# Patient Record
Sex: Male | Born: 1950
Health system: Southern US, Community
[De-identification: ages and names within clinical notes are randomized; demographics above are authoritative.]

## PROBLEM LIST (undated history)

## (undated) DIAGNOSIS — E785 Hyperlipidemia, unspecified: Secondary | ICD-10-CM

## (undated) DIAGNOSIS — R7303 Prediabetes: Secondary | ICD-10-CM

## (undated) DIAGNOSIS — I10 Essential (primary) hypertension: Secondary | ICD-10-CM

## (undated) DIAGNOSIS — I517 Cardiomegaly: Secondary | ICD-10-CM

## (undated) DIAGNOSIS — K5790 Diverticulosis of intestine, part unspecified, without perforation or abscess without bleeding: Secondary | ICD-10-CM

## (undated) DIAGNOSIS — F419 Anxiety disorder, unspecified: Secondary | ICD-10-CM

## (undated) DIAGNOSIS — R9431 Abnormal electrocardiogram [ECG] [EKG]: Secondary | ICD-10-CM

## (undated) DIAGNOSIS — Z8546 Personal history of malignant neoplasm of prostate: Secondary | ICD-10-CM

## (undated) DIAGNOSIS — T7840XA Allergy, unspecified, initial encounter: Secondary | ICD-10-CM

## (undated) HISTORY — DX: Diverticulosis of intestine, part unspecified, without perforation or abscess without bleeding: K57.90

## (undated) HISTORY — DX: Abnormal electrocardiogram (ECG) (EKG): R94.31

## (undated) HISTORY — DX: Anxiety disorder, unspecified: F41.9

## (undated) HISTORY — DX: Allergy, unspecified, initial encounter: T78.40XA

## (undated) HISTORY — DX: Hyperlipidemia, unspecified: E78.5

## (undated) HISTORY — DX: Cardiomegaly: I51.7

## (undated) HISTORY — PX: COLONOSCOPY: SHX174

## (undated) HISTORY — DX: Personal history of malignant neoplasm of prostate: Z85.46

---
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
## 1999-03-10 HISTORY — PX: EYE SURGERY: SHX253

## 1999-10-14 ENCOUNTER — Ambulatory Visit (HOSPITAL_COMMUNITY): Admission: RE | Admit: 1999-10-14 | Discharge: 1999-10-14 | Payer: Self-pay | Admitting: Ophthalmology

## 2000-02-24 ENCOUNTER — Ambulatory Visit (HOSPITAL_COMMUNITY): Admission: RE | Admit: 2000-02-24 | Discharge: 2000-02-24 | Payer: Self-pay | Admitting: Ophthalmology

## 2001-03-09 HISTORY — PX: PROSTATE SURGERY: SHX751

## 2001-11-29 ENCOUNTER — Encounter: Payer: Self-pay | Admitting: Urology

## 2001-12-05 ENCOUNTER — Encounter (INDEPENDENT_AMBULATORY_CARE_PROVIDER_SITE_OTHER): Payer: Self-pay | Admitting: Specialist

## 2001-12-05 ENCOUNTER — Inpatient Hospital Stay (HOSPITAL_COMMUNITY): Admission: RE | Admit: 2001-12-05 | Discharge: 2001-12-08 | Payer: Self-pay | Admitting: Urology

## 2002-04-13 ENCOUNTER — Encounter (INDEPENDENT_AMBULATORY_CARE_PROVIDER_SITE_OTHER): Payer: Self-pay | Admitting: Specialist

## 2002-04-13 ENCOUNTER — Ambulatory Visit (HOSPITAL_COMMUNITY): Admission: RE | Admit: 2002-04-13 | Discharge: 2002-04-13 | Payer: Self-pay | Admitting: Gastroenterology

## 2002-10-03 ENCOUNTER — Ambulatory Visit (HOSPITAL_COMMUNITY): Admission: RE | Admit: 2002-10-03 | Discharge: 2002-10-03 | Payer: Self-pay | Admitting: Ophthalmology

## 2002-10-26 ENCOUNTER — Ambulatory Visit (HOSPITAL_COMMUNITY): Admission: RE | Admit: 2002-10-26 | Discharge: 2002-10-26 | Payer: Self-pay | Admitting: Ophthalmology

## 2005-10-09 ENCOUNTER — Ambulatory Visit: Payer: Self-pay | Admitting: Family Medicine

## 2005-10-16 ENCOUNTER — Ambulatory Visit: Payer: Self-pay | Admitting: Family Medicine

## 2005-12-29 ENCOUNTER — Ambulatory Visit: Payer: Self-pay | Admitting: Family Medicine

## 2006-11-24 ENCOUNTER — Ambulatory Visit: Payer: Self-pay | Admitting: Family Medicine

## 2007-02-18 ENCOUNTER — Ambulatory Visit (HOSPITAL_BASED_OUTPATIENT_CLINIC_OR_DEPARTMENT_OTHER): Admission: RE | Admit: 2007-02-18 | Discharge: 2007-02-18 | Payer: Self-pay | Admitting: Urology

## 2007-10-03 ENCOUNTER — Ambulatory Visit: Payer: Self-pay | Admitting: Family Medicine

## 2008-10-04 ENCOUNTER — Ambulatory Visit: Payer: Self-pay | Admitting: Family Medicine

## 2009-03-09 HISTORY — PX: PENILE PROSTHESIS IMPLANT: SHX240

## 2009-06-20 LAB — HM COLONOSCOPY: HM Colonoscopy: NORMAL

## 2009-07-23 ENCOUNTER — Ambulatory Visit: Payer: Self-pay | Admitting: Family Medicine

## 2009-10-16 ENCOUNTER — Ambulatory Visit: Payer: Self-pay | Admitting: Family Medicine

## 2009-11-13 ENCOUNTER — Ambulatory Visit: Payer: Self-pay | Admitting: Family Medicine

## 2009-11-13 ENCOUNTER — Ambulatory Visit: Payer: Self-pay | Admitting: Cardiovascular Disease

## 2010-05-23 ENCOUNTER — Ambulatory Visit: Payer: Self-pay | Admitting: Cardiovascular Disease

## 2010-06-19 ENCOUNTER — Other Ambulatory Visit: Payer: Self-pay | Admitting: *Deleted

## 2010-06-19 ENCOUNTER — Telehealth: Payer: Self-pay | Admitting: Cardiovascular Disease

## 2010-06-19 DIAGNOSIS — E785 Hyperlipidemia, unspecified: Secondary | ICD-10-CM

## 2010-06-19 NOTE — Telephone Encounter (Signed)
Patient rescheduled appointment, does he need lab work.  Please call patient.  If needed please place order.

## 2010-06-25 ENCOUNTER — Encounter: Payer: Self-pay | Admitting: Cardiovascular Disease

## 2010-06-25 DIAGNOSIS — I517 Cardiomegaly: Secondary | ICD-10-CM

## 2010-06-25 DIAGNOSIS — E785 Hyperlipidemia, unspecified: Secondary | ICD-10-CM | POA: Insufficient documentation

## 2010-06-26 ENCOUNTER — Encounter: Payer: Self-pay | Admitting: Cardiovascular Disease

## 2010-06-27 ENCOUNTER — Other Ambulatory Visit (INDEPENDENT_AMBULATORY_CARE_PROVIDER_SITE_OTHER): Payer: PRIVATE HEALTH INSURANCE | Admitting: *Deleted

## 2010-06-27 ENCOUNTER — Ambulatory Visit (INDEPENDENT_AMBULATORY_CARE_PROVIDER_SITE_OTHER): Payer: PRIVATE HEALTH INSURANCE | Admitting: Cardiovascular Disease

## 2010-06-27 ENCOUNTER — Encounter: Payer: Self-pay | Admitting: Cardiovascular Disease

## 2010-06-27 VITALS — BP 110/70 | HR 53 | Ht 68.0 in | Wt 177.4 lb

## 2010-06-27 DIAGNOSIS — R9431 Abnormal electrocardiogram [ECG] [EKG]: Secondary | ICD-10-CM

## 2010-06-27 DIAGNOSIS — E785 Hyperlipidemia, unspecified: Secondary | ICD-10-CM

## 2010-06-27 DIAGNOSIS — I10 Essential (primary) hypertension: Secondary | ICD-10-CM | POA: Insufficient documentation

## 2010-06-27 LAB — BASIC METABOLIC PANEL
CO2: 29 mEq/L (ref 19–32)
GFR: 84.29 mL/min (ref >60.00)
Glucose, Bld: 96 mg/dL (ref 70–99)
Potassium: 4.2 mEq/L (ref 3.5–5.1)
Sodium: 141 mEq/L (ref 135–145)

## 2010-06-27 LAB — HEPATIC FUNCTION PANEL: Total Bilirubin: 0.8 mg/dL (ref 0.3–1.2)

## 2010-06-27 LAB — LIPID PANEL
HDL: 52.1 mg/dL (ref >39.00)
LDL Cholesterol: 86 mg/dL (ref 0–99)
Total CHOL/HDL Ratio: 3
VLDL: 22.6 mg/dL (ref 0.0–40.0)

## 2010-06-27 MED ORDER — LISINOPRIL 10 MG PO TABS: 10.0000 mg | ORAL_TABLET | Freq: Every day | ORAL | Status: DC

## 2010-06-27 MED ORDER — HYDROCHLOROTHIAZIDE 12.5 MG PO CAPS: 12.5000 mg | ORAL_CAPSULE | Freq: Every day | ORAL | Status: DC

## 2010-06-27 NOTE — Progress Notes (Signed)
Terry Hays Date of Birth  09-21-1950 [facility] / [facility] [address]     [address] [place], [place]  [address] [phone]  Fax  [phone]  History of Present Illness:  Terry Hays is a middle-aged gentleman with a history of hypertension, left ventricular hypertrophy, Hypercholesterolemia and an abnormal EKG.  He's done very well since I last saw him one year ago. He still plays basketball on a regular basis. He's been saying little bit better hydrated and has noted that he is not having nearly as much dizziness. His energy levels have also been better.  Current Outpatient Prescriptions on File Prior to Visit  Medication Sig Dispense Refill  . aspirin 325 MG tablet Take 325 mg by mouth daily.        Marland Kitchen atorvastatin (LIPITOR) 10 MG tablet Take 40 mg by mouth daily.       . Multiple Vitamin (MULTIVITAMIN) tablet Take 1 tablet by mouth daily.        . potassium chloride (KLOR-CON) 10 MEQ CR tablet Take 10 mEq by mouth daily.        Marland Kitchen DISCONTD: hydrochlorothiazide (,MICROZIDE/HYDRODIURIL,) 12.5 MG capsule Take 12.5 mg by mouth daily.        Marland Kitchen DISCONTD: lisinopril (PRINIVIL,ZESTRIL) 10 MG tablet Take 10 mg by mouth daily.         No Known Allergies  Past Medical History  Diagnosis Date  . LVH (left ventricular hypertrophy)   . Dyslipidemia   . Abnormal ECG     History reviewed. No pertinent past surgical history.  History  Smoking status  . Former Smoker  . Quit date: 03/09/1976  Smokeless tobacco  . Not on file    History  Alcohol Use  . 0.6 oz/week  . 1 Glasses of wine per week    Family History  Problem Relation Age of Onset  . Heart attack    . Hyperlipidemia    . Cancer      Reviw of Systems:  Reviewed in the HPI.  All other systems are negative.  Physical Exam: BP 110/70  Pulse 53  Ht 5\' 8"  (1.727 m)  Wt 177 lb 6.4 oz (80.468 kg)  BMI 26.97 kg/m2 The patient is alert and oriented x 3.  The mood and affect are normal.   The skin is warm and dry.  Color is normal.  The HEENT exam reveals that the sclera are nonicteric.  The mucous membranes are moist.  The carotids are 2+ without bruits.  There is no thyromegaly.  There is no JVD.  The lungs are clear.  The chest wall is non tender.  The heart exam reveals a regular rate with a normal S1 and S2.  There are no murmurs, gallops, or rubs.  The PMI is not displaced.   Abdominal exam reveals good bowel sounds.  There is no guarding or rebound.  There is no hepatosplenomegaly or tenderness.  There are no masses.  Exam of the legs reveal no clubbing, cyanosis, or edema.  The legs are without rashes.  The distal pulses are intact.  Cranial nerves II - XII are intact.  Motor and sensory functions are intact.  The gait is normal.  ECG: Sinus bradycardia. He has T wave inversions in the inferior and lateral leads which Assessment / Plan:

## 2010-06-27 NOTE — Assessment & Plan Note (Signed)
We have drawn a lipid level, basic metabolic profile, and hepatic profile today. I'll see him again in one year for follow up visit with the same labs. He is to continue with the Lipitor.

## 2010-06-27 NOTE — Assessment & Plan Note (Signed)
Terry Hays seems to be doing quite well from a blood pressure standpoint. His blood pressure is normal today. He's staying well-hydrated and is not having any episodes of orthostasis. I've refilled his lisinopril and his HCTZ.

## 2010-07-12 ENCOUNTER — Encounter: Payer: Self-pay | Admitting: Family Medicine

## 2010-07-17 ENCOUNTER — Telehealth: Payer: Self-pay | Admitting: *Deleted

## 2010-07-17 NOTE — Telephone Encounter (Signed)
Pt stated his results weren't sent and he didn't hear the results. Mailed copy today,Jodette Warden/ranger

## 2010-07-22 NOTE — Op Note (Signed)
NAME:  Terry Hays, Terry Hays NO.:  [id number]   MEDICAL RECORD NO.:  [id number]          Hays TYPE:  AMB   LOCATION:  [facility]                         FACILITY:  [facility]   PHYSICIAN:  Bertram Millard. Dahlstedt, M.D.DATE OF BIRTH:  [birth year]   DATE OF PROCEDURE:  02/18/2007  DATE OF DISCHARGE:                               OPERATIVE REPORT   PREOPERATIVE DIAGNOSIS:  Erectile dysfunction, organic.   POSTOPERATIVE DIAGNOSIS:  Erectile dysfunction, organic.   PRINCIPAL PROCEDURE:  Placement of three piece inflatable penile  prosthesis, AMS, Ultrex.   SURGEON:  Bertram Millard. Dahlstedt, M.D.   ANESTHESIA:  General with LMA.   COMPLICATIONS:  None.   BRIEF HISTORY:  Terry Hays is Terry 60 year old Hays status post radical  prostatectomy over 4 years ago.  He has been cancer free, but has  significant erectile dysfunction.  He has been reliant on injections for  years.  These are not working quite well enough and Terry Hays is  tiring of them.  He has been talking for Terry couple of years of having an  IPP placed.  He has received significant counseling on this.  Over Terry  past year so, he has been wanting to schedule this.  At this last visit  we had an extensive talk regarding this.  Terry risks and complications of  Terry procedure were discussed with Terry Hays including but not limited  to mechanical malfunction, need for replacement, infection, bleeding,  among others.  He understands these and desires to proceed.   DESCRIPTION OF PROCEDURE:  Terry Hays was identified in Terry holding  area.  He received both preoperative gentamicin and Ancef.  He was taken  to Terry operating room where general anesthetic was administered.  He was  placed in Terry supine position.  Lower abdomen, genitalia and perineum  were scrubbed for 10 minutes and then prepped.  Sterile drapes were  placed.  His urethra was catheterized and his bladder drained with Terry 16  [place] Foley.  An infrapubic incision was then  made approximately 4 cm  long.  Dissection was carried down to Terry rectus fascia superiorly into  Terry base of Terry penis inferiorly.  Terry tunica albuginea of Terry penis was  dissected circumferentially in Terry proximal aspect of Terry anterior  penis.  Terry rectus fascia was divided in Terry midline with electrocautery  and gentle dissection was carried down to Terry space of Retzius.  Terry  space of Retzius was bluntly dissected such that it could easily  accommodate Terry 2 ounce reservoir.  Stay sutures were placed laterally in  Terry proximal corporal bodies with 2 stay sutures placed bilaterally.  In  between these corporotomies were made longitudinally, approximately 1.5  cm in length.  Dissection was carried gently within each corporal body  with Terry Strully scissors.  They were then dilated proximally and  distally up to 13 size using Terry dilators.  Terry dilation was easy.  There was no significant fibrotic reaction.  Terry penis was sized to 19.5  cm with Terry measuring device.  At first an 18 cm implant with Terry 2 cm  exterior extender was placed.  This was felt to be too big, and when I  was removing Terry left cylinder to place Terry 1.5 cm extender, Terry surface  of Terry cylinder was ruptured.  We needed to replace this with Terry 15 cm  cylinder with Terry 4.5 cm rear tip extender.  These were placed  bilaterally.  They fit quite nicely when Terry cylinders were pumped up.  At this point corporotomies were closed with 2-0 Vicryl sutures in an  interrupted fashion.  Terry sub dartos pouch deep into Terry inferior aspect  of Terry right hemiscrotum was created with Terry towel forceps.  Terry pump  was then placed in Terry dependent position in Terry right hemiscrotum.  Terry  good seat was achieved.   At this point Terry reservoir was placed in Terry space of Retzius.  It was  filled with 62 mL of saline and there was no back pressure on Terry  syringe when it was filled.  Terry fascia was closed using Terry running 2-0  Vicryl.  At this point Terry  only connection between Terry tubing from Terry  pump to Terry reservoir was made after Terry tubing was cut appropriately.  Following this, Terry operated area was irrigated copiously with  antibiotic solution.  No significant bleeding was seen coming from  either corporotomy which had been closed with Terry right hemiscrotum.  Terry  tubing was buried in Terry subcutaneous pouch off to Terry right of Terry  incision.  Terry subcutaneous tissue was then closed using Terry running 2-0  Vicryl.  Skin edges were reapproximated using Terry running 4-0 Monocryl and  placed in Terry running subcuticular fashion.  Steri-Strips, Terry Telfa and Terry  Tegaderm dressing was placed.  After Terry Hays was cleansed, Terry  scrotum was dressed with Terry jock strap and fluffs.  Terry Foley catheter  was left in and hooked to dependent drainage.   Terry Hays tolerated Terry procedure well.  He was then taken to Terry PACU  in stable condition.   He will be discharged on Percocet 5/325, #25, 1 p.o. q.4 h p.r.n. pain  as well as Keflex 500 mg 1 p.o. q.8 h #15.  He will follow up in  approximately 1 week.  Activity restrictions were filled out.      Bertram Millard. Dahlstedt, M.D.  Electronically Signed     SMD/MEDQ  D:  02/18/2007  T:  02/19/2007  Job:  [id number]   cc:   Sharlot Gowda, M.D.  Fax: [phone]

## 2010-07-25 NOTE — Op Note (Signed)
NAME:  RECTOR, DEVONSHIRE                       ACCOUNT NO.:  [id number]   MEDICAL RECORD NO.:  [id number]                   PATIENT TYPE:  AMB   LOCATION:  ENDO                                 [facility]   PHYSICIAN:  Petra Kuba, M.D.                 DATE OF BIRTH:  [birth year]   DATE OF PROCEDURE:  04/13/2002  DATE OF DISCHARGE:                                 OPERATIVE REPORT   PROCEDURE:  Colonoscopy with polypectomy.   INDICATIONS FOR PROCEDURE:  A patient with some constipation since his  prostate surgery. Family history of colon polyps, colon cancer, due for  colonic screening. Consent was signed after risks, benefits, methods, and  options were thoroughly discussed in the office.   MEDICINES USED:  Demerol 90, Versed 9.   DESCRIPTION OF PROCEDURE:  Rectal inspection was pertinent for external  hemorrhoids. Digital exam was negative. The pediatric video adjustable  colonoscope was inserted, easily advanced to the proximal level of the  hepatic flexure. At that point, a small polyp was seen which was snared.  Unfortunately before we could apply cautery, we cut through the polyp and it  was suctioned through the scope and collected in the trap. There was really  minimal bleeding so we elected to advance to the cecum which required  rolling him on his back and some abdominal pressure. No other abnormalities  were seen on insertion. The cecum was identified by the appendiceal orifice  and the ileocecal valve. The prep was adequate. There was some liquid stool  that required washing and suctioning. The scope was slowly withdrawn. In the  ascending colon, a small polyp was seen and was hot biopsied. We withdrew  back to the previous polypectomy site and no obvious residual polypoid  tissue was seen but we went ahead and took one hot biopsy of that area. Also  in the hepatic flexure, another small polyp was seen and was hot biopsied  x1. As the slow was slowly withdrawn, a  few scattered tiny polyps were seen.  We took additional hot biopsies in the distal transverse, descending and  sigmoid. There was a rare occasional left sided diverticula seen. Once back  in the rectum, another small polyp was seen and we elected to snare this  one. Electrocautery was applied and the polyp was suctioned through the  scope and collected in the trap. Also in the rectum was some minimal  inflammation over what seemed to be the prostate area but no obvious mass,  lesion or anything worrisome that required biopsy. The scope was retroflexed  revealing some internal hemorrhoids. The scope was straightened and  readvanced a short ways up the left side of the colon, air was suctioned,  scope removed. The patient tolerated the procedure well and there was no  obvious or immediate complication.   ENDOSCOPIC DIAGNOSIS:  1. Internal and external hemorrhoids.  2. Occasional left sided  diverticula.  3. Minimal rectal inflammation presumably over the prostate from his     previous surgery.  4. Multiple tiny to small polyps with snares in the hepatic flexure and the     rectum and hot biopsies in the ascending, transverse, descending and     sigmoid.  5. Otherwise within normal limits to the cecum.    PLAN:  Await pathology to determine future colonic screening. Probably  recheck in three check. Happy to see back p.r.n., otherwise, return care to  Dr. Excell Seltzer for the customary health care maintenance to include yearly  rectals and guaiacs.                                               Petra Kuba, M.D.    MEM/MEDQ  D:  04/13/2002  T:  04/13/2002  Job:  [id number]   cc:   Christella Noa, M.D.  [address] [place]  [place], [place] [address]  Fax: [phone]

## 2010-07-25 NOTE — Op Note (Signed)
NAME:  Terry Hays, Terry Hays                       ACCOUNT NO.:  [id number]   MEDICAL RECORD NO.:  [id number]                   PATIENT TYPE:  INP   LOCATION:  X002                                 [facility]ACILITY:  [facility]   PHYSICIAN:  Bertram Millard. Dahlstedt, M.D.          DATE OF BIRTH:  01-25-1951   DATE OF PROCEDURE:  12/05/2001  DATE OF DISCHARGE:                                 OPERATIVE REPORT   PREOPERATIVE DIAGNOSIS:  Adenocarcinoma of the prostate.   POSTOPERATIVE DIAGNOSIS:  Adenocarcinoma of the prostate.   PROCEDURES:  1. Radical retropubic prostatectomy.  2. Bilateral lymphadenectomy.   SURGEON:  Bertram Millard. Retta Diones, M.D.   ASSISTANT:  Crecencio Mc, M.D.   ANESTHESIA:  General.   ESTIMATED BLOOD LOSS:  2000 cc.   INTRAVENOUS FLUIDS:  6900 cc of crystalloid.   COMPLICATIONS:  Rectal injury which was primarily repaired.   SPECIMENS:  1. Prostate with bilateral seminal vesicals.  2. Bilateral obturator lymph nodes.   INDICATION:  This is a 60 year old gentleman, who was initially seen and  evaluated for lower urinary tract symptoms.  The patient also was noted to  have an elevated PSA which was 4.7.  A repeat PSA was found to be 6.17.  He  subsequently underwent prostate biopsy and was found to have Gleason 3 + 3 =  6 in approximately 10% of the tissue on the left-sided biopsies.  In  addition, the patient was noted to a have prostate gland of approximately 90  cc.  He was therefore placed on Proscar at that time.  After discussing  treatment options with the patient, he elected to proceed with surgical  therapy.  Therefore, potential risks and benefits of radical retropubic  prostatectomy were discussed with the patient, and he consented.   DESCRIPTION OF PROCEDURE:  The patient was taken to the operating room, and  a general anesthetic was administered.  The patient was given preoperative  antibiotics, placed in the supine position, and prepped and draped in the  usual  sterile fashion.  Next a 24 French Foley catheter was inserted into  the bladder, and the bladder was drained.  The #10 blade was then used to  make a lower midline abdominal incision from just below the umbilicus down  to the pubic symphysis.  This was then carried down through the subcutaneous  tissues with Bovie electrocautery.  The anterior rectus fascia was  identified and divided in the midline.  The rectus muscles were then  separated in the midline, and the transversalis fascia was opened, thus  exposing the space of Retzius.  The space was then developed on either side  of the bladder in preparation for a lymphadenectomy.  A self-retracting  Bookwalter was placed and pelvic lymphadenectomy was begun.  Attention was  first turned to the obturator lymph nodes on the left side which were  removed with sharp and blunt dissection as necessary with care to clip any  vessels or lymphatics.  Care was also taken not to injure the obturator  nerve.  Lymphadenectomy was also performed on the contralateral side in a  similar fashion.  Attention then turned to the prostatectomy.  The  endopelvic fascia was identified on either side of the prostate and sharply  entered with Metzenbaum scissors.  The levator fibers were then bluntly  dissected from the prostate.  The endopelvic fascia was taken down with  electrocautery, and the puboprostatic ligaments were then identified.  The  Kitner dissector was used to tease the overlying fat away, and the  puboprostatics were sharply divided just under the pubic bone.  Attention  then turned to controlling the dorsal venous complex.  A Hohenfellner was  placed between the anterior urethra and the dorsal venous  complex, and two  #1 Vicryl ties were placed on the dorsal vein.  In addition, a 2-0 Vicryl  suture ligature was placed over the dorsal vein.  An additional 2-0 Vicryl  was also placed proximally as a backbleeding stitch.  The dorsal venous  complex  was then taken down with electrocautery.  The anterior urethra was  thus exposed and was sharply divided with Metzenbaum scissors, thereby  exposing the Foley catheter.  A right-angle was placed around the catheter,  and it was cut distally and brought into the incision, thereby exposing the  posterior urethra.  The posterior urethra was also sharply divided with  Metzenbaum scissors, and the space between the anterior rectal fascia and  Denonvillier's fascia was bluntly separated.  A good plane did not easily  develop.  Attention turned toward taking down some of the lateral pedicle  tissue, thereby exposing the space between the prostate and rectum.  At this  point, it was noted that there was yellowish-brown material in the wound.  Inspection revealed a small rectal injury.  Of note, the patient had  received a bowel prep prior to the procedure.  A dose of Flagyl 500 mg was  administered to the patient at this time, and continuation of the  prostatectomy ensued.  The lateral pedicle tissue was taken down in a  piecemeal fashion with a right-angle.  This tissue was then clipped and  divided as the prostate was dissected free.  Attention then turned to the  bladder neck which was carefully dissected using a Vanderbilt clamp.  The  bladder neck was able to be nicely dissected free and once isolated, it was  divided with a #15 blade.  It was first divided anteriorly, and the trigone  of the bladder was then identified and was seen to be well away from the  bladder neck.  Therefore, the posterior urethra was also divided sharply,  thereby severing the prostate and the bladder.  Attention then turned to  dissecting out the vas deferens on either side.  A right-angle was used to  accomplish this, and the vas was then clipped and divided.  The seminal  vesicals were then also dissected free using a right-angle, and they were clipped toward the tip of the seminal vesicals and divided as well.   Specimen was then removed off the table, and the wound was inspected.  Any  additional bleeding sites were controlled to achieve good hemostasis.  The  rectal injury was again examined and appeared to be proximal to where the  urethral anastomosis was to be performed.  Since the patient had had an  adequate bowel prep, it was decided to perform a primary closure of  this.  Therefore, a 2-0 Vicryl stitch was used to close the rectal laceration.  In  addition, a 2-0 Vicryl was used to bring a serosal layer over the initial  layer.  There was also some available tissue just anterior to this which was  also brought together with a 2-0 Vicryl to separate the rectal laceration  from the urethral anastomosis.  Attention then turned to reconstruction of  the bladder neck.  The bladder neck was everted with interrupted 4-0 chromic  sutures.  A 2-0 chromic suture was used to reconstruct the posterior bladder  neck in a tennis racquet fashion so that it was of the appropriate size for  the anastomosis.  Then 2-0 PDS sutures were then used for the urethral  anastomosis.  A total of five anastomotic sutures were placed at the 12, 2,  5, 7, and 10 o'clock positions in the urethra and then brought through their  corresponding positions in the bladder neck.  Of note, copious antibiotic  irrigation was used to irrigate the wound following closure of the rectal  laceration.  The anastomotic sutures were then tied down, and the bladder  was irrigated and irrigated clear without any clots.  A #10 Blake drain was  then placed in the wound and brought out through a separate stab incision in  the left lower quadrant.  The fascia was then brought together with a #1  PDS, and the skin was reapproximated with a running 4-0 PDS subcuticular  stitch.  A dressing was placed, and the procedure was ended.  The patient appeared to tolerate the procedure well and was able to be  extubated and transferred to the recovery unit  in satisfactory condition.  Please note that Dr. Marcine Matar was the operating surgeon and was  present and participated for the entire procedure.     Crecencio Mc, M.D.                          Bertram Millard. Dahlstedt, M.D.    LB/MEDQ  D:  12/05/2001  T:  12/05/2001  Job:  [id number]   cc:   Talmadge Coventry, M.D.

## 2010-07-25 NOTE — Discharge Summary (Signed)
   NAME:  Terry Hays, DISPENZA NO.:  [id number]   MEDICAL RECORD NO.:  [id number]                   PATIENT TYPE:   LOCATION:                                       FACILITY:   PHYSICIAN:  Bertram Millard. Dahlstedt, M.D.          DATE OF BIRTH:   DATE OF ADMISSION:  12/05/2001  DATE OF DISCHARGE:  12/08/2001                                 DISCHARGE SUMMARY   PRINCIPAL DIAGNOSES:  1. Adenocarcinoma of the prostate, pathologic stage T2a, NO, MX, Gleason     6/10 (3+3).  2. Hypercholesterolemia.  3. Benign prostatic hypertrophy.   PRINCIPAL PROCEDURE:  December 05, 2001, radical prostatectomy, bilateral  pelvic lymph node dissection, repair of small rectal laceration.   BRIEF HISTORY:  This 60 year old gentleman has adenocarcinoma of the  prostate.  This was discovered upon evaluation for an elevated PSA.  The  patient has a 60- to 70-gram gland.  He was counseled regarding treatment  strategies and has chosen radical prostatectomy.  He was admitted for that  purpose.   ADMISSION LABORATORY DATA:  Admission laboratories were all normal.   EKG normal.   Chest x-ray normal.   HOSPITAL COURSE:  The patient was admitted directly to the operating room  where he underwent radical retropubic prostatectomy, bilateral pelvic lymph  node dissection.  A small rectal laceration was noted during the procedure  and was closed primarily.  He was placed on a clear liquid diet and  eventually advanced to full liquids.  He did well postoperatively and was  discharged home on postoperative day #3.  He did have a low hematocrit which  was followed without transfusion.  He was discharged home on postoperative  day #3.  At that time he was eating a full liquid diet and was discharged  home with catheter in place.  JP drain had been removed.  He will follow up  in my office in approximately one week.   DISCHARGE MEDICATIONS:  Oxybutynin for bladder spasms.  Cipro and Flagyl.  Vicodin for pain.  He was also told to take a stool softener.   DISCHARGE INSTRUCTIONS:  He will advance his diet at home.  Activity  restrictions were discussed in depth with the patient.                                                Bertram Millard. Dahlstedt, M.D.   SMD/MEDQ  D:  01/11/2002  T:  01/11/2002  Job:  [id number]   cc:   Talmadge Coventry, M.D.

## 2010-07-25 NOTE — Op Note (Signed)
[place]  Patient:    Terry Hays, Terry Hays                    MRN: [id number] Proc. Date: 02/24/00 Adm. Date:  [id number] Attending:  Ivor Messier CC:         Vincenza Hews, M.D.  Heather Roberts, M.D.   Operative Report  PREOPERATIVE DIAGNOSIS:  Posterior subcapsular cataract, left eye.  POSTOPERATIVE DIAGNOSIS:  Posterior subcapsular cataract, left eye.  OPERATION:  Planned extracapsular cataract extraction, with primary insertion of posterior chamber chamber intraocular lens implant.  SURGEON:  Guadelupe Sabin, M.D.  ASSISTANT:  Nurse.  ANESTHESIA:  Local 4% Xylocaine, 0.75% Marcaine, retrobulbar block, topical tetracaine, intraocular Xylocaine.  The patient was given sodium pentothal intravenously during the period of retrobulbar block.  DESCRIPTION OF PROCEDURE:  After the patient was prepped and draped, a lid speculum was inserted in the left eye.  The eye was turned downward and a superior rectus traction suture was placed.  Schiotz tonometry was recorded at 8-9 scale units with a 5.5 g weight.  A peritomy was performed adjacent to the limbus from the 11 to the 1 oclock position.  The corneal-scleral junction was cleaned, and a corneal-scleral groove made with a 45-degree Super blade. The anterior chamber was then entered with the 2.5 mm diamond keratome at the 12 oclock position, and the 15-degree blade at the 2:30 position.  Using a bent #26 gauge needle, a circular capsular rhexis was performed.  The capsule was extremely thick and a small fragment was cut with the Vannas scissors to complete the circular capsular rhexis.  Hydrodissection and hydrodelineation were performed using 1% Xylocaine.  The 30-degree phacoemulsification tip was then inserted, with slow controlled emulsification of the lens nucleus.  The total ultrasonic time was 32 seconds.   Average power level was 9%.  Total amount of fluid used was 45 cc.   Following the removal of the nucleus, the residual cortex and posterior subcapsular plaque were aspirated with the irrigation aspiration tip.  The posterior capsule itself appeared intact, with a brilliant red fundus reflex.  It was therefore elected to insert an [employer] S40NB silicone posterior chamber intraocular lens implant with UV absorber, diopter strength +18.50.  This was inserted with the McDonald forceps into the anterior chamber, and then centered into the capsular bag using the [facility] lens rotator.  The lens appeared to be well-centered.  The Healon which had been used during the procedure was then aspirated and replaced with balanced salt solution and Miochol ophthalmic solution.  The operative incisions appeared to be self-sealing, and no sutures were required. A light patch and protector shield, along with Maxitrol ointment were all applied to the operated left eye.  The duration of the procedure and the anesthesia administration was 45 minutes.  The patient tolerated the procedure well in general, and left the operating room for the recovery room in good condition. DD:  02/24/00 TD:  02/24/00 Job: [id number]

## 2010-07-25 NOTE — Op Note (Signed)
[place]  Patient:    Terry Hays, Terry Hays                    MRN: [id number] Proc. Date: 10/14/99 Adm. Date:  [id number] Attending:  Ivor Messier CC:         Vincenza Hews, M.D.             Heather Roberts, M.D.                           Operative Report  PREOPERATIVE DIAGNOSIS:  Posterior subcapsular cataract, right eye.  POSTOPERATIVE DIAGNOSIS:  Posterior subcapsular cataract, right eye.  OPERATION:  Planned extracapsular cataract extraction  - phacoemulsification, primary insertion of posterior chamber intraocular lens implant.  SURGEON:  Guadelupe Sabin, M.D.  ASSISTANT:  Nurse.  ANESTHESIA:  Local 4% Xylocaine, 0.75% Marcaine, retrobulbar block, topical tetracaine, intraoperative Xylocaine.  DESCRIPTION OF PROCEDURE:  After the patient was prepped and draped with suitable local anesthesia, a lid speculum was inserted in the right eye. Schiotz tonometry was recorded at 7 scale units with a 5.5 g weight.  A peritomy was performed adjacent to the limbus from the 11 to 1 oclock position.  The corneal-scleral junction was cleaned, and a corneal-scleral groove made with a 45-degree Super blade.  The anterior chamber was then entered with a 2.5 mm diamond keratome at the 12 oclock position, and the 15-degree blade at the 2:30 position.  Using a bent #26 gauge needle on a Healon syringe, an anterior capsulotomy was begun.  The anterior capsule itself was extremely difficult to perforate.  A capsular rhexis, however, was completed.  Hydrodissection and hydrodelineation were performed using 1% Xylocaine. The 30-degree phacoemulsification tip was then inserted with slow controlled emulsification of the rather soft nucleus.  Total ultrasonic time was 45 seconds.  Average power level 12%.  Amount of fluid used during the emulsification was 55 cc.  The posterior capsular plaque easily was removed from the posterior capsular surface.   The posterior capsule appeared intact with a brilliant red fundus reflex.  It was therefore elected to insert an [employer] SI40NB silicone posterior chamber intraocular lens implant with UV absorber, diopter strength +18.00.  This was inserted with a McDonald forceps into the anterior chamber, and then centered into the capsular bag using the [facility] lens rotator.  Following this, the Healon which had been used during the procedure was removed.  There suddenly appeared to be oozing blood from the 2 oclock position.  Miochol ophthalmic solution was used to constrict the pupil.  Balanced salt solution was used to irrigate the anterior chamber.  The irrigation/aspiration tip was again inserted into the anterior chamber, and the anterior chamber cleared, but there appeared to be continued oozing, perhaps related to the patients chronic aspirin administration, although he stated that he had discontinued it a few days prior to surgery.  It was elected to close the 12 oclock incision with a single #10-0 interrupted nylon suture.  There continued to be slight oozing of the blood in the anterior chamber, and the anterior chamber pressure was elevated slightly for a few moments, to cause hopefully the blood to stop bleeding.  There did, however, remain some diffuse blood in the anterior chamber which obscured anterior segment details.  It was felt, however, that continued irrigation was not improving the situation, and it was elected to close, hoping that the blood would spontaneously  stop bleeding.  Maxitrol ointment was instilled in the conjunctival cul-de-sac, and a light patch and protector shield applied.  Duration of the procedure and anesthesia administration 45 minutes.  The patient tolerated the procedure well in general and left the operating room for the recovery room in good condition. DD:  10/14/99 TD:  10/14/99 Job: [id number]

## 2010-07-25 NOTE — H&P (Signed)
[place]  Patient:    Terry Hays, Terry Hays                    MRN: [id number] Adm. Date:  [id number] Attending:  Ivor Messier CC:         Vincenza Hews, M.D. - & Lab data, EKG, chest x-ray  Heather Roberts, M.D.   History and Physical  CHIEF COMPLAINT:  This was a planned outpatient readmission of this 60 year old white male, admitted for cataract implant surgery of the left eye.  HISTORY OF PRESENT ILLNESS:  This patient had been noted by his regular ophthalmologist, Dr. Vincenza Hews, to have progressive posterior subcapsular cataract formation in both eyes.  The patient was previously admitted to this hospital on October 14, 1999, where cataract implant surgery was performed.  The patient did well following this surgery, with a return of vision to 20/20, without correction.  The patient was eager to proceed with similar cataract implant surgery of the left eye.  He signed an informed consent and arrangements were made for his outpatient admission at this time.  PAST MEDICAL HISTORY:  See the old chart.  He is in stable general health.  REVIEW OF SYSTEMS:  No cardiorespiratory complaints.  PHYSICAL EXAMINATION:  VITAL SIGNS:  As recorded on admission, blood pressure 133/80, pulse 60, respirations 18, temperature 97.6 degrees.  GENERAL:  The patient is a pleasant well-developed, well-nourished white male, in no acute distress.  HEENT:  Eyes:  Visual acuity 20/20 right eye, 20/30 left eye, with best correction.  Slit lamp examination reveals a posterior subcapsular, and a dense plaque in the center of the pupil of the left eye.  CHEST:  Lungs clear to percussion and auscultation.  HEART:  A normal sinus rhythm, no cardiomegaly, no murmurs.  ABDOMEN:  Negative.  EXTREMITIES:  Negative.  ADMISSION DIAGNOSES: 1. Posterior subcapsular cataract, left eye. 2. Pseudophakia, right eye.  SURGICAL PLAN:  Cataract implant  surgery, left eye. DD:  02/24/00 TD:  02/24/00 Job: [id number]

## 2010-07-25 NOTE — H&P (Signed)
[place]  Patient:    Terry Hays, Terry Hays                    MRN: [id number] Adm. Date:  [id number] Attending:  Ivor Messier CC:         Vincenza Hews, M.D.             Heather Roberts, M.D.                         History and Physical  CHIEF COMPLAINT:  This was a planned outpatient surgical admission of this 60 year old white male admitted for cataract implant surgery of the right eye.  HISTORY OF PRESENT ILLNESS:  This patient had noted the gradual onset of blurred vision in the right eye, especially on sunny or dim days, and was found by Dr. Vincenza Hews, his regular ophthalmologist, to have cataract formation in both eyes, right eye worse than left eye.  It was felt that cataract implant surgery would be helpful, even though visual acuity as recorded on the chart was not significantly reduced.  The patient was referred to my office for cataract surgery.  Initial examination there revealed a visual acuity without correction of 20/60 right eye, 20/100 left eye, and with correction 20/40 right eye, and 20/20 left eye with correction.  Slit lamp examination showed a posterior subcapsular and nuclear cataract formation in both eyes.  Fundus examination dilated with Mydriacyl was normal.  It was felt that this patient was having considerable difficulty with his subjective vision, and that cataract surgery was indicated, due to the posterior subcapsular location of the cataract.  PAST MEDICAL HISTORY:  The patient is under the care of Dr. Heather Roberts. He is felt by Dr. Orson Aloe to be of a low risk for the proposed surgery.  FAMILY HISTORY:  The patient states that his family history is significant for heart attacks occurring at an early age.  CURRENT MEDICATIONS:  The patient has been taking aspirin up until a few days prior to surgery, and Lipitor, under Dr. Arvella Nigh care.  REVIEW OF SYSTEMS:  No cardiorespiratory  complaints.  PHYSICAL EXAMINATION:  VITAL SIGNS:  Blood pressure 111/62, temperature 96.5 degrees, pulse 54, respirations 20.  GENERAL:  The patient is a healthy well-developed, well-nourished white male, in no acute distress.  HEENT:  Eyes:  Ocular examination as noted above.  CHEST:  Lungs clear to percussion and auscultation.  HEART:  Normal sinus rhythm, no cardiomegaly, no murmurs.  ABDOMEN:  Negative.  EXTREMITIES:  Negative.  ADMISSION DIAGNOSES:  Posterior subcapsular cataract, both eyes.  SURGICAL PLAN:  Planned extracapsular cataract extraction with primary insertion of posterior chamber intraocular lens implant. DD:  10/14/99 TD:  10/14/99 Job: [id number]

## 2010-07-25 NOTE — H&P (Signed)
NAME:  Terry Hays, Terry Hays                       ACCOUNT NO.:  [id number]   MEDICAL RECORD NO.:  [id number]                   PATIENT TYPE:  INP   LOCATION:  NA                                   FACILITY:  [facility]   PHYSICIAN:  Bertram Millard. Dahlstedt, M.D.          DATE OF BIRTH:  November 12, 1950   DATE OF ADMISSION:  12/05/2001  DATE OF DISCHARGE:                                HISTORY & PHYSICAL   REASON FOR ADMISSION:  Prostate cancer.   HISTORY OF PRESENT ILLNESS:  The patient is a 60 year old gentleman who was  referred to me approximately two months ago for evaluation and management of  urinary symptoms and elevated PSA.  The patient's IPSS score at the time was  22.  His PSA was 4.7, and on repeat in the office was 6.17.  He underwent  ultrasound and biopsy on 10/03/01.  It found that the patient had a glandular  volume of 90 cc.  He had adenocarcinoma on 10% on the biopsy tissue on the  left with Gleason 6:10 (3+3).  Since that time, he has been on Flomax and  also has been placed on Proscar.  His urinary symptoms have improved  tremendously.   With the patient's diagnosis of prostate cancer, treatment options were  discussed with him.  This 60 year old gentleman is healthy and has treatment  only for hypercholesterolemia.  Treatment options, including watchful  waiting, cryotherapy, radiation, hormonal ablation, and surgery were  discussed.  Due to the patient's age and health status, surgery was the  primary recommendation.  Risks and complications of all treatment strategies  were discussed with the patient and his wife in depth.  After consideration,  they have chosen to have surgery.  He presents at this time for that  procedure.   PAST MEDICAL HISTORY:  Hypercholesterolemia.   PAST SURGICAL HISTORY:  1. Bilateral cataract surgery.  2. Herniorrhaphy.   MEDICATIONS:  1. Lipitor 10 mg q.d.  2. Aspirin, recently discontinued.  3. Flomax.  4. Proscar.   SOCIAL HISTORY:  He  is married and has two children.  He stopped smoking in  1977.  He drinks Cognac nightly.  He plays basketball regularly.  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  He is a native of [place], [place].   FAMILY HISTORY:  Significant for lung cancer, heart disease, hypertension,  and melanoma.   REVIEW OF SYMPTOMS:  Significant for occasional constipation.  He has recent  urinary symptoms which are improved.   PHYSICAL EXAMINATION:  GENERAL:  A vigorous, healthy-appearing adult male.  VITAL SIGNS:  As recorded in chart.  HEENT:  Normal.  NECK:  Supple without thyromegaly or adenopathy.  CHEST:  Clear bilaterally.  HEART:  Regular rate and rhythm.  ABDOMEN:  Flat, soft, nontender, nondistended, no hepatosplenomegaly or  masses.  GENITOURINARY:  Phallus without lesions, fibrotic areas, or plaques.  Testicles down bilaterally.  Normal shape, size, and consistency.  Perianal, perineal, and scrotal skin  are unremarkable.  Normal anal sphincter tone.  Glands 3+.  No tenderness,  no nodularity, no rectal mass.  SV is nonpalpable.  EXTREMITIES:  Peripheral vascular examination is normal.  NEUROLOGIC:  Grossly intact.   LABORATORY DATA:  EKG, chest x-ray, and other laboratory admission data are  pending at the time of dictation.   IMPRESSION:  1. Prostate cancer, clinic stage T1C, Gleason 6:10 (3+3).  The patient is     admitted for radical prostatectomy.  2. Benign prostatic hypertrophy, treated with alpha blockers and Proscar.  3. Hypercholesterolemia.   PLAN:  Admit for surgery.                                                  Bertram Millard. Dahlstedt, M.D.    SMD/MEDQ  D:  12/02/2001  T:  12/02/2001  Job:  [id number]   cc:   Talmadge Coventry, M.D.

## 2010-08-14 ENCOUNTER — Other Ambulatory Visit: Payer: Self-pay | Admitting: *Deleted

## 2010-08-14 ENCOUNTER — Telehealth: Payer: Self-pay | Admitting: *Deleted

## 2010-08-14 ENCOUNTER — Encounter: Payer: Self-pay | Admitting: Family Medicine

## 2010-08-14 NOTE — Telephone Encounter (Signed)
Done

## 2010-11-30 ENCOUNTER — Other Ambulatory Visit: Payer: Self-pay | Admitting: Family Medicine

## 2010-12-10 ENCOUNTER — Telehealth: Payer: Self-pay | Admitting: Family Medicine

## 2010-12-10 NOTE — Telephone Encounter (Signed)
Renew his Xanax 

## 2010-12-11 ENCOUNTER — Other Ambulatory Visit: Payer: Self-pay

## 2010-12-11 MED ORDER — ALPRAZOLAM 0.25 MG PO TABS: 0.2500 mg | ORAL_TABLET | Freq: Every evening | ORAL | Status: DC | PRN

## 2010-12-11 NOTE — Telephone Encounter (Signed)
Called in med per jcl 

## 2010-12-11 NOTE — Telephone Encounter (Signed)
Called med in 

## 2010-12-15 LAB — POCT HEMOGLOBIN-HEMACUE
Hemoglobin: 15.7
Operator id: 118191

## 2011-01-04 ENCOUNTER — Other Ambulatory Visit: Payer: Self-pay | Admitting: Cardiology

## 2011-01-05 ENCOUNTER — Other Ambulatory Visit: Payer: Self-pay | Admitting: Family Medicine

## 2011-01-06 ENCOUNTER — Telehealth: Payer: Self-pay | Admitting: Family Medicine

## 2011-01-06 NOTE — Telephone Encounter (Signed)
Pt is schedule next Thursday the 8th for a med check

## 2011-01-12 NOTE — Telephone Encounter (Signed)
1 WK OLD.  CLOSED ENCOUNTER

## 2011-01-15 ENCOUNTER — Encounter: Payer: Self-pay | Admitting: Family Medicine

## 2011-01-15 ENCOUNTER — Ambulatory Visit (INDEPENDENT_AMBULATORY_CARE_PROVIDER_SITE_OTHER): Payer: PRIVATE HEALTH INSURANCE | Admitting: Family Medicine

## 2011-01-15 DIAGNOSIS — I10 Essential (primary) hypertension: Secondary | ICD-10-CM

## 2011-01-15 DIAGNOSIS — E785 Hyperlipidemia, unspecified: Secondary | ICD-10-CM

## 2011-01-15 DIAGNOSIS — I517 Cardiomegaly: Secondary | ICD-10-CM

## 2011-01-15 MED ORDER — ATORVASTATIN CALCIUM 40 MG PO TABS: 40.0000 mg | ORAL_TABLET | Freq: Every day | ORAL | Status: DC

## 2011-01-15 NOTE — Progress Notes (Signed)
  Subjective:    Patient ID: Terry Hays, male    DOB: 12-30-1950, 60 y.o.   MRN: [id number]  HPI He is here for recheck. He continues on medications listed in the chart. He still does travel a lot and uses Xanax to help with her flying. He continues on Lipitor as well as his HCTZ and is having no difficulty with this. He continues to remain quite active and plays basketball regularly.   Review of Systems     Objective:   Physical Exam Alert and in no distress otherwise not examined       Assessment & Plan:   1. Dyslipidemia   2. Hypertension   3. LVH (left ventricular hypertrophy)    continue on present medications. Return here in several months for a complete exam.

## 2011-01-26 ENCOUNTER — Telehealth: Payer: Self-pay | Admitting: Family Medicine

## 2011-01-26 MED ORDER — ALPRAZOLAM 0.25 MG PO TABS: 0.2500 mg | ORAL_TABLET | Freq: Every evening | ORAL | Status: DC | PRN

## 2011-01-26 NOTE — Telephone Encounter (Signed)
Refilled xanax #[phone] with no refills.   

## 2011-01-26 NOTE — Telephone Encounter (Signed)
Renew his Xanax 

## 2011-04-14 ENCOUNTER — Ambulatory Visit (INDEPENDENT_AMBULATORY_CARE_PROVIDER_SITE_OTHER): Payer: PRIVATE HEALTH INSURANCE | Admitting: Medical

## 2011-04-14 ENCOUNTER — Encounter: Payer: Self-pay | Admitting: Medical

## 2011-04-14 VITALS — BP 140/70 | HR 62 | Temp 98.0°F | Resp 16 | Wt 180.0 lb

## 2011-04-14 DIAGNOSIS — K612 Anorectal abscess: Secondary | ICD-10-CM

## 2011-04-14 DIAGNOSIS — K611 Rectal abscess: Secondary | ICD-10-CM | POA: Insufficient documentation

## 2011-04-14 DIAGNOSIS — K644 Residual hemorrhoidal skin tags: Secondary | ICD-10-CM | POA: Insufficient documentation

## 2011-04-14 MED ORDER — SULFAMETHOXAZOLE-TRIMETHOPRIM 800-160 MG PO TABS: 1.0000 | ORAL_TABLET | Freq: Two times a day (BID) | ORAL | Status: AC

## 2011-04-14 MED ORDER — HYDROCORTISONE 2.5 % RE CREA: TOPICAL_CREAM | Freq: Two times a day (BID) | RECTAL | Status: DC

## 2011-04-14 NOTE — Patient Instructions (Signed)
Peri-Rectal Abscess Your caregiver has diagnosed you as having a SMALL peri-rectal abscess. This is an infected area near the rectum that is filled with pus. If the abscess is near the surface of the skin, your caregiver may open (incise) the area and drain the pus. HOME CARE INSTRUCTIONS   If your abscess was opened up and drained. A small piece of gauze may be placed in the opening so that it can drain. Do not remove the gauze unless directed by your caregiver.   A loose dressing may be placed over the abscess site. Change the dressing as often as necessary to keep it clean and dry.   After the drain is removed, the area may be washed with a gentle antiseptic (soap) four times per day.   A warm sitz bath, warm packs or heating pad may be used for pain relief, taking care not to burn yourself.   Return for a wound check in 1 day or as directed.   An "inflatable doughnut" may be used for sitting with added comfort. These can be purchased at a drugstore or medical supply house.   To reduce pain and straining with bowel movements, eat a high fiber diet with plenty of fruits and vegetables. Use stool softeners as recommended by your caregiver. This is especially important if narcotic type pain medications were prescribed as these may cause marked constipation.   Only take over-the-counter or prescription medicines for pain, discomfort, or fever as directed by your caregiver.  SEEK IMMEDIATE MEDICAL CARE IF:   You have increasing pain that is not controlled by medication.   There is increased inflammation (redness), swelling, bleeding, or drainage from the area.   An oral temperature above 102 F (38.9 C) develops.   You develop chills or generalized malaise (feel lethargic or feel "washed out").   You develop any new symptoms (problems) you feel may be related to your present problem.  Document Released: 02/21/2000 Document Revised: 11/05/2010 Document Reviewed: 02/21/2008 [facility]  Patient Information 2012 [place], [place].

## 2011-04-14 NOTE — Progress Notes (Signed)
Subjective:   HPI  Terry Hays is a 61 y.o. male who presents with sore bottom.  He is a very active man, exercises routinely, has been playing basketball recently. He notes last week when playing basketball, he ended up being blocked when shooting the ball and was thrown to the ground, landed on his bottom. This happened again a few days later. Since then he has had significant pain along his bottom around the anus area. Today he felt a pimple and saw some blood on tissue in the area that is painful and decided to come in to be evaluated.  He does have a history of hemorrhoids, uses his wife steroid cream for this occasionally. He does also note a history of boils mainly on his face and neck, but no prior boil on his bottom.  No other aggravating or relieving factors.    No other c/o.  The following portions of the patient's history were reviewed and updated as appropriate: allergies, current medications, past family history, past medical history, past social history, past surgical history and problem list.  Past Medical History  Diagnosis Date  . LVH (left ventricular hypertrophy)   . Dyslipidemia   . Abnormal ECG   . Allergy   . Anxiety     FLYING  . Hyperlipidemia   . Diverticulosis   . History of prostate cancer     No Known Allergies  Current Outpatient Prescriptions on File Prior to Visit  Medication Sig Dispense Refill  . ALPRAZolam (XANAX) 0.25 MG tablet Take 1 tablet (0.25 mg total) by mouth at bedtime as needed.  60 tablet  0  . aspirin 325 MG tablet Take 325 mg by mouth daily.        Marland Kitchen atorvastatin (LIPITOR) 40 MG tablet Take 1 tablet (40 mg total) by mouth daily.  30 tablet  5  . hydrochlorothiazide (,MICROZIDE/HYDRODIURIL,) 12.5 MG capsule Take 1 capsule (12.5 mg total) by mouth daily.  90 capsule  3  . KLOR-CON M10 10 MEQ tablet TAKE 1 TABLET EVERY DAY  30 tablet  5  . lisinopril (PRINIVIL,ZESTRIL) 10 MG tablet Take 1 tablet (10 mg total) by mouth daily.  90  tablet  3  . mometasone (NASONEX) 50 MCG/ACT nasal spray Place 2 sprays into the nose daily.           Review of Systems Constitutional: denies fever, chills, sweats Gastroenterology: denies abdominal pain, nausea, vomiting, diarrhea, constipation  Musculoskeletal: denies arthralgias, myalgias, joint swelling, back pain Urology: denies dysuria, difficulty urinating, hematuria, urinary frequency, urgency Neurology: no weakness, tingling, numbness      Objective:   Physical Exam  General appearance: alert, no distress, WD/WN, white male, lean Rectal: 1 left larger external hemorrhoids, but a few smaller left sided external hemorrhoids.  There is a small 4mm raised tender, lesion that is draining pus.  There is not induration, erythema or warmth.  Mainly tender and fluctuant with small drainage hole.  Otherwise, no obvious fistula, nontender on rectal exam, no mass or internal hemorrhoids palpated on DRE  Assessment and Plan :     Encounter Diagnoses  Name Primary?  . Perirectal abscess Yes  . External hemorrhoid    Very early abscess.  Script today for Bactrim, SITZ baths, and if not improving or worse in the next 2-3 days call/return.  Discussed possible complications and symptoms/signs to watch for that would suggest worse infection.    External hemorrhoids - discussed increased fiber and water in diet, script  today for Proctosol cream.   Call report 2-3 days.

## 2011-04-21 ENCOUNTER — Telehealth: Payer: Self-pay | Admitting: Family Medicine

## 2011-04-21 MED ORDER — ALPRAZOLAM 0.25 MG PO TABS: 0.2500 mg | ORAL_TABLET | Freq: Every evening | ORAL | Status: DC | PRN

## 2011-04-21 NOTE — Telephone Encounter (Signed)
Renew the Xanax

## 2011-04-21 NOTE — Telephone Encounter (Signed)
Called in xanax no refills

## 2011-05-29 ENCOUNTER — Other Ambulatory Visit: Payer: Self-pay | Admitting: Medical

## 2011-05-29 ENCOUNTER — Other Ambulatory Visit: Payer: Self-pay | Admitting: Family Medicine

## 2011-05-29 MED ORDER — ALPRAZOLAM 0.25 MG PO TABS: 0.2500 mg | ORAL_TABLET | Freq: Every evening | ORAL | Status: DC | PRN

## 2011-05-29 NOTE — Telephone Encounter (Signed)
[facility] 

## 2011-05-29 NOTE — Telephone Encounter (Signed)
Pt needs refill on xanax and proctosol-HC  .25% cream     [facility]

## 2011-05-29 NOTE — Telephone Encounter (Signed)
Call in Xanax  

## 2011-06-01 NOTE — Telephone Encounter (Signed)
RX REFILL 

## 2011-06-30 ENCOUNTER — Encounter: Payer: PRIVATE HEALTH INSURANCE | Admitting: Family Medicine

## 2011-07-02 ENCOUNTER — Other Ambulatory Visit: Payer: Self-pay | Admitting: Family Medicine

## 2011-07-02 ENCOUNTER — Other Ambulatory Visit: Payer: Self-pay

## 2011-07-02 MED ORDER — ALPRAZOLAM 0.25 MG PO TABS: 0.2500 mg | ORAL_TABLET | Freq: Every evening | ORAL | Status: DC | PRN

## 2011-07-02 NOTE — Telephone Encounter (Signed)
Pt needs xanax refill . Going OOT tomorrow/flying

## 2011-07-02 NOTE — Telephone Encounter (Signed)
Called med in per jcl 

## 2011-07-02 NOTE — Telephone Encounter (Signed)
Renew the Xanax

## 2011-07-02 NOTE — Telephone Encounter (Signed)
Med called in per jcl 

## 2011-07-07 ENCOUNTER — Other Ambulatory Visit: Payer: Self-pay | Admitting: *Deleted

## 2011-07-07 MED ORDER — POTASSIUM CHLORIDE CRYS ER 10 MEQ PO TBCR: 10.0000 meq | EXTENDED_RELEASE_TABLET | Freq: Every day | ORAL | Status: DC

## 2011-07-10 ENCOUNTER — Encounter: Payer: PRIVATE HEALTH INSURANCE | Admitting: Family Medicine

## 2011-07-17 ENCOUNTER — Other Ambulatory Visit: Payer: Self-pay

## 2011-07-17 MED ORDER — LISINOPRIL 10 MG PO TABS: 10.0000 mg | ORAL_TABLET | Freq: Every day | ORAL | Status: DC

## 2011-08-05 ENCOUNTER — Telehealth: Payer: Self-pay | Admitting: Internal Medicine

## 2011-08-05 MED ORDER — ALPRAZOLAM 0.25 MG PO TABS: 0.2500 mg | ORAL_TABLET | Freq: Every evening | ORAL | Status: DC | PRN

## 2011-08-05 NOTE — Telephone Encounter (Signed)
Called xanax in per jcl 

## 2011-08-05 NOTE — Telephone Encounter (Signed)
Okay to renew

## 2011-08-17 ENCOUNTER — Other Ambulatory Visit: Payer: Self-pay | Admitting: Family Medicine

## 2011-08-25 ENCOUNTER — Encounter: Payer: PRIVATE HEALTH INSURANCE | Admitting: Family Medicine

## 2011-09-14 ENCOUNTER — Other Ambulatory Visit: Payer: Self-pay | Admitting: *Deleted

## 2011-09-14 MED ORDER — POTASSIUM CHLORIDE CRYS ER 10 MEQ PO TBCR: 10.0000 meq | EXTENDED_RELEASE_TABLET | Freq: Every day | ORAL | Status: DC

## 2011-09-15 ENCOUNTER — Encounter: Payer: Self-pay | Admitting: Family Medicine

## 2011-09-15 ENCOUNTER — Other Ambulatory Visit: Payer: Self-pay

## 2011-09-15 ENCOUNTER — Ambulatory Visit (INDEPENDENT_AMBULATORY_CARE_PROVIDER_SITE_OTHER): Payer: PRIVATE HEALTH INSURANCE | Admitting: Family Medicine

## 2011-09-15 ENCOUNTER — Telehealth: Payer: Self-pay | Admitting: Cardiovascular Disease

## 2011-09-15 VITALS — BP 128/82 | HR 67 | Ht 67.0 in | Wt 179.0 lb

## 2011-09-15 DIAGNOSIS — F40243 Fear of flying: Secondary | ICD-10-CM

## 2011-09-15 DIAGNOSIS — Z8249 Family history of ischemic heart disease and other diseases of the circulatory system: Secondary | ICD-10-CM

## 2011-09-15 DIAGNOSIS — I10 Essential (primary) hypertension: Secondary | ICD-10-CM

## 2011-09-15 DIAGNOSIS — E785 Hyperlipidemia, unspecified: Secondary | ICD-10-CM

## 2011-09-15 DIAGNOSIS — F40298 Other specified phobia: Secondary | ICD-10-CM

## 2011-09-15 DIAGNOSIS — Z0000 Encounter for general adult medical examination without abnormal findings: Secondary | ICD-10-CM

## 2011-09-15 DIAGNOSIS — Z8546 Personal history of malignant neoplasm of prostate: Secondary | ICD-10-CM

## 2011-09-15 DIAGNOSIS — J309 Allergic rhinitis, unspecified: Secondary | ICD-10-CM

## 2011-09-15 DIAGNOSIS — K649 Unspecified hemorrhoids: Secondary | ICD-10-CM

## 2011-09-15 DIAGNOSIS — J302 Other seasonal allergic rhinitis: Secondary | ICD-10-CM

## 2011-09-15 LAB — CBC WITH DIFFERENTIAL/PLATELET
Basophils Absolute: 0 10*3/uL (ref 0.0–0.1)
Basophils Relative: 0 % (ref 0–1)
Hemoglobin: 17.1 g/dL — ABNORMAL HIGH (ref 13.0–17.0)
MCHC: 34.8 g/dL (ref 30.0–36.0)
Neutro Abs: 3.6 10*3/uL (ref 1.7–7.7)
Neutrophils Relative %: 61 % (ref 43–77)
Platelets: 224 10*3/uL (ref 150–400)
RDW: 14.5 % (ref 11.5–15.5)

## 2011-09-15 LAB — POCT URINALYSIS DIPSTICK
Bilirubin, UA: NEGATIVE
Leukocytes, UA: NEGATIVE
Nitrite, UA: NEGATIVE
Protein, UA: NEGATIVE
Urobilinogen, UA: NEGATIVE
pH, UA: 7

## 2011-09-15 LAB — COMPREHENSIVE METABOLIC PANEL
Alkaline Phosphatase: 64 U/L (ref 39–117)
Creat: 0.89 mg/dL (ref 0.50–1.35)
Glucose, Bld: 86 mg/dL (ref 70–99)
Sodium: 139 mEq/L (ref 135–145)
Total Bilirubin: 1 mg/dL (ref 0.3–1.2)
Total Protein: 6.5 g/dL (ref 6.0–8.3)

## 2011-09-15 LAB — LIPID PANEL
LDL Cholesterol: 112 mg/dL — ABNORMAL HIGH (ref 0–99)
Total CHOL/HDL Ratio: 3.8 Ratio
Triglycerides: 166 mg/dL — ABNORMAL HIGH (ref <150)
VLDL: 33 mg/dL (ref 0–40)

## 2011-09-15 MED ORDER — HYDROCHLOROTHIAZIDE 12.5 MG PO CAPS: 12.5000 mg | ORAL_CAPSULE | Freq: Every day | ORAL | Status: DC

## 2011-09-15 MED ORDER — MOMETASONE FUROATE 50 MCG/ACT NA SUSP: 2.0000 | Freq: Every day | NASAL | Status: DC

## 2011-09-15 MED ORDER — LISINOPRIL 10 MG PO TABS: 10.0000 mg | ORAL_TABLET | Freq: Every day | ORAL | Status: DC

## 2011-09-15 MED ORDER — ALPRAZOLAM 0.25 MG PO TABS: 0.2500 mg | ORAL_TABLET | Freq: Every evening | ORAL | Status: DC | PRN

## 2011-09-15 MED ORDER — ATORVASTATIN CALCIUM 40 MG PO TABS: 40.0000 mg | ORAL_TABLET | Freq: Every day | ORAL | Status: DC

## 2011-09-15 MED ORDER — HYDROCORTISONE 2.5 % RE CREA: TOPICAL_CREAM | Freq: Two times a day (BID) | RECTAL | Status: DC

## 2011-09-15 NOTE — Telephone Encounter (Signed)
New problem:  Per  Tammy,  Office is aware that Dr. Melburn Popper is in Cath lab today. Please review the EKG that was done in office today. EKG was scan into the epic system

## 2011-09-15 NOTE — Telephone Encounter (Signed)
[facility]ed [facility] rectal cream if there is a generic for this the patient would like this

## 2011-09-15 NOTE — Telephone Encounter (Signed)
EKG SAME AS WAS PRIOR 06/2010 DR LALOND INFORMED PER DR Elease Hashimoto

## 2011-09-15 NOTE — Progress Notes (Signed)
Subjective:    Patient ID: Terry Hays, male    DOB: 01-07-1951, 61 y.o.   MRN: [id number]  HPI He is here for complete examination. He continues to be quite active physically and with his work. He has no intentions of retiring. His home life is going well. He has had some difficulty with knee pain but is able to play basketball regularly. He recently had a tooth knocked out while playing ball. He does have difficulty with flying and uses Xanax to help with that. He also uses it to help him with sleep when he is traveling. He does have difficulty with hemorrhoids and would like medication. His immunizations are up to date. His family past medical and social history were reviewed  Review of Systems Negative except as above    Objective:   Physical Exam BP 128/82  Pulse 67  Ht 5\' 7"  (1.702 m)  Wt 179 lb (81.194 kg)  BMI 28.04 kg/m2  SpO2 97%  General Appearance:    Alert, cooperative, no distress, appears stated age  Head:    Normocephalic, without obvious abnormality, atraumatic  Eyes:    PERRL, conjunctiva/corneas clear, EOM's intact, fundi    benign  Ears:    Normal TM's and external ear canals  Nose:   Nares normal, mucosa normal, no drainage or sinus   tenderness  Throat:   Lips, mucosa, and tongue normal; teeth and gums normal  Neck:   Supple, no lymphadenopathy;  thyroid:  no   enlargement/tenderness/nodules; no carotid   bruit or JVD  Back:    Spine nontender, no curvature, ROM normal, no CVA     tenderness  Lungs:     Clear to auscultation bilaterally without wheezes, rales or     ronchi; respirations unlabored  Chest Wall:    No tenderness or deformity   Heart:    slightly irregular rhythm noted S1 and S2 normal, no murmur, rub   or gallop  Breast Exam:    No chest wall tenderness, masses or gynecomastia  Abdomen:     Soft, non-tender, nondistended, normoactive bowel sounds,    no masses, no hepatosplenomegaly     Rectal:    Normal sphincter tone, no masses or  tenderness; guaiac negative stool.  Prostate smooth, no nodules, not enlarged.  Extremities:   No clubbing, cyanosis or edema  Pulses:   2+ and symmetric all extremities  Skin:   Skin color, texture, turgor normal, no rashes or lesions  Lymph nodes:   Cervical, supraclavicular, and axillary nodes normal  Neurologic:   CNII-XII intact, normal strength, sensation and gait; reflexes 2+ and symmetric throughout          Psych:   Normal mood, affect, hygiene and grooming.    EKG did show what appeared to be PAC"s       Assessment & Plan:   1. Routine general medical examination at a health care facility  POCT Urinalysis Dipstick, PR ELECTROCARDIOGRAM, COMPLETE, Hemoccult - 1 Card (office), CBC with Differential, Comprehensive metabolic panel, Lipid panel  2. Dyslipidemia  Lipid panel  3. Hypertension    4. History of prostate cancer    5. Fear of flying  ALPRAZolam (XANAX) 0.25 MG tablet  6. Family history of heart disease in male family member before age 75    7. Allergic rhinitis, seasonal    8. Hemorrhoids     I will renew all of his medications for 90 days. We discussed his anxiety and sleep issues and  he is handling this well. I will have his cardiologist review the EKG.

## 2011-09-15 NOTE — Telephone Encounter (Signed)
ekg copied and will show Dr Elease Hashimoto this afternoon, paper chart obtained for review.

## 2011-09-17 ENCOUNTER — Telehealth: Payer: Self-pay

## 2011-09-17 NOTE — Telephone Encounter (Signed)
Let him know that I discussed this with his cardiologist and he is fine

## 2011-09-17 NOTE — Telephone Encounter (Signed)
Pt informed and is happy

## 2011-09-17 NOTE — Telephone Encounter (Signed)
Patient wants to know if his heart is all right he just wants to make sure

## 2011-11-10 ENCOUNTER — Other Ambulatory Visit: Payer: Self-pay | Admitting: Family Medicine

## 2011-11-10 NOTE — Telephone Encounter (Signed)
Is this ok?

## 2011-12-29 ENCOUNTER — Other Ambulatory Visit: Payer: Self-pay | Admitting: *Deleted

## 2011-12-29 MED ORDER — POTASSIUM CHLORIDE CRYS ER 10 MEQ PO TBCR: 10.0000 meq | EXTENDED_RELEASE_TABLET | Freq: Every day | ORAL | Status: DC

## 2012-02-12 ENCOUNTER — Other Ambulatory Visit: Payer: Self-pay

## 2012-02-12 MED ORDER — POTASSIUM CHLORIDE CRYS ER 10 MEQ PO TBCR: 10.0000 meq | EXTENDED_RELEASE_TABLET | Freq: Every day | ORAL | Status: DC

## 2012-03-08 ENCOUNTER — Other Ambulatory Visit: Payer: Self-pay | Admitting: Family Medicine

## 2012-03-08 NOTE — Telephone Encounter (Signed)
Rx refill on xanax.

## 2012-03-10 ENCOUNTER — Other Ambulatory Visit (INDEPENDENT_AMBULATORY_CARE_PROVIDER_SITE_OTHER): Payer: BC Managed Care – PPO

## 2012-03-10 ENCOUNTER — Other Ambulatory Visit: Payer: Self-pay

## 2012-03-10 DIAGNOSIS — F40243 Fear of flying: Secondary | ICD-10-CM

## 2012-03-10 DIAGNOSIS — Z23 Encounter for immunization: Secondary | ICD-10-CM

## 2012-03-10 MED ORDER — ALPRAZOLAM 0.25 MG PO TABS: 0.2500 mg | ORAL_TABLET | Freq: Every evening | ORAL | Status: DC | PRN

## 2012-03-10 NOTE — Telephone Encounter (Signed)
Renew this 

## 2012-03-10 NOTE — Telephone Encounter (Signed)
CALLED IN XANAX PER JCL 

## 2012-03-23 ENCOUNTER — Other Ambulatory Visit: Payer: Self-pay | Admitting: *Deleted

## 2012-03-29 ENCOUNTER — Other Ambulatory Visit: Payer: Self-pay | Admitting: Family Medicine

## 2012-03-30 ENCOUNTER — Other Ambulatory Visit: Payer: Self-pay | Admitting: *Deleted

## 2012-03-30 MED ORDER — POTASSIUM CHLORIDE CRYS ER 10 MEQ PO TBCR: 10.0000 meq | EXTENDED_RELEASE_TABLET | Freq: Every day | ORAL | Status: DC

## 2012-03-30 NOTE — Telephone Encounter (Signed)
Opened in Error.

## 2012-03-30 NOTE — Telephone Encounter (Signed)
NO REFILLS UNTIL APPOINTMENT Fax Received. Refill Completed. Elianys Conry Chowoe (R.M.A)   

## 2012-05-02 ENCOUNTER — Other Ambulatory Visit: Payer: Self-pay | Admitting: *Deleted

## 2012-05-02 ENCOUNTER — Ambulatory Visit: Payer: BC Managed Care – PPO | Admitting: Cardiovascular Disease

## 2012-05-10 ENCOUNTER — Encounter: Payer: Self-pay | Admitting: Cardiovascular Disease

## 2012-06-10 ENCOUNTER — Ambulatory Visit (INDEPENDENT_AMBULATORY_CARE_PROVIDER_SITE_OTHER): Payer: BC Managed Care – PPO | Admitting: Cardiovascular Disease

## 2012-06-10 ENCOUNTER — Encounter: Payer: Self-pay | Admitting: Cardiovascular Disease

## 2012-06-10 VITALS — BP 132/72 | HR 69 | Ht 67.0 in | Wt 179.0 lb

## 2012-06-10 DIAGNOSIS — I517 Cardiomegaly: Secondary | ICD-10-CM

## 2012-06-10 DIAGNOSIS — I10 Essential (primary) hypertension: Secondary | ICD-10-CM

## 2012-06-10 MED ORDER — POTASSIUM CHLORIDE CRYS ER 10 MEQ PO TBCR: 10.0000 meq | EXTENDED_RELEASE_TABLET | Freq: Every day | ORAL | Status: DC

## 2012-06-10 NOTE — Progress Notes (Signed)
Terry Hays Date of Birth  [birth year] [facility] [address]     [address] [place], [place]  [address] [phone]  Fax  [phone]  Problem List 1. Hypertension 2. LVH 3. Hyperlipidemia 4.   History of Present Illness:  Terry Hays is a middle-aged gentleman with a history of hypertension, left ventricular hypertrophy, Hypercholesterolemia and an abnormal EKG.  He's done very well since I last saw him one year ago. He still plays basketball on a regular basis. He's been saying little bit better hydrated and has noted that he is not having nearly as much dizziness. His energy levels have also been better.  June 10, 2012  Terry Hays is doing well.  He has had some orthopedic issues which has kept him out of the basketball court.  Current Outpatient Prescriptions on File Prior to Visit  Medication Sig Dispense Refill  . ALPRAZolam (XANAX) 0.25 MG tablet Take 1 tablet (0.25 mg total) by mouth at bedtime as needed.  50 tablet  2  . amoxicillin (AMOXIL) 500 MG capsule Take 500 mg by mouth as needed.       Marland Kitchen aspirin 325 MG tablet Take 325 mg by mouth daily.        Marland Kitchen atorvastatin (LIPITOR) 40 MG tablet Take 1 tablet (40 mg total) by mouth daily.  90 tablet  4  . hydrochlorothiazide (MICROZIDE) 12.5 MG capsule Take 1 capsule (12.5 mg total) by mouth daily.  90 capsule  3  . lisinopril (PRINIVIL,ZESTRIL) 10 MG tablet Take 1 tablet (10 mg total) by mouth daily.  90 tablet  3  . PROCTOSOL HC 2.5 % rectal cream PLACE RECTALLY 2 (TWO) TIMES DAILY.  28.35 g  5   No current facility-administered medications on file prior to visit.    Allergies  Allergen Reactions  . Tramadol     "hallucinations"    Past Medical History  Diagnosis Date  . LVH (left ventricular hypertrophy)   . Dyslipidemia   . Abnormal ECG   . Allergy   . Anxiety     FLYING  . Hyperlipidemia   . Diverticulosis   . History of prostate cancer     Past Surgical History   Procedure Laterality Date  . Eye surgery  2001    CATOACTS BOTH  . Hernia repair  1954    R INGHINAL  . Prostate surgery  2003    PROSTRATECTOMY  . Colonoscopy  2004 2011    MAGOD  . Penile prosthesis implant  2011    DAHLSTEDT    History  Smoking status  . Former Smoker  . Quit date: 03/09/1976  Smokeless tobacco  . Not on file    History  Alcohol Use  . 0.6 oz/week  . 1 Glasses of wine per week    Family History  Problem Relation Age of Onset  . Heart attack    . Hyperlipidemia    . Cancer      Reviw of Systems:  Reviewed in the HPI.  All other systems are negative.  Physical Exam: BP 132/72  Pulse 69  Ht 5\' 7"  (1.702 m)  Wt 179 lb (81.194 kg)  BMI 28.03 kg/m2  SpO2 97% The patient is alert and oriented x 3.  The mood and affect are normal.  The skin is warm and dry.  Color is normal.  The HEENT exam reveals that the sclera are nonicteric.  The mucous membranes are moist.  The carotids are  2+ without bruits.  There is no thyromegaly.  There is no JVD.  The lungs are clear.  The chest wall is non tender.  The heart exam reveals a regular rate with a normal S1 and S2.  There are no murmurs, gallops, or rubs.  The PMI is not displaced.   Abdominal exam reveals good bowel sounds.  There is no guarding or rebound.  There is no hepatosplenomegaly or tenderness.  There are no masses.  Exam of the legs reveal no clubbing, cyanosis, or edema.  The legs are without rashes.  The distal pulses are intact.  Cranial nerves II - XII are intact.  Motor and sensory functions are intact.  The gait is normal.  ECG: June 10, 2012 NSR, LVH with repol.    Assessment / Plan:

## 2012-06-10 NOTE — Assessment & Plan Note (Signed)
Terry Hays is doing very well. He continues to play basketball on a regular basis. He's not had any problems. His baseline EKG shows T-wave inversions in the lateral leads. We have performed a stress Myoview study in the past which was normal.  We'll continue to follow him on a yearly basis. I've refilled his potassium tablets. I've asked him to call if he has any problems.

## 2012-06-10 NOTE — Patient Instructions (Addendum)
Your physician wants you to follow-up in: 1 YEAR WITH EKG  You will receive a reminder letter in the mail two months in advance. If you don't receive a letter, please call our office to schedule the follow-up appointment.   Your physician recommends that you continue on your current medications as directed. Please refer to the Current Medication list given to you today.  

## 2012-08-08 ENCOUNTER — Other Ambulatory Visit: Payer: Self-pay

## 2012-08-08 ENCOUNTER — Other Ambulatory Visit: Payer: Self-pay | Admitting: Family Medicine

## 2012-08-08 MED ORDER — ALPRAZOLAM 0.25 MG PO TABS: ORAL_TABLET | ORAL | Status: DC

## 2012-08-08 NOTE — Telephone Encounter (Signed)
Is this okay to refill? 

## 2012-08-08 NOTE — Telephone Encounter (Signed)
Okay to renew

## 2012-08-08 NOTE — Telephone Encounter (Signed)
CALLED XANAX IN PER JCL 

## 2012-09-11 ENCOUNTER — Other Ambulatory Visit: Payer: Self-pay | Admitting: Family Medicine

## 2012-12-11 ENCOUNTER — Other Ambulatory Visit: Payer: Self-pay | Admitting: Family Medicine

## 2012-12-23 ENCOUNTER — Other Ambulatory Visit (INDEPENDENT_AMBULATORY_CARE_PROVIDER_SITE_OTHER): Payer: BC Managed Care – PPO

## 2012-12-23 DIAGNOSIS — Z23 Encounter for immunization: Secondary | ICD-10-CM

## 2013-01-09 ENCOUNTER — Other Ambulatory Visit: Payer: Self-pay | Admitting: Family Medicine

## 2013-01-09 NOTE — Telephone Encounter (Signed)
Okay to renew

## 2013-01-09 NOTE — Telephone Encounter (Signed)
IS THIS OK 

## 2013-03-13 ENCOUNTER — Other Ambulatory Visit: Payer: Self-pay | Admitting: Family Medicine

## 2013-03-15 NOTE — Telephone Encounter (Signed)
Are these okay to refill? 

## 2013-03-16 ENCOUNTER — Other Ambulatory Visit: Payer: Self-pay | Admitting: *Deleted

## 2013-03-16 MED ORDER — HYDROCHLOROTHIAZIDE 12.5 MG PO CAPS: 12.5000 mg | ORAL_CAPSULE | Freq: Every day | ORAL | Status: DC

## 2013-03-16 NOTE — Telephone Encounter (Signed)
He needs a follow-up appointment

## 2013-03-21 ENCOUNTER — Encounter: Payer: Self-pay | Admitting: Family Medicine

## 2013-03-23 ENCOUNTER — Ambulatory Visit (INDEPENDENT_AMBULATORY_CARE_PROVIDER_SITE_OTHER): Payer: BC Managed Care – PPO | Admitting: Family Medicine

## 2013-03-23 ENCOUNTER — Encounter: Payer: Self-pay | Admitting: Family Medicine

## 2013-03-23 VITALS — BP 126/78 | HR 62 | Wt 179.0 lb

## 2013-03-23 DIAGNOSIS — Z8546 Personal history of malignant neoplasm of prostate: Secondary | ICD-10-CM

## 2013-03-23 DIAGNOSIS — J309 Allergic rhinitis, unspecified: Secondary | ICD-10-CM

## 2013-03-23 DIAGNOSIS — Z79899 Other long term (current) drug therapy: Secondary | ICD-10-CM

## 2013-03-23 DIAGNOSIS — Z8249 Family history of ischemic heart disease and other diseases of the circulatory system: Secondary | ICD-10-CM

## 2013-03-23 DIAGNOSIS — F40243 Fear of flying: Secondary | ICD-10-CM

## 2013-03-23 DIAGNOSIS — I10 Essential (primary) hypertension: Secondary | ICD-10-CM

## 2013-03-23 DIAGNOSIS — F40298 Other specified phobia: Secondary | ICD-10-CM

## 2013-03-23 DIAGNOSIS — I517 Cardiomegaly: Secondary | ICD-10-CM

## 2013-03-23 DIAGNOSIS — E785 Hyperlipidemia, unspecified: Secondary | ICD-10-CM

## 2013-03-23 DIAGNOSIS — J302 Other seasonal allergic rhinitis: Secondary | ICD-10-CM

## 2013-03-23 LAB — COMPREHENSIVE METABOLIC PANEL
ALBUMIN: 4.5 g/dL (ref 3.5–5.2)
ALK PHOS: 68 U/L (ref 39–117)
ALT: 24 U/L (ref 0–53)
AST: 21 U/L (ref 0–37)
BILIRUBIN TOTAL: 0.7 mg/dL (ref 0.3–1.2)
BUN: 15 mg/dL (ref 6–23)
CO2: 24 mEq/L (ref 19–32)
Calcium: 9.6 mg/dL (ref 8.4–10.5)
Chloride: 105 mEq/L (ref 96–112)
Creat: 0.7 mg/dL (ref 0.50–1.35)
Glucose, Bld: 88 mg/dL (ref 70–99)
POTASSIUM: 4 meq/L (ref 3.5–5.3)
Sodium: 141 mEq/L (ref 135–145)
TOTAL PROTEIN: 6.7 g/dL (ref 6.0–8.3)

## 2013-03-23 LAB — CBC WITH DIFFERENTIAL/PLATELET
Basophils Absolute: 0 10*3/uL (ref 0.0–0.1)
Basophils Relative: 1 % (ref 0–1)
EOS ABS: 0.2 10*3/uL (ref 0.0–0.7)
Eosinophils Relative: 4 % (ref 0–5)
HCT: 46.7 % (ref 39.0–52.0)
HEMOGLOBIN: 15.9 g/dL (ref 13.0–17.0)
Lymphocytes Relative: 18 % (ref 12–46)
Lymphs Abs: 1.1 10*3/uL (ref 0.7–4.0)
MCH: 30.5 pg (ref 26.0–34.0)
MCHC: 34 g/dL (ref 30.0–36.0)
MCV: 89.5 fL (ref 78.0–100.0)
MONOS PCT: 10 % (ref 3–12)
Monocytes Absolute: 0.6 10*3/uL (ref 0.1–1.0)
NEUTROS ABS: 4.3 10*3/uL (ref 1.7–7.7)
NEUTROS PCT: 67 % (ref 43–77)
PLATELETS: 236 10*3/uL (ref 150–400)
RBC: 5.22 MIL/uL (ref 4.22–5.81)
RDW: 14.6 % (ref 11.5–15.5)
WBC: 6.2 10*3/uL (ref 4.0–10.5)

## 2013-03-23 LAB — LIPID PANEL
Cholesterol: 187 mg/dL (ref 0–200)
HDL: 64 mg/dL (ref >39)
LDL CALC: 98 mg/dL (ref 0–99)
Total CHOL/HDL Ratio: 2.9 Ratio
Triglycerides: 123 mg/dL (ref <150)
VLDL: 25 mg/dL (ref 0–40)

## 2013-03-23 MED ORDER — ATORVASTATIN CALCIUM 40 MG PO TABS: 40.0000 mg | ORAL_TABLET | Freq: Every day | ORAL | Status: DC

## 2013-03-23 MED ORDER — LISINOPRIL-HYDROCHLOROTHIAZIDE 10-12.5 MG PO TABS: 1.0000 | ORAL_TABLET | Freq: Every day | ORAL | Status: DC

## 2013-03-23 NOTE — Progress Notes (Signed)
   Subjective:    Patient ID: Terry Hays, male    DOB: 02/12/1951, 63 y.o.   MRN: [id number]  HPI He is here for medication check. He continues on Lipitor and is having no difficulty with this. He does have a family history of heart disease and does have LVH. He does see his cardiologist regularly. He has a previous history of prostate cancer and a penile prosthesis. He does have fear of flying and does use Xanax very sparingly for this. His work and home life are going quite well.   Review of Systems     Objective:   Physical Exam alert and in no distress. Tympanic membranes and canals are normal. Throat is clear. Tonsils are normal. Neck is supple without adenopathy or thyromegaly. Cardiac exam shows a regular sinus rhythm without murmurs or gallops. Lungs are clear to auscultation.        Assessment & Plan:  Dyslipidemia - Plan: atorvastatin (LIPITOR) 40 MG tablet  Family history of heart disease in male family member before age 87 - Plan: CBC with Differential, Comprehensive metabolic panel, Lipid panel  Hypertension - Plan: CBC with Differential, Comprehensive metabolic panel, lisinopril-hydrochlorothiazide (PRINZIDE,ZESTORETIC) 10-12.5 MG per tablet  History of prostate cancer  LVH (left ventricular hypertrophy)  Fear of flying  Allergic rhinitis, seasonal  Encounter for long-term (current) use of other medications - Plan: CBC with Differential, Comprehensive metabolic panel, Lipid panel  continue on present medications.

## 2013-03-24 NOTE — Progress Notes (Signed)
LMTCB

## 2013-06-08 ENCOUNTER — Other Ambulatory Visit: Payer: Self-pay | Admitting: Family Medicine

## 2013-06-08 NOTE — Telephone Encounter (Signed)
Is this okay to refill? 

## 2013-06-08 NOTE — Telephone Encounter (Signed)
Okay to renew

## 2013-06-12 NOTE — Telephone Encounter (Signed)
Medication called in 

## 2013-06-25 ENCOUNTER — Other Ambulatory Visit: Payer: Self-pay | Admitting: Cardiovascular Disease

## 2013-07-19 ENCOUNTER — Encounter: Payer: Self-pay | Admitting: Cardiovascular Disease

## 2013-07-19 ENCOUNTER — Ambulatory Visit (INDEPENDENT_AMBULATORY_CARE_PROVIDER_SITE_OTHER): Payer: BC Managed Care – PPO | Admitting: Cardiovascular Disease

## 2013-07-19 VITALS — BP 154/82 | HR 59 | Ht 67.0 in | Wt 178.0 lb

## 2013-07-19 DIAGNOSIS — I517 Cardiomegaly: Secondary | ICD-10-CM

## 2013-07-19 DIAGNOSIS — I10 Essential (primary) hypertension: Secondary | ICD-10-CM

## 2013-07-19 MED ORDER — ASPIRIN EC 81 MG PO TBEC: 81.0000 mg | DELAYED_RELEASE_TABLET | Freq: Every day | ORAL | Status: DC

## 2013-07-19 NOTE — Assessment & Plan Note (Signed)
Terry Hays has TWI that is unchanged form his previous tracings.  He is still very active.  No angina.  He has had a negative myoview in the past.    I will see him in 1 year.

## 2013-07-19 NOTE — Assessment & Plan Note (Signed)
Terry Hays is doing very well. His EKG remains unchanged. His blood pressure is a little high today but it has been normal when he checks at home. We'll continue with the same medications. Advised him to let me know his blood pressure starts to go up.

## 2013-07-19 NOTE — Progress Notes (Signed)
Terry Hays Date of Birth  Mar 18, 1950 [facility] [address]     [place], [place]  [address] [phone]  Fax  [phone]  Problem List 1. Hypertension 2. LVH 3. Hyperlipidemia 4.   History of Present Illness:  Terry Hays is a middle-aged gentleman with a history of hypertension, left ventricular hypertrophy, Hypercholesterolemia and an abnormal EKG.  He's done very well since I last saw him one year ago. He still plays basketball on a regular basis. He's been saying little bit better hydrated and has noted that he is not having nearly as much dizziness. His energy levels have also been better.  June 10, 2012  Terry Hays is doing well.  He has had some orthopedic issues which has kept him out of the basketball court.  Jul 19, 2013:  Terry Hays is doing well.  He checks his blood pressure on regular basis at home and his readings are in the normal range. If it is a little high today in the office.  He remains very busy. 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.  Current Outpatient Prescriptions on File Prior to Visit  Medication Sig Dispense Refill  . ALPRAZolam (XANAX) 0.5 MG tablet TAKE 1 TABLET BY MOUTH AT BEDTIME AS NEEDED  50 tablet  2  . aspirin 325 MG tablet Take 325 mg by mouth daily.        Marland Kitchen atorvastatin (LIPITOR) 40 MG tablet Take 1 tablet (40 mg total) by mouth daily. (PATIENT MUST HAVE MED CHECK BEFORE ANYMORE REFILLS)  90 tablet  3  . KLOR-CON M10 10 MEQ tablet TAKE 1 TABLET (10 MEQ TOTAL) BY MOUTH DAILY.  30 tablet  0  . lisinopril-hydrochlorothiazide (PRINZIDE,ZESTORETIC) 10-12.5 MG per tablet Take 1 tablet by mouth daily.  90 tablet  3  . mometasone (NASONEX) 50 MCG/ACT nasal spray Place 2 sprays into the nose as needed.      Marland Kitchen PROCTOSOL HC 2.5 % rectal cream PLACE RECTALLY 2 (TWO) TIMES DAILY.  28.35 g  5   No current facility-administered medications on file prior to  visit.    Allergies  Allergen Reactions  . Tramadol     "hallucinations"    Past Medical History  Diagnosis Date  . LVH (left ventricular hypertrophy)   . Dyslipidemia   . Abnormal ECG   . Allergy   . Anxiety     FLYING  . Hyperlipidemia   . Diverticulosis   . History of prostate cancer     Past Surgical History  Procedure Laterality Date  . Eye surgery  2001    CATOACTS BOTH  . Hernia repair  1954    R INGHINAL  . Prostate surgery  2003    PROSTRATECTOMY  . Colonoscopy  2004 2011    MAGOD  . Penile prosthesis implant  2011    DAHLSTEDT    History  Smoking status  . Former Smoker  . Quit date: 03/09/1976  Smokeless tobacco  . Not on file    History  Alcohol Use  . 0.6 oz/week  . 1 Glasses of wine per week    Family History  Problem Relation Age of Onset  . Heart attack    . Hyperlipidemia    . Cancer      Reviw of Systems:  Reviewed in the HPI.  All other systems are negative.  Physical Exam: BP 154/82  Pulse 59  Ht 5\' 7"  (1.702 m)  Wt 178 lb (80.74 kg)  BMI 27.87 kg/m2 The patient is alert and oriented x 3.  The mood and affect are normal.  The skin is warm and dry.  Color is normal.  The HEENT exam reveals that the sclera are nonicteric.  The mucous membranes are moist.  The carotids are 2+ without bruits.  There is no thyromegaly.  There is no JVD.  The lungs are clear.  The chest wall is non tender.  The heart exam reveals a regular rate with a normal S1 and S2.  There are no murmurs, gallops, or rubs.  The PMI is not displaced.   Abdominal exam reveals good bowel sounds.  There is no guarding or rebound.  There is no hepatosplenomegaly or tenderness.  There are no masses.  Exam of the legs reveal no clubbing, cyanosis, or edema.  The legs are without rashes.  The distal pulses are intact.  Cranial nerves II - XII are intact.  Motor and sensory functions are intact.  The gait is normal.  ECG: Jul 19, 2013:   Sinus brady,  NS ST abnormalities.      Assessment / Plan:

## 2013-07-19 NOTE — Patient Instructions (Signed)
Your physician has recommended you make the following change in your medication:  DECREASE Aspirin to 81 mg once daily  Your physician wants you to follow-up in: 1 year with Dr. Acie Fredrickson.  You will receive a reminder letter in the mail two months in advance. If you don't receive a letter, please call our office to schedule the follow-up appointment.

## 2013-08-18 ENCOUNTER — Other Ambulatory Visit: Payer: Self-pay | Admitting: Physician Assistant

## 2013-09-04 ENCOUNTER — Other Ambulatory Visit: Payer: Self-pay | Admitting: Family Medicine

## 2013-09-04 NOTE — Telephone Encounter (Signed)
Okay to renew

## 2013-09-04 NOTE — Telephone Encounter (Signed)
IS THIS OKAY 

## 2013-09-04 NOTE — Telephone Encounter (Signed)
Is this okay?

## 2013-09-05 NOTE — Telephone Encounter (Signed)
This should have been taking care of ;check on it.

## 2013-09-05 NOTE — Telephone Encounter (Signed)
Dr.lalonde I dont know if you have gotten this but I am resending it

## 2013-11-09 ENCOUNTER — Other Ambulatory Visit: Payer: Self-pay

## 2013-11-09 ENCOUNTER — Telehealth: Payer: Self-pay | Admitting: Family Medicine

## 2013-11-09 MED ORDER — ALPRAZOLAM 0.25 MG PO TABS: ORAL_TABLET | ORAL | Status: DC

## 2013-11-09 NOTE — Telephone Encounter (Signed)
DONE

## 2013-11-09 NOTE — Telephone Encounter (Signed)
Magee

## 2013-11-09 NOTE — Telephone Encounter (Signed)
Call this in 

## 2013-12-21 ENCOUNTER — Ambulatory Visit (INDEPENDENT_AMBULATORY_CARE_PROVIDER_SITE_OTHER): Payer: BC Managed Care – PPO | Admitting: Family Medicine

## 2013-12-21 ENCOUNTER — Ambulatory Visit
Admission: RE | Admit: 2013-12-21 | Discharge: 2013-12-21 | Disposition: A | Discharge status: Home / Self Care | Payer: BC Managed Care – PPO | Source: Ambulatory Visit | Attending: Family Medicine | Admitting: Family Medicine

## 2013-12-21 ENCOUNTER — Encounter: Payer: Self-pay | Admitting: Family Medicine

## 2013-12-21 VITALS — BP 130/80 | HR 68 | Wt 182.0 lb

## 2013-12-21 DIAGNOSIS — M25561 Pain in right knee: Secondary | ICD-10-CM

## 2013-12-21 DIAGNOSIS — Z23 Encounter for immunization: Secondary | ICD-10-CM

## 2013-12-21 NOTE — Progress Notes (Signed)
   Subjective:    Patient ID: Terry Hays, male    DOB: Jul 31, 1950, 64 y.o.   MRN: [id number]  HPI He complains of a five-year history of intermittent right lateral knee pain. In the last several months the pain has become more intense. He states that the knee will lock up preventing him from continuing to play basketball and then after short period time he is able to continue. He also notes popping sensation currently he had difficulty with walking when he went to [place] to watch a baseball game.   Review of Systems     Objective:   Physical Exam Right knee exam shows a small effusion. Slight discomfort to palpation to the lateral joint line. Medial and lateral collateral ligaments intact. Negative anterior drawer. McMurray's testing causes discomfort laterally. His record was reviewed and his immunizations were updated.      Assessment & Plan:  Need for prophylactic vaccination against Streptococcus pneumoniae (pneumococcus) - Plan: Pneumococcal conjugate vaccine 13-valent  Need for prophylactic vaccination and inoculation against influenza - Plan: Flu vaccine HIGH DOSE PF (Fluzone Tri High dose)  Need for prophylactic vaccination with combined diphtheria-tetanus-pertussis (DTP) vaccine - Plan: Tdap vaccine greater than or equal to 7yo IM  Right knee pain - Plan: MR Knee Right Wo Contrast

## 2014-01-15 ENCOUNTER — Other Ambulatory Visit: Payer: Self-pay | Admitting: Family Medicine

## 2014-01-15 NOTE — Telephone Encounter (Signed)
Is this [employer]

## 2014-01-15 NOTE — Telephone Encounter (Signed)
Okay to renew

## 2014-02-20 ENCOUNTER — Encounter: Payer: Self-pay | Admitting: Family Medicine

## 2014-02-20 ENCOUNTER — Ambulatory Visit (INDEPENDENT_AMBULATORY_CARE_PROVIDER_SITE_OTHER): Payer: BC Managed Care – PPO | Admitting: Family Medicine

## 2014-02-20 VITALS — BP 124/74 | HR 67 | Wt 179.0 lb

## 2014-02-20 DIAGNOSIS — M7631 Iliotibial band syndrome, right leg: Secondary | ICD-10-CM

## 2014-02-20 NOTE — Progress Notes (Signed)
   Subjective:    Patient ID: Terry Hays, male    DOB: 1950-12-25, 63 y.o.   MRN: [id number]  HPI  he is here for consult concerning continued difficulty with right lateral knee pain. He was seen by orthopedics and was given a steroid injection actually into his knee joint however they stated that his pain was mainly ITB related. He was given exercises which she states did help tremendously however upon continued use of the knee, his lateral knee pain reoccurred. He has been doing exercises at home. He is interested in my thoughts on this.   Review of Systems     Objective:   Physical Exam  alert and in no distress otherwise not seen. MRI does show medial and lateral cartilage damage.      Assessment & Plan:  Iliotibial band syndrome of right side  recommend he continue with his physical activity and stretching. I will also refer him for physical therapy to get more aggressive and to do iontophoresis. Again stressed the fact that since he is really not having true intrinsic knee problems, arthroscopy is really not an issue at this point.

## 2014-03-19 ENCOUNTER — Other Ambulatory Visit: Payer: Self-pay | Admitting: Family Medicine

## 2014-03-19 NOTE — Telephone Encounter (Signed)
Okay to renew

## 2014-03-19 NOTE — Telephone Encounter (Signed)
Is this okay?

## 2014-03-21 ENCOUNTER — Other Ambulatory Visit: Payer: Self-pay | Admitting: Family Medicine

## 2014-03-22 ENCOUNTER — Other Ambulatory Visit: Payer: Self-pay

## 2014-03-22 NOTE — Telephone Encounter (Signed)
Called in.

## 2014-03-22 NOTE — Telephone Encounter (Signed)
This is okay to call in

## 2014-03-22 NOTE — Telephone Encounter (Signed)
Is this okay?

## 2014-03-25 ENCOUNTER — Other Ambulatory Visit: Payer: Self-pay | Admitting: Family Medicine

## 2014-03-28 ENCOUNTER — Other Ambulatory Visit: Payer: Self-pay | Admitting: Family Medicine

## 2014-05-09 ENCOUNTER — Other Ambulatory Visit: Payer: Self-pay | Admitting: Family Medicine

## 2014-05-10 ENCOUNTER — Other Ambulatory Visit: Payer: Self-pay

## 2014-05-10 NOTE — Telephone Encounter (Signed)
Is this okay?

## 2014-05-10 NOTE — Telephone Encounter (Signed)
Okay for refill?  

## 2014-05-28 ENCOUNTER — Encounter: Payer: Self-pay | Admitting: Family Medicine

## 2014-05-28 ENCOUNTER — Ambulatory Visit (INDEPENDENT_AMBULATORY_CARE_PROVIDER_SITE_OTHER): Payer: BC Managed Care – PPO | Admitting: Family Medicine

## 2014-05-28 VITALS — BP 130/80 | HR 61 | Ht 68.0 in | Wt 180.0 lb

## 2014-05-28 DIAGNOSIS — E785 Hyperlipidemia, unspecified: Secondary | ICD-10-CM | POA: Diagnosis not present

## 2014-05-28 DIAGNOSIS — I10 Essential (primary) hypertension: Secondary | ICD-10-CM | POA: Diagnosis not present

## 2014-05-28 DIAGNOSIS — Z8546 Personal history of malignant neoplasm of prostate: Secondary | ICD-10-CM | POA: Diagnosis not present

## 2014-05-28 DIAGNOSIS — Z0000 Encounter for general adult medical examination without abnormal findings: Secondary | ICD-10-CM | POA: Diagnosis not present

## 2014-05-28 DIAGNOSIS — Z8249 Family history of ischemic heart disease and other diseases of the circulatory system: Secondary | ICD-10-CM | POA: Diagnosis not present

## 2014-05-28 DIAGNOSIS — J302 Other seasonal allergic rhinitis: Secondary | ICD-10-CM

## 2014-05-28 DIAGNOSIS — L309 Dermatitis, unspecified: Secondary | ICD-10-CM | POA: Diagnosis not present

## 2014-05-28 DIAGNOSIS — L821 Other seborrheic keratosis: Secondary | ICD-10-CM | POA: Diagnosis not present

## 2014-05-28 LAB — COMPREHENSIVE METABOLIC PANEL
ALBUMIN: 4.1 g/dL (ref 3.5–5.2)
ALT: 29 U/L (ref 0–53)
AST: 19 U/L (ref 0–37)
Alkaline Phosphatase: 75 U/L (ref 39–117)
BILIRUBIN TOTAL: 0.8 mg/dL (ref 0.2–1.2)
BUN: 12 mg/dL (ref 6–23)
CO2: 29 mEq/L (ref 19–32)
Calcium: 9.4 mg/dL (ref 8.4–10.5)
Chloride: 103 mEq/L (ref 96–112)
Creat: 0.9 mg/dL (ref 0.50–1.35)
Glucose, Bld: 95 mg/dL (ref 70–99)
Potassium: 4.5 mEq/L (ref 3.5–5.3)
SODIUM: 139 meq/L (ref 135–145)
Total Protein: 6.1 g/dL (ref 6.0–8.3)

## 2014-05-28 LAB — CBC WITH DIFFERENTIAL/PLATELET
Basophils Absolute: 0.1 10*3/uL (ref 0.0–0.1)
Basophils Relative: 1 % (ref 0–1)
EOS ABS: 0.4 10*3/uL (ref 0.0–0.7)
Eosinophils Relative: 6 % — ABNORMAL HIGH (ref 0–5)
HCT: 49 % (ref 39.0–52.0)
HEMOGLOBIN: 16.2 g/dL (ref 13.0–17.0)
Lymphocytes Relative: 19 % (ref 12–46)
Lymphs Abs: 1.2 10*3/uL (ref 0.7–4.0)
MCH: 29.4 pg (ref 26.0–34.0)
MCHC: 33.1 g/dL (ref 30.0–36.0)
MCV: 88.9 fL (ref 78.0–100.0)
MPV: 10 fL (ref 8.6–12.4)
Monocytes Absolute: 0.7 10*3/uL (ref 0.1–1.0)
Monocytes Relative: 11 % (ref 3–12)
NEUTROS PCT: 63 % (ref 43–77)
Neutro Abs: 3.9 10*3/uL (ref 1.7–7.7)
Platelets: 219 10*3/uL (ref 150–400)
RBC: 5.51 MIL/uL (ref 4.22–5.81)
RDW: 14.6 % (ref 11.5–15.5)
WBC: 6.2 10*3/uL (ref 4.0–10.5)

## 2014-05-28 LAB — LIPID PANEL
CHOL/HDL RATIO: 2.6 ratio
Cholesterol: 166 mg/dL (ref 0–200)
HDL: 63 mg/dL (ref >=40)
LDL Cholesterol: 81 mg/dL (ref 0–99)
TRIGLYCERIDES: 110 mg/dL (ref <150)
VLDL: 22 mg/dL (ref 0–40)

## 2014-05-28 MED ORDER — LISINOPRIL-HYDROCHLOROTHIAZIDE 10-12.5 MG PO TABS: 1.0000 | ORAL_TABLET | Freq: Every day | ORAL | Status: DC

## 2014-05-28 NOTE — Progress Notes (Signed)
Subjective:    Patient ID: Terry Hays, male    DOB: 08-Feb-1951, 64 y.o.   MRN: [id number]  HPI He is here for complete examination. He has had difficulty recently with a rash started approximately a month ago. He has lesions on his arms, legs and torso. He also is had some rash and itching in the inguinal area. He has been using an antifungal medication on this. He has an appointment with his dermatologist in the near future. He does have a previous history of prostate cancer however is having no difficulty with that. His allergies are under good control. He does have a family history of heart disease. He did stop taking his lisinopril/HCTZ because he was concerned that that might be causing his rash. He has been taking his lisinopril but not the HCTZ. The work and home life are going well. Family history and health maintenance were reviewed.He recently had a marriage in the family and one coming up which has him quite stressed.   Review of Systems  All other systems reviewed and are negative.      Objective:   Physical Exam BP 130/80 mmHg  Pulse 61  Ht 5\' 8"  (1.727 m)  Wt 180 lb (81.647 kg)  BMI 27.38 kg/m2  SpO2 97%  General Appearance:    Alert, cooperative, no distress, appears stated age  Head:    Normocephalic, without obvious abnormality, atraumatic  Eyes:    PERRL, conjunctiva/corneas clear, EOM's intact, fundi    benign  Ears:    Normal TM's and external ear canals  Nose:   Nares normal, mucosa normal, no drainage or sinus   tenderness  Throat:   Lips, mucosa, and tongue normal; teeth and gums normal  Neck:   Supple, no lymphadenopathy;  thyroid:  no   enlargement/tenderness/nodules; no carotid   bruit or JVD  Back:    Spine nontender, no curvature, ROM normal, no CVA     tenderness  Lungs:     Clear to auscultation bilaterally without wheezes, rales or     ronchi; respirations unlabored  Chest Wall:    No tenderness or deformity   Heart:    Regular rate and  rhythm, S1 and S2 normal, no murmur, rub   or gallop  Breast Exam:    No chest wall tenderness, masses or gynecomastia  Abdomen:     Soft, non-tender, nondistended, normoactive bowel sounds,    no masses, no hepatosplenomegaly        Extremities:   No clubbing, cyanosis or edema  Pulses:   2+ and symmetric all extremities  Skin:   Skin color, texture, turgor normal, Several different kinds of lesions are noted. On the legs some dried slightly pigmented areas are noted. In the inguinal area it is more erythematous and circular. He does have evidence of seborrheic keratoses on his back.  Lymph nodes:   Cervical, supraclavicular, and axillary nodes normal  Neurologic:   CNII-XII intact, normal strength, sensation and gait; reflexes 2+ and symmetric throughout          Psych:   Normal mood, affect, hygiene and grooming.          Assessment & Plan:  Essential hypertension - Plan: lisinopril-hydrochlorothiazide (PRINZIDE,ZESTORETIC) 10-12.5 MG per tablet  History of prostate cancer  Dyslipidemia  Allergic rhinitis, seasonal  Dermatitis  Family history of heart disease in male family member before age 45  Seborrheic keratosis Follow-up with dermatology. Also encouraged him to use of Xanax to  help with the family related stress issues.

## 2014-05-28 NOTE — Addendum Note (Signed)
Addended by: Denita Lung on: 05/28/2014 04:41 PM   Modules accepted: Orders

## 2014-06-14 ENCOUNTER — Other Ambulatory Visit: Payer: Self-pay | Admitting: Physician Assistant

## 2014-06-21 ENCOUNTER — Telehealth: Payer: Self-pay | Admitting: Internal Medicine

## 2014-06-21 MED ORDER — LOSARTAN POTASSIUM-HCTZ 50-12.5 MG PO TABS: 1.0000 | ORAL_TABLET | Freq: Every day | ORAL | Status: DC

## 2014-06-21 NOTE — Telephone Encounter (Signed)
Pt states that he went to [place] [facility] to rule out why he is getting hives and they can not rule out the bp he is only. He will know in a month or so if the hives are coming from his bp med but the doctor is wanting him to come over his bp to see if that is the cause of his hive. Please call pt to talk to him.

## 2014-06-21 NOTE — Telephone Encounter (Signed)
He has been having difficulty with a rash. A recent biopsy was nondiagnostic. There is question is whether this is related to blood pressure. I will therefore switch him to a different medication for at least a month to see if this will make a difference

## 2014-07-12 ENCOUNTER — Telehealth: Payer: Self-pay | Admitting: Internal Medicine

## 2014-07-12 MED ORDER — ALPRAZOLAM 0.25 MG PO TABS: ORAL_TABLET | ORAL | Status: DC

## 2014-07-12 NOTE — Telephone Encounter (Signed)
Pt called and states they have had a death in the family and is having to go out of town today at 4 and he needs his xanax refilled to [facility].

## 2014-07-12 NOTE — Telephone Encounter (Signed)
Also call pt when med has been called in

## 2014-07-12 NOTE — Telephone Encounter (Signed)
Called in med with one refill per [employer]

## 2014-08-20 ENCOUNTER — Other Ambulatory Visit: Payer: Self-pay | Admitting: Family Medicine

## 2014-08-22 ENCOUNTER — Other Ambulatory Visit: Payer: Self-pay

## 2014-08-22 MED ORDER — LOSARTAN POTASSIUM-HCTZ 50-12.5 MG PO TABS: 1.0000 | ORAL_TABLET | Freq: Every day | ORAL | Status: DC

## 2014-11-02 ENCOUNTER — Telehealth: Payer: Self-pay

## 2014-11-02 ENCOUNTER — Other Ambulatory Visit: Payer: Self-pay

## 2014-11-02 MED ORDER — ALPRAZOLAM 0.25 MG PO TABS: ORAL_TABLET | ORAL | Status: DC

## 2014-11-02 NOTE — Telephone Encounter (Signed)
Called in xanax per jcl 

## 2014-11-02 NOTE — Telephone Encounter (Signed)
Pt called wanting a refill on his Alprazolam 0.25 mg #100

## 2014-11-02 NOTE — Telephone Encounter (Signed)
done

## 2014-11-02 NOTE — Telephone Encounter (Signed)
Okay to renew

## 2014-11-27 ENCOUNTER — Telehealth: Payer: Self-pay | Admitting: Family Medicine

## 2014-11-27 NOTE — Telephone Encounter (Signed)
LM to [facility] 

## 2014-11-27 NOTE — Telephone Encounter (Signed)
Okay to refer? 

## 2014-11-27 NOTE — Telephone Encounter (Signed)
Please call  Patient is having hip pain again and wants referral back to Dr.  Jerrye Bushy (?sp) you referred him there before. He is located at [facility]

## 2014-11-28 NOTE — Telephone Encounter (Signed)
CHERI

## 2014-11-29 ENCOUNTER — Telehealth: Payer: Self-pay | Admitting: Family Medicine

## 2014-11-29 NOTE — Telephone Encounter (Signed)
Pt called and gave more info on the PT referral that he is requesting. He would like to see Asencion Partridge with Mike Craze PT at the [facility] since this is where he went previously.

## 2014-11-29 NOTE — Telephone Encounter (Signed)
i have faxed referral to [facility]

## 2014-11-29 NOTE — Telephone Encounter (Signed)
I HAVE CALLED THERE OFFICE AT [phone] LEFT MESSAGE FOR THEM TO PLEASE CALL ME BACK THEIR FAX NUMBER IS [phone]

## 2014-12-12 ENCOUNTER — Ambulatory Visit (INDEPENDENT_AMBULATORY_CARE_PROVIDER_SITE_OTHER): Payer: BC Managed Care – PPO | Admitting: Cardiovascular Disease

## 2014-12-12 ENCOUNTER — Encounter: Payer: Self-pay | Admitting: Cardiovascular Disease

## 2014-12-12 VITALS — BP 136/90 | HR 66 | Ht 68.0 in | Wt 181.5 lb

## 2014-12-12 DIAGNOSIS — I10 Essential (primary) hypertension: Secondary | ICD-10-CM | POA: Diagnosis not present

## 2014-12-12 DIAGNOSIS — E785 Hyperlipidemia, unspecified: Secondary | ICD-10-CM

## 2014-12-12 DIAGNOSIS — I517 Cardiomegaly: Secondary | ICD-10-CM | POA: Diagnosis not present

## 2014-12-12 NOTE — Progress Notes (Signed)
Terry Hays Date of Birth  1950-09-04   [address]     [place],   [address] [phone]  Fax  [phone]  Problem List 1. Hypertension 2. LVH 3. Hyperlipidemia 4.   History of Present Illness:  Terry Hays is a middle-aged gentleman with a history of hypertension, left ventricular hypertrophy, Hypercholesterolemia and an abnormal EKG.  He's done very well since I last saw him one year ago. He still plays basketball on a regular basis. He's been saying little bit better hydrated and has noted that he is not having nearly as much dizziness. His energy levels have also been better.  June 10, 2012  Terry Hays is doing well.  He has had some orthopedic issues which has kept him out of the basketball court.  Jul 19, 2013:  Terry Hays is doing well.  He checks his blood pressure on regular basis at home and his readings are in the normal range. If it is a little high today in the office.  He remains very busy. He is doing a lot developing a [employer] . 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.  Oct. 5, 2016: Terry Hays is doing well. Still very active.  Plays basketball regularly . Still teaching at [employer].   No CP or dyspnea   Current Outpatient Prescriptions on File Prior to Visit  Medication Sig Dispense Refill  . ALPRAZolam (XANAX) 0.25 MG tablet TAKE 2 TABLETS TWICE A DAY AS NEEDED ANXIETY 100 tablet 1  . aspirin EC 81 MG tablet Take 1 tablet (81 mg total) by mouth daily.    Marland Kitchen atorvastatin (LIPITOR) 40 MG tablet TAKE 1 TABLET (40 MG TOTAL) BY MOUTH DAILY. (PATIENT MUST HAVE MED CHECK BEFORE ANYMORE REFILLS) 90 tablet 3  . losartan-hydrochlorothiazide (HYZAAR) 50-12.5 MG per tablet Take 1 tablet by mouth daily. 90 tablet 2  . mometasone (NASONEX) 50 MCG/ACT nasal spray Place 2 sprays into the nose as needed.     No current facility-administered medications on file prior to visit.    Allergies  Allergen Reactions  . Tramadol     "hallucinations"    Past  Medical History  Diagnosis Date  . LVH (left ventricular hypertrophy)   . Dyslipidemia   . Abnormal ECG   . Allergy   . Anxiety     FLYING  . Hyperlipidemia   . Diverticulosis   . History of prostate cancer     Past Surgical History  Procedure Laterality Date  . Eye surgery  2001    CATOACTS BOTH  . Hernia repair  1954    R INGHINAL  . Prostate surgery  2003    PROSTRATECTOMY  . Colonoscopy  2004 2011    MAGOD  . Penile prosthesis implant  2011    DAHLSTEDT    History  Smoking status  . Former Smoker  . Quit date: 03/09/1976  Smokeless tobacco  . Not on file    History  Alcohol Use  . 0.6 oz/week  . 1 Glasses of wine per week    Family History  Problem Relation Age of Onset  . Heart attack    . Hyperlipidemia    . Cancer      Reviw of Systems:  Reviewed in the HPI.  All other systems are negative.  Physical Exam: BP 136/90 mmHg  Pulse 66  Ht 5\' 8"  (1.727 m)  Wt 82.328 kg (181 lb 8 oz)  BMI 27.60 kg/m2 The patient is  alert and oriented x 3.  The mood and affect are normal.  The skin is warm and dry.  Color is normal.  The HEENT exam reveals that the sclera are nonicteric.  The mucous membranes are moist.  The carotids are 2+ without bruits.  There is no thyromegaly.  There is no JVD.  The lungs are clear.  The chest wall is non tender.  The heart exam reveals a regular rate with a normal S1 and S2.  There are no murmurs, gallops, or rubs.  The PMI is not displaced.   Abdominal exam reveals good bowel sounds.  There is no guarding or rebound.  There is no hepatosplenomegaly or tenderness.  There are no masses.  Exam of the legs reveal no clubbing, cyanosis, or edema.  The legs are without rashes.  The distal pulses are intact.  Cranial nerves II - XII are intact.  Motor and sensory functions are intact.  The gait is normal.  ECG: Oct. 5, 2016:   NSR at 66.  T wave inversion anterior laterally    Assessment / Plan:   1. Hypertension- his blood pressure is  a little bit elevated today. He's been on vacation and has probably been eating a bit more salt than he normally does. I've recommended that he keep a blood pressure log. He states that his blood pressure is typically in the normal range. We'll continue with his current medications. His labs are stable.  2. LVH  3. Hyperlipidemia- his lipids are well controlled. Continue atorvastatin. His labs were drawn at his primary medical doctor's office. Continue current medications.  I'll see him again in one year.    Nahser, Wonda Cheng, MD  12/12/2014 8:30 AM    [address],  [place], [place]  [address] Pager [phone] Phone: [phone]; Fax: [phone]   [facility]  [address] [place], [place]  [address] [phone]   Fax [phone]

## 2014-12-12 NOTE — Patient Instructions (Signed)
Medication Instructions:  Your physician recommends that you continue on your current medications as directed. Please refer to the Current Medication list given to you today.   Labwork: Your physician recommends that you return for lab work in: 1 year on the day of or a few days before your office visit with Dr. Nahser.  You will need to FAST for this appointment - nothing to eat or drink after midnight the night before except water.    Testing/Procedures: None Ordered   Follow-Up: Your physician wants you to follow-up in: 1 year with Dr. Nahser.  You will receive a reminder letter in the mail two months in advance. If you don't receive a letter, please call our office to schedule the follow-up appointment.    

## 2015-02-26 ENCOUNTER — Encounter: Payer: Self-pay | Admitting: Family Medicine

## 2015-02-26 ENCOUNTER — Telehealth: Payer: Self-pay | Admitting: Family Medicine

## 2015-02-26 ENCOUNTER — Ambulatory Visit (INDEPENDENT_AMBULATORY_CARE_PROVIDER_SITE_OTHER): Payer: BC Managed Care – PPO | Admitting: Family Medicine

## 2015-02-26 VITALS — BP 100/60 | HR 78 | Resp 14 | Wt 183.6 lb

## 2015-02-26 DIAGNOSIS — G5701 Lesion of sciatic nerve, right lower limb: Secondary | ICD-10-CM | POA: Diagnosis not present

## 2015-02-26 MED ORDER — ALPRAZOLAM 0.25 MG PO TABS: ORAL_TABLET | ORAL | Status: DC

## 2015-02-26 NOTE — Telephone Encounter (Signed)
Called in per [place], pt going on vacation and will be flying

## 2015-02-26 NOTE — Progress Notes (Signed)
   Subjective:    Patient ID: Terry Hays, male    DOB: [birth year], 64 y.o.   MRN: [id number]  HPI He is being followed by physical therapy and treated for her pharmacist syndrome. He has been doing stretching exercises which help a lot however the pain is not completely gone. He is interested in getting a steroid injection.   Review of Systems     Objective:   Physical Exam Alert and in no distress otherwise not examined       Assessment & Plan:    Piriformis syndrome of right side  I discussed the diagnosis of pure form of syndrome and potential injection. He initially thought he can he get a systemic injection and I explained that that would really not be appropriate [place] necessarily an injection into the form is area especially since it is near the eye [place] nerve. Recommend he continue with conservative physical therapy as well as massage. If this is not successful, I will then refer for further ultrasound evaluation and possible injection. He is comfortable with that. Also his Xanax was renewed as he does have a fear of flying and has a trip planned in the near future.

## 2015-05-02 ENCOUNTER — Telehealth: Payer: Self-pay | Admitting: Family Medicine

## 2015-05-02 NOTE — Telephone Encounter (Signed)
Pt called and made a cpe appt for April 4. He needs refills of lipitor and xanax until then. Pt can be reached at [phone].

## 2015-05-03 ENCOUNTER — Other Ambulatory Visit: Payer: Self-pay | Admitting: Family Medicine

## 2015-05-03 MED ORDER — ALPRAZOLAM 0.25 MG PO TABS: ORAL_TABLET | ORAL | Status: DC

## 2015-05-03 MED ORDER — ATORVASTATIN CALCIUM 40 MG PO TABS: ORAL_TABLET | ORAL | Status: DC

## 2015-05-03 NOTE — Telephone Encounter (Signed)
Is this ok to refill?  

## 2015-05-03 NOTE — Telephone Encounter (Signed)
Call out 64mo of Alprazolam and Atorvastatin

## 2015-05-03 NOTE — Telephone Encounter (Signed)
Pt called to make sure that his Atorvastatin and Alprazolam will be sent to the pharmacy today because he need it today. Pt was at the pharmacy to pick up med when he called but will check with pharmacy again later today.

## 2015-05-03 NOTE — Telephone Encounter (Signed)
Sent atorvastatin electronically and called in xanax

## 2015-05-29 ENCOUNTER — Encounter: Payer: Self-pay | Admitting: Family Medicine

## 2015-05-29 ENCOUNTER — Ambulatory Visit (INDEPENDENT_AMBULATORY_CARE_PROVIDER_SITE_OTHER): Payer: BC Managed Care – PPO | Admitting: Family Medicine

## 2015-05-29 VITALS — BP 128/68 | HR 64 | Temp 98.1°F | Wt 184.2 lb

## 2015-05-29 DIAGNOSIS — R6889 Other general symptoms and signs: Secondary | ICD-10-CM | POA: Diagnosis not present

## 2015-05-29 DIAGNOSIS — R05 Cough: Secondary | ICD-10-CM

## 2015-05-29 DIAGNOSIS — R059 Cough, unspecified: Secondary | ICD-10-CM

## 2015-05-29 LAB — POC INFLUENZA A&B (BINAX/QUICKVUE)
Influenza A, POC: NEGATIVE
Influenza B, POC: NEGATIVE

## 2015-05-29 MED ORDER — AZITHROMYCIN 250 MG PO TABS: ORAL_TABLET | ORAL | Status: DC

## 2015-05-29 MED ORDER — PROMETHAZINE-DM 6.25-15 MG/5ML PO SYRP: 5.0000 mL | ORAL_SOLUTION | Freq: Every evening | ORAL | Status: DC | PRN

## 2015-05-29 MED ORDER — ALBUTEROL SULFATE HFA 108 (90 BASE) MCG/ACT IN AERS: 2.0000 | INHALATION_SPRAY | Freq: Four times a day (QID) | RESPIRATORY_TRACT | Status: DC | PRN

## 2015-05-29 NOTE — Progress Notes (Signed)
Subjective:  Terry Hays is a 65 y.o. male who presents for a 6-7 day history of intermittent fever, runny nose, cough, chest congestion, fatigue. He states he thought he was getting better and went to work 2 days ago and then he actually got worse.  History of bronchitis, no pneumonia. Did get flu shot.    Denies chest pain, palpitations, ear pain, sore throat, nausea, vomiting, diarrhea, no LE edema.  Treatment to date: cough suppressants and decongestants.  Positive sick contacts.  No other aggravating or relieving factors.  No other c/o. No recent antibiotic use.   ROS as in subjective.   Objective: Filed Vitals:   05/29/15 1422  BP: 128/68  Pulse: 64  Temp: 98.1 F (36.7 C)    General appearance: Alert, WD/WN, no distress, mildly ill appearing                             Skin: warm, no rash                           Head: no sinus tenderness                            Eyes: conjunctiva normal, corneas clear, PERRLA                            Ears: pearly TMs, external ear canals normal                          Nose: septum midline, turbinates swollen, with erythema and clear discharge             Mouth/throat: MMM, tongue normal, mild pharyngeal erythema                           Neck: supple, no adenopathy, no thyromegaly, nontender                          Heart: RRR, normal S1, S2, no murmurs                         Lungs: CTA bilaterally, no wheezes, rales, or rhonchi    Flu swab negative   Assessment: Flu-like symptoms - Plan: POC Influenza A&B(BINAX/QUICKVUE)  Cough - Plan: azithromycin (ZITHROMAX Z-PAK) 250 MG tablet, promethazine-dextromethorphan (PROMETHAZINE-DM) 6.25-15 MG/5ML syrup, albuterol (PROVENTIL HFA;VENTOLIN HFA) 108 (90 Base) MCG/ACT inhaler   Plan: Discussed diagnosis and treatment of URI. Suspect bacterial infection based on worsening symptoms and length of illness.  Z-pak prescribed.  Suggested symptomatic OTC remedies. Robitussin-DM, Mucinex  DM for daytime coughing congestion and promethazine DM as needed at bedtime for cough. Instructions provided that this medication is sedating and he should avoid driving and drink alcohol with it. Albuterol inhaler prescribed for cough and wheezing. He has used this in past.  Nasal saline spray for congestion.  Tylenol or Ibuprofen OTC for fever and malaise.  Call/return if not back to baseline after completing antibiotic.

## 2015-05-29 NOTE — Patient Instructions (Signed)
Your flu swab was negative. Start the antibiotic today. Treat your symptoms by taking Tylenol for fever or aches, take Delsym, Mucinex DM or Robitussin-DM for cough during the day and then at night you can take Promethazine DM for be aware that this medication is sedating and you should avoid driving or drinking alcohol with it. I also prescribed an albuterol inhaler for you to use as needed for coughing and wheezing. stay well hyrdated The antibiotic is for 5 days however it continues working for 10 days. Let me know if you're not back to baseline after the 10 days. Or, if you get worse, call me.  Cough, Adult Coughing is a reflex that clears your throat and your airways. Coughing helps to heal and protect your lungs. It is normal to cough occasionally, but a cough that happens with other symptoms or lasts a long time may be a sign of a condition that needs treatment. A cough may last only 2-3 weeks (acute), or it may last longer than 8 weeks (chronic). CAUSES Coughing is commonly caused by:  Breathing in substances that irritate your lungs.  A viral or bacterial respiratory infection.  Allergies.  Asthma.  Postnasal drip.  Smoking.  Acid backing up from the stomach into the esophagus (gastroesophageal reflux).  Certain medicines.  Chronic lung problems, including COPD (or rarely, lung cancer).  Other medical conditions such as heart failure. HOME CARE INSTRUCTIONS  Pay attention to any changes in your symptoms. Take these actions to help with your discomfort:  Take medicines only as told by your health care provider.  If you were prescribed an antibiotic medicine, take it as told by your health care provider. Do not stop taking the antibiotic even if you start to feel better.  Talk with your health care provider before you take a cough suppressant medicine.  Drink enough fluid to keep your urine clear or pale yellow.  If the air is dry, use a cold steam vaporizer or humidifier  in your bedroom or your home to help loosen secretions.  Avoid anything that causes you to cough at work or at home.  If your cough is worse at night, try sleeping in a semi-upright position.  Avoid cigarette smoke. If you smoke, quit smoking. If you need help quitting, ask your health care provider.  Avoid caffeine.  Avoid alcohol.  Rest as needed. SEEK MEDICAL CARE IF:   You have new symptoms.  You cough up pus.  Your cough does not get better after 2-3 weeks, or your cough gets worse.  You cannot control your cough with suppressant medicines and you are losing sleep.  You develop pain that is getting worse or pain that is not controlled with pain medicines.  You have a fever.  You have unexplained weight loss.  You have night sweats. SEEK IMMEDIATE MEDICAL CARE IF:  You cough up blood.  You have difficulty breathing.  Your heartbeat is very fast.   This information is not intended to replace advice given to you by your health care provider. Make sure you discuss any questions you have with your health care provider.   Document Released: 08/22/2010 Document Revised: 11/14/2014 Document Reviewed: 05/02/2014 Elsevier Interactive Patient Education [employer].

## 2015-06-06 ENCOUNTER — Telehealth: Payer: Self-pay | Admitting: Family Medicine

## 2015-06-06 MED ORDER — PREDNISONE 10 MG (21) PO TBPK: ORAL_TABLET | ORAL | Status: DC

## 2015-06-06 NOTE — Telephone Encounter (Signed)
Spoke with pt and he said no fever. He is having trouble coughing up stuff as he feels its just sitting there. He doesn't want to get a xray right now and wants to wait for it. What can you advise him to do

## 2015-06-06 NOTE — Telephone Encounter (Signed)
Please call and find out if he is still having fever, does he have a productive cough? We can send him for a chest XR to rule out pneumonia if he is not getting any better?  Find out what exactly is bothering him the most so I can call in appropriate antibiotic if needed.  nasonex is OTC, does he need prescription for this?

## 2015-06-06 NOTE — Telephone Encounter (Signed)
I recommend that he continue treating his underlying allergies. Also, he should use his albuterol inhaler as needed for coughing and wheezing. We can try a short course of steroids and see if we can get this calmed down.  What percentage better is he? Let me know if he wants to try steroids.

## 2015-06-06 NOTE — Telephone Encounter (Signed)
Pt was notified.  

## 2015-06-06 NOTE — Telephone Encounter (Signed)
He can take the 5 pills tomorrow and go from there. 5 then 4 then 3, 2 and 1. He will have some extra pills left over and this is ok.

## 2015-06-06 NOTE — Telephone Encounter (Signed)
Pt called and said pharmacist said since so late just to take 2 tablets tonight with food.  Pt wants to know what he is supposed to take tomorrow.  I asked him did the pharmacist tell him and he said no.  Please call pt [phone]

## 2015-06-06 NOTE — Telephone Encounter (Signed)
Pt said still sick feeling lousy and chest still full.  Wants rx for Nasanax, another antibiotic to [facility].

## 2015-06-06 NOTE — Telephone Encounter (Signed)
Sent in steriod pack per vickie

## 2015-06-11 ENCOUNTER — Encounter: Payer: BC Managed Care – PPO | Admitting: Family Medicine

## 2015-07-01 ENCOUNTER — Other Ambulatory Visit: Payer: Self-pay | Admitting: Medical

## 2015-07-01 NOTE — Telephone Encounter (Signed)
Is this ok to refill?  

## 2015-07-05 ENCOUNTER — Other Ambulatory Visit: Payer: Self-pay | Admitting: Medical

## 2015-07-08 NOTE — Telephone Encounter (Signed)
Looks like this is your pt not [employer]

## 2015-07-08 NOTE — Telephone Encounter (Signed)
Is this ok to refill?  

## 2015-07-11 ENCOUNTER — Telehealth: Payer: Self-pay | Admitting: Family Medicine

## 2015-07-11 ENCOUNTER — Other Ambulatory Visit: Payer: Self-pay

## 2015-07-11 MED ORDER — ALPRAZOLAM 0.25 MG PO TABS: ORAL_TABLET | ORAL | Status: DC

## 2015-07-11 NOTE — Telephone Encounter (Signed)
Called in.

## 2015-07-11 NOTE — Telephone Encounter (Signed)
Pt requesting refill on Alprazolam 0.25 mg

## 2015-07-11 NOTE — Telephone Encounter (Signed)
Okay to renew

## 2015-07-11 NOTE — Telephone Encounter (Signed)
Called in per jcl 

## 2015-07-25 ENCOUNTER — Ambulatory Visit (INDEPENDENT_AMBULATORY_CARE_PROVIDER_SITE_OTHER): Payer: BC Managed Care – PPO | Admitting: Family Medicine

## 2015-07-25 ENCOUNTER — Encounter: Payer: Self-pay | Admitting: Family Medicine

## 2015-07-25 VITALS — BP 124/80 | HR 51 | Ht 68.0 in | Wt 183.6 lb

## 2015-07-25 DIAGNOSIS — Z8546 Personal history of malignant neoplasm of prostate: Secondary | ICD-10-CM | POA: Diagnosis not present

## 2015-07-25 DIAGNOSIS — J302 Other seasonal allergic rhinitis: Secondary | ICD-10-CM

## 2015-07-25 DIAGNOSIS — E785 Hyperlipidemia, unspecified: Secondary | ICD-10-CM

## 2015-07-25 DIAGNOSIS — Z0000 Encounter for general adult medical examination without abnormal findings: Secondary | ICD-10-CM | POA: Diagnosis not present

## 2015-07-25 DIAGNOSIS — I10 Essential (primary) hypertension: Secondary | ICD-10-CM | POA: Diagnosis not present

## 2015-07-25 DIAGNOSIS — Z8249 Family history of ischemic heart disease and other diseases of the circulatory system: Secondary | ICD-10-CM | POA: Diagnosis not present

## 2015-07-25 DIAGNOSIS — K649 Unspecified hemorrhoids: Secondary | ICD-10-CM | POA: Diagnosis not present

## 2015-07-25 DIAGNOSIS — Z1159 Encounter for screening for other viral diseases: Secondary | ICD-10-CM | POA: Diagnosis not present

## 2015-07-25 DIAGNOSIS — I517 Cardiomegaly: Secondary | ICD-10-CM

## 2015-07-25 DIAGNOSIS — F40243 Fear of flying: Secondary | ICD-10-CM

## 2015-07-25 LAB — POCT URINALYSIS DIPSTICK
BILIRUBIN UA: NEGATIVE
Blood, UA: NEGATIVE
GLUCOSE UA: NEGATIVE
Ketones, UA: NEGATIVE
LEUKOCYTES UA: NEGATIVE
NITRITE UA: NEGATIVE
Protein, UA: NEGATIVE
Spec Grav, UA: 1.015
Urobilinogen, UA: NEGATIVE
pH, UA: 6.5

## 2015-07-25 LAB — CBC WITH DIFFERENTIAL/PLATELET
Basophils Absolute: 0 cells/uL (ref 0–200)
Basophils Relative: 0 %
EOS ABS: 171 {cells}/uL (ref 15–500)
Eosinophils Relative: 3 %
HEMATOCRIT: 48.5 % (ref 38.5–50.0)
Hemoglobin: 16.5 g/dL (ref 13.2–17.1)
Lymphocytes Relative: 20 %
Lymphs Abs: 1140 cells/uL (ref 850–3900)
MCH: 29.7 pg (ref 27.0–33.0)
MCHC: 34 g/dL (ref 32.0–36.0)
MCV: 87.4 fL (ref 80.0–100.0)
MONO ABS: 627 {cells}/uL (ref 200–950)
MPV: 10.3 fL (ref 7.5–12.5)
Monocytes Relative: 11 %
NEUTROS ABS: 3762 {cells}/uL (ref 1500–7800)
Neutrophils Relative %: 66 %
PLATELETS: 215 10*3/uL (ref 140–400)
RBC: 5.55 MIL/uL (ref 4.20–5.80)
RDW: 15 % (ref 11.0–15.0)
WBC: 5.7 10*3/uL (ref 4.0–10.5)

## 2015-07-25 LAB — LIPID PANEL
Cholesterol: 159 mg/dL (ref 125–200)
HDL: 51 mg/dL (ref >=40)
LDL Cholesterol: 86 mg/dL (ref <130)
Total CHOL/HDL Ratio: 3.1 Ratio (ref <=5.0)
Triglycerides: 111 mg/dL (ref <150)
VLDL: 22 mg/dL (ref <30)

## 2015-07-25 LAB — COMPREHENSIVE METABOLIC PANEL
ALK PHOS: 64 U/L (ref 40–115)
ALT: 38 U/L (ref 9–46)
AST: 27 U/L (ref 10–35)
Albumin: 4.3 g/dL (ref 3.6–5.1)
BUN: 14 mg/dL (ref 7–25)
CALCIUM: 9.7 mg/dL (ref 8.6–10.3)
CO2: 24 mmol/L (ref 20–31)
Chloride: 105 mmol/L (ref 98–110)
Creat: 0.92 mg/dL (ref 0.70–1.25)
Glucose, Bld: 84 mg/dL (ref 65–99)
POTASSIUM: 4.1 mmol/L (ref 3.5–5.3)
Sodium: 140 mmol/L (ref 135–146)
TOTAL PROTEIN: 6.6 g/dL (ref 6.1–8.1)
Total Bilirubin: 0.9 mg/dL (ref 0.2–1.2)

## 2015-07-25 MED ORDER — LOSARTAN POTASSIUM-HCTZ 50-12.5 MG PO TABS: 1.0000 | ORAL_TABLET | Freq: Every day | ORAL | Status: DC

## 2015-07-25 MED ORDER — ATORVASTATIN CALCIUM 40 MG PO TABS: ORAL_TABLET | ORAL | Status: DC

## 2015-07-25 NOTE — Progress Notes (Signed)
Subjective:    Patient ID: Terry Hays, male    DOB: [birth year], 65 y.o.   MRN: [id number]  HPI  he is here for complete examination. He does have underlying hyperlipidemia and is having no difficulty with his Lipitor. He does have high blood pressure and is doing well on his present medication. He has had previous evaluation of knee does have evidence of LVH which is probably more from his vigorous physical activities. There is a family history of heart disease. He does have allergies that are under good control. His hemorrhoids give him very little difficulty. He does have a fear of flying and does use Xanax to help this. Recently he has been under stress due to work-related issues and finances. He seems to have worked this out. He was diagnosed with prostate cancer in 2003 presently is doing quite well. Family and social history as well as health maintenance and immunizations were reviewed. He's had no other concerns or complaints specifically chest pain, shortness of breath, GI issues. He is quite physically active playing in an adult basketball league.   Review of Systems  All other systems reviewed and are negative.      Objective:   Physical Exam BP 124/80 mmHg  Pulse 51  Ht 5\' 8"  (1.727 m)  Wt 183 lb 9.6 oz (83.28 kg)  BMI 27.92 kg/m2  SpO2 97%  General Appearance:    Alert, cooperative, no distress, appears stated age  Head:    Normocephalic, without obvious abnormality, atraumatic  Eyes:    PERRL, conjunctiva/corneas clear, EOM's intact, fundi    benign  Ears:    Normal TM's and external ear canals  Nose:   Nares normal, mucosa normal, no drainage or sinus   tenderness  Throat:   Lips, mucosa, and tongue normal; teeth and gums normal  Neck:   Supple, no lymphadenopathy;  thyroid:  no   enlargement/tenderness/nodules; no carotid   bruit or JVD  Back:    Spine nontender, no curvature, ROM normal, no CVA     tenderness  Lungs:     Clear to auscultation bilaterally  without wheezes, rales or     ronchi; respirations unlabored  Chest Wall:    No tenderness or deformity   Heart:    Regular rate and rhythm, S1 and S2 normal, no murmur, rub   or gallop     Abdomen:     Soft, non-tender, nondistended, normoactive bowel sounds,    no masses, no hepatosplenomegaly     Rectal:      redundant anal tissue noted.  Extremities:   No clubbing, cyanosis or edema  Pulses:   2+ and symmetric all extremities  Skin:   Skin color, texture, turgor normal, no rashes or lesions  Lymph nodes:   Cervical, supraclavicular, and axillary nodes normal  Neurologic:   CNII-XII intact, normal strength, sensation and gait; reflexes 2+ and symmetric throughout          Psych:   Normal mood, affect, hygiene and grooming.          Assessment & Plan:  Routine general medical examination at a health care facility - Plan: POCT Urinalysis Dipstick, CBC with Differential/Platelet, Comprehensive metabolic panel, Lipid panel  Dyslipidemia - Plan: Lipid panel, atorvastatin (LIPITOR) 40 MG tablet  LVH (left ventricular hypertrophy) - Plan: CBC with Differential/Platelet, Comprehensive metabolic panel  Essential hypertension - Plan: CBC with Differential/Platelet, Comprehensive metabolic panel, losartan-hydrochlorothiazide (HYZAAR) 50-12.5 MG tablet  History of prostate cancer  Fear of flying  Family history of heart disease in male family member before age 28 - Plan: CBC with Differential/Platelet, Comprehensive metabolic panel, Lipid panel  Allergic rhinitis, seasonal  Hemorrhoids, unspecified hemorrhoid type  Need for hepatitis C screening test - Plan: Hepatitis C antibody  I encouraged him to continue to take good care of himself.

## 2015-07-26 LAB — HEPATITIS C ANTIBODY: HCV Ab: NEGATIVE

## 2015-07-28 ENCOUNTER — Other Ambulatory Visit: Payer: Self-pay | Admitting: Medical

## 2015-09-17 ENCOUNTER — Other Ambulatory Visit: Payer: Self-pay | Admitting: Family Medicine

## 2015-09-17 ENCOUNTER — Other Ambulatory Visit: Payer: Self-pay

## 2015-09-17 NOTE — Telephone Encounter (Signed)
ok 

## 2015-09-17 NOTE — Telephone Encounter (Signed)
Called in xaanax

## 2015-09-17 NOTE — Telephone Encounter (Signed)
Is this okay?

## 2015-09-23 ENCOUNTER — Ambulatory Visit (INDEPENDENT_AMBULATORY_CARE_PROVIDER_SITE_OTHER): Payer: BC Managed Care – PPO | Admitting: Family Medicine

## 2015-09-23 ENCOUNTER — Encounter: Payer: Self-pay | Admitting: Family Medicine

## 2015-09-23 VITALS — BP 130/88 | HR 62 | Wt 182.0 lb

## 2015-09-23 DIAGNOSIS — M25561 Pain in right knee: Secondary | ICD-10-CM | POA: Diagnosis not present

## 2015-09-23 DIAGNOSIS — M5489 Other dorsalgia: Secondary | ICD-10-CM

## 2015-09-23 MED ORDER — DICLOFENAC SODIUM 75 MG PO TBEC: 75.0000 mg | DELAYED_RELEASE_TABLET | Freq: Two times a day (BID) | ORAL | Status: DC

## 2015-09-23 NOTE — Progress Notes (Signed)
   Subjective:    Patient ID: Terry Hays, male    DOB: 04-04-1950, 65 y.o.   MRN: [id number]  HPI He complains of a several week history of right gluteal pain especially when driving long distances. He also has been having right knee pain laterally. He has a previous history of ITB. No history of recent injury or overuse. These have interfered with his functioning especially with playing basketball. 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 No numbness, tingling or weakness. He apparently did have the ITB injected last year which did help. He is getting ready to go on a 10 day trip and has concerns over the pain interfering with this trip.  Review of Systems     Objective:   Physical Exam Exam of the back shows no tenderness over the SI joint. Tenderness to palpation is noted over the sciatic notch area negative straight leg raising. Normal DTRs. Normal hip motion. Exam of the right knee shows no effusion. He does have some tenderness to palpation over the lateral knee but not over the joint line. Varus stress on the knee with extension did cause some discomfort laterally. McMurray's testing negative. Negative anterior drawer.       Assessment & Plan:  Right knee pain - Plan: diclofenac (VOLTAREN) 75 MG EC tablet  Other back pain I think his ITB is again giving him difficulty. I also have concerns over the pain he is having in the sciatic notch. I think it would be wise to have him evaluated by Dr. Oneida Alar and possibly having ultrasound to better evaluate these issues and consider therapeutic options at that time. I left a message on Dr Oneida Alar' cell phone and also schedule an appointment for him tomorrow morning. He unfortunately is on a tight schedule a plane flight around 10:30. He has an appointment at 9 and I recommended that he get their early.

## 2015-09-24 ENCOUNTER — Ambulatory Visit: Payer: BC Managed Care – PPO | Admitting: Sports Medicine

## 2015-10-03 ENCOUNTER — Telehealth: Payer: Self-pay

## 2015-10-03 NOTE — Telephone Encounter (Signed)
Per Dr. Redmond School- Schedule pt appt for Dr. Oneida Alar. Pt had to fly out of town at last appt with Dr. Oneida Alar. Call to reschedule this.  Their office is closed for lunch from 1230-130. Victorino December

## 2015-10-03 NOTE — Telephone Encounter (Signed)
August 3rd @ 8:30 Dr. Micheline Chapman. Pt aware of appt. Offered next available appt with Dr. Oneida Alar on August 17th, but pt states he was not able to make that appt.

## 2015-10-10 ENCOUNTER — Encounter: Payer: Self-pay | Admitting: Sports Medicine

## 2015-10-10 ENCOUNTER — Ambulatory Visit
Admission: RE | Admit: 2015-10-10 | Discharge: 2015-10-10 | Disposition: A | Discharge status: Home / Self Care | Payer: BC Managed Care – PPO | Source: Ambulatory Visit | Attending: Sports Medicine | Admitting: Sports Medicine

## 2015-10-10 ENCOUNTER — Ambulatory Visit (INDEPENDENT_AMBULATORY_CARE_PROVIDER_SITE_OTHER): Payer: BC Managed Care – PPO | Admitting: Sports Medicine

## 2015-10-10 DIAGNOSIS — M5416 Radiculopathy, lumbar region: Secondary | ICD-10-CM

## 2015-10-10 DIAGNOSIS — M25561 Pain in right knee: Secondary | ICD-10-CM

## 2015-10-10 MED ORDER — DICLOFENAC SODIUM 75 MG PO TBEC: 75.0000 mg | DELAYED_RELEASE_TABLET | Freq: Two times a day (BID) | ORAL | 0 refills | Status: DC

## 2015-10-10 NOTE — Progress Notes (Signed)
Subjective:    Patient ID: Terry Hays, male    DOB: 04-Apr-1950, 65 y.o.   MRN: [id number]  HPI chief complaint: Right leg pain  Very pleasant 65 year old male comes in today complaining of 2 years of intermittent right hip and leg pain. Symptoms all began after he fell while playing basketball. He landed on his right hip and shortly thereafter began to experience pain along the lateral aspect of his right lower leg. He then began to experience pain along the lateral knee and lateral hip as well as into his posterior hip. He had an MRI scan done of his right knee which revealed a torn meniscus but it was felt that his main problem was his iliotibial band. He started working with [facility] specifically on his iliotibial band. In fact, he worked with Jenny Reichmann for over 8 months on this issue. Despite exhaustive physical therapy his symptoms persist. His pain is primarily localized along the lateral thigh and knee. He is no longer getting pain in his lower leg. He does still have pain in the posterior right hip. He denies any numbness or tingling. He denies any groin pain. His pain is much worse with sitting and improves with standing and walking. He has had 2 specific episodes of the right leg giving way and he also notes that he is unable to aggressively push off of his right leg to travel up court while playing basketball. He has not had any imaging of his lumbar spine. He was given a prescription for diclofenac which does provide him with some temporary relief. No prior low back surgeries.  Past medical history reviewed Medications reviewed Allergies reviewed    Review of Systems    as above Objective:   Physical Exam  Well-developed, well-nourished. No acute distress. Awake alert and oriented 3. Vital signs reviewed  Lumbar spine: Full lumbar range of motion. He has pain with the extremes of both forward flexion and extension. No tenderness to palpation along the lumbar midline. No  spasm. No tenderness over the SI joint. Right hip: Smooth painless hip range of motion with a negative logroll. No tenderness over the greater trochanteric bursa. Neurological exam: 4/5 strength with resisted hip flexion on the right. Remainder of his strength is 5/5 in both lower extremities but the patient gets weakness and fatigue on the right when ambulating on his tiptoes. No atrophy. Sensation is grossly intact to light touch. Reflexes are trace but equal at the Achilles and patellar tendons bilaterally.  X-rays of the lumbar spine including AP and lateral views are reviewed. Patient has multilevel degenerative changes especially at L5-S1 where there is significant disc space narrowing and facet arthropathy. Nothing acute is seen.      Assessment & Plan:   Chronic right leg pain likely secondary to lumbar radiculopathy  Although the patient has been diagnosed with iliotibial band in the past, I explained to him that iliotibial band syndrome does not tend to be a chronic reoccurring issue especially if treated with exhaustive physical therapy. His history and physical exam findings suggest lumbar radiculopathy as the cause. He definitely has degenerative changes on his x-ray. I discussed working this up further with an MRI versus a return to physical therapy, this time treating his degenerative disc disease and probable lumbar disc bulge. Patient would like to return to [facility] for some physical therapy. He would also like a refill on his diclofenac. I will see him back in the office in 4 weeks for  reevaluation.

## 2015-11-06 ENCOUNTER — Encounter: Payer: Self-pay | Admitting: Sports Medicine

## 2015-11-06 ENCOUNTER — Ambulatory Visit (INDEPENDENT_AMBULATORY_CARE_PROVIDER_SITE_OTHER): Payer: BC Managed Care – PPO | Admitting: Sports Medicine

## 2015-11-06 VITALS — BP 151/87 | Ht 68.0 in | Wt 175.0 lb

## 2015-11-06 DIAGNOSIS — M5416 Radiculopathy, lumbar region: Secondary | ICD-10-CM | POA: Diagnosis not present

## 2015-11-06 NOTE — Progress Notes (Signed)
   Subjective:    Patient ID: Terry Hays, male    DOB: 16-Aug-1950, 65 y.o.   MRN: [id number]  HPI   Patient comes in today for follow-up on chronic right hip and leg pain. He's been working with physical therapy. The physical therapist feels like he may have some hip arthritis as well. Recent x-rays of his lumbar spine show multilevel degenerative changes with disc space narrowing at L2-L3 and L5-S1. Patient continues to endorse pain that is worse with sitting. Starts in the posterior right hip and at times were radiate down the right leg into the right lower leg and foot. Symptoms improve with standing and walking. He still denies numbness and tingling. Diclofenac has not been working as well for him as Aleve. He denies any groin pain.    Review of Systems     Objective:   Physical Exam Well developed, well-nourished. No acute distress  Right hip: He does have some slight decrease of internal rotation but this does not reproduce any groin pain. Full external rotation. He continues to have 4/5 strength with resisted hip flexion. Also 4/5 strength with resisted hip abduction.  Neurological exam is basically unchanged from his exam one month ago. Reflexes are equal at the Achilles and patellar tendons. Strength is 5/5 in both lower extremities.  X-rays of the lumbar spine are as above. Visualized portion of the right hip shows mild degenerative changes.       Assessment & Plan:   Chronic right hip and leg pain likely secondary to lumbar radiculopathy from degenerative disc disease Mild right hip osteoarthritis  I agree that the patient has some mild osteoarthritis which may be responsible for some of his symptoms but the chronic ,which is worse sitting, coupled with the radiating pain past his knee into his foot along with the weakness and fatigue seen on both today's exam and his previous exam suggests to me that a lot of his symptoms may be from his lumbar spine. I discussed  working this up further with an MRI in anticipation of referring him for a diagnostic lumbar ESI. Patient would like to wait on that for now. I will instead give him some generalized hip strengthening exercises and he can continue working in physical therapy. He will continue using his Aleve as needed for pain and will follow-up with me again in 4 weeks for reevaluation.

## 2015-11-25 ENCOUNTER — Encounter: Payer: Self-pay | Admitting: Sports Medicine

## 2015-11-25 ENCOUNTER — Ambulatory Visit (INDEPENDENT_AMBULATORY_CARE_PROVIDER_SITE_OTHER): Payer: BC Managed Care – PPO | Admitting: Sports Medicine

## 2015-11-25 VITALS — BP 126/79 | Ht 68.0 in | Wt 175.0 lb

## 2015-11-25 DIAGNOSIS — M5416 Radiculopathy, lumbar region: Secondary | ICD-10-CM

## 2015-11-25 MED ORDER — PREDNISONE 10 MG PO TABS: ORAL_TABLET | ORAL | 0 refills | Status: DC

## 2015-11-25 NOTE — Progress Notes (Signed)
  Patient comes in today for follow-up. He is still struggling with right lower leg pain. He finds it very uncomfortable to sit for long periods of time. With sitting, he gets pain in the posterior right hip down the lateral right leg into the right lower leg. Symptoms improve with standing. He has been very diligent about working in physical therapy on getting stronger but despite this his symptoms are worsening. Recent x-rays of his right hip showed only some mild arthritic changes. X-rays of his lumbar spine showed multilevel degenerative changes. Physical exam was not repeated today. We simply talked about his ongoing dilemma. At this point in time I think we should get an MRI of his lumbar spine specifically to rule out a bulging lumbar disc or foraminal stenosis which may be responsible for his chronic right leg pain. Phone follow-up after that study to discuss those results. We discussed the possibility of a diagnostic/therapeutic lumbar ESI at [facility]. In the meantime, I've given him a 6 day Sterapred Dosepak to take if needed (he is leaving for a trip to [place] in a couple of weeks).  Total time spent with the patient was 15 minutes with greater than 50% of the time spent in face-to-face consultation discussing his ongoing posterior right hip/leg pain, workup, and potential treatment plan.

## 2015-11-28 ENCOUNTER — Encounter: Payer: Self-pay | Admitting: Sports Medicine

## 2015-11-29 ENCOUNTER — Ambulatory Visit
Admission: RE | Admit: 2015-11-29 | Discharge: 2015-11-29 | Disposition: A | Discharge status: Home / Self Care | Payer: BC Managed Care – PPO | Source: Ambulatory Visit | Attending: Sports Medicine | Admitting: Sports Medicine

## 2015-11-29 ENCOUNTER — Telehealth: Payer: Self-pay | Admitting: Sports Medicine

## 2015-11-29 DIAGNOSIS — M5416 Radiculopathy, lumbar region: Secondary | ICD-10-CM

## 2015-11-29 NOTE — Telephone Encounter (Signed)
  I spoke with the patient on the phone today after reviewing the MRI of his lumbar spine. Dominant finding is at L5-S1 where he has disc space narrowing and retrolisthesis. He has advanced right foraminal stenosis with L5 compression due to a facet spur. I believe that this is his pain generator. His pain became quite severe yesterday so he started his prednisone taper and as a result his symptoms had improved dramatically. He is leaving for a trip to [place] in a little over a week. I've asked that he call me next week with an update. I think he may ultimately benefit from a series of lumbar epidural steroid injections but we may not be able to get these done until he returns from his trip. I could consider a refill on his Sterapred Dosepak for him to take with him to [place] if needed.

## 2015-12-03 ENCOUNTER — Other Ambulatory Visit: Payer: Self-pay | Admitting: *Deleted

## 2015-12-03 ENCOUNTER — Other Ambulatory Visit: Payer: Self-pay | Admitting: Sports Medicine

## 2015-12-03 DIAGNOSIS — M545 Low back pain, unspecified: Secondary | ICD-10-CM

## 2015-12-03 MED ORDER — PREDNISONE 10 MG (21) PO TBPK: 10.0000 mg | ORAL_TABLET | Freq: Every day | ORAL | 0 refills | Status: DC

## 2015-12-05 ENCOUNTER — Other Ambulatory Visit: Payer: Self-pay | Admitting: Sports Medicine

## 2015-12-16 ENCOUNTER — Ambulatory Visit
Admission: RE | Admit: 2015-12-16 | Discharge: 2015-12-16 | Disposition: A | Discharge status: Home / Self Care | Payer: BC Managed Care – PPO | Source: Ambulatory Visit | Attending: Sports Medicine | Admitting: Sports Medicine

## 2015-12-16 ENCOUNTER — Other Ambulatory Visit: Payer: Self-pay | Admitting: Sports Medicine

## 2015-12-16 DIAGNOSIS — M545 Low back pain, unspecified: Secondary | ICD-10-CM

## 2015-12-16 MED ORDER — IOPAMIDOL (ISOVUE-M 200) INJECTION 41%
1.0000 mL | Freq: Once | INTRAMUSCULAR | Status: AC
  Administered 2015-12-16: 1 mL via EPIDURAL

## 2015-12-16 MED ORDER — METHYLPREDNISOLONE ACETATE 40 MG/ML INJ SUSP (RADIOLOG
120.0000 mg | Freq: Once | INTRAMUSCULAR | Status: AC
  Administered 2015-12-16: 120 mg via EPIDURAL

## 2015-12-16 NOTE — Discharge Instructions (Signed)

## 2015-12-19 ENCOUNTER — Ambulatory Visit (INDEPENDENT_AMBULATORY_CARE_PROVIDER_SITE_OTHER): Payer: BC Managed Care – PPO | Admitting: Sports Medicine

## 2015-12-19 ENCOUNTER — Encounter: Payer: Self-pay | Admitting: Sports Medicine

## 2015-12-19 VITALS — BP 136/73 | HR 68 | Ht 68.0 in | Wt 175.0 lb

## 2015-12-19 DIAGNOSIS — M48061 Spinal stenosis, lumbar region without neurogenic claudication: Secondary | ICD-10-CM

## 2015-12-20 NOTE — Progress Notes (Signed)
  Patient comes in today for follow-up. He is status post right-sided L4 nerve root block and transforaminal epidural. He is feeling much better. It is documented that he was able to sit pain-free immediately after his injection. Patient tells me that he is still able to sit relatively comfortably. Physical exam was not repeated today. We simply talked about his diagnosis of spinal stenosis and how it is causing his hip and right leg pain. He told me that immediately after the epidural he felt "out of sorts" for a couple of days. He was asking whether or not this was a common complaint after epidurals. I explained to him that it was not, and I encouraged him to call [facility] radiology and discuss this with them further. The patient has purchased a seat lift for his car which has helped as well. He is asking about returning to physical therapy as well as playing basketball. I think he may return to physical therapy but he needs to avoid any sort of exercise that involves lumbar flexion or hip flexion. In regards to basketball, I explained to him that there is a chance that running up and down the court and jumping could easily aggravate his condition. I also offered a referral to neurosurgery just in case his symptoms started to return. He would like to think about this and will get back to me. Follow-up with me as needed.  Total time spent with the patient was 15 minutes with greater than 50% of the time spent in face-to-face consultation discussing his diagnosis and prognosis.

## 2016-01-28 ENCOUNTER — Ambulatory Visit (INDEPENDENT_AMBULATORY_CARE_PROVIDER_SITE_OTHER): Payer: BC Managed Care – PPO | Admitting: Cardiovascular Disease

## 2016-01-28 ENCOUNTER — Encounter: Payer: Self-pay | Admitting: Cardiovascular Disease

## 2016-01-28 VITALS — BP 140/72 | HR 74 | Ht 68.0 in | Wt 183.8 lb

## 2016-01-28 DIAGNOSIS — I10 Essential (primary) hypertension: Secondary | ICD-10-CM

## 2016-01-28 MED ORDER — ASPIRIN EC 81 MG PO TBEC: 81.0000 mg | DELAYED_RELEASE_TABLET | Freq: Every day | ORAL | 3 refills | Status: AC

## 2016-01-28 MED ORDER — HYDROCHLOROTHIAZIDE 12.5 MG PO CAPS: 12.5000 mg | ORAL_CAPSULE | Freq: Every day | ORAL | 3 refills | Status: DC

## 2016-01-28 MED ORDER — LOSARTAN POTASSIUM 50 MG PO TABS: 50.0000 mg | ORAL_TABLET | Freq: Every day | ORAL | 3 refills | Status: DC

## 2016-01-28 NOTE — Patient Instructions (Signed)
Your physician has recommended you make the following change in your medication: Decrease Aspirin to 81mg  daily Stop losartan-HCTZ (Hyzaar)  Start Losartan 50mg  daily Start HCTZ 12.5 mg daily.  Your physician wants you to follow-up in: 1 year with Dr. Acie Fredrickson. You will receive a reminder letter in the mail two months in advance. If you don't receive a letter, please call our office to schedule the follow-up appointment.

## 2016-01-28 NOTE — Progress Notes (Signed)
Terry Hays Date of Birth  [birth year]   [address]     [place], [place]  [address] [phone]  Fax  [phone]  Problem List 1. Hypertension 2. LVH 3. Hyperlipidemia 4.   Terry Hays is a middle-aged gentleman with a history of hypertension, left ventricular hypertrophy, Hypercholesterolemia and an abnormal EKG.  He's done very well since I last saw him one year ago. He still plays basketball on a regular basis. He's been saying little bit better hydrated and has noted that he is not having nearly as much dizziness. His energy levels have also been better.  June 10, 2012  Terry Hays is doing well.  He has had some orthopedic issues which has kept him out of the basketball court.  Jul 19, 2013:  Terry Hays is doing well.  He checks his blood pressure on regular basis at home and his readings are in the normal range. If it is a little high today in the office.  He remains very busy. He is doing a lot developing a [employer] . 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.  Oct. 5, 2016: Terry Hays is doing well. Still very active.  Plays basketball regularly . Still teaching at [employer].   No CP or dyspnea   Nov. 21, 2017:  Doing well from a cardiac standpoint.   Is injurred - bad back .   Has not been playing basketball  Does get some core exercises and stretches.    Current Outpatient Prescriptions on File Prior to Visit  Medication Sig Dispense Refill  . ALPRAZolam (XANAX) 0.25 MG tablet TAKE 2 TABLETS BY MOUTH TWICE A DAY AS NEEDED FOR ANXIETY 60 tablet 1  . aspirin 325 MG EC tablet Take 325 mg by mouth daily.    Marland Kitchen atorvastatin (LIPITOR) 40 MG tablet TAKE 1 TABLET (40 MG TOTAL) BY MOUTH DAILY. (PATIENT MUST HAVE MED CHECK BEFORE ANYMORE REFILLS) 90 tablet 3  . FLUZONE HIGH-DOSE 0.5 ML SUSY     . losartan-hydrochlorothiazide (HYZAAR) 50-12.5 MG tablet Take 1 tablet by mouth daily. 90 tablet 3  . mometasone (NASONEX) 50 MCG/ACT nasal spray Place 2 sprays into the nose as  needed.    . naproxen sodium (ANAPROX) 220 MG tablet Take 220 mg by mouth 2 (two) times daily with a meal.     No current facility-administered medications on file prior to visit.     Allergies  Allergen Reactions  . Tramadol Other (See Comments)    "hallucinations"    Past Medical History:  Diagnosis Date  . Abnormal ECG   . Allergy   . Anxiety    FLYING  . Diverticulosis   . Dyslipidemia   . History of prostate cancer   . Hyperlipidemia   . LVH (left ventricular hypertrophy)     Past Surgical History:  Procedure Laterality Date  . COLONOSCOPY  2004 2011   MAGOD  . EYE SURGERY  2001   CATOACTS BOTH  . [place]  . PENILE PROSTHESIS IMPLANT  2011   DAHLSTEDT  . PROSTATE SURGERY  2003   PROSTRATECTOMY    History  Smoking Status  . Former Smoker  . Quit date: 03/09/1976  Smokeless Tobacco  . Never Used    History  Alcohol Use  . 0.6 oz/week  . 1 Glasses of wine per week    Family History  Problem Relation Age of Onset  . Heart attack    .  Hyperlipidemia    . Cancer      Reviw of Systems:  Reviewed in the HPI.  All other systems are negative.  Physical Exam: BP 140/72   Pulse 74   Ht 5\' 8"  (1.727 m)   Wt 183 lb 12.8 oz (83.4 kg)   SpO2 96%   BMI 27.95 kg/m  The patient is alert and oriented x 3.  The mood and affect are normal.  The skin is warm and dry.  Color is normal.  The HEENT exam reveals that the sclera are nonicteric.  The mucous membranes are moist.  The carotids are 2+ without bruits.  There is no thyromegaly.  There is no JVD.  The lungs are clear.  The chest wall is non tender.  The heart exam reveals a regular rate with a normal S1 and S2.  There are no murmurs, gallops, or rubs.  The PMI is not displaced.   Abdominal exam reveals good bowel sounds.  There is no guarding or rebound.  There is no hepatosplenomegaly or tenderness.  There are no masses.  Exam of the legs reveal no clubbing, cyanosis, or edema.  The  legs are without rashes.  The distal pulses are intact.  Cranial nerves II - XII are intact.  Motor and sensory functions are intact.  The gait is normal.  ECG: Oct. 21, 2017:   NSR at 71.   TWI in the inferior and lateral leads.    Assessment / Plan:   1. Hypertension- his blood pressure is a little bit elevated today. He's been on vacation and has probably been eating a bit more salt than he normally does. I've recommended that he keep a blood pressure log. He states that his blood pressure is typically in the normal range. We'll continue with his current medications. His labs are stable.  2. LVH  3. Hyperlipidemia- his lipids are well controlled. Continue atorvastatin. His labs were drawn at his primary medical doctor's office. Continue current medications.  I'll see him again in one year.    Mertie Moores, MD  01/28/2016 3:25 PM    [address],  [place], [place]  [address] Pager [phone] Phone: [phone]; Fax: [phone]   [facility]  [address] [place],   [address] [phone]   Fax [phone]

## 2016-02-10 ENCOUNTER — Other Ambulatory Visit: Payer: Self-pay | Admitting: Family Medicine

## 2016-02-10 NOTE — Telephone Encounter (Signed)
Is this okay to refill? 

## 2016-02-10 NOTE — Telephone Encounter (Signed)
Called in xanax 

## 2016-02-28 ENCOUNTER — Other Ambulatory Visit: Payer: Self-pay | Admitting: *Deleted

## 2016-02-28 MED ORDER — PREDNISONE 10 MG (21) PO TBPK
ORAL_TABLET | ORAL | 0 refills | Status: DC
Start: 2016-02-28 — End: 2016-05-24

## 2016-03-03 ENCOUNTER — Encounter: Payer: Self-pay | Admitting: Sports Medicine

## 2016-03-10 ENCOUNTER — Other Ambulatory Visit: Payer: Self-pay | Admitting: *Deleted

## 2016-03-10 DIAGNOSIS — M48061 Spinal stenosis, lumbar region without neurogenic claudication: Secondary | ICD-10-CM

## 2016-03-11 ENCOUNTER — Other Ambulatory Visit: Payer: Self-pay | Admitting: Sports Medicine

## 2016-03-11 DIAGNOSIS — M48061 Spinal stenosis, lumbar region without neurogenic claudication: Secondary | ICD-10-CM

## 2016-03-18 ENCOUNTER — Other Ambulatory Visit: Payer: Self-pay | Admitting: Sports Medicine

## 2016-03-18 ENCOUNTER — Ambulatory Visit
Admission: RE | Admit: 2016-03-18 | Discharge: 2016-03-18 | Disposition: A | Discharge status: Home / Self Care | Payer: BC Managed Care – PPO | Source: Ambulatory Visit | Attending: Sports Medicine | Admitting: Sports Medicine

## 2016-03-18 DIAGNOSIS — M48061 Spinal stenosis, lumbar region without neurogenic claudication: Secondary | ICD-10-CM

## 2016-03-18 MED ORDER — IOPAMIDOL (ISOVUE-M 200) INJECTION 41%
1.0000 mL | Freq: Once | INTRAMUSCULAR | Status: AC
  Administered 2016-03-18: 1 mL via EPIDURAL

## 2016-03-18 MED ORDER — METHYLPREDNISOLONE ACETATE 40 MG/ML INJ SUSP (RADIOLOG
120.0000 mg | Freq: Once | INTRAMUSCULAR | Status: AC
  Administered 2016-03-18: 120 mg via EPIDURAL

## 2016-03-18 NOTE — Discharge Instructions (Signed)

## 2016-03-24 ENCOUNTER — Encounter: Payer: Self-pay | Admitting: Sports Medicine

## 2016-05-24 ENCOUNTER — Encounter: Payer: Self-pay | Admitting: Sports Medicine

## 2016-05-24 ENCOUNTER — Other Ambulatory Visit: Payer: Self-pay | Admitting: Sports Medicine

## 2016-05-25 ENCOUNTER — Encounter: Payer: Self-pay | Admitting: *Deleted

## 2016-05-25 MED ORDER — PREDNISONE 10 MG (21) PO TBPK: ORAL_TABLET | ORAL | 0 refills | Status: DC

## 2016-06-29 ENCOUNTER — Other Ambulatory Visit: Payer: Self-pay | Admitting: Family Medicine

## 2016-06-29 NOTE — Telephone Encounter (Signed)
ok 

## 2016-06-29 NOTE — Telephone Encounter (Signed)
Called in xanax per jcl 

## 2016-06-29 NOTE — Telephone Encounter (Signed)
Is this okay to refill? 

## 2016-07-28 ENCOUNTER — Other Ambulatory Visit: Payer: Self-pay | Admitting: Family Medicine

## 2016-07-28 DIAGNOSIS — E785 Hyperlipidemia, unspecified: Secondary | ICD-10-CM

## 2016-08-01 ENCOUNTER — Other Ambulatory Visit: Payer: Self-pay | Admitting: Family Medicine

## 2016-08-01 DIAGNOSIS — E785 Hyperlipidemia, unspecified: Secondary | ICD-10-CM

## 2016-10-22 ENCOUNTER — Other Ambulatory Visit: Payer: Self-pay | Admitting: Family Medicine

## 2016-10-23 ENCOUNTER — Telehealth: Payer: Self-pay | Admitting: Medical

## 2016-10-23 NOTE — Telephone Encounter (Signed)
Called spoke pt to set up an appt in order to get refill on his meds. Pt made an appt for 08/.27/18 to see dr. Redmond School . I told him that I will put the note back up to shane to see if he would go ahead an approve this since he made an appt to be seen . Pt didn't understand why a note had to be pt up to  shane to approve his meds. I told him because dr.lalonde was not in the office this week and I can't okay refill for xanax. That this have to come for doctor to okay.I tried to explain that I was letting shane know that he made an appt that way he possible a refill until [place] appt.  Pt was upset about this and told me that he had been seeing dr.lalonde since 1983 and that he will be call him on Monday about this and emailing him.

## 2016-10-23 NOTE — Telephone Encounter (Signed)
Can this pt have a refill ? 

## 2016-10-23 NOTE — Telephone Encounter (Signed)
Med refill on xanax came in.  Last visit > 1 year ago.  Needs appt

## 2016-10-27 ENCOUNTER — Other Ambulatory Visit: Payer: Self-pay

## 2016-10-27 ENCOUNTER — Encounter: Payer: Self-pay | Admitting: Family Medicine

## 2016-10-27 MED ORDER — ALPRAZOLAM 0.25 MG PO TABS: ORAL_TABLET | ORAL | 1 refills | Status: DC

## 2016-10-27 NOTE — Progress Notes (Signed)
Per [employer] verbal called to pharmacy by Juliann Pulse

## 2016-11-02 ENCOUNTER — Ambulatory Visit (INDEPENDENT_AMBULATORY_CARE_PROVIDER_SITE_OTHER): Payer: BC Managed Care – PPO | Admitting: Family Medicine

## 2016-11-02 ENCOUNTER — Encounter: Payer: Self-pay | Admitting: Family Medicine

## 2016-11-02 VITALS — BP 124/74 | HR 68 | Ht 68.0 in | Wt 175.0 lb

## 2016-11-02 DIAGNOSIS — J309 Allergic rhinitis, unspecified: Secondary | ICD-10-CM

## 2016-11-02 DIAGNOSIS — Z8249 Family history of ischemic heart disease and other diseases of the circulatory system: Secondary | ICD-10-CM

## 2016-11-02 DIAGNOSIS — E785 Hyperlipidemia, unspecified: Secondary | ICD-10-CM

## 2016-11-02 DIAGNOSIS — I10 Essential (primary) hypertension: Secondary | ICD-10-CM | POA: Diagnosis not present

## 2016-11-02 DIAGNOSIS — Z8546 Personal history of malignant neoplasm of prostate: Secondary | ICD-10-CM

## 2016-11-02 DIAGNOSIS — F40243 Fear of flying: Secondary | ICD-10-CM

## 2016-11-02 DIAGNOSIS — Z23 Encounter for immunization: Secondary | ICD-10-CM | POA: Diagnosis not present

## 2016-11-02 LAB — CBC WITH DIFFERENTIAL/PLATELET
BASOS PCT: 0 %
Basophils Absolute: 0 cells/uL (ref 0–200)
EOS PCT: 4 %
Eosinophils Absolute: 236 cells/uL (ref 15–500)
HEMATOCRIT: 47.7 % (ref 38.5–50.0)
HEMOGLOBIN: 16.1 g/dL (ref 13.2–17.1)
LYMPHS ABS: 1239 {cells}/uL (ref 850–3900)
LYMPHS PCT: 21 %
MCH: 30.3 pg (ref 27.0–33.0)
MCHC: 33.8 g/dL (ref 32.0–36.0)
MCV: 89.8 fL (ref 80.0–100.0)
MONO ABS: 590 {cells}/uL (ref 200–950)
MPV: 9.9 fL (ref 7.5–12.5)
Monocytes Relative: 10 %
NEUTROS PCT: 65 %
Neutro Abs: 3835 cells/uL (ref 1500–7800)
PLATELETS: 250 10*3/uL (ref 140–400)
RBC: 5.31 MIL/uL (ref 4.20–5.80)
RDW: 15.1 % — ABNORMAL HIGH (ref 11.0–15.0)
WBC: 5.9 10*3/uL (ref 4.0–10.5)

## 2016-11-02 MED ORDER — ATORVASTATIN CALCIUM 40 MG PO TABS: ORAL_TABLET | ORAL | 3 refills | Status: DC

## 2016-11-02 MED ORDER — LOSARTAN POTASSIUM 50 MG PO TABS: 50.0000 mg | ORAL_TABLET | Freq: Every day | ORAL | 3 refills | Status: DC

## 2016-11-02 NOTE — Progress Notes (Signed)
   Subjective:    Patient ID: Terry Hays, male    DOB: Mar 06, 1951, 66 y.o.   MRN: [id number]  HPI He is here for an interval evaluation. He does have a family history of heart disease as well as hyperlipidemia. He continues on atorvastatin and is having no difficulty with that. He is also had back pains and is presently having epidural injections that he finds quite useful. He is now playing basketball again. He is not having any more knee pain of any significance. His allergies seem to be under good control. Continues on losartan and HCTZ. He does have a fear of flying but does quite nicely on Xanax. He has a regimen of taking this prior to a flight as well as during. He has a previous history of prostate cancer with subsequent surgery. He is now 15 years out from this. Family and social history as well as health maintenance and immunizations were reviewed.   Review of Systems     Objective:   Physical Exam Alert and in no distress. Tympanic membranes and canals are normal. Pharyngeal area is normal. Neck is supple without adenopathy or thyromegaly. Cardiac exam shows a regular sinus rhythm without murmurs or gallops. Lungs are clear to auscultation. Abdominal exam shows no masses or tenderness.       Assessment & Plan:  Family history of heart disease in male family member before age 71 - Plan: CBC with Differential/Platelet, Comprehensive metabolic panel, Lipid panel  Dyslipidemia - Plan: Lipid panel, atorvastatin (LIPITOR) 40 MG tablet  Fear of flying  History of prostate cancer - Plan: PSA  Essential hypertension - Plan: losartan (COZAAR) 50 MG tablet  Need for influenza vaccination - Plan: Flu vaccine HIGH DOSE PF (Fluzone High dose)  Allergic rhinitis, unspecified seasonality, unspecified trigger He is doing quite well on his present medication regimen for the above diagnoses. He will continue on them.

## 2016-11-03 LAB — LIPID PANEL
Cholesterol: 184 mg/dL (ref <200)
HDL: 70 mg/dL (ref >40)
LDL CALC: 94 mg/dL (ref <100)
Total CHOL/HDL Ratio: 2.6 Ratio (ref <5.0)
Triglycerides: 101 mg/dL (ref <150)
VLDL: 20 mg/dL (ref <30)

## 2016-11-03 LAB — COMPREHENSIVE METABOLIC PANEL
ALBUMIN: 4.7 g/dL (ref 3.6–5.1)
ALT: 26 U/L (ref 9–46)
AST: 21 U/L (ref 10–35)
Alkaline Phosphatase: 67 U/L (ref 40–115)
BUN: 16 mg/dL (ref 7–25)
CO2: 22 mmol/L (ref 20–32)
Calcium: 9.8 mg/dL (ref 8.6–10.3)
Chloride: 101 mmol/L (ref 98–110)
Creat: 0.79 mg/dL (ref 0.70–1.25)
Glucose, Bld: 92 mg/dL (ref 65–99)
POTASSIUM: 4.1 mmol/L (ref 3.5–5.3)
Sodium: 139 mmol/L (ref 135–146)
TOTAL PROTEIN: 6.7 g/dL (ref 6.1–8.1)
Total Bilirubin: 1.1 mg/dL (ref 0.2–1.2)

## 2016-11-03 LAB — PSA

## 2016-11-19 ENCOUNTER — Other Ambulatory Visit: Payer: Self-pay | Admitting: Cardiovascular Disease

## 2016-11-19 NOTE — Telephone Encounter (Signed)
Please call office and schedule appointment for further refills. 

## 2017-02-05 ENCOUNTER — Other Ambulatory Visit: Payer: Self-pay | Admitting: Cardiovascular Disease

## 2017-02-05 MED ORDER — HYDROCHLOROTHIAZIDE 12.5 MG PO CAPS: 12.5000 mg | ORAL_CAPSULE | Freq: Every day | ORAL | 0 refills | Status: DC

## 2017-02-23 ENCOUNTER — Other Ambulatory Visit: Payer: Self-pay | Admitting: Cardiovascular Disease

## 2017-02-23 MED ORDER — HYDROCHLOROTHIAZIDE 12.5 MG PO CAPS: 12.5000 mg | ORAL_CAPSULE | Freq: Every day | ORAL | 0 refills | Status: DC

## 2017-02-23 NOTE — Telephone Encounter (Signed)
Pt's medication was sent to pt's pharmacy as requested. Confirmation received.  °

## 2017-04-07 ENCOUNTER — Other Ambulatory Visit: Payer: Self-pay | Admitting: Family Medicine

## 2017-04-07 NOTE — Telephone Encounter (Signed)
Is this okay to refill? 

## 2017-04-30 ENCOUNTER — Encounter: Payer: Self-pay | Admitting: *Deleted

## 2017-05-07 ENCOUNTER — Encounter: Payer: Self-pay | Admitting: Cardiovascular Disease

## 2017-05-07 ENCOUNTER — Ambulatory Visit: Payer: BC Managed Care – PPO | Admitting: Cardiovascular Disease

## 2017-05-07 VITALS — BP 136/72 | HR 75 | Ht 69.0 in | Wt 170.8 lb

## 2017-05-07 DIAGNOSIS — E782 Mixed hyperlipidemia: Secondary | ICD-10-CM | POA: Diagnosis not present

## 2017-05-07 DIAGNOSIS — I10 Essential (primary) hypertension: Secondary | ICD-10-CM

## 2017-05-07 MED ORDER — HYDROCHLOROTHIAZIDE 12.5 MG PO CAPS: 12.5000 mg | ORAL_CAPSULE | Freq: Every day | ORAL | 3 refills | Status: DC

## 2017-05-07 NOTE — Progress Notes (Signed)
Terry Hays Date of Birth  1950/06/01   [address]     [place], [place]  [address] [phone]  Fax  [phone]  Problem List 1. Hypertension 2. LVH 3. Hyperlipidemia 4.   Terry Hays is a middle-aged gentleman with a history of hypertension, left ventricular hypertrophy, Hypercholesterolemia and an abnormal EKG.  He's done very well since I last saw him one year ago. He still plays basketball on a regular basis. He's been saying little bit better hydrated and has noted that he is not having nearly as much dizziness. His energy levels have also been better.  June 10, 2012  Terry Hays is doing well.  He has had some orthopedic issues which has kept him out of the basketball court.  Jul 19, 2013:  Terry Hays is doing well.  He checks his blood pressure on regular basis at home and his readings are in the normal range. If it is a little high today in the office.  He remains very busy. He is doing a lot developing a [employer] . 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.  Oct. 5, 2016: Terry Hays is doing well. Still very active.  Plays basketball regularly . Still teaching at [employer].   No CP or dyspnea   Nov. 21, 2017:  Doing well from a cardiac standpoint.   Is injurred - bad back .   Has not been playing basketball  Does get some core exercises and stretches.  May 07, 2017:  Terry Hays is seen today for follow-up of his hypertension and hyperlipidemia. Has 2 new grandchildren ,  Spending lots of time with grandchildren .  Still nursing his back strain .   Lipids in Aug. Look good   Current Outpatient Medications on File Prior to Visit  Medication Sig Dispense Refill  . ALPRAZolam (XANAX) 0.25 MG tablet Take 0.25 mg by mouth at bedtime as needed for anxiety (FOR FLYING).    Marland Kitchen aspirin EC 81 MG tablet Take 1 tablet (81 mg total) by mouth daily. 90 tablet 3  . atorvastatin (LIPITOR) 40 MG tablet Take 40 mg by mouth daily.    . hydrochlorothiazide (MICROZIDE) 12.5 MG capsule  Take 1 capsule (12.5 mg total) by mouth daily. Please keep upcoming appt in March for future refills. Thank you 90 capsule 0  . magnesium gluconate (MAGONATE) 500 MG tablet Take 500 mg by mouth daily.    . mometasone (NASONEX) 50 MCG/ACT nasal spray Place 2 sprays into the nose as needed (ALLERGY).     Marland Kitchen losartan (COZAAR) 50 MG tablet Take 1 tablet (50 mg total) by mouth daily. 90 tablet 3   No current facility-administered medications on file prior to visit.     Allergies  Allergen Reactions  . Tramadol Other (See Comments)    "hallucinations"    Past Medical History:  Diagnosis Date  . Abnormal ECG   . Allergy   . Anxiety    FLYING  . Diverticulosis   . Dyslipidemia   . History of prostate cancer   . Hyperlipidemia   . LVH (left ventricular hypertrophy)     Past Surgical History:  Procedure Laterality Date  . COLONOSCOPY  2004 2011   MAGOD  . EYE SURGERY  2001   CATOACTS BOTH  . [place]  . PENILE PROSTHESIS IMPLANT  2011   DAHLSTEDT  . PROSTATE SURGERY  2003   PROSTRATECTOMY    Social History  Tobacco Use  Smoking Status Former Smoker  . Last attempt to quit: 03/09/1976  . Years since quitting: 41.1  Smokeless Tobacco Never Used    Social History   Substance and Sexual Activity  Alcohol Use Yes  . Alcohol/week: 0.6 oz  . Types: 1 Glasses of wine per week    Family History  Problem Relation Age of Onset  . Heart attack Unknown   . Hyperlipidemia Unknown   . Cancer Unknown     Reviw of Systems:  Reviewed in the HPI.  All other systems are negative.   Physical Exam: Blood pressure 136/72, pulse 75, height 5\' 9"  (1.753 m), weight 170 lb 12.8 oz (77.5 kg).  GEN:  Well nourished, well developed in no acute distress HEENT: Normal NECK: No JVD; No carotid bruits LYMPHATICS: No lymphadenopathy CARDIAC: RR , no murmurs, rubs, gallops RESPIRATORY:  Clear to auscultation without rales, wheezing or rhonchi  ABDOMEN: Soft,  non-tender, non-distended MUSCULOSKELETAL:  No edema; No deformity  SKIN: Warm and dry NEUROLOGIC:  Alert and oriented x 3  ECG: May 07, 2017: Normal sinus rhythm at 75.  He has nonspecific ST and T wave abnormalities.  No changes. From previous   Assessment / Plan:   1. Hypertension-   BP is well controlled.   Continue meds   2. LVH  3. Hyperlipidemia-  - lipids have been well controlled , labs drawn by Dr. Redmond School.   I'll see him again in one year.    Mertie Moores, MD  05/07/2017 11:11 AM    [place],  [place], [place]  [address] Pager [phone] Phone: [phone]; Fax: [phone]   [facility]  [address] [place], [place]  [address] [phone]   Fax [phone]

## 2017-05-07 NOTE — Patient Instructions (Signed)

## 2017-09-02 ENCOUNTER — Telehealth: Payer: Self-pay | Admitting: Family Medicine

## 2017-09-02 MED ORDER — ALPRAZOLAM 0.25 MG PO TABS: 0.2500 mg | ORAL_TABLET | Freq: Every evening | ORAL | 1 refills | Status: DC | PRN

## 2017-09-02 NOTE — Telephone Encounter (Signed)
Pt requesting refill on Alprazolam .25 mg. Pt need this refilled today if possible since he is leaving town tomorrow afternoon

## 2017-10-27 DIAGNOSIS — M5136 Other intervertebral disc degeneration, lumbar region: Secondary | ICD-10-CM | POA: Insufficient documentation

## 2017-11-13 ENCOUNTER — Other Ambulatory Visit: Payer: Self-pay | Admitting: Family Medicine

## 2017-11-13 DIAGNOSIS — I10 Essential (primary) hypertension: Secondary | ICD-10-CM

## 2017-11-13 DIAGNOSIS — E785 Hyperlipidemia, unspecified: Secondary | ICD-10-CM

## 2017-11-16 ENCOUNTER — Encounter: Payer: Self-pay | Admitting: Family Medicine

## 2017-11-29 ENCOUNTER — Encounter: Payer: Self-pay | Admitting: Family Medicine

## 2017-11-29 ENCOUNTER — Ambulatory Visit: Payer: Medicare Other | Admitting: Family Medicine

## 2017-11-29 VITALS — BP 122/76 | HR 57 | Temp 97.8°F | Wt 170.6 lb

## 2017-11-29 DIAGNOSIS — Z23 Encounter for immunization: Secondary | ICD-10-CM | POA: Diagnosis not present

## 2017-11-29 DIAGNOSIS — I517 Cardiomegaly: Secondary | ICD-10-CM

## 2017-11-29 DIAGNOSIS — Z8249 Family history of ischemic heart disease and other diseases of the circulatory system: Secondary | ICD-10-CM | POA: Diagnosis not present

## 2017-11-29 DIAGNOSIS — I10 Essential (primary) hypertension: Secondary | ICD-10-CM

## 2017-11-29 DIAGNOSIS — Z87891 Personal history of nicotine dependence: Secondary | ICD-10-CM

## 2017-11-29 DIAGNOSIS — K649 Unspecified hemorrhoids: Secondary | ICD-10-CM

## 2017-11-29 DIAGNOSIS — L570 Actinic keratosis: Secondary | ICD-10-CM

## 2017-11-29 DIAGNOSIS — E785 Hyperlipidemia, unspecified: Secondary | ICD-10-CM

## 2017-11-29 DIAGNOSIS — Z136 Encounter for screening for cardiovascular disorders: Secondary | ICD-10-CM

## 2017-11-29 LAB — CBC WITH DIFFERENTIAL/PLATELET
Basophils Absolute: 0 10*3/uL (ref 0.0–0.2)
Basos: 0 %
EOS (ABSOLUTE): 0.2 10*3/uL (ref 0.0–0.4)
EOS: 3 %
HEMATOCRIT: 48.9 % (ref 37.5–51.0)
Hemoglobin: 16.2 g/dL (ref 13.0–17.7)
IMMATURE GRANS (ABS): 0 10*3/uL (ref 0.0–0.1)
IMMATURE GRANULOCYTES: 0 %
LYMPHS: 13 %
Lymphocytes Absolute: 0.9 10*3/uL (ref 0.7–3.1)
MCH: 28.7 pg (ref 26.6–33.0)
MCHC: 33.1 g/dL (ref 31.5–35.7)
MCV: 87 fL (ref 79–97)
MONOS ABS: 0.8 10*3/uL (ref 0.1–0.9)
Monocytes: 12 %
NEUTROS PCT: 72 %
Neutrophils Absolute: 4.8 10*3/uL (ref 1.4–7.0)
PLATELETS: 270 10*3/uL (ref 150–450)
RBC: 5.65 x10E6/uL (ref 4.14–5.80)
RDW: 14.1 % (ref 12.3–15.4)
WBC: 6.6 10*3/uL (ref 3.4–10.8)

## 2017-11-29 LAB — COMPREHENSIVE METABOLIC PANEL
ALT: 23 IU/L (ref 0–44)
AST: 16 IU/L (ref 0–40)
Albumin/Globulin Ratio: 2.4 — ABNORMAL HIGH (ref 1.2–2.2)
Albumin: 4.5 g/dL (ref 3.6–4.8)
Alkaline Phosphatase: 65 IU/L (ref 39–117)
BUN/Creatinine Ratio: 12 (ref 10–24)
BUN: 10 mg/dL (ref 8–27)
Bilirubin Total: 0.9 mg/dL (ref 0.0–1.2)
CALCIUM: 9.6 mg/dL (ref 8.6–10.2)
CHLORIDE: 101 mmol/L (ref 96–106)
CO2: 26 mmol/L (ref 20–29)
CREATININE: 0.83 mg/dL (ref 0.76–1.27)
GFR, EST AFRICAN AMERICAN: 104 mL/min/{1.73_m2} (ref >59)
GFR, EST NON AFRICAN AMERICAN: 90 mL/min/{1.73_m2} (ref >59)
Globulin, Total: 1.9 g/dL (ref 1.5–4.5)
Glucose: 112 mg/dL — ABNORMAL HIGH (ref 65–99)
POTASSIUM: 3.9 mmol/L (ref 3.5–5.2)
Sodium: 141 mmol/L (ref 134–144)
TOTAL PROTEIN: 6.4 g/dL (ref 6.0–8.5)

## 2017-11-29 LAB — LIPID PANEL
CHOLESTEROL TOTAL: 176 mg/dL (ref 100–199)
Chol/HDL Ratio: 2.2 ratio (ref 0.0–5.0)
HDL: 80 mg/dL (ref >39)
LDL Calculated: 81 mg/dL (ref 0–99)
TRIGLYCERIDES: 74 mg/dL (ref 0–149)
VLDL Cholesterol Cal: 15 mg/dL (ref 5–40)

## 2017-11-29 NOTE — Progress Notes (Signed)
   Subjective:    Patient ID: Terry Hays, male    DOB: 03-29-1950, 67 y.o.   MRN: [id number]  HPI He is here for medication management visit.  He is now retired and very much enjoying this.  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  He does have a history of spinal stenosis and sees Dr. Nelva Bush for this.  Apparently an epidural is scheduled.  The first 1 worked for well over a year. He does have a family history of heart disease as well as hyperlipidemia.  He does see his cardiologist regularly.  Has a previous history of hemorrhoids but has had a colonoscopy.  He also has a previous history of smoking. He also has a question of lesions on his face.  Review of Systems     Objective:   Physical Exam Alert and in no distress. Tympanic membranes and canals are normal. Pharyngeal area is normal. Neck is supple without adenopathy or thyromegaly. Cardiac exam shows a regular sinus rhythm without murmurs or gallops. Lungs are clear to auscultation. Skin exam does show a scaly slightly erythematous lesion on the left temple.       Assessment & Plan:  Need for influenza vaccination - Plan: Flu vaccine HIGH DOSE PF (Fluzone High dose)  Dyslipidemia - Plan: Lipid panel  Family history of heart disease in male family member before age 56 - Plan: CBC with Differential/Platelet, Comprehensive metabolic panel, Lipid panel  Essential hypertension - Plan: CBC with Differential/Platelet, Comprehensive metabolic panel  Hemorrhoids, unspecified hemorrhoid type  LVH (left ventricular hypertrophy) - Plan: CBC with Differential/Platelet, Comprehensive metabolic panel, Lipid panel  Screening for AAA (abdominal aortic aneurysm) - Plan: US Aorta  Former smoker, stopped smoking in distant past  Actinic keratosis He will schedule for complete exam in several months.  He will continue on his present medications.  Recommend he follow-up with  dermatology concerning the actinic keratosis.

## 2017-12-02 ENCOUNTER — Other Ambulatory Visit: Payer: Self-pay | Admitting: Family Medicine

## 2017-12-02 DIAGNOSIS — Z136 Encounter for screening for cardiovascular disorders: Secondary | ICD-10-CM

## 2017-12-02 LAB — HGB A1C W/O EAG: HEMOGLOBIN A1C: 5.8 % — AB (ref 4.8–5.6)

## 2017-12-02 LAB — SPECIMEN STATUS REPORT

## 2017-12-09 ENCOUNTER — Ambulatory Visit
Admission: RE | Admit: 2017-12-09 | Discharge: 2017-12-09 | Disposition: A | Discharge status: Home / Self Care | Payer: BC Managed Care – PPO | Source: Ambulatory Visit | Attending: Family Medicine | Admitting: Family Medicine

## 2018-02-28 ENCOUNTER — Encounter: Payer: Self-pay | Admitting: Family Medicine

## 2018-02-28 ENCOUNTER — Telehealth: Payer: Self-pay | Admitting: Family Medicine

## 2018-02-28 MED ORDER — CIPROFLOXACIN HCL 500 MG PO TABS: 500.0000 mg | ORAL_TABLET | Freq: Two times a day (BID) | ORAL | 0 refills | Status: DC

## 2018-02-28 NOTE — Telephone Encounter (Signed)
UTI symptoms for last week. About to travel out of town

## 2018-03-01 MED ORDER — ALPRAZOLAM 0.25 MG PO TABS: 0.2500 mg | ORAL_TABLET | Freq: Every evening | ORAL | 1 refills | Status: DC | PRN

## 2018-03-17 ENCOUNTER — Encounter: Payer: Self-pay | Admitting: Family Medicine

## 2018-03-17 ENCOUNTER — Ambulatory Visit (INDEPENDENT_AMBULATORY_CARE_PROVIDER_SITE_OTHER): Payer: Medicare Other | Admitting: Family Medicine

## 2018-03-17 VITALS — BP 124/78 | HR 81 | Temp 98.1°F | Ht 67.75 in | Wt 173.2 lb

## 2018-03-17 DIAGNOSIS — M1990 Unspecified osteoarthritis, unspecified site: Secondary | ICD-10-CM

## 2018-03-17 DIAGNOSIS — K4090 Unilateral inguinal hernia, without obstruction or gangrene, not specified as recurrent: Secondary | ICD-10-CM

## 2018-03-17 DIAGNOSIS — J309 Allergic rhinitis, unspecified: Secondary | ICD-10-CM

## 2018-03-17 DIAGNOSIS — Z8249 Family history of ischemic heart disease and other diseases of the circulatory system: Secondary | ICD-10-CM | POA: Diagnosis not present

## 2018-03-17 DIAGNOSIS — E785 Hyperlipidemia, unspecified: Secondary | ICD-10-CM | POA: Diagnosis not present

## 2018-03-17 DIAGNOSIS — L570 Actinic keratosis: Secondary | ICD-10-CM

## 2018-03-17 DIAGNOSIS — M4807 Spinal stenosis, lumbosacral region: Secondary | ICD-10-CM

## 2018-03-17 DIAGNOSIS — I10 Essential (primary) hypertension: Secondary | ICD-10-CM | POA: Diagnosis not present

## 2018-03-17 DIAGNOSIS — F40243 Fear of flying: Secondary | ICD-10-CM

## 2018-03-17 DIAGNOSIS — I517 Cardiomegaly: Secondary | ICD-10-CM

## 2018-03-17 NOTE — Patient Instructions (Signed)
  Terry Hays , Thank you for taking time to come for your Medicare Wellness Visit. I appreciate your ongoing commitment to your health goals. Please review the following plan we discussed and let me know if I can assist you in the future.   These are the goals we discussed: Goals   None     This is a list of the screening recommended for you and due dates:  Health Maintenance  Topic Date Due  . Colon Cancer Screening  06/21/2019  . Tetanus Vaccine  12/22/2023  . Flu Shot  Completed  .  Hepatitis C: One time screening is recommended by Center for Disease Control  (CDC) for  adults born from [birth year] through 1967.   Completed  . Pneumonia vaccines  Discontinued

## 2018-03-17 NOTE — Progress Notes (Signed)
Terry Hays is a 68 y.o. male who presents for annual wellness visit and follow-up on chronic medical conditions.  He has a family history of heart disease and presently is on atorvastatin.  He thinks that that might be having an effect on his memory.  Presently he is willing to stay on it for the time being.  His allergies seem to be under good control.  He does occasionally use Xanax for flying.  Continues on losartan for his blood pressure.  Does have history of LVH and has seen cardiology in the past.  He has recent difficulty with UTIs and did find Cipro to be useful.  His last urinalysis was clear.  He does have some lesions on his face that he would like evaluated.  He also has a history of chronic back pain with spinal stenosis and has had epidural injections which has been quite helpful.  He is now exercising and doing core body strength. He is retired and enjoying taking care of his grandchildren.  Immunizations and Health Maintenance Immunization History  Administered Date(s) Administered  . Influenza, High Dose Seasonal PF 12/21/2013, 11/02/2016, 11/29/2017  . Influenza, Seasonal, Injecte, Preservative Fre 03/10/2012  . Influenza,inj,Quad PF,6+ Mos 12/23/2012  . Pneumococcal Conjugate-13 12/21/2013  . Pneumococcal Polysaccharide-23 10/29/1997  . Tdap 09/06/2001, 12/21/2013  . Zoster 03/09/2008   There are no preventive care reminders to display for this patient.  Last colonoscopy:06-20-09 Last PSA:11-02-16 Dentist:03-17-18  Ophtho: Spring 2019 Exercise: work out daily  Other doctors caring for patient include: Dr. Cathie Olden cardio, Dr. Nelva Bush ortho   Advanced Directives:    Depression screen:  See questionnaire below.     Depression screen [facility] 2/9 03/17/2018 11/02/2016 12/19/2015 11/25/2015 11/25/2015  Decreased Interest 0 0 0 0 0  Down, Depressed, Hopeless 0 0 0 0 0  PHQ - 2 Score 0 0 0 0 0    Fall Screen: See Questionaire below.   Fall Risk  03/17/2018 11/02/2016 12/19/2015  11/25/2015 11/25/2015  Falls in the past year? 0 No No No No  Risk for fall due to : - - - Other (Comment) Other (Comment)    ADL screen:  See questionnaire below.  Functional Status Survey: Is the patient deaf or have difficulty hearing?: No Does the patient have difficulty seeing, even when wearing glasses/contacts?: No Does the patient have difficulty concentrating, remembering, or making decisions?: No Does the patient have difficulty walking or climbing stairs?: No Does the patient have difficulty dressing or bathing?: No Does the patient have difficulty doing errands alone such as visiting a doctor's office or shopping?: No   Review of Systems  Constitutional: -, -unexpected weight change, -anorexia, -fatigue Allergy: -sneezing, -itching, -congestion Dermatology: denies changing moles, rash, lumps ENT: -runny nose, -ear pain, -sore throat,  Cardiology:  -chest pain, -palpitations, -orthopnea, Respiratory: -cough, -shortness of breath, -dyspnea on exertion, -wheezing,  Gastroenterology: -abdominal pain, -nausea, -vomiting, -diarrhea, -constipation, -dysphagia Hematology: -bleeding or bruising problems Musculoskeletal: -arthralgias, -myalgias, -joint swelling, -back pain, - Ophthalmology: -vision changes,  Urology: -dysuria, -difficulty urinating,  -urinary frequency, -urgency, incontinence Neurology: -, -numbness, , -memory loss, -falls, -dizziness    PHYSICAL EXAM:  BP 124/78 (BP Location: Left Arm, Patient Position: Sitting)   Pulse 81   Temp 98.1 F (36.7 C)   Ht 5' 7.75" (1.721 m)   Wt 173 lb 3.2 oz (78.6 kg)   SpO2 97%   BMI 26.53 kg/m   General Appearance: Alert, cooperative, no distress, appears stated age Head: Normocephalic, without  obvious abnormality, atraumatic Eyes: PERRL, conjunctiva/corneas clear, EOM's intact, fundi benign Ears: Normal TM's and external ear canals Nose: Nares normal, mucosa normal, no drainage or sinus   tenderness Throat: Lips,  mucosa, and tongue normal; teeth and gums normal Neck: Supple, no lymphadenopathy, thyroid:no enlargement/tenderness/nodules; no carotid bruit or JVD Lungs: Clear to auscultation bilaterally without wheezes, rales or ronchi; respirations unlabored Heart: Regular rate and rhythm, S1 and S2 normal, no murmur, rub or gallop Abdomen: Soft, non-tender, nondistended, normoactive bowel sounds, no masses, no hepatosplenomegaly Extremities: No clubbing, cyanosis or edema Pulses: 2+ and symmetric all extremities Skin: Skin color, texture, turgor normal, dry slightly erythematous lesion noted in the left temporal area. Lymph nodes: Cervical, supraclavicular, and axillary nodes normal Neurologic: CNII-XII intact, normal strength, sensation and gait; reflexes 2+ and symmetric throughout   Psych: Normal mood, affect, hygiene and grooming  ASSESSMENT/PLAN: Essential hypertension  Allergic rhinitis, unspecified seasonality, unspecified trigger  Dyslipidemia  Family history of heart disease in male family member before age 80  Fear of flying  LVH (left ventricular hypertrophy)  Non-recurrent unilateral inguinal hernia without obstruction or gangrene  Actinic keratosis  Spinal stenosis of lumbosacral region  Arthritis He will continue on his present medication regimen.  Referral to dermatology will be made.  Discussed the use of Cipro and his physical activities.  He does plan to get back involved in basketball.  We will continue with his physical therapy.  Discussed the Lipitor and we will readjust that whenever he is ready.  He is to see general surgery concerning the hernia and does have surgery scheduled.     recommended at least 30 minutes of aerobic activity at least 5 days/week;  Immunization recommendations discussed.  Colonoscopy recommendations reviewed.   Medicare Attestation I have personally reviewed: The patient's medical and social history Their use of alcohol, tobacco or  illicit drugs Their current medications and supplements The patient's functional ability including ADLs,fall risks, home safety risks, cognitive, and hearing and visual impairment Diet and physical activities Evidence for depression or mood disorders  The patient's weight, height, and BMI have been recorded in the chart.  I have made referrals, counseling, and provided education to the patient based on review of the above and I have provided the patient with a written personalized care plan for preventive services.     Jill Alexanders, MD   03/17/2018

## 2018-03-25 ENCOUNTER — Ambulatory Visit: Payer: Self-pay | Admitting: General Surgery

## 2018-03-25 NOTE — H&P (Signed)
Terry Hays ZOXWRUEA Documented: 03/25/2018 10:30 AM Location: [facility] Patient #: [id number] DOB: 02/21/1951 Married / Language: Terry Hays / Race: White Male  History of Present Illness Terry Hays M. Bertine Schlottman MD; 03/25/2018 11:00 AM) The patient is a 68 year old male who presents with an inguinal hernia. He is referred by Dr Zannie Cove for evaluation of a left inguinal hernia. He states that he noticed a bulge in his left groin last summer after having a urinary tract infection. It would come & go. However over the past month or 2 the bulge has been fairly constant but he is able to reduce it. He states that he may have some soreness in his groin at times. He denies any burning, shooting or stabbing pain. He did have some transient numbness in his left inner proximal left thigh but that has resolved. When he has a bowel movement he states that he can feel some gas in his left groin area. He has a history of prostate cancer and underwent prostatectomy as well as implant. He reports he had a hernia repair at age 20 but can't remember which side. He denies any tobacco use. He denies any chest pain or chest pressure, shortness of breath, dyspnea on exertion, TIAs or amaurosis fugax   Problem List/Past Medical Terry Hays M. Terry Pulling, MD; 03/25/2018 11:00 AM) LEFT INGUINAL HERNIA (K40.90)  Past Surgical History [name], CMA; 03/25/2018 10:30 AM) Cataract Surgery Right. Oral Surgery Prostate Surgery - Removal  Diagnostic Studies History [name], CMA; 03/25/2018 10:30 AM) Colonoscopy 5-10 years ago  Allergies [name], CMA; 03/25/2018 10:32 AM) traMADol HCl *ANALGESICS - OPIOID*  Medication History ([name], CMA; 03/25/2018 10:34 AM) ALPRAZolam (0.25MG  Tablet, Oral) Active. Atorvastatin Calcium (40MG  Tablet, Oral) Active. Benzonatate (200MG  Capsule, Oral) Active. hydroCHLOROthiazide (12.5MG  Capsule, Oral) Active. Losartan Potassium (100MG  Tablet, Oral)  Active. Baby Aspirin (81MG  Tablet Chewable, Oral) Active. Magnesium (500MG  Tablet, Oral) Active. Medications Reconciled  Social History [name], CMA; 03/25/2018 10:30 AM) Alcohol use Moderate alcohol use. Illicit drug use Uses socially only. Tobacco use Former smoker.  Family History [name], CMA; 03/25/2018 10:30 AM) Alcohol Abuse Brother, Father. Cancer Brother.  Other Problems Terry Hays M. Terry Pulling, MD; 03/25/2018 11:00 AM) Hypercholesterolemia Migraine Headache Prostate Cancer High blood pressure     Review of Systems Terry Hays M. Chala Gul MD; 03/25/2018 10:58 AM) General Not Present- Appetite Loss, Chills, Fatigue, Fever, Night Sweats, Weight Gain and Weight Loss. HEENT Present- Seasonal Allergies and Wears glasses/contact lenses. Not Present- Earache, Hearing Loss, Hoarseness, Nose Bleed, Oral Ulcers, Ringing in the Ears, Sinus Pain, Sore Throat, Visual Disturbances and Yellow Eyes. Cardiovascular Not Present- Chest Pain, Difficulty Breathing Lying Down, Leg Cramps, Palpitations, Rapid Heart Rate, Shortness of Breath and Swelling of Extremities. Gastrointestinal Not Present- Abdominal Pain, Bloating, Bloody Stool, Change in Bowel Habits, Chronic diarrhea, Constipation, Difficulty Swallowing, Excessive gas, Gets full quickly at meals, Hemorrhoids, Indigestion, Nausea, Rectal Pain and Vomiting. Male Genitourinary Present- Urgency and Urine Leakage. Not Present- Blood in Urine, Change in Urinary Stream, Frequency, Impotence, Nocturia and Painful Urination. Musculoskeletal Present- Back Pain and Joint Stiffness. Not Present- Joint Pain, Muscle Pain, Muscle Weakness and Swelling of Extremities. Neurological Not Present- Decreased Memory, Fainting, Headaches, Numbness, Seizures, Tingling, Tremor, Trouble walking and Weakness. Hematology Not Present- Blood Thinners, Easy Bruising, Excessive bleeding, Gland problems, HIV and Persistent Infections. All other systems  negative  Vitals ([name] CMA; 03/25/2018 10:31 AM) 03/25/2018 10:31 AM Weight: 178.2 lb Height: 67.75in Body Surface Area: 1.94 m Body Mass Index: 27.3  kg/m  Pulse: 76 (Regular)  BP: 130/78 (Sitting, Left Arm, Standard)      Physical Exam Terry Hays M. Haygen Zebrowski MD; 03/25/2018 10:58 AM)  General Mental Status-Alert. General Appearance-Consistent with stated age. Hydration-Well hydrated. Voice-Normal.  Head and Neck Head-normocephalic, atraumatic with no lesions or palpable masses. Trachea-midline. Thyroid Gland Characteristics - normal size and consistency.  Eye Eyeball - Bilateral-Extraocular movements intact. Sclera/Conjunctiva - Bilateral-No scleral icterus.  ENMT Ears -Note:nml ext ears.  Mouth and Throat -Note:lips intact.   Chest and Lung Exam Chest and lung exam reveals -quiet, even and easy respiratory effort with no use of accessory muscles and on auscultation, normal breath sounds, no adventitious sounds and normal vocal resonance. Inspection Chest Wall - Normal. Back - normal.  Breast - Did not examine.  Cardiovascular Cardiovascular examination reveals -normal heart sounds, regular rate and rhythm with no murmurs and normal pedal pulses bilaterally.  Abdomen Inspection Inspection of the abdomen reveals - No Hernias. Skin - Scar - no surgical scars. Palpation/Percussion Palpation and Percussion of the abdomen reveal - Soft, Non Tender, No Rebound tenderness, No Rigidity (guarding) and No hepatosplenomegaly. Auscultation Auscultation of the abdomen reveals - Bowel sounds normal.  Male Genitourinary Note: lower midline incision; obvious L groin bulge - reducible, NT, b/l testicles down; penile prosthesis, reservoir/pump Rt scrotum; no rt groin bulge  Peripheral Vascular Upper Extremity Palpation - Pulses bilaterally normal.  Neurologic Neurologic evaluation reveals -alert and oriented x 3 with no impairment  of recent or remote memory. Mental Status-Normal.  Neuropsychiatric The patient's mood and affect are described as -normal. Judgment and Insight-insight is appropriate concerning matters relevant to self.  Musculoskeletal Normal Exam - Left-Upper Extremity Strength Normal and Lower Extremity Strength Normal. Normal Exam - Right-Upper Extremity Strength Normal and Lower Extremity Strength Normal.  Lymphatic Head & Neck  General Head & Neck Lymphatics: Bilateral - Description - Normal. Axillary - Did not examine. Femoral & Inguinal - Did not examine.    Assessment & Plan Terry Hays M. Nastasha Reising MD; 03/25/2018 10:57 AM)  LEFT INGUINAL HERNIA (K40.90) Impression: We discussed the etiology of inguinal hernias. We discussed the signs & symptoms of incarceration & strangulation. We discussed non-operative and operative management. I don't think he is a candidate for laparoscopic approach given his prior open prostatectomy as well as his penile implant.  The patient has elected to proceed with OPEN REPAIR OF LEFT INGUINAL HERNIA WITH MESH   I described the procedure in detail. The patient was given educational material. We discussed the risks and benefits including but not limited to bleeding, infection, chronic inguinal pain, nerve entrapment, hernia recurrence, mesh complications, hematoma formation, urinary retention, injury to the testicle, numbness in the groin, blood clots, injury to the surrounding structures, and anesthesia risk. We also discussed the typical post operative recovery course, including no heavy lifting for 4-6 weeks. I explained that the likelihood of improvement of their symptoms is good.  I did tell him that he is having a slight increased risk for postoperative urinary retention given his surgical history  Current Plans Pt Education - Pamphlet Given - Hernia Surgery: discussed with patient and provided information.  HYPERTENSION, ESSENTIAL (I10)   HISTORY OF  PROSTATE CANCER (Z85.46)  Leighton Ruff. Terry Pulling, MD, FACS General, Bariatric, & Minimally Invasive Surgery [facility], [place]

## 2018-03-25 NOTE — H&P (View-Only) (Signed)
Dodge Ator JOACZYSA Documented: 03/25/2018 10:30 AM Location: [facility] Patient #: [id number] DOB: 06/20/1950 Married / Language: [name] / Race: White Male  History of Present Illness Terry Hiss M. Kyle Stansell MD; 03/25/2018 11:00 AM) The patient is a 68 year old male who presents with an inguinal hernia. He is referred by Dr Zannie Cove for evaluation of a left inguinal hernia. He states that he noticed a bulge in his left groin last summer after having a urinary tract infection. It would come & go. However over the past month or 2 the bulge has been fairly constant but he is able to reduce it. He states that he may have some soreness in his groin at times. He denies any burning, shooting or stabbing pain. He did have some transient numbness in his left inner proximal left thigh but that has resolved. When he has a bowel movement he states that he can feel some gas in his left groin area. He has a history of prostate cancer and underwent prostatectomy as well as implant. He reports he had a hernia repair at age 89 but can't remember which side. He denies any tobacco use. He denies any chest pain or chest pressure, shortness of breath, dyspnea on exertion, TIAs or amaurosis fugax   Problem List/Past Medical Terry Hiss M. Redmond Pulling, MD; 03/25/2018 11:00 AM) LEFT INGUINAL HERNIA (K40.90)  Past Surgical History [name], CMA; 03/25/2018 10:30 AM) Cataract Surgery Right. Oral Surgery Prostate Surgery - Removal  Diagnostic Studies History [name], CMA; 03/25/2018 10:30 AM) Colonoscopy 5-10 years ago  Allergies [name], CMA; 03/25/2018 10:32 AM) traMADol HCl *ANALGESICS - OPIOID*  Medication History ([name], CMA; 03/25/2018 10:34 AM) ALPRAZolam (0.25MG  Tablet, Oral) Active. Atorvastatin Calcium (40MG  Tablet, Oral) Active. Benzonatate (200MG  Capsule, Oral) Active. hydroCHLOROthiazide (12.5MG  Capsule, Oral) Active. Losartan Potassium (100MG  Tablet, Oral)  Active. Baby Aspirin (81MG  Tablet Chewable, Oral) Active. Magnesium (500MG  Tablet, Oral) Active. Medications Reconciled  Social History [name], CMA; 03/25/2018 10:30 AM) Alcohol use Moderate alcohol use. Illicit drug use Uses socially only. Tobacco use Former smoker.  Family History [name], CMA; 03/25/2018 10:30 AM) Alcohol Abuse Brother, Father. Cancer Brother.  Other Problems Terry Hiss M. Redmond Pulling, MD; 03/25/2018 11:00 AM) Hypercholesterolemia Migraine Headache Prostate Cancer High blood pressure     Review of Systems Terry Hiss M. Daelon Dunivan MD; 03/25/2018 10:58 AM) General Not Present- Appetite Loss, Chills, Fatigue, Fever, Night Sweats, Weight Gain and Weight Loss. HEENT Present- Seasonal Allergies and Wears glasses/contact lenses. Not Present- Earache, Hearing Loss, Hoarseness, Nose Bleed, Oral Ulcers, Ringing in the Ears, Sinus Pain, Sore Throat, Visual Disturbances and Yellow Eyes. Cardiovascular Not Present- Chest Pain, Difficulty Breathing Lying Down, Leg Cramps, Palpitations, Rapid Heart Rate, Shortness of Breath and Swelling of Extremities. Gastrointestinal Not Present- Abdominal Pain, Bloating, Bloody Stool, Change in Bowel Habits, Chronic diarrhea, Constipation, Difficulty Swallowing, Excessive gas, Gets full quickly at meals, Hemorrhoids, Indigestion, Nausea, Rectal Pain and Vomiting. Male Genitourinary Present- Urgency and Urine Leakage. Not Present- Blood in Urine, Change in Urinary Stream, Frequency, Impotence, Nocturia and Painful Urination. Musculoskeletal Present- Back Pain and Joint Stiffness. Not Present- Joint Pain, Muscle Pain, Muscle Weakness and Swelling of Extremities. Neurological Not Present- Decreased Memory, Fainting, Headaches, Numbness, Seizures, Tingling, Tremor, Trouble walking and Weakness. Hematology Not Present- Blood Thinners, Easy Bruising, Excessive bleeding, Gland problems, HIV and Persistent Infections. All other systems  negative  Vitals ([name] CMA; 03/25/2018 10:31 AM) 03/25/2018 10:31 AM Weight: 178.2 lb Height: 67.75in Body Surface Area: 1.94 m Body Mass Index: 27.3  kg/m  Pulse: 76 (Regular)  BP: 130/78 (Sitting, Left Arm, Standard)      Physical Exam Terry Hiss M. Jamison Soward MD; 03/25/2018 10:58 AM)  General Mental Status-Alert. General Appearance-Consistent with stated age. Hydration-Well hydrated. Voice-Normal.  Head and Neck Head-normocephalic, atraumatic with no lesions or palpable masses. Trachea-midline. Thyroid Gland Characteristics - normal size and consistency.  Eye Eyeball - Bilateral-Extraocular movements intact. Sclera/Conjunctiva - Bilateral-No scleral icterus.  ENMT Ears -Note:nml ext ears.  Mouth and Throat -Note:lips intact.   Chest and Lung Exam Chest and lung exam reveals -quiet, even and easy respiratory effort with no use of accessory muscles and on auscultation, normal breath sounds, no adventitious sounds and normal vocal resonance. Inspection Chest Wall - Normal. Back - normal.  Breast - Did not examine.  Cardiovascular Cardiovascular examination reveals -normal heart sounds, regular rate and rhythm with no murmurs and normal pedal pulses bilaterally.  Abdomen Inspection Inspection of the abdomen reveals - No Hernias. Skin - Scar - no surgical scars. Palpation/Percussion Palpation and Percussion of the abdomen reveal - Soft, Non Tender, No Rebound tenderness, No Rigidity (guarding) and No hepatosplenomegaly. Auscultation Auscultation of the abdomen reveals - Bowel sounds normal.  Male Genitourinary Note: lower midline incision; obvious L groin bulge - reducible, NT, b/l testicles down; penile prosthesis, reservoir/pump Rt scrotum; no rt groin bulge  Peripheral Vascular Upper Extremity Palpation - Pulses bilaterally normal.  Neurologic Neurologic evaluation reveals -alert and oriented x 3 with no impairment  of recent or remote memory. Mental Status-Normal.  Neuropsychiatric The patient's mood and affect are described as -normal. Judgment and Insight-insight is appropriate concerning matters relevant to self.  Musculoskeletal Normal Exam - Left-Upper Extremity Strength Normal and Lower Extremity Strength Normal. Normal Exam - Right-Upper Extremity Strength Normal and Lower Extremity Strength Normal.  Lymphatic Head & Neck  General Head & Neck Lymphatics: Bilateral - Description - Normal. Axillary - Did not examine. Femoral & Inguinal - Did not examine.    Assessment & Plan Terry Hiss M. Inola Lisle MD; 03/25/2018 10:57 AM)  LEFT INGUINAL HERNIA (K40.90) Impression: We discussed the etiology of inguinal hernias. We discussed the signs & symptoms of incarceration & strangulation. We discussed non-operative and operative management. I don't think he is a candidate for laparoscopic approach given his prior open prostatectomy as well as his penile implant.  The patient has elected to proceed with OPEN REPAIR OF LEFT INGUINAL HERNIA WITH MESH   I described the procedure in detail. The patient was given educational material. We discussed the risks and benefits including but not limited to bleeding, infection, chronic inguinal pain, nerve entrapment, hernia recurrence, mesh complications, hematoma formation, urinary retention, injury to the testicle, numbness in the groin, blood clots, injury to the surrounding structures, and anesthesia risk. We also discussed the typical post operative recovery course, including no heavy lifting for 4-6 weeks. I explained that the likelihood of improvement of their symptoms is good.  I did tell him that he is having a slight increased risk for postoperative urinary retention given his surgical history  Current Plans Pt Education - Pamphlet Given - Hernia Surgery: discussed with patient and provided information.  HYPERTENSION, ESSENTIAL (I10)   HISTORY OF  PROSTATE CANCER (Z85.46)  Leighton Ruff. Redmond Pulling, MD, FACS General, Bariatric, & Minimally Invasive Surgery [facility], [place]

## 2018-03-31 NOTE — Patient Instructions (Signed)
Wilson Dusenbery [id number]  03/31/2018   Your procedure is scheduled on: 04-07-2018    Report to [facility] Main  Entrance     Report to admitting at 9:00AM    Call this number if you have problems the morning of surgery [phone]      Remember: NO SOLID FOOD AFTER MIDNIGHT THE NIGHT PRIOR TO SURGERY. NOTHING BY MOUTH EXCEPT CLEAR LIQUIDS UNTIL 3 HOURS PRIOR TO [place] SURGERY. PLEASE FINISH ENSURE DRINK PER SURGEON ORDER 3 HOURS PRIOR TO SCHEDULED SURGERY TIME WHICH NEEDS TO BE COMPLETED AT _____8:00am_______. BRUSH YOUR TEETH MORNING OF SURGERY AND RINSE YOUR MOUTH OUT, NO CHEWING GUM CANDY OR MINTS.     CLEAR LIQUID DIET   Foods Allowed                                                                     Foods Excluded  Coffee and tea, regular and decaf                             liquids that you cannot  Plain Jell-O in any flavor                                             see through such as: Fruit ices (not with fruit pulp)                                     milk, soups, orange juice  Iced Popsicles                                    All solid food Carbonated beverages, regular and diet                                    Cranberry, grape and apple juices Sports drinks like Gatorade Lightly seasoned clear broth or consume(fat free) Sugar, honey syrup  Sample Menu Breakfast                                Lunch                                     Supper Cranberry juice                    Beef broth                            Chicken broth Jell-O  Grape juice                           Apple juice Coffee or tea                        Jell-O                                      Popsicle                                                Coffee or tea                        Coffee or tea  _____________________________________________________________________       Take these medicines the morning of surgery with A SIP OF  WATER: atorvastatin, nasal spray if needed                                You may not have any metal on your body including hair pins and              piercings  Do not wear jewelry, make-up, lotions, powders or perfumes, deodorant              Men may shave face and neck.   Do not bring valuables to the hospital. [place].  Contacts, dentures or bridgework may not be worn into surgery.      Patients discharged the day of surgery will not be allowed to drive home. IF YOU ARE HAVING SURGERY AND GOING HOME THE SAME DAY, YOU MUST HAVE AN ADULT TO DRIVE YOU HOME AND BE WITH YOU FOR 24 HOURS. YOU MAY GO HOME BY TAXI OR UBER OR ORTHERWISE, BUT AN ADULT MUST ACCOMPANY YOU HOME AND STAY WITH YOU FOR 24 HOURS.  Name and phone number of your driver:  Special Instructions: N/A              Please read over the following fact sheets you were given: _____________________________________________________________________             [facility] - Preparing for Surgery Before surgery, you can play an important role.  Because skin is not sterile, your skin needs to be as free of germs as possible.  You can reduce the number of germs on your skin by washing with CHG (chlorahexidine gluconate) soap before surgery.  CHG is an antiseptic cleaner which kills germs and bonds with the skin to continue killing germs even after washing. Please DO NOT use if you have an allergy to CHG or antibacterial soaps.  If your skin becomes reddened/irritated stop using the CHG and inform your nurse when you arrive at Short Stay. Do not shave (including legs and underarms) for at least 48 hours prior to the first CHG shower.  You may shave your face/neck. Please follow these instructions carefully:  1.  Shower with CHG Soap the night before surgery and the  morning of Surgery.  2.  If you  choose to wash your hair, wash your hair first as usual with your  normal  shampoo.  3.   After you shampoo, rinse your hair and body thoroughly to remove the  shampoo.                           4.  Use CHG as you would any other liquid soap.  You can apply chg directly  to the skin and wash                       Gently with a scrungie or clean washcloth.  5.  Apply the CHG Soap to your body ONLY FROM THE NECK DOWN.   Do not use on face/ open                           Wound or open sores. Avoid contact with eyes, ears mouth and genitals (private parts).                       Wash face,  Genitals (private parts) with your normal soap.             6.  Wash thoroughly, paying special attention to the area where your surgery  will be performed.  7.  Thoroughly rinse your body with warm water from the neck down.  8.  DO NOT shower/wash with your normal soap after using and rinsing off  the CHG Soap.                9.  Pat yourself dry with a clean towel.            10.  Wear clean pajamas.            11.  Place clean sheets on your bed the night of your first shower and do not  sleep with pets. Day of Surgery : Do not apply any lotions/deodorants the morning of surgery.  Please wear clean clothes to the hospital/surgery center.  FAILURE TO FOLLOW THESE INSTRUCTIONS MAY RESULT IN THE CANCELLATION OF YOUR SURGERY PATIENT SIGNATURE_________________________________  NURSE SIGNATURE__________________________________  ________________________________________________________________________

## 2018-03-31 NOTE — Progress Notes (Signed)
lov cards Dr Acie Fredrickson 05-07-17 epic  EKG 05-07-17 epic

## 2018-04-04 ENCOUNTER — Other Ambulatory Visit: Payer: Self-pay

## 2018-04-04 ENCOUNTER — Encounter (HOSPITAL_COMMUNITY): Payer: Self-pay

## 2018-04-04 ENCOUNTER — Encounter (HOSPITAL_COMMUNITY)
Admission: RE | Admit: 2018-04-04 | Discharge: 2018-04-04 | Disposition: A | Discharge status: Home / Self Care | Payer: Medicare Other | Source: Ambulatory Visit | Attending: General Surgery | Admitting: General Surgery

## 2018-04-04 DIAGNOSIS — Z79899 Other long term (current) drug therapy: Secondary | ICD-10-CM | POA: Diagnosis not present

## 2018-04-04 DIAGNOSIS — I10 Essential (primary) hypertension: Secondary | ICD-10-CM | POA: Diagnosis not present

## 2018-04-04 DIAGNOSIS — K4090 Unilateral inguinal hernia, without obstruction or gangrene, not specified as recurrent: Secondary | ICD-10-CM | POA: Diagnosis present

## 2018-04-04 DIAGNOSIS — Z01812 Encounter for preprocedural laboratory examination: Secondary | ICD-10-CM

## 2018-04-04 DIAGNOSIS — Z7982 Long term (current) use of aspirin: Secondary | ICD-10-CM | POA: Diagnosis not present

## 2018-04-04 DIAGNOSIS — F419 Anxiety disorder, unspecified: Secondary | ICD-10-CM | POA: Diagnosis not present

## 2018-04-04 DIAGNOSIS — Z87891 Personal history of nicotine dependence: Secondary | ICD-10-CM | POA: Diagnosis not present

## 2018-04-04 DIAGNOSIS — Z8546 Personal history of malignant neoplasm of prostate: Secondary | ICD-10-CM | POA: Diagnosis not present

## 2018-04-04 DIAGNOSIS — E7800 Pure hypercholesterolemia, unspecified: Secondary | ICD-10-CM | POA: Diagnosis not present

## 2018-04-04 HISTORY — DX: Prediabetes: R73.03

## 2018-04-04 HISTORY — DX: Essential (primary) hypertension: I10

## 2018-04-04 LAB — BASIC METABOLIC PANEL
Anion gap: 7 (ref 5–15)
BUN: 21 mg/dL (ref 8–23)
CO2: 27 mmol/L (ref 22–32)
Calcium: 9.7 mg/dL (ref 8.9–10.3)
Chloride: 105 mmol/L (ref 98–111)
Creatinine, Ser: 1 mg/dL (ref 0.61–1.24)
GFR calc Af Amer: >60 mL/min (ref >60)
GFR calc non Af Amer: >60 mL/min (ref >60)
GLUCOSE: 110 mg/dL — AB (ref 70–99)
Potassium: 4.1 mmol/L (ref 3.5–5.1)
Sodium: 139 mmol/L (ref 135–145)

## 2018-04-04 LAB — CBC
HCT: 50.8 % (ref 39.0–52.0)
Hemoglobin: 16.3 g/dL (ref 13.0–17.0)
MCH: 29.2 pg (ref 26.0–34.0)
MCHC: 32.1 g/dL (ref 30.0–36.0)
MCV: 90.9 fL (ref 80.0–100.0)
Platelets: 211 10*3/uL (ref 150–400)
RBC: 5.59 MIL/uL (ref 4.22–5.81)
RDW: 14.1 % (ref 11.5–15.5)
WBC: 6.3 10*3/uL (ref 4.0–10.5)
nRBC: 0 % (ref 0.0–0.2)

## 2018-04-04 LAB — HEMOGLOBIN A1C
Hgb A1c MFr Bld: 5.9 % — ABNORMAL HIGH (ref 4.8–5.6)
Mean Plasma Glucose: 122.63 mg/dL

## 2018-04-06 NOTE — Anesthesia Preprocedure Evaluation (Addendum)
Anesthesia Evaluation  Patient identified by MRN, date of birth, ID band Patient awake    Reviewed: Allergy & Precautions, NPO status , Patient's Chart, lab work & pertinent test results  Airway Mallampati: II  TM Distance: >3 FB Neck ROM: Full    Dental no notable dental hx. (+) Implants, Dental Advisory Given, Teeth Intact   Pulmonary former smoker,    Pulmonary exam normal breath sounds clear to auscultation       Cardiovascular hypertension, Pt. on medications Normal cardiovascular exam Rhythm:Regular Rate:Normal  05/07/17 EKG SR R75 w nsst Changes   Neuro/Psych  Headaches, PSYCHIATRIC DISORDERS Anxiety    GI/Hepatic negative GI ROS, Neg liver ROS,   Endo/Other  negative endocrine ROS  Renal/GU negative Renal ROS     Musculoskeletal  (+) Arthritis ,   Abdominal   Peds  Hematology Hgb 16.3   Anesthesia Other Findings   Reproductive/Obstetrics                            Anesthesia Physical Anesthesia Plan  ASA: II  Anesthesia Plan: General   Post-op Pain Management:    Induction: Intravenous  PONV Risk Score and Plan: 3 and Treatment may vary due to age or medical condition, Ondansetron and Dexamethasone  Airway Management Planned: Oral ETT  Additional Equipment:   Intra-op Plan:   Post-operative Plan: Extubation in OR  Informed Consent: I have reviewed the patients History and Physical, chart, labs and discussed the procedure including the risks, benefits and alternatives for the proposed anesthesia with the patient or authorized representative who has indicated his/her understanding and acceptance.     Dental advisory given  Plan Discussed with: CRNA  Anesthesia Plan Comments: [facility] )       Anesthesia Quick Evaluation

## 2018-04-07 ENCOUNTER — Encounter (HOSPITAL_COMMUNITY)
Admission: RE | Disposition: A | Discharge status: Home / Self Care | Payer: Self-pay | Source: Home / Self Care | Attending: General Surgery

## 2018-04-07 ENCOUNTER — Ambulatory Visit (HOSPITAL_COMMUNITY)
Admission: RE | Admit: 2018-04-07 | Discharge: 2018-04-07 | Disposition: A | Discharge status: Home / Self Care | Payer: Medicare Other | Attending: General Surgery | Admitting: General Surgery

## 2018-04-07 ENCOUNTER — Ambulatory Visit (HOSPITAL_COMMUNITY): Payer: Medicare Other | Admitting: Anesthesiology

## 2018-04-07 ENCOUNTER — Ambulatory Visit (HOSPITAL_COMMUNITY): Payer: Medicare Other | Admitting: Physician Assistant

## 2018-04-07 ENCOUNTER — Encounter (HOSPITAL_COMMUNITY): Payer: Self-pay | Admitting: Emergency Medicine

## 2018-04-07 DIAGNOSIS — K4090 Unilateral inguinal hernia, without obstruction or gangrene, not specified as recurrent: Secondary | ICD-10-CM | POA: Insufficient documentation

## 2018-04-07 DIAGNOSIS — Z79899 Other long term (current) drug therapy: Secondary | ICD-10-CM | POA: Insufficient documentation

## 2018-04-07 DIAGNOSIS — E7800 Pure hypercholesterolemia, unspecified: Secondary | ICD-10-CM | POA: Diagnosis not present

## 2018-04-07 DIAGNOSIS — I10 Essential (primary) hypertension: Secondary | ICD-10-CM | POA: Diagnosis not present

## 2018-04-07 DIAGNOSIS — Z7982 Long term (current) use of aspirin: Secondary | ICD-10-CM | POA: Insufficient documentation

## 2018-04-07 DIAGNOSIS — Z87891 Personal history of nicotine dependence: Secondary | ICD-10-CM | POA: Insufficient documentation

## 2018-04-07 DIAGNOSIS — F419 Anxiety disorder, unspecified: Secondary | ICD-10-CM | POA: Diagnosis not present

## 2018-04-07 DIAGNOSIS — Z8546 Personal history of malignant neoplasm of prostate: Secondary | ICD-10-CM | POA: Insufficient documentation

## 2018-04-07 HISTORY — PX: INGUINAL HERNIA REPAIR: SHX194

## 2018-04-07 SURGERY — REPAIR, HERNIA, INGUINAL, ADULT
Anesthesia: General | Site: Groin | Laterality: Left

## 2018-04-07 MED ORDER — ROCURONIUM BROMIDE 50 MG/5ML IV SOSY
PREFILLED_SYRINGE | INTRAVENOUS | Status: DC | PRN
  Administered 2018-04-07: 60 mg via INTRAVENOUS
  Administered 2018-04-07: 10 mg via INTRAVENOUS
  Administered 2018-04-07: 5 mg via INTRAVENOUS

## 2018-04-07 MED ORDER — EPHEDRINE SULFATE 50 MG/ML IJ SOLN
INTRAMUSCULAR | Status: DC | PRN
  Administered 2018-04-07: 5 mg via INTRAVENOUS

## 2018-04-07 MED ORDER — SUCCINYLCHOLINE CHLORIDE 200 MG/10ML IV SOSY
PREFILLED_SYRINGE | INTRAVENOUS | Status: AC
  Filled 2018-04-07: qty 10

## 2018-04-07 MED ORDER — 0.9 % SODIUM CHLORIDE (POUR BTL) OPTIME
TOPICAL | Status: DC | PRN
  Administered 2018-04-07: 1000 mL

## 2018-04-07 MED ORDER — PROPOFOL 10 MG/ML IV BOLUS
INTRAVENOUS | Status: AC
  Filled 2018-04-07: qty 20

## 2018-04-07 MED ORDER — CHLORHEXIDINE GLUCONATE CLOTH 2 % EX PADS: 6.0000 | MEDICATED_PAD | Freq: Once | CUTANEOUS | Status: DC

## 2018-04-07 MED ORDER — CEFAZOLIN SODIUM-DEXTROSE 2-4 GM/100ML-% IV SOLN
2.0000 g | INTRAVENOUS | Status: AC
  Administered 2018-04-07: 2 g via INTRAVENOUS
  Filled 2018-04-07: qty 100

## 2018-04-07 MED ORDER — DEXAMETHASONE SODIUM PHOSPHATE 10 MG/ML IJ SOLN
INTRAMUSCULAR | Status: AC
  Filled 2018-04-07: qty 1

## 2018-04-07 MED ORDER — MIDAZOLAM HCL 5 MG/5ML IJ SOLN
INTRAMUSCULAR | Status: DC | PRN
  Administered 2018-04-07: 2 mg via INTRAVENOUS

## 2018-04-07 MED ORDER — KETOROLAC TROMETHAMINE 30 MG/ML IJ SOLN: 30.0000 mg | Freq: Once | INTRAMUSCULAR | Status: DC | PRN

## 2018-04-07 MED ORDER — ONDANSETRON HCL 4 MG/2ML IJ SOLN: 4.0000 mg | Freq: Once | INTRAMUSCULAR | Status: DC | PRN

## 2018-04-07 MED ORDER — MEPERIDINE HCL 50 MG/ML IJ SOLN: 6.2500 mg | INTRAMUSCULAR | Status: DC | PRN

## 2018-04-07 MED ORDER — MIDAZOLAM HCL 2 MG/2ML IJ SOLN
INTRAMUSCULAR | Status: AC
  Filled 2018-04-07: qty 2

## 2018-04-07 MED ORDER — MIDAZOLAM HCL 2 MG/2ML IJ SOLN: 1.0000 mg | INTRAMUSCULAR | Status: DC

## 2018-04-07 MED ORDER — LIDOCAINE HCL 2 % IJ SOLN
INTRAMUSCULAR | Status: AC
  Filled 2018-04-07: qty 20

## 2018-04-07 MED ORDER — ACETAMINOPHEN 500 MG PO TABS: 1000.0000 mg | ORAL_TABLET | Freq: Three times a day (TID) | ORAL | 2 refills | Status: AC

## 2018-04-07 MED ORDER — FENTANYL CITRATE (PF) 250 MCG/5ML IJ SOLN
INTRAMUSCULAR | Status: AC
  Filled 2018-04-07: qty 5

## 2018-04-07 MED ORDER — FENTANYL CITRATE (PF) 100 MCG/2ML IJ SOLN: 50.0000 ug | INTRAMUSCULAR | Status: DC

## 2018-04-07 MED ORDER — OXYCODONE HCL 5 MG PO TABS: 5.0000 mg | ORAL_TABLET | Freq: Four times a day (QID) | ORAL | 0 refills | Status: DC | PRN

## 2018-04-07 MED ORDER — BUPIVACAINE-EPINEPHRINE (PF) 0.25% -1:200000 IJ SOLN
INTRAMUSCULAR | Status: AC
  Filled 2018-04-07: qty 30

## 2018-04-07 MED ORDER — SUGAMMADEX SODIUM 200 MG/2ML IV SOLN
INTRAVENOUS | Status: AC
  Filled 2018-04-07: qty 2

## 2018-04-07 MED ORDER — FENTANYL CITRATE (PF) 250 MCG/5ML IJ SOLN
INTRAMUSCULAR | Status: DC | PRN
  Administered 2018-04-07: 100 ug via INTRAVENOUS

## 2018-04-07 MED ORDER — HYDROCODONE-ACETAMINOPHEN 7.5-325 MG PO TABS: 1.0000 | ORAL_TABLET | Freq: Once | ORAL | Status: DC | PRN

## 2018-04-07 MED ORDER — LACTATED RINGERS IV SOLN
INTRAVENOUS | Status: DC
  Administered 2018-04-07: 09:00:00 via INTRAVENOUS

## 2018-04-07 MED ORDER — PROPOFOL 10 MG/ML IV BOLUS
INTRAVENOUS | Status: DC | PRN
  Administered 2018-04-07: 140 mg via INTRAVENOUS

## 2018-04-07 MED ORDER — ACETAMINOPHEN 500 MG PO TABS
1000.0000 mg | ORAL_TABLET | ORAL | Status: AC
  Administered 2018-04-07: 1000 mg via ORAL
  Filled 2018-04-07: qty 2

## 2018-04-07 MED ORDER — DEXAMETHASONE SODIUM PHOSPHATE 10 MG/ML IJ SOLN
INTRAMUSCULAR | Status: DC | PRN
  Administered 2018-04-07: 10 mg via INTRAVENOUS

## 2018-04-07 MED ORDER — ROCURONIUM BROMIDE 100 MG/10ML IV SOLN
INTRAVENOUS | Status: AC
  Filled 2018-04-07: qty 1

## 2018-04-07 MED ORDER — FENTANYL CITRATE (PF) 100 MCG/2ML IJ SOLN: 25.0000 ug | INTRAMUSCULAR | Status: DC | PRN

## 2018-04-07 MED ORDER — ONDANSETRON HCL 4 MG/2ML IJ SOLN
INTRAMUSCULAR | Status: AC
  Filled 2018-04-07: qty 2

## 2018-04-07 MED ORDER — GABAPENTIN 300 MG PO CAPS
300.0000 mg | ORAL_CAPSULE | ORAL | Status: AC
  Administered 2018-04-07: 300 mg via ORAL
  Filled 2018-04-07: qty 1

## 2018-04-07 MED ORDER — SUGAMMADEX SODIUM 200 MG/2ML IV SOLN
INTRAVENOUS | Status: DC | PRN
  Administered 2018-04-07: 175 mg via INTRAVENOUS

## 2018-04-07 MED ORDER — EPHEDRINE 5 MG/ML INJ
INTRAVENOUS | Status: AC
  Filled 2018-04-07: qty 10

## 2018-04-07 MED ORDER — BUPIVACAINE-EPINEPHRINE 0.25% -1:200000 IJ SOLN
INTRAMUSCULAR | Status: DC | PRN
  Administered 2018-04-07: 30 mL

## 2018-04-07 MED ORDER — LIDOCAINE 2% (20 MG/ML) 5 ML SYRINGE
INTRAMUSCULAR | Status: AC
  Filled 2018-04-07: qty 5

## 2018-04-07 MED ORDER — LIDOCAINE 2% (20 MG/ML) 5 ML SYRINGE
INTRAMUSCULAR | Status: DC | PRN
  Administered 2018-04-07: 1.5 mg/kg/h via INTRAVENOUS

## 2018-04-07 MED ORDER — KETOROLAC TROMETHAMINE 15 MG/ML IJ SOLN
15.0000 mg | Freq: Once | INTRAMUSCULAR | Status: AC
  Administered 2018-04-07: 15 mg via INTRAVENOUS

## 2018-04-07 SURGICAL SUPPLY — 29 items
ADH SKN CLS APL DERMABOND .7 (GAUZE/BANDAGES/DRESSINGS) ×1
APL SKNCLS STERI-STRIP NONHPOA (GAUZE/BANDAGES/DRESSINGS)
BENZOIN TINCTURE PRP APPL 2/3 (GAUZE/BANDAGES/DRESSINGS) IMPLANT
CLOSURE WOUND 1/2 X4 (GAUZE/BANDAGES/DRESSINGS)
COVER SURGICAL LIGHT HANDLE (MISCELLANEOUS) ×3 IMPLANT
COVER WAND RF STERILE (DRAPES) IMPLANT
DECANTER SPIKE VIAL GLASS SM (MISCELLANEOUS) IMPLANT
DERMABOND ADVANCED (GAUZE/BANDAGES/DRESSINGS) ×2
DERMABOND ADVANCED .7 DNX12 (GAUZE/BANDAGES/DRESSINGS) IMPLANT
DRAIN PENROSE 18X1/2 LTX STRL (DRAIN) ×2 IMPLANT
DRAPE LAPAROTOMY T 98X78 PEDS (DRAPES) ×3 IMPLANT
DRSG TEGADERM 4X4.75 (GAUZE/BANDAGES/DRESSINGS) IMPLANT
ELECT REM PT RETURN 15FT ADLT (MISCELLANEOUS) ×3 IMPLANT
GLOVE BIO SURGEON STRL SZ7.5 (GLOVE) ×3 IMPLANT
GLOVE INDICATOR 8.0 STRL GRN (GLOVE) ×3 IMPLANT
GOWN STRL REUS W/TWL XL LVL3 (GOWN DISPOSABLE) ×6 IMPLANT
KIT BASIN OR (CUSTOM PROCEDURE TRAY) ×3 IMPLANT
MESH ULTRAPRO 3X6 7.6X15CM (Mesh General) ×2 IMPLANT
NEEDLE HYPO 22GX1.5 SAFETY (NEEDLE) ×3 IMPLANT
PACK GENERAL/GYN (CUSTOM PROCEDURE TRAY) ×3 IMPLANT
STRIP CLOSURE SKIN 1/2X4 (GAUZE/BANDAGES/DRESSINGS) IMPLANT
SUT MNCRL AB 4-0 PS2 18 (SUTURE) ×3 IMPLANT
SUT PROLENE 2 0 CT2 30 (SUTURE) ×8 IMPLANT
SUT VIC AB 2-0 SH 27 (SUTURE) ×3
SUT VIC AB 2-0 SH 27X BRD (SUTURE) ×1 IMPLANT
SUT VIC AB 3-0 SH 18 (SUTURE) ×3 IMPLANT
SYR 20CC LL (SYRINGE) ×3 IMPLANT
TOWEL OR 17X26 10 PK STRL BLUE (TOWEL DISPOSABLE) ×3 IMPLANT
TOWEL OR NON WOVEN STRL DISP B (DISPOSABLE) ×3 IMPLANT

## 2018-04-07 NOTE — Op Note (Signed)
Open Inguinal Hernia Repair with Mesh Procedure Note  Indications: The patient presented with a history of a left, reducible hernia.    Pre-operative Diagnosis: left reducible inguinal hernia  Post-operative Diagnosis: left indirect inguinal hernia  Surgeon: Greer Pickerel MD FACS  Assistants: Sherlean Foot RNFA  Anesthesia: General endotracheal anesthesia  Procedure Details  The patient was seen again in the Holding Room. The risks, benefits, complications, treatment options, and expected outcomes were discussed with the patient. The possibilities of reaction to medication, pulmonary aspiration, perforation of viscus, bleeding, recurrent infection, the need for additional procedures, and development of a complication requiring transfusion or further operation were discussed with the patient and/or family. The likelihood of success in repairing the hernia and returning the patient to their previous functional status is good.  There was concurrence with the proposed plan, and informed consent was obtained. The site of surgery was properly noted/marked. The patient was taken to the Operating Room, identified as Terry Hays, and the procedure verified as left inguinal hernia repair. A Time Out was held and the above information confirmed.  The patient was placed in the supine position and underwent induction of anesthesia. The lower abdomen and groin was prepped with Chloraprep and draped in the standard fashion, and 0.25% Marcaine with epinephrine was used to anesthetize the skin over the mid-portion of the inguinal canal. An oblique incision was made. Dissection was carried down through the subcutaneous tissue with cautery to the external oblique fascia.  We opened the external oblique fascia along the direction of its fibers to the external ring.  The spermatic cord was circumferentially dissected bluntly and retracted with a Penrose drain.  The floor of the inguinal canal was inspected & there  was no direct defect.  We skeletonized the spermatic cord and isolated a hernia sac. The hernia sac was reduced into the abdominal cavity.  We used a 3 x 6 inch piece of Ultrapro mesh, which was cut into a keyhole shape.  This was secured with 2-0 Prolene, beginning at the pubic tubercle, running this along the shelving edge inferiorly. Superiorly, the mesh was secured to the internal oblique fascia with interrupted 2-0 Prolene sutures.  The tails of the mesh were sutured together behind the spermatic cord.  The mesh was tucked underneath the external oblique fascia laterally. Local was infiltrated in a regional fashion.  The external oblique fascia was reapproximated with 2-0 Vicryl.  3-0 Vicryl was used to close the subcutaneous tissues and 4-0 Monocryl was used to close the skin in subcuticular fashion.  Dermabond was used to seal the incision.  The patient was then extubated and brought to the recovery room in stable condition.  All sponge, instrument, and needle counts were correct prior to closure and at the conclusion of the case.   Estimated Blood Loss: Minimal                 Complications: None; patient tolerated the procedure well.         Disposition: PACU - hemodynamically stable.         Condition: stable  Leighton Ruff. Redmond Pulling, MD, FACS General, Bariatric, & Minimally Invasive Surgery [facility], [place]

## 2018-04-07 NOTE — Anesthesia Procedure Notes (Signed)
Procedure Name: Intubation Date/Time: 04/07/2018 11:53 AM Performed by: Glory Buff, CRNA Pre-anesthesia Checklist: Patient identified, Emergency Drugs available, Suction available and Patient being monitored Patient Re-evaluated:Patient Re-evaluated prior to induction Oxygen Delivery Method: Circle system utilized Preoxygenation: Pre-oxygenation with 100% oxygen Induction Type: IV induction Ventilation: Mask ventilation without difficulty Laryngoscope Size: Glidescope and 3 Grade View: Grade I Tube type: Oral Tube size: 7.5 mm Number of attempts: 3 (DL x 1 with Sabra Heck and MAc 4 without view of vocal cords, glidescope 3 blade used with vocal cords seen.) Airway Equipment and Method: Stylet and Oral airway Placement Confirmation: ETT inserted through vocal cords under direct vision,  positive ETCO2 and breath sounds checked- equal and bilateral Secured at: 21 cm Tube secured with: Tape Dental Injury: Teeth and Oropharynx as per pre-operative assessment

## 2018-04-07 NOTE — Anesthesia Postprocedure Evaluation (Signed)
Anesthesia Post Note  Patient: Terry Hays  Procedure(s) Performed: Holli Humbles LEFT INGUINAL HERNIA REPAIR WITH MESH ERAS PATHWAY (Left Groin)     Patient location during evaluation: PACU Anesthesia Type: General Level of consciousness: awake and alert Pain management: pain level controlled Vital Signs Assessment: post-procedure vital signs reviewed and stable Respiratory status: spontaneous breathing, nonlabored ventilation, respiratory function stable and patient connected to nasal cannula oxygen Cardiovascular status: blood pressure returned to baseline and stable Postop Assessment: no apparent nausea or vomiting Anesthetic complications: no    Last Vitals:  Vitals:   04/07/18 1315 04/07/18 1330  BP: (!) 142/85 (!) 142/81  Pulse: 74 64  Resp: 15 15  Temp:  (!) 36.3 C  SpO2: 98% 95%    Last Pain:  Vitals:   04/07/18 1330  TempSrc:   PainSc: 0-No pain                 Barnet Glasgow

## 2018-04-07 NOTE — Interval H&P Note (Signed)
History and Physical Interval Note:  04/07/2018 11:26 AM  Terry Hays  has presented today for surgery, with the diagnosis of left inguinal hernia  The various methods of treatment have been discussed with the patient and family. After consideration of risks, benefits and other options for treatment, the patient has consented to  Procedure(s): [place] (Left) as a surgical intervention .  The patient's history has been reviewed, patient examined, no change in status, stable for surgery.  I have reviewed the patient's chart and labs.  Questions were answered to the patient's satisfaction.    Leighton Ruff. Redmond Pulling, MD, FACS General, Bariatric, & Minimally Invasive Surgery [facility] [place]  Greer Pickerel

## 2018-04-07 NOTE — Transfer of Care (Signed)
Immediate Anesthesia Transfer of Care Note  Patient: Terry Hays  Procedure(s) Performed: Holli Humbles LEFT INGUINAL HERNIA REPAIR WITH MESH ERAS PATHWAY (Left Groin)  Patient Location: PACU  Anesthesia Type:General  Level of Consciousness: awake, alert  and oriented  Airway & Oxygen Therapy: Patient Spontanous Breathing and Patient connected to face mask oxygen  Post-op Assessment: Report given to RN and Post -op Vital signs reviewed and stable  Post vital signs: Reviewed and stable  Last Vitals:  Vitals Value Taken Time  BP 138/80 04/07/2018  1:07 PM  Temp 36.6 C 04/07/2018  1:07 PM  Pulse 75 04/07/2018  1:09 PM  Resp 15 04/07/2018  1:09 PM  SpO2 98 % 04/07/2018  1:09 PM  Vitals shown include unvalidated device data.  Last Pain:  Vitals:   04/07/18 0925  TempSrc:   PainSc: 0-No pain      Patients Stated Pain Goal: 4 (88/41/66 0630)  Complications: No apparent anesthesia complications

## 2018-04-07 NOTE — Discharge Instructions (Signed)
[facility]  UMBILICAL OR INGUINAL HERNIA REPAIR: POST OP INSTRUCTIONS  Always review your discharge instruction sheet given to you by the facility where your surgery was performed. IF YOU HAVE DISABILITY OR FAMILY LEAVE FORMS, YOU MUST BRING THEM TO THE OFFICE FOR PROCESSING.   DO NOT GIVE THEM TO YOUR DOCTOR.  1. See pain control handout 2. Take your usually prescribed medications unless otherwise directed. 3. If you need a refill on your pain medication, please contact your pharmacy.  They will contact our office to request authorization. Prescriptions will not be filled after 5 pm or on week-ends. 4. You should follow a light diet the first 24 hours after arrival home, such as soup and crackers, etc.  Be sure to include lots of fluids daily.  Resume your normal diet the day after surgery. 5. Most patients will experience some swelling and bruising around the umbilicus or in the groin and scrotum.  Ice packs and reclining will help.  Swelling and bruising can take several days to resolve.  6. It is common to experience some constipation if taking pain medication after surgery.  Increasing fluid intake and taking a stool softener (such as Colace) will usually help or prevent this problem from occurring.  A mild laxative (Milk of Magnesia or Miralax) should be taken according to package directions if there are no bowel movements after 48 hours. 7.  If your surgeon used skin glue on the incision, you may shower in 24 hours.  The glue will flake off over the next 2-3 weeks.  Any sutures or staples will be removed at the office during your follow-up visit. 8. ACTIVITIES:  You may resume regular (light) daily activities beginning the next day--such as daily self-care, walking, climbing stairs--gradually increasing activities as tolerated.  You may have sexual intercourse when it is comfortable.  Refrain from any heavy lifting or straining until approved by your doctor. a. You may  drive when you are no longer taking prescription pain medication, you can comfortably wear a seatbelt, and you can safely maneuver your car and apply brakes. b. RETURN TO WORK:  9. You should see your doctor in the office for a follow-up appointment approximately 2-3 weeks after your surgery.  Make sure that you call for this appointment within a day or two after you arrive home to insure a convenient appointment time. 10. OTHER INSTRUCTIONS: DO NOT LIFT, PULL, PUSH ANYTHING GREATER THAN 10LBS X 4 WEEKS    WHEN TO CALL YOUR DOCTOR: 1. Fever over 101.0 2. Inability to urinate 3. Nausea and/or vomiting 4. Extreme swelling or bruising 5. Continued bleeding from incision. 6. Increased pain, redness, or drainage from the incision  The clinic staff is available to answer your questions during regular business hours.  Please dont hesitate to call and ask to speak to one of the nurses for clinical concerns.  If you have a medical emergency, go to the nearest emergency room or call 911.  A surgeon from [facility] is always on call at the hospital   [address] [place], [place], [place]  [address] ?  [address], [place], [place]   [address] [phone] ? [phone] ? FAX [phone] Web site: [email or web address]     Managing Your Pain After Surgery Without Opioids    Thank you for participating in our program to help patients manage their pain after surgery without opioids. This is part of our effort to provide you with the  best care possible, without exposing you or your family to the risk that opioids pose.  What pain can I expect after surgery? You can expect to have some pain after surgery. This is normal. The pain is typically worse the day after surgery, and quickly begins to get better. Many studies have found that many patients are able to manage their pain after surgery with Over-the-Counter (OTC) medications such as Tylenol and  Motrin. If you have a condition that does not allow you to take Tylenol or Motrin, notify your surgical team.  How will I manage my pain? The best strategy for controlling your pain after surgery is around the clock pain control with Tylenol (acetaminophen) and Motrin (ibuprofen or Advil). Alternating these medications with each other allows you to maximize your pain control. In addition to Tylenol and Motrin, you can use heating pads or ice packs on your incisions to help reduce your pain.  How will I alternate your regular strength over-the-counter pain medication? You will take a dose of pain medication every three hours. ; Start by taking 650 mg of Tylenol (2 pills of 325 mg) ; 3 hours later take 600 mg of Motrin (3 pills of 200 mg) ; 3 hours after taking the Motrin take 650 mg of Tylenol ; 3 hours after that take 600 mg of Motrin.   - 1 -  See example - if your first dose of Tylenol is at 12:00 PM   12:00 PM Tylenol 650 mg (2 pills of 325 mg)  3:00 PM Motrin 600 mg (3 pills of 200 mg)  6:00 PM Tylenol 650 mg (2 pills of 325 mg)  9:00 PM Motrin 600 mg (3 pills of 200 mg)  Continue alternating every 3 hours   We recommend that you follow this schedule around-the-clock for at least 3 days after surgery, or until you feel that it is no longer needed. Use the table on the last page of this handout to keep track of the medications you are taking. Important: Do not take more than 3000mg  of Tylenol or 3200mg  of Motrin in a 24-hour period. Do not take ibuprofen/Motrin if you have a history of bleeding stomach ulcers, severe kidney disease, &/or actively taking a blood thinner  What if I still have pain? If you have pain that is not controlled with the over-the-counter pain medications (Tylenol and Motrin or Advil) you might have what we call breakthrough pain. You will receive a prescription for a small amount of an opioid pain medication such as Oxycodone, Tramadol, or Tylenol with  Codeine. Use these opioid pills in the first 24 hours after surgery if you have breakthrough pain. Do not take more than 1 pill every 4-6 hours.  If you still have uncontrolled pain after using all opioid pills, don't hesitate to call our staff using the number provided. We will help make sure you are managing your pain in the best way possible, and if necessary, we can provide a prescription for additional pain medication.   Day 1    Time  Name of Medication Number of pills taken  Amount of Acetaminophen  Pain Level   Comments  AM PM       AM PM       AM PM       AM PM       AM PM       AM PM       AM PM       AM  PM       Total Daily amount of Acetaminophen Do not take more than  3,000 mg per day      Day 2    Time  Name of Medication Number of pills taken  Amount of Acetaminophen  Pain Level   Comments  AM PM       AM PM       AM PM       AM PM       AM PM       AM PM       AM PM       AM PM       Total Daily amount of Acetaminophen Do not take more than  3,000 mg per day      Day 3    Time  Name of Medication Number of pills taken  Amount of Acetaminophen  Pain Level   Comments  AM PM       AM PM       AM PM       AM PM          AM PM       AM PM       AM PM       AM PM       Total Daily amount of Acetaminophen Do not take more than  3,000 mg per day      Day 4    Time  Name of Medication Number of pills taken  Amount of Acetaminophen  Pain Level   Comments  AM PM       AM PM       AM PM       AM PM       AM PM       AM PM       AM PM       AM PM       Total Daily amount of Acetaminophen Do not take more than  3,000 mg per day      Day 5    Time  Name of Medication Number of pills taken  Amount of Acetaminophen  Pain Level   Comments  AM PM       AM PM       AM PM       AM PM       AM PM       AM PM       AM PM       AM PM       Total Daily amount of Acetaminophen Do not take more than  3,000 mg per day        Day 6    Time  Name of Medication Number of pills taken  Amount of Acetaminophen  Pain Level  Comments  AM PM       AM PM       AM PM       AM PM       AM PM       AM PM       AM PM       AM PM       Total Daily amount of Acetaminophen Do not take more than  3,000 mg per day      Day 7    Time  Name of Medication Number of pills taken  Amount of Acetaminophen  Pain Level   Comments  AM PM       AM PM       AM PM       AM PM       AM PM       AM PM       AM PM       AM PM       Total Daily amount of Acetaminophen Do not take more than  3,000 mg per day        For additional information about how and where to safely dispose of unused opioid medications - [employer]  Disclaimer: This document contains information and/or instructional materials adapted from [place] for the typical patient with your condition. It does not replace medical advice from your health care provider because your experience may differ from that of the typical patient. Talk to your health care provider if you have any questions about this document, your condition or your treatment plan. Adapted from [place]

## 2018-04-08 ENCOUNTER — Encounter (HOSPITAL_COMMUNITY): Payer: Self-pay | Admitting: General Surgery

## 2018-04-08 NOTE — Addendum Note (Signed)
Addendum  created 04/08/18 0656 by Lollie Sails, CRNA   Charge Capture section accepted

## 2018-04-08 NOTE — Addendum Note (Signed)
Addendum  created 04/08/18 0701 by Glory Buff, CRNA   Charge Capture section accepted

## 2018-04-08 NOTE — Addendum Note (Signed)
Addendum  created 04/08/18 0704 by Lollie Sails, CRNA   Charge Capture section accepted

## 2018-05-24 ENCOUNTER — Other Ambulatory Visit: Payer: Self-pay | Admitting: Cardiovascular Disease

## 2018-06-21 ENCOUNTER — Other Ambulatory Visit: Payer: Self-pay | Admitting: Cardiovascular Disease

## 2018-07-01 ENCOUNTER — Other Ambulatory Visit: Payer: Self-pay | Admitting: Cardiovascular Disease

## 2018-08-13 ENCOUNTER — Encounter: Payer: Self-pay | Admitting: Family Medicine

## 2018-08-22 ENCOUNTER — Other Ambulatory Visit: Payer: Self-pay

## 2018-08-22 MED ORDER — HYDROCHLOROTHIAZIDE 12.5 MG PO CAPS: 12.5000 mg | ORAL_CAPSULE | Freq: Every day | ORAL | 0 refills | Status: DC

## 2018-08-22 NOTE — Telephone Encounter (Signed)
Ok to give a 30 day supply.  Leave note on there about making an appt.

## 2018-09-01 DIAGNOSIS — I10 Essential (primary) hypertension: Secondary | ICD-10-CM

## 2018-09-01 MED ORDER — HYDROCHLOROTHIAZIDE 25 MG PO TABS: 25.0000 mg | ORAL_TABLET | Freq: Every day | ORAL | 3 refills | Status: DC

## 2018-09-01 MED ORDER — LOSARTAN POTASSIUM 100 MG PO TABS: 100.0000 mg | ORAL_TABLET | Freq: Every day | ORAL | 3 refills | Status: DC

## 2018-09-01 MED ORDER — POTASSIUM CHLORIDE ER 10 MEQ PO TBCR: 10.0000 meq | EXTENDED_RELEASE_TABLET | Freq: Every day | ORAL | 3 refills | Status: DC

## 2018-09-01 NOTE — Telephone Encounter (Signed)
Spoke with pt and went over recommendations per Dr. Acie Fredrickson.  Pt agreeable to plan.  Scheduled lab for 09/20/2018. Advised pt I will send message to Dr. Elmarie Shiley nurse to contact him about f/u appt.  Pt verbalized understanding and was appreciative for call.

## 2018-09-19 ENCOUNTER — Other Ambulatory Visit: Payer: Medicare Other

## 2018-09-19 ENCOUNTER — Ambulatory Visit: Payer: Medicare Other | Admitting: Cardiovascular Disease

## 2018-09-19 ENCOUNTER — Telehealth: Payer: Self-pay | Admitting: Cardiovascular Disease

## 2018-09-19 ENCOUNTER — Other Ambulatory Visit: Payer: Self-pay | Admitting: Interventional Cardiology

## 2018-09-19 NOTE — Telephone Encounter (Signed)

## 2018-09-20 ENCOUNTER — Encounter: Payer: Self-pay | Admitting: Cardiovascular Disease

## 2018-09-20 ENCOUNTER — Ambulatory Visit (INDEPENDENT_AMBULATORY_CARE_PROVIDER_SITE_OTHER): Payer: Medicare Other | Admitting: Cardiovascular Disease

## 2018-09-20 ENCOUNTER — Other Ambulatory Visit: Payer: Medicare Other

## 2018-09-20 ENCOUNTER — Other Ambulatory Visit: Payer: Self-pay

## 2018-09-20 VITALS — BP 116/70 | HR 84 | Ht 67.75 in | Wt 178.4 lb

## 2018-09-20 DIAGNOSIS — E785 Hyperlipidemia, unspecified: Secondary | ICD-10-CM | POA: Insufficient documentation

## 2018-09-20 DIAGNOSIS — I10 Essential (primary) hypertension: Secondary | ICD-10-CM | POA: Diagnosis not present

## 2018-09-20 DIAGNOSIS — E782 Mixed hyperlipidemia: Secondary | ICD-10-CM | POA: Diagnosis not present

## 2018-09-20 NOTE — Patient Instructions (Signed)
Medication Instructions:  Your physician recommends that you continue on your current medications as directed. Please refer to the Current Medication list given to you today.  If you need a refill on your cardiac medications before your next appointment, please call your pharmacy.   Lab work: TODAY - cholesterol, liver panel, basic metabolic panel  If you have labs (blood work) drawn today and your tests are completely normal, you will receive your results only by: . MyChart Message (if you have MyChart) OR . A paper copy in the mail If you have any lab test that is abnormal or we need to change your treatment, we will call you to review the results.  Testing/Procedures: None Ordered   Follow-Up: At [facility], you and your health needs are our priority.  As part of our continuing mission to provide you with exceptional heart care, we have created designated Provider Care Teams.  These Care Teams include your primary Cardiologist (physician) and Advanced Practice Providers (APPs -  Physician Assistants and Nurse Practitioners) who all work together to provide you with the care you need, when you need it. You will need a follow up appointment in:  1 years.  Please call our office 2 months in advance to schedule this appointment.  You may see Philip Nahser, MD or one of the following Advanced Practice Providers on your designated Care Team: Scott Weaver, PA-C Vin Bhagat, PA-C . Janine Hammond, NP     

## 2018-09-20 NOTE — Progress Notes (Signed)
Terry Hays Knee Date of Birth  1950/07/03   [address]     [place], [place]  [address] [phone]  Fax  [phone]  Problem List 1. Hypertension 2. LVH 3. Hyperlipidemia 4.   Terry Hays is a middle-aged gentleman with a history of hypertension, left ventricular hypertrophy, Hypercholesterolemia and an abnormal EKG.  He's done very well since I last saw him one year ago. He still plays basketball on a regular basis. He's been saying little bit better hydrated and has noted that he is not having nearly as much dizziness. His energy levels have also been better.  June 10, 2012  Terry Hays is doing well.  He has had some orthopedic issues which has kept him out of the basketball court.  Jul 19, 2013:  Terry Hays is doing well.  He checks his blood pressure on regular basis at home and his readings are in the normal range. If it is a little high today in the office.  He remains very busy. He is doing a lot developing a [employer] . 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.  Oct. 5, 2016: Terry Hays is doing well. Still very active.  Plays basketball regularly . Still teaching at [employer].   No CP or dyspnea   Nov. 21, 2017:  Doing well from a cardiac standpoint.   Is injurred - bad back .   Has not been playing basketball  Does get some core exercises and stretches.  May 07, 2017:  Terry Hays is seen today for follow-up of his hypertension and hyperlipidemia. Has 2 new grandchildren ,  Spending lots of time with grandchildren .  Still nursing his back strain .   Lipids in Aug. Look good   September 20, 2018:  Terry Hays is seen today for follow-up of his hypertension and hyperlipidemia. Has retired ,  Enjoying his grandchildren.   Misses his basketball and water aerobics  His BP has increased .  We doubled his Losartan and BP is now back to normal  He was eating too much salt ( he used to play competitive basket ball and would ordinarily sweat out the extra salt )   Current Outpatient  Medications on File Prior to Visit  Medication Sig Dispense Refill  . ALPRAZolam (XANAX) 0.25 MG tablet Take 1 tablet (0.25 mg total) by mouth at bedtime as needed for anxiety (FOR FLYING). 30 tablet 1  . aspirin EC 81 MG tablet Take 1 tablet (81 mg total) by mouth daily. 90 tablet 3  . atorvastatin (LIPITOR) 40 MG tablet TAKE 1 TABLET (40 MG TOTAL) BY MOUTH DAILY. (PATIENT MUST HAVE MED CHECK BEFORE ANYMORE REFILLS) (Patient taking differently: Take 40 mg by mouth daily. TAKE 1 TABLET (40 MG TOTAL) BY MOUTH DAILY. (PATIENT MUST HAVE MED CHECK BEFORE ANYMORE REFILLS)) 90 tablet 3  . hydrochlorothiazide (HYDRODIURIL) 25 MG tablet Take 1 tablet (25 mg total) by mouth daily. 90 tablet 3  . losartan (COZAAR) 100 MG tablet Take 1 tablet (100 mg total) by mouth daily. 90 tablet 3  . magnesium gluconate (MAGONATE) 500 MG tablet Take 500 mg by mouth daily.    . mometasone (NASONEX) 50 MCG/ACT nasal spray Place 2 sprays into the nose daily as needed (allergies).     Marland Kitchen oxyCODONE (OXY IR/ROXICODONE) 5 MG immediate release tablet Take 1 tablet (5 mg total) by mouth every 6 (six) hours as needed for severe pain. 15 tablet 0  . potassium chloride (K-DUR) 10 MEQ  tablet Take 1 tablet (10 mEq total) by mouth daily. 90 tablet 3  . Sodium Fluoride (FLUORIDEX) 1.1 % PSTE Place 1 application onto teeth 2 (two) times daily.    . TURMERIC PO Take 1,000 mg by mouth daily.     No current facility-administered medications on file prior to visit.     Allergies  Allergen Reactions  . Tramadol Other (See Comments)    "hallucinations"    Past Medical History:  Diagnosis Date  . Abnormal ECG   . Allergy   . Anxiety    FLYING  . Diverticulosis   . Dyslipidemia   . History of prostate cancer   . Hyperlipidemia   . Hypertension   . LVH (left ventricular hypertrophy)   . Pre-diabetes     Past Surgical History:  Procedure Laterality Date  . COLONOSCOPY  2004 2011   MAGOD  . EYE SURGERY  2001   CATOACTS BOTH   . [place]  . INGUINAL HERNIA REPAIR Left 04/07/2018   Procedure: OPERN LEFT INGUINAL HERNIA REPAIR WITH MESH ERAS PATHWAY;  Surgeon: Greer Pickerel, MD;  Location: [facility];  Service: General;  Laterality: Left;  . PENILE PROSTHESIS IMPLANT  2011   DAHLSTEDT  . PROSTATE SURGERY  2003   PROSTRATECTOMY    Social History   Tobacco Use  Smoking Status Former Smoker  . Quit date: 03/09/1976  . Years since quitting: 42.5  Smokeless Tobacco Never Used    Social History   Substance and Sexual Activity  Alcohol Use Yes  . Alcohol/week: 1.0 standard drinks  . Types: 1 Glasses of wine per week    Family History  Problem Relation Age of Onset  . Heart attack Other   . Hyperlipidemia Other   . Cancer Other     Reviw of Systems:  Reviewed in the HPI.  All other systems are negative.  Physical Exam: Blood pressure 116/70, pulse 84, height 5' 7.75" (1.721 m), weight 178 lb 6.4 oz (80.9 kg), SpO2 97 %.  GEN:  Elderly male,  NAD  HEENT: Normal NECK: No JVD; No carotid bruits LYMPHATICS: No lymphadenopathy CARDIAC: RRR  RESPIRATORY:  Clear to auscultation without rales, wheezing or rhonchi  ABDOMEN: Soft, non-tender, non-distended MUSCULOSKELETAL:  No edema; No deformity  SKIN: Warm and dry NEUROLOGIC:  Alert and oriented x 3  ECG:    Assessment / Plan:   1. Hypertension-   BP is better on the higher dose of Losartan and HCTZ . Marland Kitchen  Check labs today    3. Hyperlipidemia-  -on atorvastatin,   Check labs today   Will see him in a year     Mertie Moores, MD  09/20/2018 12:34 PM    [address],  [place], [place]  [address] Pager [phone] Phone: [phone]; Fax: [phone]

## 2018-09-21 LAB — BASIC METABOLIC PANEL
BUN/Creatinine Ratio: 18 (ref 10–24)
BUN: 18 mg/dL (ref 8–27)
CO2: 20 mmol/L (ref 20–29)
Calcium: 10.7 mg/dL — ABNORMAL HIGH (ref 8.6–10.2)
Chloride: 100 mmol/L (ref 96–106)
Creatinine, Ser: 0.98 mg/dL (ref 0.76–1.27)
GFR calc Af Amer: 90 mL/min/{1.73_m2} (ref >59)
GFR calc non Af Amer: 78 mL/min/{1.73_m2} (ref >59)
Glucose: 104 mg/dL — ABNORMAL HIGH (ref 65–99)
Potassium: 4.2 mmol/L (ref 3.5–5.2)
Sodium: 140 mmol/L (ref 134–144)

## 2018-09-21 LAB — LIPID PANEL
Chol/HDL Ratio: 2.6 ratio (ref 0.0–5.0)
Cholesterol, Total: 182 mg/dL (ref 100–199)
HDL: 71 mg/dL (ref >39)
LDL Calculated: 63 mg/dL (ref 0–99)
Triglycerides: 240 mg/dL — ABNORMAL HIGH (ref 0–149)
VLDL Cholesterol Cal: 48 mg/dL — ABNORMAL HIGH (ref 5–40)

## 2018-09-21 LAB — HEPATIC FUNCTION PANEL
ALT: 35 IU/L (ref 0–44)
AST: 32 IU/L (ref 0–40)
Albumin: 4.7 g/dL (ref 3.8–4.8)
Alkaline Phosphatase: 74 IU/L (ref 39–117)
Bilirubin Total: 0.6 mg/dL (ref 0.0–1.2)
Bilirubin, Direct: 0.2 mg/dL (ref 0.00–0.40)
Total Protein: 6.6 g/dL (ref 6.0–8.5)

## 2018-11-22 ENCOUNTER — Other Ambulatory Visit: Payer: Self-pay | Admitting: Family Medicine

## 2018-11-22 DIAGNOSIS — E785 Hyperlipidemia, unspecified: Secondary | ICD-10-CM

## 2018-12-02 ENCOUNTER — Other Ambulatory Visit: Payer: Self-pay

## 2018-12-02 ENCOUNTER — Other Ambulatory Visit (INDEPENDENT_AMBULATORY_CARE_PROVIDER_SITE_OTHER): Payer: Medicare Other

## 2018-12-02 DIAGNOSIS — Z23 Encounter for immunization: Secondary | ICD-10-CM

## 2019-01-14 IMAGING — US US ABDOMINAL AORTA SCREENING AAA
1 series · 14 of 19 positions shown · non-contrast
Comparison: Lumbar MRI 11/29/2015

CLINICAL DATA: 67-year-old male with history of smoking.

EXAM:
US ABDOMINAL AORTA MEDICARE SCREENING
TECHNIQUE: Ultrasound examination of the abdominal aorta was performed as a
screening evaluation for abdominal aortic aneurysm.

[Series 1: us abdominal aorta screening aaa · 0.28mm/px · 14 of 19 slices shown]
[im 1/19]
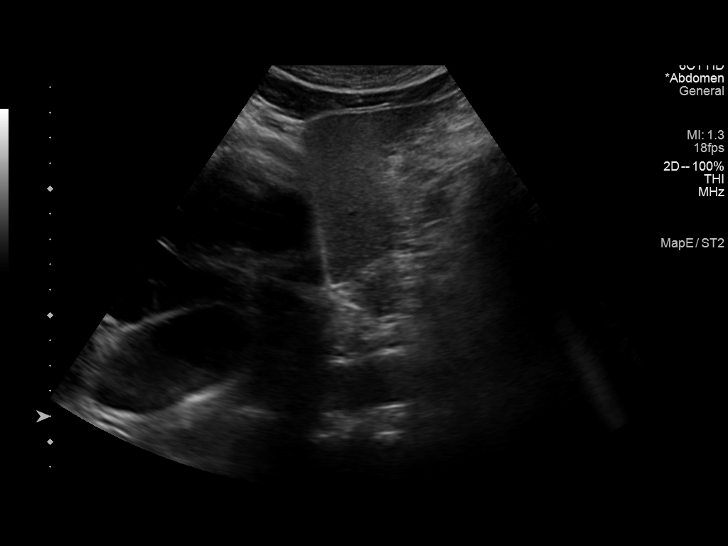
[im 3/19]
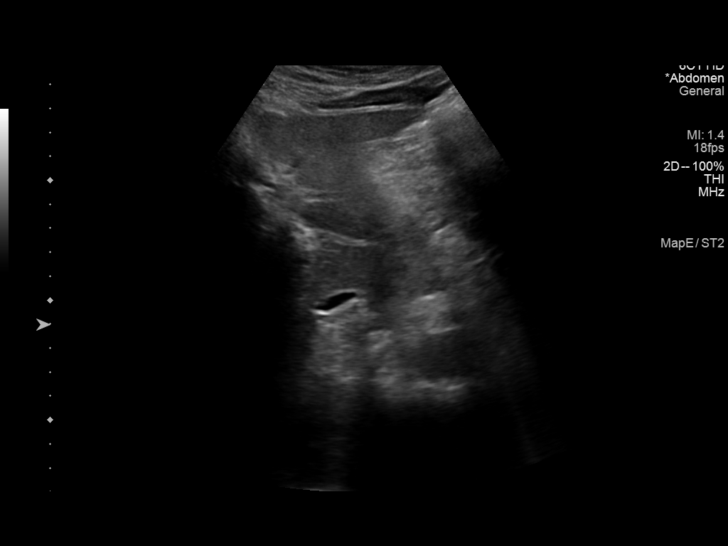
[im 4/19]
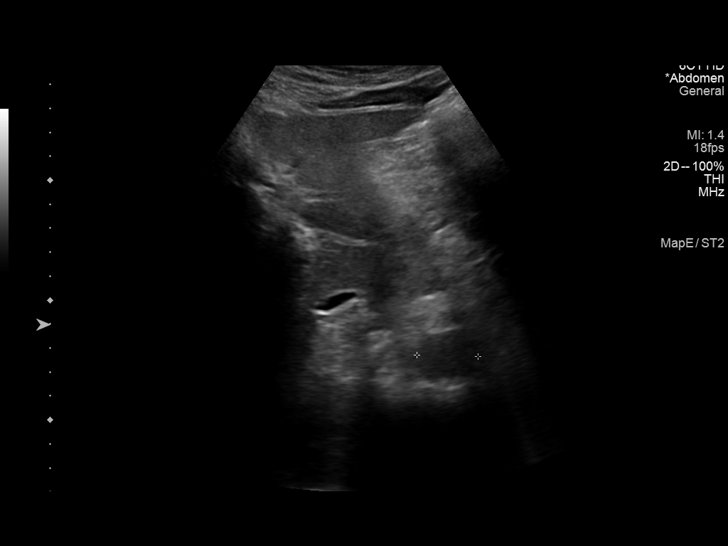
[im 5/19]
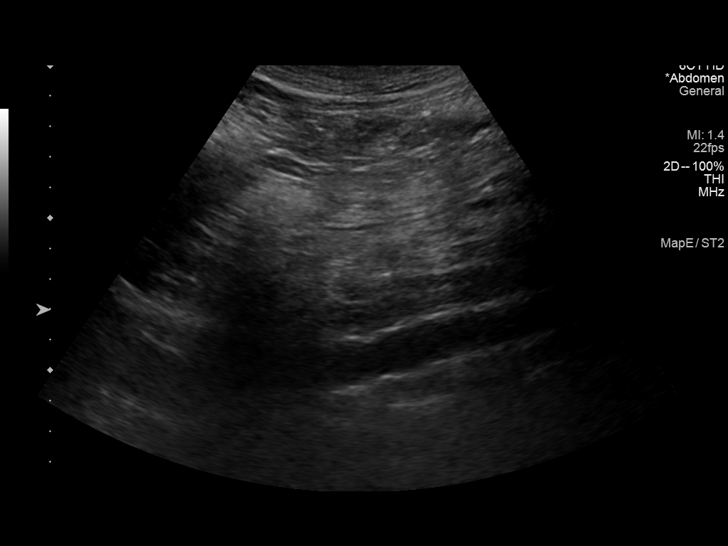
[im 7/19]
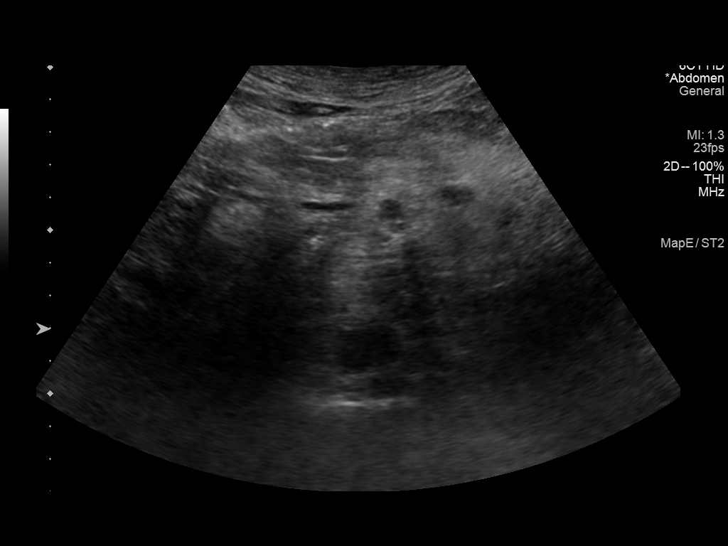
[im 8/19]
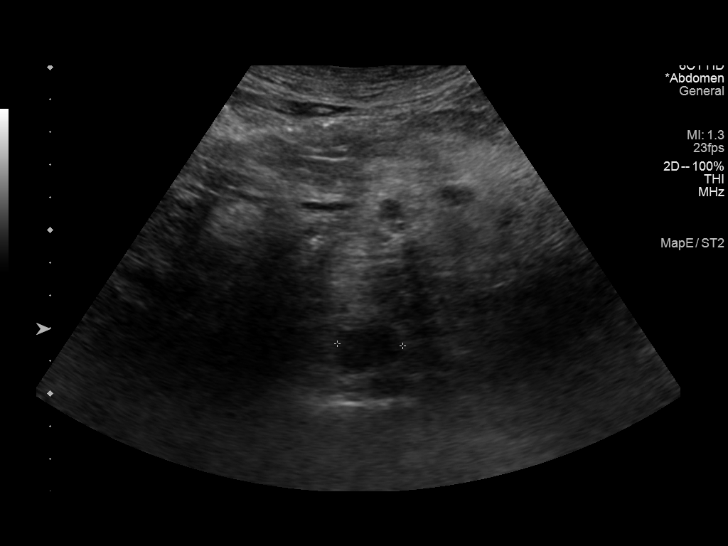
[im 9/19]
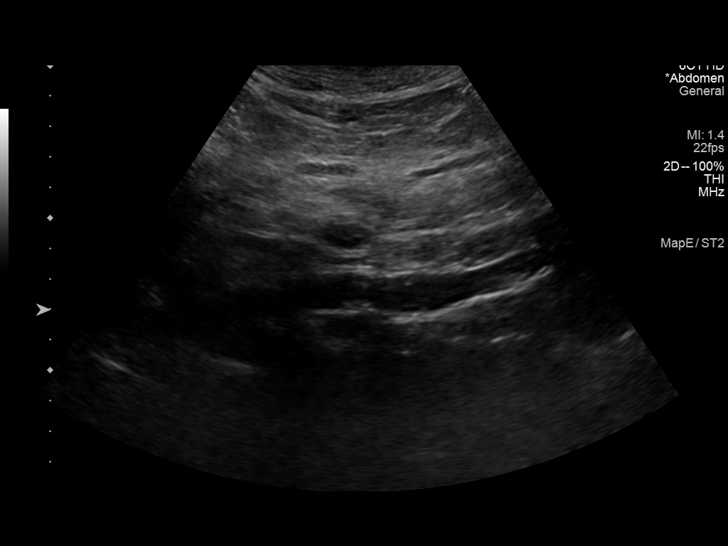
[im 11/19]
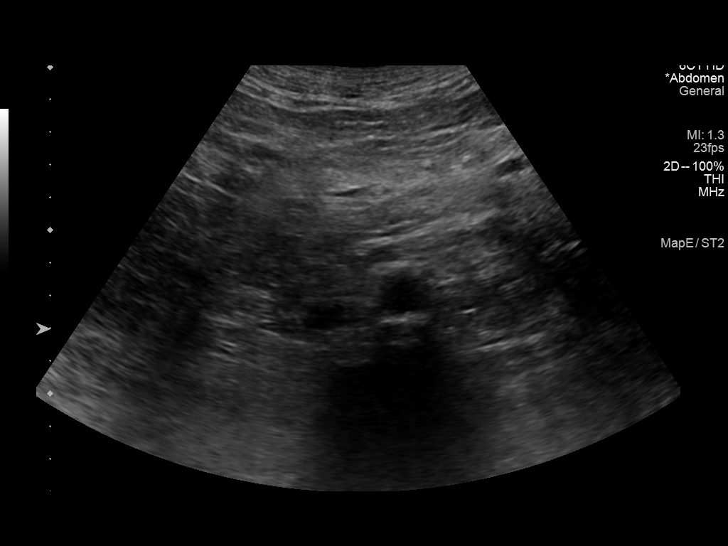
[im 12/19]
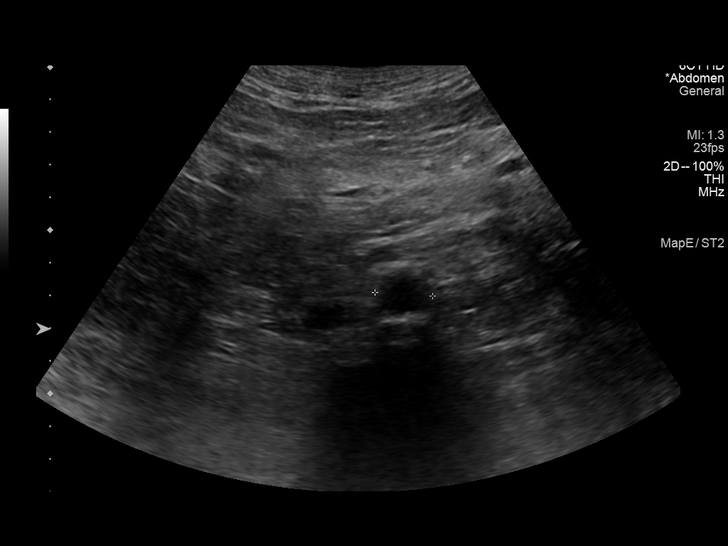
[im 13/19]
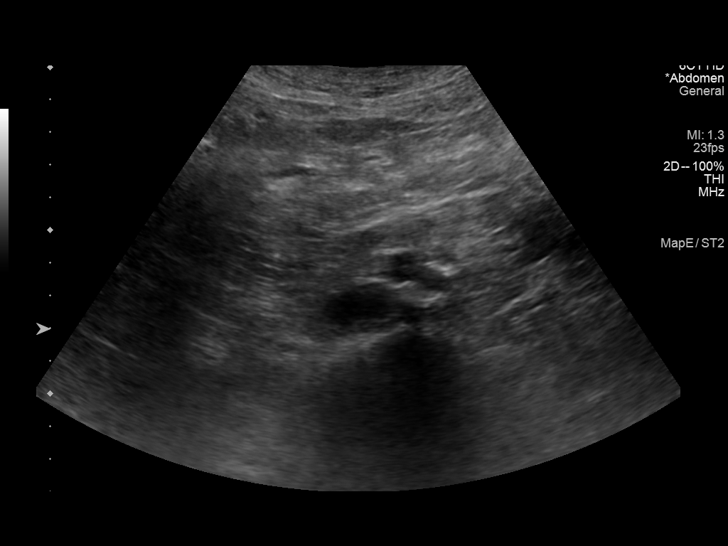
[im 15/19]
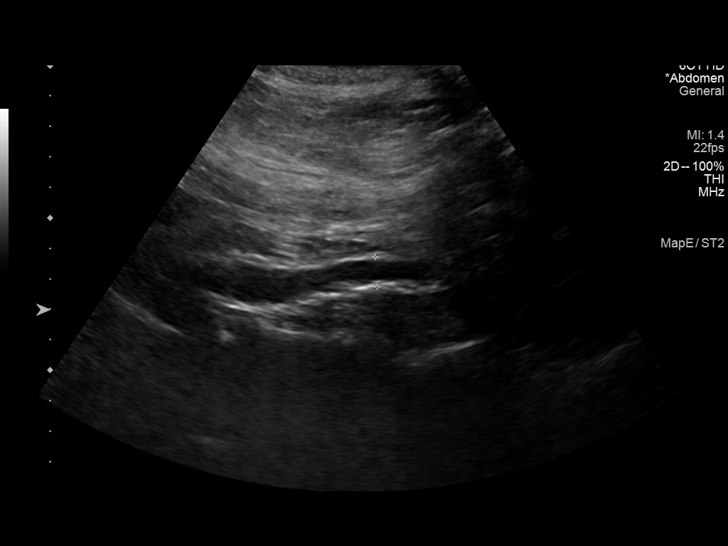
[im 16/19]
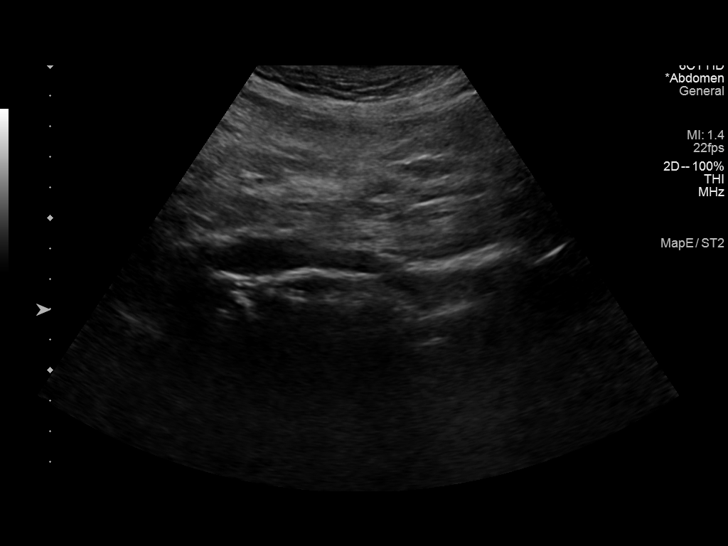
[im 17/19]
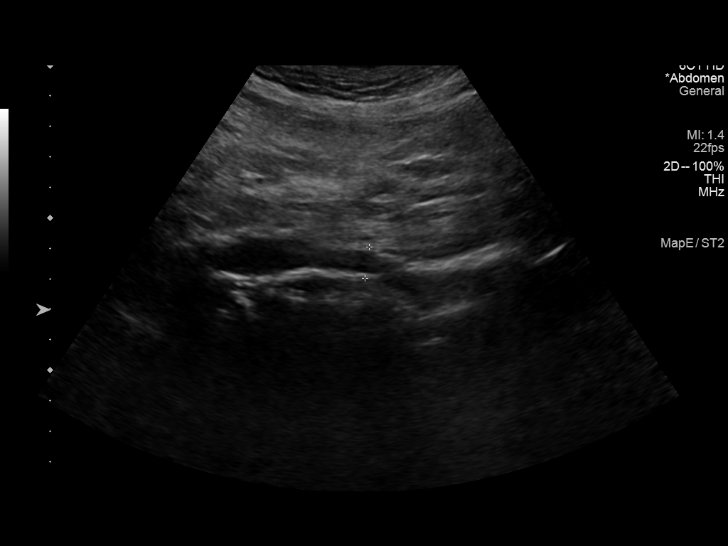
[im 19/19]
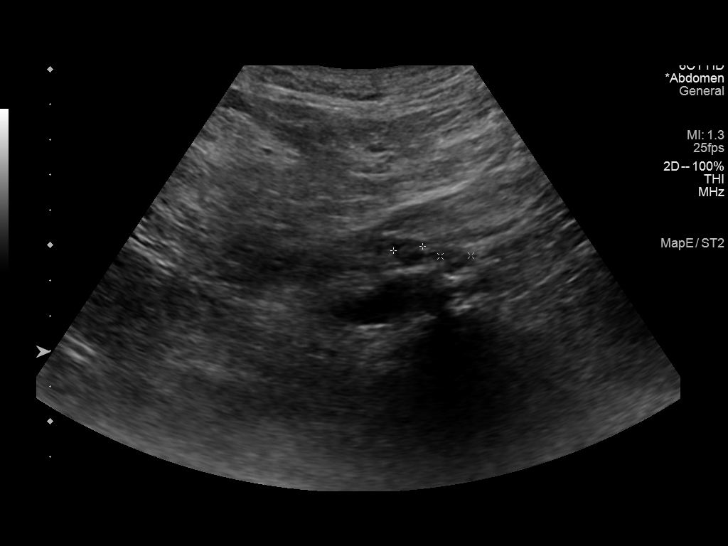

[14 of 19 positions shown; findings below may reference images not displayed]

FINDINGS: Abdominal aortic measurements as follows:

Proximal:  2.3 x 2.6 cm (AP by transverse)

Mid:         1.7 x 2.0 cm

Distal:      1.5 x 1.8 cm

The proximal iliac arteries appear within normal limits measuring
0.8-1 centimeter diameter.
IMPRESSION: Negative for abdominal aortic aneurysm.

## 2019-02-24 ENCOUNTER — Other Ambulatory Visit: Payer: Self-pay | Admitting: Family Medicine

## 2019-02-27 ENCOUNTER — Telehealth: Payer: Self-pay | Admitting: Family Medicine

## 2019-02-27 MED ORDER — ALPRAZOLAM 0.25 MG PO TABS: 0.2500 mg | ORAL_TABLET | Freq: Every evening | ORAL | 1 refills | Status: DC | PRN

## 2019-02-27 NOTE — Telephone Encounter (Signed)
Pt left message he is having 6 teeth pulled and 10 implants and wants to know if you will refill his generic Xanax [phone]?

## 2019-03-15 ENCOUNTER — Ambulatory Visit: Payer: Medicare PPO | Attending: Internal Medicine

## 2019-03-15 DIAGNOSIS — Z20822 Contact with and (suspected) exposure to covid-19: Secondary | ICD-10-CM | POA: Diagnosis not present

## 2019-03-16 ENCOUNTER — Encounter: Payer: Self-pay | Admitting: Family Medicine

## 2019-03-17 LAB — NOVEL CORONAVIRUS, NAA: SARS-CoV-2, NAA: NOT DETECTED

## 2019-03-20 ENCOUNTER — Ambulatory Visit: Payer: Medicare Other | Admitting: Family Medicine

## 2019-05-15 ENCOUNTER — Encounter: Payer: Self-pay | Admitting: Family Medicine

## 2019-05-15 ENCOUNTER — Ambulatory Visit: Payer: Medicare PPO | Admitting: Family Medicine

## 2019-05-15 ENCOUNTER — Other Ambulatory Visit: Payer: Self-pay

## 2019-05-15 VITALS — BP 144/86 | HR 76 | Temp 97.7°F | Ht 67.75 in | Wt 182.4 lb

## 2019-05-15 DIAGNOSIS — Z0000 Encounter for general adult medical examination without abnormal findings: Secondary | ICD-10-CM | POA: Diagnosis not present

## 2019-05-15 DIAGNOSIS — J309 Allergic rhinitis, unspecified: Secondary | ICD-10-CM | POA: Diagnosis not present

## 2019-05-15 DIAGNOSIS — M4807 Spinal stenosis, lumbosacral region: Secondary | ICD-10-CM | POA: Diagnosis not present

## 2019-05-15 DIAGNOSIS — M1990 Unspecified osteoarthritis, unspecified site: Secondary | ICD-10-CM

## 2019-05-15 DIAGNOSIS — Z1211 Encounter for screening for malignant neoplasm of colon: Secondary | ICD-10-CM

## 2019-05-15 DIAGNOSIS — E7439 Other disorders of intestinal carbohydrate absorption: Secondary | ICD-10-CM

## 2019-05-15 DIAGNOSIS — Z8249 Family history of ischemic heart disease and other diseases of the circulatory system: Secondary | ICD-10-CM | POA: Diagnosis not present

## 2019-05-15 DIAGNOSIS — Z131 Encounter for screening for diabetes mellitus: Secondary | ICD-10-CM | POA: Diagnosis not present

## 2019-05-15 DIAGNOSIS — F909 Attention-deficit hyperactivity disorder, unspecified type: Secondary | ICD-10-CM | POA: Diagnosis not present

## 2019-05-15 DIAGNOSIS — F40243 Fear of flying: Secondary | ICD-10-CM | POA: Diagnosis not present

## 2019-05-15 DIAGNOSIS — Z8546 Personal history of malignant neoplasm of prostate: Secondary | ICD-10-CM | POA: Diagnosis not present

## 2019-05-15 DIAGNOSIS — E785 Hyperlipidemia, unspecified: Secondary | ICD-10-CM

## 2019-05-15 DIAGNOSIS — M5136 Other intervertebral disc degeneration, lumbar region: Secondary | ICD-10-CM

## 2019-05-15 DIAGNOSIS — I10 Essential (primary) hypertension: Secondary | ICD-10-CM

## 2019-05-15 DIAGNOSIS — R7309 Other abnormal glucose: Secondary | ICD-10-CM

## 2019-05-15 LAB — POCT URINALYSIS DIP (PROADVANTAGE DEVICE)
Bilirubin, UA: NEGATIVE
Blood, UA: NEGATIVE
Glucose, UA: NEGATIVE mg/dL
Ketones, POC UA: NEGATIVE mg/dL
Leukocytes, UA: NEGATIVE
Nitrite, UA: NEGATIVE
Protein Ur, POC: NEGATIVE mg/dL
Specific Gravity, Urine: 1.01
Urobilinogen, Ur: 0.2
pH, UA: 6.5 (ref 5.0–8.0)

## 2019-05-15 LAB — COMPREHENSIVE METABOLIC PANEL
ALT: 28 IU/L (ref 0–44)
AST: 21 IU/L (ref 0–40)
Albumin/Globulin Ratio: 2.2 (ref 1.2–2.2)
Albumin: 4.6 g/dL (ref 3.7–4.7)
Alkaline Phosphatase: 74 IU/L (ref 39–117)
BUN/Creatinine Ratio: 17 (ref 10–24)
BUN: 14 mg/dL (ref 8–27)
Bilirubin Total: 0.8 mg/dL (ref 0.0–1.2)
CO2: 23 mmol/L (ref 20–29)
Calcium: 10.2 mg/dL (ref 8.6–10.2)
Chloride: 103 mmol/L (ref 96–106)
Creatinine, Ser: 0.82 mg/dL (ref 0.76–1.27)
GFR calc Af Amer: 103 mL/min/{1.73_m2} (ref >59)
GFR calc non Af Amer: 89 mL/min/{1.73_m2} (ref >59)
Globulin, Total: 2.1 g/dL (ref 1.5–4.5)
Glucose: 103 mg/dL — ABNORMAL HIGH (ref 65–99)
Potassium: 4.7 mmol/L (ref 3.5–5.2)
Sodium: 140 mmol/L (ref 134–144)
Total Protein: 6.7 g/dL (ref 6.0–8.5)

## 2019-05-15 LAB — CBC WITH DIFFERENTIAL/PLATELET
Basophils Absolute: 0 10*3/uL (ref 0.0–0.2)
Basos: 1 %
EOS (ABSOLUTE): 0.3 10*3/uL (ref 0.0–0.4)
Eos: 5 %
Hematocrit: 47.9 % (ref 37.5–51.0)
Hemoglobin: 16.5 g/dL (ref 13.0–17.7)
Immature Grans (Abs): 0 10*3/uL (ref 0.0–0.1)
Immature Granulocytes: 0 %
Lymphocytes Absolute: 1 10*3/uL (ref 0.7–3.1)
Lymphs: 18 %
MCH: 30.4 pg (ref 26.6–33.0)
MCHC: 34.4 g/dL (ref 31.5–35.7)
MCV: 88 fL (ref 79–97)
Monocytes Absolute: 0.7 10*3/uL (ref 0.1–0.9)
Monocytes: 13 %
Neutrophils Absolute: 3.5 10*3/uL (ref 1.4–7.0)
Neutrophils: 63 %
Platelets: 234 10*3/uL (ref 150–450)
RBC: 5.42 x10E6/uL (ref 4.14–5.80)
RDW: 13.2 % (ref 11.6–15.4)
WBC: 5.5 10*3/uL (ref 3.4–10.8)

## 2019-05-15 LAB — LIPID PANEL
Chol/HDL Ratio: 2.4 ratio (ref 0.0–5.0)
Cholesterol, Total: 167 mg/dL (ref 100–199)
HDL: 71 mg/dL (ref >39)
LDL Chol Calc (NIH): 75 mg/dL (ref 0–99)
Triglycerides: 119 mg/dL (ref 0–149)
VLDL Cholesterol Cal: 21 mg/dL (ref 5–40)

## 2019-05-15 LAB — POCT GLYCOSYLATED HEMOGLOBIN (HGB A1C): Hemoglobin A1C: 5.8 % — AB (ref 4.0–5.6)

## 2019-05-15 MED ORDER — ATORVASTATIN CALCIUM 40 MG PO TABS: 40.0000 mg | ORAL_TABLET | Freq: Every day | ORAL | 3 refills | Status: DC

## 2019-05-15 NOTE — Progress Notes (Signed)
Terry Hays is a 69 y.o. male who presents for annual wellness visit,CPE and follow-up on chronic medical conditions.  He is now being treated for ADD with Vyvanse.  This is being cared for by Dr. Haroldine Laws (pediatrics)?.  He does give a history of having difficulty in being treated when he was a child with Ritalin however is not been on it in quite some time.  This was brought about because he is now having to take care of his grandchildren and apparently has had some ADD related inattention issues.  He is also had 1 Shingrix shot and is not interested in having another one.  He continues on atorvastatin is having no aches or pains with that.  He is also taking losartan/HCTZ.  He does have a positive family history for heart disease.  He is being followed by Dr. Acie Fredrickson.  Did have recent blood work which did show a hemoglobin A1c of 5.8.  He does have a history of spinal stenosis and difficulty with back pain.  He has had epidurals but keeping this under control with a good exercise and stretching regimen.  He has seen Dr. Nelva Bush in the past for this.  He is comfortable with how he is doing now.  He has remote history of prostate cancer but that is over 15 years ago.  He does have Xanax available for when he flies which is now quite rare.  His allergies seem to be under good control.  He is now retired and helping to take care of his grandchildren.  He is also starting to play golf.  Social and family history was otherwise reviewed.   Immunizations and Health Maintenance Immunization History  Administered Date(s) Administered  . Fluad Quad(high Dose 65+) 12/02/2018  . Influenza, High Dose Seasonal PF 12/21/2013, 11/02/2016, 11/29/2017  . Influenza, Seasonal, Injecte, Preservative Fre 03/10/2012  . Influenza,inj,Quad PF,6+ Mos 12/23/2012  . Moderna SARS-COVID-2 Vaccination 03/27/2019, 04/24/2019  . Pneumococcal Conjugate-13 12/21/2013  . Pneumococcal Polysaccharide-23 10/29/1997  . Tdap 09/06/2001,  12/21/2013  . Zoster 03/09/2008   There are no preventive care reminders to display for this patient.  Last colonoscopy: 06/20/09 cologuard 2018 neg Last PSA: 11/02/16 Dentist: 2 month ago Ophtho: over a year Exercise: QD walking and a stretching program as well as playing golf.  Other doctors caring for patient include: Dr. Cathie Olden cardiology  [place]   Advanced Directives: Not on file information asked for    Depression screen:  See questionnaire below.     Depression screen [facility] 2/9 05/15/2019 03/17/2018 11/02/2016 12/19/2015 11/25/2015  Decreased Interest 0 0 0 0 0  Down, Depressed, Hopeless 0 0 0 0 0  PHQ - 2 Score 0 0 0 0 0    Fall Screen: See Questionaire below.   Fall Risk  05/15/2019 03/17/2018 11/02/2016 12/19/2015 11/25/2015  Falls in the past year? 0 0 No No No  Risk for fall due to : - - - - Other (Comment)    ADL screen:  See questionnaire below.  Functional Status Survey: Is the patient deaf or have difficulty hearing?: No Does the patient have difficulty seeing, even when wearing glasses/contacts?: No Does the patient have difficulty concentrating, remembering, or making decisions?: No Does the patient have difficulty walking or climbing stairs?: No Does the patient have difficulty dressing or bathing?: No Does the patient have difficulty doing errands alone such as visiting a doctor's office or shopping?: No   Review of Systems  Constitutional: -, -unexpected weight change, -anorexia, -  fatigue Allergy: -sneezing, -itching, -congestion Dermatology: denies changing moles, rash, lumps ENT: -runny nose, -ear pain, -sore throat,  Cardiology:  -chest pain, -palpitations, -orthopnea, Respiratory: -cough, -shortness of breath, -dyspnea on exertion, -wheezing,  Gastroenterology: -abdominal pain, -nausea, -vomiting, -diarrhea, -constipation, -dysphagia Hematology: -bleeding or bruising problems Musculoskeletal: -arthralgias, -myalgias, -joint swelling, -back pain,  - Ophthalmology: -vision changes,  Urology: -dysuria, -difficulty urinating,  -urinary frequency, -urgency, incontinence Neurology: -, -numbness, , -memory loss, -falls, -dizziness    PHYSICAL EXAM:  BP (!) 144/86 (BP Location: Left Arm, Patient Position: Sitting)   Pulse 76   Temp 97.7 F (36.5 C)   Ht 5' 7.75" (1.721 m)   Wt 182 lb 6.4 oz (82.7 kg)   SpO2 94%   BMI 27.94 kg/m   General Appearance: Alert, cooperative, no distress, appears stated age Head: Normocephalic, without obvious abnormality, atraumatic Eyes: PERRL, conjunctiva/corneas clear, EOM's intact, Ears: Normal TM's and external ear canals Nose: Nares normal, mucosa normal, no drainage or sinus   tenderness Throat: Lips, mucosa, and tongue normal; teeth and gums normal Neck: Supple, no lymphadenopathy, thyroid:no enlargement/tenderness/nodules; no carotid bruit or JVD Lungs: Clear to auscultation bilaterally without wheezes, rales or ronchi; respirations unlabored Heart: Regular rate and rhythm, S1 and S2 normal, no murmur, rub or gallop Abdomen: Soft, non-tender, nondistended, normoactive bowel sounds, no masses, no hepatosplenomegaly Skin: Skin color, texture, turgor normal, no rashes or lesions Lymph nodes: Cervical, supraclavicular, and axillary nodes normal Neurologic: CNII-XII intact, normal strength, sensation and gait; reflexes 2+ and symmetric throughout   Psych: Normal mood, affect, hygiene and grooming Hemoglobin A1c is 5.8 ASSESSMENT/PLAN: Routine general medical examination at a health care facility - Plan: Lipid Panel, CBC with Differential, Comprehensive metabolic panel, POCT Urinalysis DIP (Proadvantage Device)  Allergic rhinitis, unspecified seasonality, unspecified trigger  Dyslipidemia - Plan: Lipid Panel, atorvastatin (LIPITOR) 40 MG tablet  Family history of heart disease in male family member before age 71 - Plan: Lipid Panel, CBC with Differential, Comprehensive metabolic  panel  Attention deficit hyperactivity disorder (ADHD), unspecified ADHD type  Hypertension, unspecified type  History of prostate cancer  Spinal stenosis of lumbosacral region  Fear of flying  Degeneration of lumbar intervertebral disc  Arthritis  Screening for colon cancer - Plan: Cologuard  Glucose intolerance - Plan: Comprehensive metabolic panel, POCT glycosylated hemoglobin (Hb A1C) He will discuss his blood pressure with Dr. Acie Fredrickson with his next visit. No follow-up needed on the prostate cancer. He is doing very well taking care of himself in terms of back care and is now having no problems with that. I discussed the hemoglobin A1c with him indicating he is now glucose intolerant and the risk for diabetes.  Discussed the value of diet and exercise which he is doing. He will continue to be followed by Dr. Haroldine Laws for his Vyvanse.   Immunization recommendations discussed.  Colonoscopy recommendations reviewed.   Medicare Attestation I have personally reviewed: The patient's medical and social history Their use of alcohol, tobacco or illicit drugs Their current medications and supplements The patient's functional ability including ADLs,fall risks, home safety risks, cognitive, and hearing and visual impairment Diet and physical activities Evidence for depression or mood disorders  The patient's weight, height, and BMI have been recorded in the chart.  I have made referrals, counseling, and provided education to the patient based on review of the above and I have provided the patient with a written personalized care plan for preventive services.     Jill Alexanders, MD   05/15/2019

## 2019-05-15 NOTE — Patient Instructions (Signed)
  Mr. Lauriano , Thank you for taking time to come for your Medicare Wellness Visit. I appreciate your ongoing commitment to your health goals. Please review the following plan we discussed and let me know if I can assist you in the future.   These are the goals we discussed: Diet and exercise is the key!  This is a list of the screening recommended for you and due dates:  Health Maintenance  Topic Date Due  . Colon Cancer Screening  06/21/2019  . Tetanus Vaccine  12/22/2023  . Flu Shot  Completed  .  Hepatitis C: One time screening is recommended by Center for Disease Control  (CDC) for  adults born from [birth year] through 1967.   Completed  . Pneumonia vaccines  Discontinued

## 2019-05-17 ENCOUNTER — Other Ambulatory Visit: Payer: Medicare PPO

## 2019-05-19 ENCOUNTER — Ambulatory Visit: Payer: Medicare PPO

## 2019-06-13 DIAGNOSIS — Z1212 Encounter for screening for malignant neoplasm of rectum: Secondary | ICD-10-CM | POA: Diagnosis not present

## 2019-06-13 DIAGNOSIS — Z1211 Encounter for screening for malignant neoplasm of colon: Secondary | ICD-10-CM | POA: Diagnosis not present

## 2019-06-19 LAB — COLOGUARD
COLOGUARD: NEGATIVE
Cologuard: NEGATIVE

## 2019-06-21 NOTE — Progress Notes (Signed)
Pt was advised kH

## 2019-08-17 ENCOUNTER — Other Ambulatory Visit: Payer: Self-pay | Admitting: Cardiovascular Disease

## 2019-09-04 ENCOUNTER — Other Ambulatory Visit: Payer: Self-pay | Admitting: Cardiovascular Disease

## 2019-09-05 MED ORDER — LOSARTAN POTASSIUM 100 MG PO TABS: 100.0000 mg | ORAL_TABLET | Freq: Every day | ORAL | 0 refills | Status: DC

## 2019-10-09 ENCOUNTER — Encounter: Payer: Self-pay | Admitting: Cardiovascular Disease

## 2019-10-09 NOTE — Progress Notes (Signed)
Terry Hays Date of Birth  1950/12/31   [address]     [place], [place]  [address] [phone]  Fax  [phone]  Problem List 1. Hypertension 2. LVH 3. Hyperlipidemia 4.   Terry Hays is a middle-aged gentleman with a history of hypertension, left ventricular hypertrophy, Hypercholesterolemia and an abnormal EKG.  He's done very well since I last saw him one year ago. He still plays basketball on a regular basis. He's been saying little bit better hydrated and has noted that he is not having nearly as much dizziness. His energy levels have also been better.  June 10, 2012  Terry Hays is doing well.  He has had some orthopedic issues which has kept him out of the basketball court.  Jul 19, 2013:  Terry Hays is doing well.  He checks his blood pressure on regular basis at home and his readings are in the normal range. If it is a little high today in the office.  He remains very busy. He is doing a lot developing a [employer] . 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.  Oct. 5, 2016: Terry Hays is doing well. Still very active.  Plays basketball regularly . Still teaching at [employer].   No CP or dyspnea   Nov. 21, 2017:  Doing well from a cardiac standpoint.   Is injurred - bad back .   Has not been playing basketball  Does get some core exercises and stretches.  May 07, 2017:  Terry Hays is seen today for follow-up of his hypertension and hyperlipidemia. Has 2 new grandchildren ,  Spending lots of time with grandchildren .  Still nursing his back strain .   Lipids in Aug. Look good   September 20, 2018:  Terry Hays is seen today for follow-up of his hypertension and hyperlipidemia. Has retired ,  Enjoying his grandchildren.   Misses his basketball and water aerobics  His BP has increased .  We doubled his Losartan and BP is now back to normal  He was eating too much salt ( he used to play competitive basket ball and would ordinarily sweat out the extra salt )   October 10, 2019:   Terry Hays is seen today for follow-up visit.  He has a history of hypertension and hyperlipidemia.  He has been very active for review.  He typically plays basketball and does water aerobics.  Not playing basketball now due to spinal stenosos Doing lots of core exercises - has greatly improved his back pain  Does lots of stretches in the am  No CP , no dyspnea     Current Outpatient Medications on File Prior to Visit  Medication Sig Dispense Refill  . ALPRAZolam (XANAX) 0.25 MG tablet Take 1 tablet (0.25 mg total) by mouth at bedtime as needed for anxiety (FOR FLYING). 30 tablet 1  . aspirin EC 81 MG tablet Take 1 tablet (81 mg total) by mouth daily. 90 tablet 3  . atorvastatin (LIPITOR) 40 MG tablet Take 1 tablet (40 mg total) by mouth daily. 90 tablet 3  . hydrochlorothiazide (HYDRODIURIL) 25 MG tablet Take 1 tablet (25 mg total) by mouth daily. Please make yearly appt with Dr. Acie Fredrickson for July before anymore refills. 1st attempt 90 tablet 0  . losartan (COZAAR) 100 MG tablet Take 1 tablet (100 mg total) by mouth daily. Please keep upcoming appt in August with Dr. Acie Fredrickson for future refills. Thank you 90 tablet 0  . magnesium gluconate (  MAGONATE) 500 MG tablet Take 500 mg by mouth daily.    . potassium chloride (KLOR-CON) 10 MEQ tablet Take 1 tablet (10 mEq total) by mouth daily. Please make yearly appt with Dr. Acie Fredrickson for July before anymore refills. 1st attempt 90 tablet 0  . Sodium Fluoride (FLUORIDEX) 1.1 % PSTE Place 1 application onto teeth 2 (two) times daily.    . TURMERIC PO Take 1,000 mg by mouth daily.    Marland Kitchen VYVANSE 50 MG CHEW Chew 1 tablet by mouth every morning.     No current facility-administered medications on file prior to visit.    Allergies  Allergen Reactions  . Tramadol Other (See Comments)    "hallucinations"    Past Medical History:  Diagnosis Date  . Abnormal ECG   . Allergy   . Anxiety    FLYING  . Diverticulosis   . Dyslipidemia   . History of prostate cancer    . Hyperlipidemia   . Hypertension   . LVH (left ventricular hypertrophy)   . Pre-diabetes     Past Surgical History:  Procedure Laterality Date  . COLONOSCOPY  2004 2011   MAGOD  . EYE SURGERY  2001   CATOACTS BOTH  . [place]  . INGUINAL HERNIA REPAIR Left 04/07/2018   Procedure: OPERN LEFT INGUINAL HERNIA REPAIR WITH MESH ERAS PATHWAY;  Surgeon: Greer Pickerel, MD;  Location: [facility];  Service: General;  Laterality: Left;  . PENILE PROSTHESIS IMPLANT  2011   DAHLSTEDT  . PROSTATE SURGERY  2003   PROSTRATECTOMY    Social History   Tobacco Use  Smoking Status Former Smoker  . Quit date: 03/09/1976  . Years since quitting: 43.6  Smokeless Tobacco Never Used    Social History   Substance and Sexual Activity  Alcohol Use Yes  . Alcohol/week: 1.0 standard drink  . Types: 1 Glasses of wine per week    Family History  Problem Relation Age of Onset  . Heart attack Other   . Hyperlipidemia Other   . Cancer Other     Reviw of Systems:  Reviewed in the HPI.  All other systems are negative.  Physical Exam: Blood pressure (!) 164/80, pulse 70, height 5\' 7"  (1.702 m), weight 179 lb 3.2 oz (81.3 kg), SpO2 96 %.  GEN:   Elderly male,  NAD  HEENT: Normal NECK: No JVD; No carotid bruits LYMPHATICS: No lymphadenopathy CARDIAC: RRR , no murmurs, rubs, gallops RESPIRATORY:  Clear to auscultation without rales, wheezing or rhonchi  ABDOMEN: Soft, non-tender, non-distended MUSCULOSKELETAL:  No edema; No deformity  SKIN: Warm and dry NEUROLOGIC:  Alert and oriented x 3  ECG: October 10, 2019: Normal sinus rhythm at 69.  He has nonspecific T wave inversions in leads V4 through V6 which are unchanged from all of his previous EKGs.    Assessment / Plan:   1. Hypertension-   blood pressure is a little bit elevated.  He has been well controlled for years but he has stopped playing basketball because of back pain.  He is now been working on his core strength  his back pain is gradually improving.  I suspect that if he is able to get back into playing competitive basketball his blood pressure will probably get back down to normal on the HCTZ and losartan.  For now we will add amlodipine 5 mg a day.  I have advised him to check his blood pressure periodically.  I will  see him again in 3 months for follow-up visit.   3. Hyperlipidemia-  -lipids have been well controlled.  Continue atorvastatin 40 mg a day.  His labs from [facility] office in March were reviewed and his labs look good.  His LDL 75.  Triglyceride levels 119.  Total cholesterol is 167.   Mertie Moores, MD  10/10/2019 3:24 PM    [facility] [place],  [place],   [address] Pager [phone] Phone: [phone]; Fax: [phone]

## 2019-10-10 ENCOUNTER — Ambulatory Visit: Payer: Medicare PPO | Admitting: Cardiovascular Disease

## 2019-10-10 ENCOUNTER — Encounter: Payer: Self-pay | Admitting: Cardiovascular Disease

## 2019-10-10 ENCOUNTER — Other Ambulatory Visit: Payer: Self-pay

## 2019-10-10 VITALS — BP 164/80 | HR 70 | Ht 67.0 in | Wt 179.2 lb

## 2019-10-10 DIAGNOSIS — I10 Essential (primary) hypertension: Secondary | ICD-10-CM | POA: Diagnosis not present

## 2019-10-10 DIAGNOSIS — I517 Cardiomegaly: Secondary | ICD-10-CM | POA: Diagnosis not present

## 2019-10-10 MED ORDER — AMLODIPINE BESYLATE 5 MG PO TABS
5.0000 mg | ORAL_TABLET | Freq: Every day | ORAL | 3 refills | Status: DC
Start: 2019-10-10 — End: 2020-09-20

## 2019-10-10 NOTE — Patient Instructions (Signed)
Medication Instructions:  Your physician has recommended you make the following change in your medication:  1) START taking amlodipine 5 mg daily  *If you need a refill on your cardiac medications before your next appointment, please call your pharmacy*  Follow-Up: At [facility], you and your health needs are our priority.  As part of our continuing mission to provide you with exceptional heart care, we have created designated Provider Care Teams.  These Care Teams include your primary Cardiologist (physician) and Advanced Practice Providers (APPs -  Physician Assistants and Nurse Practitioners) who all work together to provide you with the care you need, when you need it.   Your next appointment:   3 month(s)  The format for your next appointment:   In Person  Provider:   You may see Mertie Moores, MD or one of the following Advanced Practice Providers on your designated Care Team:    Richardson Dopp, PA-C  [place], [place]

## 2019-10-23 ENCOUNTER — Encounter: Payer: Self-pay | Admitting: Family Medicine

## 2019-10-25 ENCOUNTER — Other Ambulatory Visit: Payer: Medicare PPO

## 2019-10-25 ENCOUNTER — Other Ambulatory Visit: Payer: Self-pay

## 2019-10-25 DIAGNOSIS — Z20822 Contact with and (suspected) exposure to covid-19: Secondary | ICD-10-CM | POA: Diagnosis not present

## 2019-10-26 LAB — NOVEL CORONAVIRUS, NAA: SARS-CoV-2, NAA: NOT DETECTED

## 2019-10-26 LAB — SARS-COV-2, NAA 2 DAY TAT

## 2019-11-11 ENCOUNTER — Other Ambulatory Visit: Payer: Self-pay | Admitting: Cardiovascular Disease

## 2019-11-30 ENCOUNTER — Other Ambulatory Visit: Payer: Self-pay | Admitting: Cardiovascular Disease

## 2020-01-15 ENCOUNTER — Encounter: Payer: Self-pay | Admitting: Cardiovascular Disease

## 2020-01-15 NOTE — Progress Notes (Signed)
Terry Hays Date of Birth  September 26, 1950   [address]     [place], [place]  [address] [phone]  Fax  [phone]  Problem List 1. Hypertension 2. LVH 3. Hyperlipidemia 4.   Terry Hays is a middle-aged gentleman with a history of hypertension, left ventricular hypertrophy, Hypercholesterolemia and an abnormal EKG.  He's done very well since I last saw him one year ago. He still plays basketball on a regular basis. He's been saying little bit better hydrated and has noted that he is not having nearly as much dizziness. His energy levels have also been better.  June 10, 2012  Terry Hays is doing well.  He has had some orthopedic issues which has kept him out of the basketball court.  Jul 19, 2013:  Terry Hays is doing well.  He checks his blood pressure on regular basis at home and his readings are in the normal range. If it is a little high today in the office.  He remains very busy. He is doing a lot developing a [employer] . 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.  Oct. 5, 2016: Terry Hays is doing well. Still very active.  Plays basketball regularly . Still teaching at [employer].   No CP or dyspnea   Nov. 21, 2017:  Doing well from a cardiac standpoint.   Is injurred - bad back .   Has not been playing basketball  Does get some core exercises and stretches.  May 07, 2017:  Terry Hays is seen today for follow-up of his hypertension and hyperlipidemia. Has 2 new grandchildren ,  Spending lots of time with grandchildren .  Still nursing his back strain .   Lipids in Aug. Look good   September 20, 2018:  Terry Hays is seen today for follow-up of his hypertension and hyperlipidemia. Has retired ,  Enjoying his grandchildren.   Misses his basketball and water aerobics  His BP has increased .  We doubled his Losartan and BP is now back to normal  He was eating too much salt ( he used to play competitive basket ball and would ordinarily sweat out the extra salt )   October 10, 2019:   Terry Hays is seen today for follow-up visit.  He has a history of hypertension and hyperlipidemia.  He has been very active for review.  He typically plays basketball and does water aerobics.  Not playing basketball now due to spinal stenosos Doing lots of core exercises - has greatly improved his back pain  Does lots of stretches in the am  No CP , no dyspnea   Nov. 8, 2021: Terry Hays is seen today for follow up of his HTN and HLD  Has developed spinal stenosis . Stretches and does plank exercises every day . BP is better after starting amlodipne ,  Losartan    Current Outpatient Medications on File Prior to Visit  Medication Sig Dispense Refill  . ALPRAZolam (XANAX) 0.25 MG tablet Take 1 tablet (0.25 mg total) by mouth at bedtime as needed for anxiety (FOR FLYING). 30 tablet 1  . amLODipine (NORVASC) 5 MG tablet Take 1 tablet (5 mg total) by mouth daily. 90 tablet 3  . aspirin EC 81 MG tablet Take 1 tablet (81 mg total) by mouth daily. 90 tablet 3  . atorvastatin (LIPITOR) 40 MG tablet Take 1 tablet (40 mg total) by mouth daily. 90 tablet 3  . hydrochlorothiazide (HYDRODIURIL) 25 MG tablet Take 1 tablet (25 mg  total) by mouth daily. 90 tablet 3  . losartan (COZAAR) 100 MG tablet Take 1 tablet (100 mg total) by mouth daily. 90 tablet 3  . magnesium gluconate (MAGONATE) 500 MG tablet Take 500 mg by mouth daily.    . potassium chloride (KLOR-CON) 10 MEQ tablet Take 1 tablet (10 mEq total) by mouth daily. 90 tablet 3  . TURMERIC PO Take 1,000 mg by mouth daily.    Marland Kitchen VYVANSE 10 MG CHEW Chew 1 tablet by mouth as needed.    Marland Kitchen VYVANSE 50 MG CHEW Chew 1 tablet by mouth every morning.     No current facility-administered medications on file prior to visit.    Allergies  Allergen Reactions  . Tramadol Other (See Comments)    "hallucinations"    Past Medical History:  Diagnosis Date  . Abnormal ECG   . Allergy   . Anxiety    FLYING  . Diverticulosis   . Dyslipidemia   . History of prostate  cancer   . Hyperlipidemia   . Hypertension   . LVH (left ventricular hypertrophy)   . Pre-diabetes     Past Surgical History:  Procedure Laterality Date  . COLONOSCOPY  2004 2011   MAGOD  . EYE SURGERY  2001   CATOACTS BOTH  . [place]  . INGUINAL HERNIA REPAIR Left 04/07/2018   Procedure: OPERN LEFT INGUINAL HERNIA REPAIR WITH MESH ERAS PATHWAY;  Surgeon: Greer Pickerel, MD;  Location: [facility];  Service: General;  Laterality: Left;  . PENILE PROSTHESIS IMPLANT  2011   DAHLSTEDT  . PROSTATE SURGERY  2003   PROSTRATECTOMY    Social History   Tobacco Use  Smoking Status Former Smoker  . Quit date: 03/09/1976  . Years since quitting: 43.8  Smokeless Tobacco Never Used    Social History   Substance and Sexual Activity  Alcohol Use Yes  . Alcohol/week: 1.0 standard drink  . Types: 1 Glasses of wine per week    Family History  Problem Relation Age of Onset  . Heart attack Other   . Hyperlipidemia Other   . Cancer Other     Reviw of Systems:  Reviewed in the HPI.  All other systems are negative.  Physical Exam: Blood pressure 122/62, pulse 64, height 5\' 7"  (1.702 m), weight 181 lb (82.1 kg), SpO2 96 %.  GEN:  Well nourished, well developed in no acute distress HEENT: Normal NECK: No JVD; No carotid bruits LYMPHATICS: No lymphadenopathy CARDIAC: RRR , no murmurs, rubs, gallops RESPIRATORY:  Clear to auscultation without rales, wheezing or rhonchi  ABDOMEN: Soft, non-tender, non-distended MUSCULOSKELETAL:  No edema; No deformity  SKIN: Warm and dry NEUROLOGIC:  Alert and oriented x 3  ECG: .    Assessment / Plan:   1. Hypertension-    blood pressure is well controlled on current medications of amlodipine, losartan, HCTZ.  He is not having any side effects.  We will continue with his current medications.  I will see him again in 1 year.   3. Hyperlipidemia-  -  We will check basic metabolic profile, lipids, liver enzymes in 1  year.   Mertie Moores, MD  01/16/2020 8:51 AM    [employer] [place],  [place], [place]  [address] Pager [phone] Phone: [phone]; Fax: [phone]

## 2020-01-16 ENCOUNTER — Other Ambulatory Visit: Payer: Self-pay

## 2020-01-16 ENCOUNTER — Ambulatory Visit: Payer: Medicare PPO | Admitting: Cardiovascular Disease

## 2020-01-16 ENCOUNTER — Encounter: Payer: Self-pay | Admitting: Cardiovascular Disease

## 2020-01-16 VITALS — BP 122/62 | HR 64 | Ht 67.0 in | Wt 181.0 lb

## 2020-01-16 DIAGNOSIS — I10 Essential (primary) hypertension: Secondary | ICD-10-CM

## 2020-01-16 DIAGNOSIS — E785 Hyperlipidemia, unspecified: Secondary | ICD-10-CM | POA: Diagnosis not present

## 2020-01-16 DIAGNOSIS — E782 Mixed hyperlipidemia: Secondary | ICD-10-CM | POA: Diagnosis not present

## 2020-01-16 DIAGNOSIS — I517 Cardiomegaly: Secondary | ICD-10-CM | POA: Diagnosis not present

## 2020-01-16 NOTE — Patient Instructions (Signed)
Medication Instructions:  Your physician recommends that you continue on your current medications as directed. Please refer to the Current Medication list given to you today.  *If you need a refill on your cardiac medications before your next appointment, please call your pharmacy*   Lab Work: Your physician recommends that you return for lab work in: 12 months on the day of or a few days before your office visit with Dr. Acie Fredrickson.  You will need to FAST for this appointment - nothing to eat or drink after midnight the night before except water.   If you have labs (blood work) drawn today and your tests are completely normal, you will receive your results only by: Marland Kitchen MyChart Message (if you have MyChart) OR . A paper copy in the mail If you have any lab test that is abnormal or we need to change your treatment, we will call you to review the results.   Testing/Procedures: None Ordered    Follow-Up: At [facility], you and your health needs are our priority.  As part of our continuing mission to provide you with exceptional heart care, we have created designated Provider Care Teams.  These Care Teams include your primary Cardiologist (physician) and Advanced Practice Providers (APPs -  Physician Assistants and Nurse Practitioners) who all work together to provide you with the care you need, when you need it.  Your next appointment:   1 year(s)  The format for your next appointment:   In Person  Provider:   You may see Mertie Moores, MD or one of the following Advanced Practice Providers on your designated Care Team:    Richardson Dopp, PA-C  [place], [place]

## 2020-04-15 DIAGNOSIS — M25552 Pain in left hip: Secondary | ICD-10-CM | POA: Diagnosis not present

## 2020-04-15 DIAGNOSIS — M25551 Pain in right hip: Secondary | ICD-10-CM | POA: Diagnosis not present

## 2020-04-15 DIAGNOSIS — M545 Low back pain, unspecified: Secondary | ICD-10-CM | POA: Diagnosis not present

## 2020-04-15 DIAGNOSIS — G894 Chronic pain syndrome: Secondary | ICD-10-CM | POA: Diagnosis not present

## 2020-04-22 DIAGNOSIS — M25551 Pain in right hip: Secondary | ICD-10-CM | POA: Diagnosis not present

## 2020-04-22 DIAGNOSIS — M25552 Pain in left hip: Secondary | ICD-10-CM | POA: Diagnosis not present

## 2020-04-22 DIAGNOSIS — M545 Low back pain, unspecified: Secondary | ICD-10-CM | POA: Diagnosis not present

## 2020-04-22 DIAGNOSIS — G894 Chronic pain syndrome: Secondary | ICD-10-CM | POA: Diagnosis not present

## 2020-04-29 DIAGNOSIS — G894 Chronic pain syndrome: Secondary | ICD-10-CM | POA: Diagnosis not present

## 2020-04-29 DIAGNOSIS — M25551 Pain in right hip: Secondary | ICD-10-CM | POA: Diagnosis not present

## 2020-04-29 DIAGNOSIS — M25552 Pain in left hip: Secondary | ICD-10-CM | POA: Diagnosis not present

## 2020-04-29 DIAGNOSIS — M545 Low back pain, unspecified: Secondary | ICD-10-CM | POA: Diagnosis not present

## 2020-05-07 DIAGNOSIS — M25551 Pain in right hip: Secondary | ICD-10-CM | POA: Diagnosis not present

## 2020-05-07 DIAGNOSIS — G894 Chronic pain syndrome: Secondary | ICD-10-CM | POA: Diagnosis not present

## 2020-05-07 DIAGNOSIS — M545 Low back pain, unspecified: Secondary | ICD-10-CM | POA: Diagnosis not present

## 2020-05-07 DIAGNOSIS — M25552 Pain in left hip: Secondary | ICD-10-CM | POA: Diagnosis not present

## 2020-05-13 DIAGNOSIS — M25552 Pain in left hip: Secondary | ICD-10-CM | POA: Diagnosis not present

## 2020-05-13 DIAGNOSIS — M25551 Pain in right hip: Secondary | ICD-10-CM | POA: Diagnosis not present

## 2020-05-13 DIAGNOSIS — M545 Low back pain, unspecified: Secondary | ICD-10-CM | POA: Diagnosis not present

## 2020-05-13 DIAGNOSIS — G894 Chronic pain syndrome: Secondary | ICD-10-CM | POA: Diagnosis not present

## 2020-05-15 ENCOUNTER — Other Ambulatory Visit: Payer: Self-pay | Admitting: Family Medicine

## 2020-05-15 DIAGNOSIS — E785 Hyperlipidemia, unspecified: Secondary | ICD-10-CM

## 2020-05-16 ENCOUNTER — Ambulatory Visit: Payer: Medicare PPO | Admitting: Family Medicine

## 2020-05-21 DIAGNOSIS — M25551 Pain in right hip: Secondary | ICD-10-CM | POA: Diagnosis not present

## 2020-05-21 DIAGNOSIS — G894 Chronic pain syndrome: Secondary | ICD-10-CM | POA: Diagnosis not present

## 2020-05-21 DIAGNOSIS — M545 Low back pain, unspecified: Secondary | ICD-10-CM | POA: Diagnosis not present

## 2020-05-21 DIAGNOSIS — M25552 Pain in left hip: Secondary | ICD-10-CM | POA: Diagnosis not present

## 2020-05-28 ENCOUNTER — Encounter: Payer: Self-pay | Admitting: Family Medicine

## 2020-05-28 ENCOUNTER — Other Ambulatory Visit: Payer: Self-pay

## 2020-05-28 ENCOUNTER — Ambulatory Visit (INDEPENDENT_AMBULATORY_CARE_PROVIDER_SITE_OTHER): Payer: Medicare PPO | Admitting: Family Medicine

## 2020-05-28 VITALS — BP 140/80 | HR 68 | Temp 97.4°F | Ht 67.5 in | Wt 185.8 lb

## 2020-05-28 DIAGNOSIS — M1990 Unspecified osteoarthritis, unspecified site: Secondary | ICD-10-CM | POA: Diagnosis not present

## 2020-05-28 DIAGNOSIS — Z0000 Encounter for general adult medical examination without abnormal findings: Secondary | ICD-10-CM

## 2020-05-28 DIAGNOSIS — I10 Essential (primary) hypertension: Secondary | ICD-10-CM | POA: Diagnosis not present

## 2020-05-28 DIAGNOSIS — F40243 Fear of flying: Secondary | ICD-10-CM

## 2020-05-28 DIAGNOSIS — M5136 Other intervertebral disc degeneration, lumbar region: Secondary | ICD-10-CM | POA: Diagnosis not present

## 2020-05-28 DIAGNOSIS — Z8546 Personal history of malignant neoplasm of prostate: Secondary | ICD-10-CM

## 2020-05-28 DIAGNOSIS — E785 Hyperlipidemia, unspecified: Secondary | ICD-10-CM | POA: Diagnosis not present

## 2020-05-28 DIAGNOSIS — M4807 Spinal stenosis, lumbosacral region: Secondary | ICD-10-CM

## 2020-05-28 DIAGNOSIS — Z131 Encounter for screening for diabetes mellitus: Secondary | ICD-10-CM

## 2020-05-28 DIAGNOSIS — Z8249 Family history of ischemic heart disease and other diseases of the circulatory system: Secondary | ICD-10-CM

## 2020-05-28 DIAGNOSIS — J309 Allergic rhinitis, unspecified: Secondary | ICD-10-CM | POA: Diagnosis not present

## 2020-05-28 LAB — POCT GLYCOSYLATED HEMOGLOBIN (HGB A1C): Hemoglobin A1C: 5.9 % — AB (ref 4.0–5.6)

## 2020-05-28 NOTE — Progress Notes (Signed)
Terry Hays is a 70 y.o. male who presents for annual wellness visit,CPEand follow-up on chronic medical conditions.  He has been involved in a physical therapy program to get himself ready to play basketball again.  His back seems to be in better shape and he is looking forward to this.  He does have a history of spinal stenosis.  He also was diagnosed with ADHD in the last several years and presently is on Vyvanse 50 mg with a supplement of the 10 mg.  This has been handled by Dr. Haroldine Laws in the past, a pediatrician who specializes in this.  He continues on atorvastatin and is having no difficulty with that.  Also is taking amlodipine and did cut back on taking his HCTZ due to his physical activity level and concern over dehydration.  Does have a history of fear of flying and does plan to fly in the next year or so. His allergies seem to be under good control.  Does have a remote history of prostate cancer. Immunizations and Health Maintenance Immunization History  Administered Date(s) Administered  . Fluad Quad(high Dose 65+) 12/02/2018  . Influenza, High Dose Seasonal PF 12/21/2013, 11/02/2016, 11/29/2017  . Influenza, Seasonal, Injecte, Preservative Fre 03/10/2012  . Influenza,inj,Quad PF,6+ Mos 12/23/2012  . Moderna Sars-Covid-2 Vaccination 03/27/2019, 04/24/2019  . Pneumococcal Conjugate-13 12/21/2013  . Pneumococcal Polysaccharide-23 10/29/1997  . Tdap 09/06/2001, 12/21/2013  . Zoster 03/09/2008   Health Maintenance Due  Topic Date Due  . COVID-19 Vaccine (3 - Moderna risk 4-dose series) 05/22/2019  . COLONOSCOPY (Pts 45-42yrs Insurance coverage will need to be confirmed)  06/21/2019  . INFLUENZA VACCINE  10/08/2019    Last colonoscopy: 06/20/2009 cologuard 06/19/19 Last PSA: 11/02/16 Dentist: Q month as of now  Ophtho: over two years Exercise: walking , biking, stretching one hour every day  Other doctors caring for patient include: Dr. Cathie Olden cardiology   Advanced  Directives: Does Patient Have a Medical Advance Directive?: Yes Type of Advance Directive: Living will,Out of facility DNR (pink MOST or yellow form) Does patient want to make changes to medical advance directive?: No - Patient declined  Depression screen:  See questionnaire below.     Depression screen [facility] 2/9 05/28/2020 05/15/2019 03/17/2018 11/02/2016 12/19/2015  Decreased Interest 0 0 0 0 0  Down, Depressed, Hopeless 0 0 0 0 0  PHQ - 2 Score 0 0 0 0 0    Fall Screen: See Questionaire below.   Fall Risk  05/28/2020 05/15/2019 03/17/2018 11/02/2016 12/19/2015  Falls in the past year? 0 0 0 No No  Number falls in past yr: 0 - - - -  Injury with Fall? 0 - - - -  Risk for fall due to : No Fall Risks - - - -  Follow up Falls evaluation completed - - - -    ADL screen:  See questionnaire below.  Functional Status Survey: Is the patient deaf or have difficulty hearing?: No Does the patient have difficulty seeing, even when wearing glasses/contacts?: No Does the patient have difficulty concentrating, remembering, or making decisions?: No Does the patient have difficulty walking or climbing stairs?: No Does the patient have difficulty dressing or bathing?: No Does the patient have difficulty doing errands alone such as visiting a doctor's office or shopping?: No   Review of Systems  Constitutional: -, -unexpected weight change, -anorexia, -fatigue Allergy: -sneezing, -itching, -congestion Dermatology: denies changing moles, rash, lumps ENT: -runny nose, -ear pain, -sore throat,  Cardiology:  -chest  pain, -palpitations, -orthopnea, Respiratory: -cough, -shortness of breath, -dyspnea on exertion, -wheezing,  Gastroenterology: -abdominal pain, -nausea, -vomiting, -diarrhea, -constipation, -dysphagia Hematology: -bleeding or bruising problems Musculoskeletal: -arthralgias, -myalgias, -joint swelling, -back pain, - Ophthalmology: -vision changes,  Urology: -dysuria, -difficulty urinating,   -urinary frequency, -urgency, incontinence Neurology: -, -numbness, , -memory loss, -falls, -dizziness    PHYSICAL EXAM: General Appearance: Alert, cooperative, no distress, appears stated age Head: Normocephalic, without obvious abnormality, atraumatic Eyes: PERRL, conjunctiva/corneas clear, EOM's intact,  Ears: Normal TM's and external ear canals Nose: Nares normal, mucosa normal, no drainage or sinus   tenderness Throat: Lips, mucosa, and tongue normal; teeth and gums normal Neck: Supple, no lymphadenopathy, thyroid:no enlargement/tenderness/nodules; no carotid bruit or JVD Lungs: Clear to auscultation bilaterally without wheezes, rales or ronchi; respirations unlabored Heart: Regular rate and rhythm, S1 and S2 normal, no murmur, rub or gallop Abdomen: Soft, non-tender, nondistended, normoactive bowel sounds, no masses, no hepatosplenomegaly Extremities: No clubbing, cyanosis or edema Pulses: 2+ and symmetric all extremities Skin: Skin color, texture, turgor normal, no rashes or lesions Lymph nodes: Cervical, supraclavicular, and axillary nodes normal Neurologic: CNII-XII intact, normal strength, sensation and gait; reflexes 2+ and symmetric throughout   Psych: Normal mood, affect, hygiene and grooming  ASSESSMENT/PLAN: Routine general medical examination at a health care facility - Plan: CBC with Differential/Platelet, Comprehensive metabolic panel, Lipid panel  Dyslipidemia - Plan: Lipid panel  Hypertension, unspecified type - Plan: CBC with Differential/Platelet, Comprehensive metabolic panel  Fear of flying  Arthritis  Spinal stenosis of lumbosacral region  Degeneration of lumbar intervertebral disc  Allergic rhinitis, unspecified seasonality, unspecified trigger  History of prostate cancer  Family history of heart disease in male family member before age 101  Diabetes mellitus screening - Plan: HgB A1c I encouraged him to continue with his present physical activity  level.  I explained that I will be happy to take care of his ADHD when he so desires.  Continue on his present medication regimen. . Immunization recommendations discussed.  Recommend he get the Shingrix vaccine.  Colonoscopy recommendations reviewed.   Medicare Attestation I have personally reviewed: The patient's medical and social history Their use of alcohol, tobacco or illicit drugs Their current medications and supplements The patient's functional ability including ADLs,fall risks, home safety risks, cognitive, and hearing and visual impairment Diet and physical activities Evidence for depression or mood disorders  The patient's weight, height, and BMI have been recorded in the chart.  I have made referrals, counseling, and provided education to the patient based on review of the above and I have provided the patient with a written personalized care plan for preventive services.     Jill Alexanders, MD   05/28/2020

## 2020-05-29 LAB — CBC WITH DIFFERENTIAL/PLATELET
Basophils Absolute: 0 10*3/uL (ref 0.0–0.2)
Basos: 1 %
EOS (ABSOLUTE): 0.2 10*3/uL (ref 0.0–0.4)
Eos: 3 %
Hematocrit: 47.2 % (ref 37.5–51.0)
Hemoglobin: 16.5 g/dL (ref 13.0–17.7)
Immature Grans (Abs): 0 10*3/uL (ref 0.0–0.1)
Immature Granulocytes: 0 %
Lymphocytes Absolute: 0.9 10*3/uL (ref 0.7–3.1)
Lymphs: 14 %
MCH: 31.2 pg (ref 26.6–33.0)
MCHC: 35 g/dL (ref 31.5–35.7)
MCV: 89 fL (ref 79–97)
Monocytes Absolute: 0.6 10*3/uL (ref 0.1–0.9)
Monocytes: 10 %
Neutrophils Absolute: 4.8 10*3/uL (ref 1.4–7.0)
Neutrophils: 72 %
Platelets: 236 10*3/uL (ref 150–450)
RBC: 5.29 x10E6/uL (ref 4.14–5.80)
RDW: 13.4 % (ref 11.6–15.4)
WBC: 6.6 10*3/uL (ref 3.4–10.8)

## 2020-05-29 LAB — COMPREHENSIVE METABOLIC PANEL
ALT: 32 IU/L (ref 0–44)
AST: 23 IU/L (ref 0–40)
Albumin/Globulin Ratio: 2.1 (ref 1.2–2.2)
Albumin: 4.7 g/dL (ref 3.7–4.7)
Alkaline Phosphatase: 75 IU/L (ref 44–121)
BUN/Creatinine Ratio: 18 (ref 10–24)
BUN: 14 mg/dL (ref 8–27)
Bilirubin Total: 0.6 mg/dL (ref 0.0–1.2)
CO2: 18 mmol/L — ABNORMAL LOW (ref 20–29)
Calcium: 9.9 mg/dL (ref 8.6–10.2)
Chloride: 104 mmol/L (ref 96–106)
Creatinine, Ser: 0.79 mg/dL (ref 0.76–1.27)
Globulin, Total: 2.2 g/dL (ref 1.5–4.5)
Glucose: 97 mg/dL (ref 65–99)
Potassium: 4.2 mmol/L (ref 3.5–5.2)
Sodium: 139 mmol/L (ref 134–144)
Total Protein: 6.9 g/dL (ref 6.0–8.5)
eGFR: 94 mL/min/{1.73_m2} (ref >59)

## 2020-05-29 LAB — LIPID PANEL
Chol/HDL Ratio: 2.4 ratio (ref 0.0–5.0)
Cholesterol, Total: 173 mg/dL (ref 100–199)
HDL: 72 mg/dL (ref >39)
LDL Chol Calc (NIH): 87 mg/dL (ref 0–99)
Triglycerides: 74 mg/dL (ref 0–149)
VLDL Cholesterol Cal: 14 mg/dL (ref 5–40)

## 2020-06-04 ENCOUNTER — Encounter: Payer: Self-pay | Admitting: Family Medicine

## 2020-06-04 DIAGNOSIS — M545 Low back pain, unspecified: Secondary | ICD-10-CM | POA: Diagnosis not present

## 2020-06-04 DIAGNOSIS — G894 Chronic pain syndrome: Secondary | ICD-10-CM | POA: Diagnosis not present

## 2020-06-04 DIAGNOSIS — M25551 Pain in right hip: Secondary | ICD-10-CM | POA: Diagnosis not present

## 2020-06-04 DIAGNOSIS — M25552 Pain in left hip: Secondary | ICD-10-CM | POA: Diagnosis not present

## 2020-06-09 ENCOUNTER — Other Ambulatory Visit: Payer: Self-pay | Admitting: Family Medicine

## 2020-06-09 DIAGNOSIS — E785 Hyperlipidemia, unspecified: Secondary | ICD-10-CM

## 2020-06-17 ENCOUNTER — Encounter: Payer: Self-pay | Admitting: Family Medicine

## 2020-06-17 ENCOUNTER — Telehealth: Payer: Medicare PPO | Admitting: Family Medicine

## 2020-06-17 VITALS — Temp 97.5°F | Ht 67.5 in | Wt 185.0 lb

## 2020-06-17 DIAGNOSIS — J019 Acute sinusitis, unspecified: Secondary | ICD-10-CM | POA: Diagnosis not present

## 2020-06-17 MED ORDER — AMOXICILLIN-POT CLAVULANATE 875-125 MG PO TABS: 1.0000 | ORAL_TABLET | Freq: Two times a day (BID) | ORAL | 0 refills | Status: DC

## 2020-06-17 NOTE — Progress Notes (Signed)
   Subjective:    Patient ID: Terry Hays, male    DOB: February 03, 1951, 70 y.o.   MRN: [id number]  HPI Documentation for virtual audio and video telecommunications through [facility] encounter: The patient was located at home. 2 patient identifiers used.  The provider was located in the office. The patient did consent to this visit and is aware of possible charges through their insurance for this visit. The other persons participating in this telemedicine service were none. Time spent on call was 5 minutes and in review of previous records >20 minutes total for counseling and coordination of care. This virtual service is not related to other E/M service within previous 7 days. He complains of a 10-day history of sinus congestion, rhinorrhea, postnasal drainage, slight sore throat and dry cough.  No fever, chills, earache.  He has had 2 - Covid tests.  Does have a previous history of difficulty with sinus infections.  Review of Systems     Objective:   Physical Exam Alert and in no distress.  Voice is slightly raspy.       Assessment & Plan:  Acute non-recurrent sinusitis, unspecified location - Plan: amoxicillin-clavulanate (AUGMENTIN) 875-125 MG tablet I explained that I thought his symptoms were consistent with a sinus infection.  He is to call me if not entirely better when he finishes the antibiotic.

## 2020-07-04 DIAGNOSIS — R3915 Urgency of urination: Secondary | ICD-10-CM | POA: Diagnosis not present

## 2020-07-04 DIAGNOSIS — M6281 Muscle weakness (generalized): Secondary | ICD-10-CM | POA: Diagnosis not present

## 2020-07-04 DIAGNOSIS — M62838 Other muscle spasm: Secondary | ICD-10-CM | POA: Diagnosis not present

## 2020-07-04 DIAGNOSIS — M6289 Other specified disorders of muscle: Secondary | ICD-10-CM | POA: Diagnosis not present

## 2020-07-17 DIAGNOSIS — M6289 Other specified disorders of muscle: Secondary | ICD-10-CM | POA: Diagnosis not present

## 2020-07-17 DIAGNOSIS — M62838 Other muscle spasm: Secondary | ICD-10-CM | POA: Diagnosis not present

## 2020-07-17 DIAGNOSIS — R3915 Urgency of urination: Secondary | ICD-10-CM | POA: Diagnosis not present

## 2020-07-17 DIAGNOSIS — M6281 Muscle weakness (generalized): Secondary | ICD-10-CM | POA: Diagnosis not present

## 2020-08-07 DIAGNOSIS — M6281 Muscle weakness (generalized): Secondary | ICD-10-CM | POA: Diagnosis not present

## 2020-08-07 DIAGNOSIS — M62838 Other muscle spasm: Secondary | ICD-10-CM | POA: Diagnosis not present

## 2020-08-07 DIAGNOSIS — M6289 Other specified disorders of muscle: Secondary | ICD-10-CM | POA: Diagnosis not present

## 2020-08-07 DIAGNOSIS — R3915 Urgency of urination: Secondary | ICD-10-CM | POA: Diagnosis not present

## 2020-09-05 ENCOUNTER — Other Ambulatory Visit: Payer: Self-pay | Admitting: Family Medicine

## 2020-09-05 DIAGNOSIS — E785 Hyperlipidemia, unspecified: Secondary | ICD-10-CM

## 2020-09-09 ENCOUNTER — Encounter: Payer: Self-pay | Admitting: Family Medicine

## 2020-09-10 MED ORDER — ALPRAZOLAM 0.25 MG PO TABS: 0.2500 mg | ORAL_TABLET | Freq: Every evening | ORAL | 1 refills | Status: DC | PRN

## 2020-09-20 ENCOUNTER — Other Ambulatory Visit: Payer: Self-pay | Admitting: Cardiovascular Disease

## 2020-09-26 DIAGNOSIS — Z20822 Contact with and (suspected) exposure to covid-19: Secondary | ICD-10-CM | POA: Diagnosis not present

## 2020-09-28 ENCOUNTER — Encounter: Payer: Self-pay | Admitting: Family Medicine

## 2020-09-30 ENCOUNTER — Encounter: Payer: Self-pay | Admitting: Family Medicine

## 2020-09-30 ENCOUNTER — Telehealth (INDEPENDENT_AMBULATORY_CARE_PROVIDER_SITE_OTHER): Payer: Medicare PPO | Admitting: Family Medicine

## 2020-09-30 ENCOUNTER — Other Ambulatory Visit: Payer: Self-pay

## 2020-09-30 VITALS — Temp 97.5°F | Ht 67.0 in | Wt 178.0 lb

## 2020-09-30 DIAGNOSIS — I10 Essential (primary) hypertension: Secondary | ICD-10-CM | POA: Diagnosis not present

## 2020-09-30 DIAGNOSIS — U071 COVID-19: Secondary | ICD-10-CM

## 2020-09-30 MED ORDER — MOLNUPIRAVIR EUA 200MG CAPSULE: 4.0000 | ORAL_CAPSULE | Freq: Two times a day (BID) | ORAL | 0 refills | Status: AC

## 2020-09-30 NOTE — Progress Notes (Signed)
Start time: 1:26 End time: 1:52  Virtual Visit via Video Note  I connected with Terry Hays on 09/30/20 by a video enabled telemedicine application and verified that I am speaking with the correct person using two identifiers.  Location: Patient: home Provider: office   I discussed the limitations of evaluation and management by telemedicine and the availability of in person appointments. The patient expressed understanding and agreed to proceed.  History of Present Illness:  Chief Complaint  Patient presents with   Covid Positive    VIRTUAL positive covid Saturday. Had positive exposure on Thursday of last week and went to [facility] and had PCR done. Symptoms started Saturday and home test was positive. Body aches started Sat. Coughed so much last night he thought he was going to die. Took Nyquil last night and Dayquil this am and feeling somewhat better, body is killing him from coughing. Increased Gatorade intake.     Known exposure last week, negative PCR on Thursday.  Developed symptoms on Saturday 7/23, and had + home test. He had body aches, cough started Saturday night. Yesterday cough got worse.  He gets up a small amount of phlegm (didn't look at color).  His R nose felt blocked, couldn't sleep, took Nyquil, which helped him sleep.  Woke up this morning, coughed some, took Dayquil, which also helped. Body is now sore from coughing. He took Aleve, which helped a lot.   PMH, PSH, SH reviewed HTN, HLD, ADD, pre-diabetes, h/o prostate cancer GFR 94 on 05/2020 labs  Outpatient Encounter Medications as of 09/30/2020  Medication Sig Note   amLODipine (NORVASC) 5 MG tablet TAKE 1 TABLET BY MOUTH EVERY DAY    aspirin EC 81 MG tablet Take 1 tablet (81 mg total) by mouth daily.    atorvastatin (LIPITOR) 40 MG tablet TAKE 1 TABLET BY MOUTH EVERY DAY    hydrochlorothiazide (HYDRODIURIL) 25 MG tablet Take 1 tablet (25 mg total) by mouth daily.    losartan (COZAAR) 100 MG tablet Take 1  tablet (100 mg total) by mouth daily.    magnesium gluconate (MAGONATE) 500 MG tablet Take 500 mg by mouth daily.    potassium chloride (KLOR-CON) 10 MEQ tablet Take 1 tablet (10 mEq total) by mouth daily.    Pseudoeph-Doxylamine-DM-APAP (NYQUIL PO) Take 30 mLs by mouth as needed.    Pseudoephedrine-APAP-DM (DAYQUIL PO) Take 30 mLs by mouth as needed. 09/30/2020: Last dose 8am   TURMERIC PO Take 1,000 mg by mouth daily.    ALPRAZolam (XANAX) 0.25 MG tablet Take 1 tablet (0.25 mg total) by mouth at bedtime as needed for anxiety (FOR FLYING). (Patient not taking: Reported on 09/30/2020)    VYVANSE 10 MG CHEW Chew 1 tablet by mouth as needed. (Patient not taking: Reported on 09/30/2020)    VYVANSE 50 MG CHEW Chew 1 tablet by mouth every morning. (Patient not taking: Reported on 09/30/2020)    [DISCONTINUED] amoxicillin-clavulanate (AUGMENTIN) 875-125 MG tablet Take 1 tablet by mouth 2 (two) times daily.    No facility-administered encounter medications on file as of 09/30/2020.   Allergies  Allergen Reactions   Tramadol Other (See Comments)    "hallucinations"   ROS:  URI symptoms per HPI. No f/c/n/v/d, no loss of taste/smell. +cough, no DOE (just once when he "zipped" up the stairs.) No edema, bleeding, bruising, rash, or other complaints.    Observations/Objective:  Temp (!) 97.5 F (36.4 C) (Temporal)   Ht '5\' 7"'$  (1.702 m)   Wt 178 lb (  80.7 kg)   BMI 27.88 kg/m   Pleasant, well-appearing male, in no distress. No coughing or throat-clearing during visit. He is speaking comfortably Exam is limited due to the virtual nature of the visit   Assessment and Plan:  COVID-19 virus infection - preferred to use molnupiravir rather than hold statin while on Paxlovid. Supportive measures reviewed. Discussed s/sx for which he should seek re-eval - Plan: molnupiravir EUA 200 mg CAPS  Hypertension, unspecified type - advised to avoid decongestants (especially if not closely monitoring  BP)    Follow Up Instructions:    I discussed the assessment and treatment plan with the patient. The patient was provided an opportunity to ask questions and all were answered. The patient agreed with the plan and demonstrated an understanding of the instructions.   The patient was advised to call back or seek an in-person evaluation if the symptoms worsen or if the condition fails to improve as anticipated.  I spent 28 minutes dedicated to the care of this patient, including pre-visit review of records, face to face time, post-visit ordering of testing and documentation.    Vikki Ports, MD

## 2020-09-30 NOTE — Patient Instructions (Addendum)
  Take the antiviral medication twice daily as directed. Stop Dayquil/Nyquil--I prefer you NOT to take those medications. It contains phenylephrine which can raise blood pressure. Instead, take Mucinex-DM twice daily (don't take with other over-the-counter medications, which may duplicate some of the ingredients) Use Afrin SHORT TERM ONLY at night if you can't sleep due to stuffed nose. You can use nasal saline and sinus rinses if needed for sinus pain.  Stay well hydrated.  Continue to use tylenol or Aleve for body aches or pain. Be sure to take aleve with food. Send [place] a message if you need more cough medication--be specific in letting us know if the cough is mainly at night or during the day.  Isolate at home until Friday. If you are not having fever and feeling much better, you can leave isolation, but need to continue to be masked 100% of the time when around others for another 5 days. If Suzy Bouchard comes home, I would continue to try and quarantine/isolate from her until the full 10 days (stay in separate rooms, wear your mask when around her, don't eat together).  If you aren't feeling better by day 6, then isolate for the full 10 days (Sunday counts as day 1).  I hope you feel better soon!

## 2020-10-04 ENCOUNTER — Encounter: Payer: Self-pay | Admitting: Family Medicine

## 2020-10-10 DIAGNOSIS — H524 Presbyopia: Secondary | ICD-10-CM | POA: Diagnosis not present

## 2020-10-10 DIAGNOSIS — H43813 Vitreous degeneration, bilateral: Secondary | ICD-10-CM | POA: Diagnosis not present

## 2020-10-10 DIAGNOSIS — H10413 Chronic giant papillary conjunctivitis, bilateral: Secondary | ICD-10-CM | POA: Diagnosis not present

## 2020-11-09 ENCOUNTER — Other Ambulatory Visit: Payer: Self-pay | Admitting: Cardiovascular Disease

## 2020-11-28 ENCOUNTER — Other Ambulatory Visit: Payer: Self-pay | Admitting: Cardiovascular Disease

## 2020-11-28 ENCOUNTER — Other Ambulatory Visit: Payer: Self-pay | Admitting: Family Medicine

## 2020-11-28 DIAGNOSIS — E785 Hyperlipidemia, unspecified: Secondary | ICD-10-CM

## 2020-12-01 ENCOUNTER — Encounter: Payer: Self-pay | Admitting: Family Medicine

## 2020-12-26 DIAGNOSIS — R32 Unspecified urinary incontinence: Secondary | ICD-10-CM | POA: Diagnosis not present

## 2020-12-26 DIAGNOSIS — M4800 Spinal stenosis, site unspecified: Secondary | ICD-10-CM | POA: Diagnosis not present

## 2020-12-26 DIAGNOSIS — I10 Essential (primary) hypertension: Secondary | ICD-10-CM | POA: Diagnosis not present

## 2020-12-26 DIAGNOSIS — F419 Anxiety disorder, unspecified: Secondary | ICD-10-CM | POA: Diagnosis not present

## 2020-12-26 DIAGNOSIS — J309 Allergic rhinitis, unspecified: Secondary | ICD-10-CM | POA: Diagnosis not present

## 2020-12-26 DIAGNOSIS — Z7722 Contact with and (suspected) exposure to environmental tobacco smoke (acute) (chronic): Secondary | ICD-10-CM | POA: Diagnosis not present

## 2020-12-26 DIAGNOSIS — E785 Hyperlipidemia, unspecified: Secondary | ICD-10-CM | POA: Diagnosis not present

## 2020-12-26 DIAGNOSIS — M1990 Unspecified osteoarthritis, unspecified site: Secondary | ICD-10-CM | POA: Diagnosis not present

## 2020-12-26 DIAGNOSIS — F909 Attention-deficit hyperactivity disorder, unspecified type: Secondary | ICD-10-CM | POA: Diagnosis not present

## 2021-02-05 ENCOUNTER — Other Ambulatory Visit: Payer: Self-pay | Admitting: Cardiovascular Disease

## 2021-02-19 ENCOUNTER — Other Ambulatory Visit: Payer: Self-pay

## 2021-02-19 ENCOUNTER — Ambulatory Visit: Payer: Medicare PPO | Admitting: Medical

## 2021-02-19 VITALS — BP 120/70 | HR 74 | Temp 97.0°F | Wt 184.6 lb

## 2021-02-19 DIAGNOSIS — H68011 Acute Eustachian salpingitis, right ear: Secondary | ICD-10-CM | POA: Diagnosis not present

## 2021-02-19 DIAGNOSIS — H938X1 Other specified disorders of right ear: Secondary | ICD-10-CM

## 2021-02-19 MED ORDER — CLARITHROMYCIN 500 MG PO TABS: 500.0000 mg | ORAL_TABLET | Freq: Two times a day (BID) | ORAL | 0 refills | Status: DC

## 2021-02-19 MED ORDER — PREDNISONE 20 MG PO TABS: 20.0000 mg | ORAL_TABLET | Freq: Every day | ORAL | 0 refills | Status: DC

## 2021-02-19 NOTE — Progress Notes (Signed)
Subjective:  Terry Hays is a 70 y.o. male who presents for Chief Complaint  Patient presents with   ear pain    Ear pain, and itching x 5-6 day. Took some of wife medication for ear and helped alittle bit. Taking mometazone furate solution 0.4% 3 times a day, -     Here for ear pain x 5-6 days.   Right ear only.   No sore throat, no fever, no sinus pressure, no headache. Had sinus infection 6-8 weeks ago.   Was using nasal saline flush and Flonase daily since then.   No teeth pain, no dizziness.  No cough, no fever, no NVD.   He has hx/o chronic sinus problems before covid.  After 2020 did fine for 2 years but now having sinus problems again.   No other aggravating or relieving factors.    No other c/o.  The following portions of the patient's history were reviewed and updated as appropriate: allergies, current medications, past family history, past medical history, past social history, past surgical history and problem list.  ROS Otherwise as in subjective above  Objective: BP 120/70    Pulse 74    Temp (!) 97 F (36.1 C)    Wt 184 lb 9.6 oz (83.7 kg)    BMI 28.91 kg/m   General appearance: alert, no distress, well developed, well nourished HEENT: normocephalic, sclerae anicteric, conjunctiva pink and moist, TMs somewhat bulging but no erythema, nares patent, no right nares with erythema and mucoid discharge, left nostril normal, pharynx normal Oral cavity: MMM, no lesions Neck: supple, no lymphadenopathy, no thyromegaly, no masses    Assessment: Encounter Diagnoses  Name Primary?   Ear pressure, right Yes   Eustachian salpingitis, acute, right      Plan: Begin medications below, rest, hydrate well, continue nasal saline and Flonase.  Avoid decongestants given HTN history.   Can stop the ear steroid he had been using.  If not completely resolved within 10 days then call back.  Hold off on statin while on antibiotic   Terry Hays was seen today for ear pain.  Diagnoses and  all orders for this visit:  Ear pressure, right  Eustachian salpingitis, acute, right  Other orders -     clarithromycin (BIAXIN) 500 MG tablet; Take 1 tablet (500 mg total) by mouth 2 (two) times daily. -     predniSONE (DELTASONE) 20 MG tablet; Take 1 tablet (20 mg total) by mouth daily with breakfast.   Follow up: prn

## 2021-02-25 ENCOUNTER — Other Ambulatory Visit: Payer: Self-pay | Admitting: Cardiovascular Disease

## 2021-02-26 ENCOUNTER — Other Ambulatory Visit: Payer: Self-pay | Admitting: Family Medicine

## 2021-02-26 DIAGNOSIS — E785 Hyperlipidemia, unspecified: Secondary | ICD-10-CM

## 2021-03-11 ENCOUNTER — Encounter: Payer: Self-pay | Admitting: Cardiovascular Disease

## 2021-03-11 NOTE — Progress Notes (Signed)
Terry Hays Date of Birth  09-20-1950   [address]     [place], [place]  [id number] [phone]  Fax  [phone]  Problem List 1. Hypertension 2. LVH 3. Hyperlipidemia 4.   Terry Hays is a middle-aged gentleman with a history of hypertension, left ventricular hypertrophy, Hypercholesterolemia and an abnormal EKG.  He's done very well since I last saw him one year ago. He still plays basketball on a regular basis. He's been saying little bit better hydrated and has noted that he is not having nearly as much dizziness. His energy levels have also been better.  June 10, 2012  Terry Hays is doing well.  He has had some orthopedic issues which has kept him out of the basketball court.  Jul 19, 2013:  Terry Hays is doing well.  He checks his blood pressure on regular basis at home and his readings are in the normal range. If it is a little high today in the office.  He remains very busy. He is doing a lot developing a [employer] . 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.  Oct. 5, 2016: Terry Hays is doing well. Still very active.  Plays basketball regularly . Still teaching at [employer].   No CP or dyspnea   Nov. 21, 2017:  Doing well from a cardiac standpoint.   Is injurred - bad back .   Has not been playing basketball  Does get some core exercises and stretches.  May 07, 2017:  Terry Hays is seen today for follow-up of his hypertension and hyperlipidemia. Has 2 new grandchildren ,  Spending lots of time with grandchildren .  Still nursing his back strain .   Lipids in Aug. Look good   September 20, 2018:  Terry Hays is seen today for follow-up of his hypertension and hyperlipidemia. Has retired ,  Enjoying his grandchildren.   Misses his basketball and water aerobics  His BP has increased .  We doubled his Losartan and BP is now back to normal  He was eating too much salt ( he used to play competitive basket ball and would ordinarily sweat out the extra salt )   October 10, 2019:   Terry Hays is seen today for follow-up visit.  He has a history of hypertension and hyperlipidemia.  He has been very active for review.  He typically plays basketball and does water aerobics.  Not playing basketball now due to spinal stenosos Doing lots of core exercises - has greatly improved his back pain  Does lots of stretches in the am  No CP , no dyspnea   Nov. 8, 2021: Terry Hays is seen today for follow up of his HTN and HLD  Has developed spinal stenosis . Stretches and does plank exercises every day . BP is better after starting amlodipne ,  Losartan   Jan. 4, 2023 Terry Hays is seen today for follow up of his HTN and HLD  Has developed spinal stenosis  His EKG chronically showed normal sinus rhythm.  Chronic T wave inversions in leads V3 through V4 through V6.  Otherwise his EKG is normal.  Back is better .  Hard work getting through his spinal stenosis  Is back playing basket ball.  BP hs been normal  He stopped taking the HCTZ ( due to incontinence issues)   Current Outpatient Medications on File Prior to Visit  Medication Sig Dispense Refill   ALPRAZolam (XANAX) 0.25 MG tablet Take 1 tablet (0.25 mg total)  by mouth at bedtime as needed for anxiety (FOR FLYING). 30 tablet 1   amLODipine (NORVASC) 5 MG tablet TAKE 1 TABLET BY MOUTH EVERY DAY 90 tablet 1   aspirin EC 81 MG tablet Take 1 tablet (81 mg total) by mouth daily. 90 tablet 3   atorvastatin (LIPITOR) 40 MG tablet TAKE 1 TABLET BY MOUTH EVERY DAY 90 tablet 0   losartan (COZAAR) 100 MG tablet Take 1 tablet (100 mg total) by mouth daily. Please keep upcoming appt in January 2023 with Dr. Acie Fredrickson before anymore refills. Thank you 90 tablet 0   magnesium gluconate (MAGONATE) 500 MG tablet Take 500 mg by mouth daily.     Pseudoeph-Doxylamine-DM-APAP (NYQUIL PO) Take 30 mLs by mouth as needed.     Pseudoephedrine-APAP-DM (DAYQUIL PO) Take 30 mLs by mouth as needed.     TURMERIC PO Take 1,000 mg by mouth daily.     VYVANSE 10 MG CHEW Chew 1  tablet by mouth as needed.     VYVANSE 50 MG CHEW Chew 1 tablet by mouth every morning.     clarithromycin (BIAXIN) 500 MG tablet Take 1 tablet (500 mg total) by mouth 2 (two) times daily. (Patient not taking: Reported on 03/12/2021) 20 tablet 0   predniSONE (DELTASONE) 20 MG tablet Take 1 tablet (20 mg total) by mouth daily with breakfast. (Patient not taking: Reported on 03/12/2021) 4 tablet 0   No current facility-administered medications on file prior to visit.    Allergies  Allergen Reactions   Tramadol Other (See Comments)    "hallucinations"    Past Medical History:  Diagnosis Date   Abnormal ECG    Allergy    Anxiety    FLYING   Diverticulosis    Dyslipidemia    History of prostate cancer    Hyperlipidemia    Hypertension    LVH (left ventricular hypertrophy)    Pre-diabetes     Past Surgical History:  Procedure Laterality Date   COLONOSCOPY  2004 2011   [place] SURGERY  2001   CATOACTS BOTH   HERNIA REPAIR  1954   R INGHINAL   INGUINAL HERNIA REPAIR Left 04/07/2018   Procedure: OPERN LEFT INGUINAL HERNIA REPAIR WITH [place];  Surgeon: Greer Pickerel, MD;  Location: [facility];  Service: General;  Laterality: Left;   PENILE PROSTHESIS IMPLANT  2011   DAHLSTEDT   PROSTATE SURGERY  2003   PROSTRATECTOMY    Social History   Tobacco Use  Smoking Status Former   Types: Cigarettes   Quit date: 03/09/1976   Years since quitting: 45.0  Smokeless Tobacco Never    Social History   Substance and Sexual Activity  Alcohol Use Yes   Alcohol/week: 1.0 standard drink   Types: 1 Glasses of wine per week    Family History  Problem Relation Age of Onset   Heart attack Other    Hyperlipidemia Other    Cancer Other     Reviw of Systems:  Reviewed in the HPI.  All other systems are negative.  Physical Exam:  Physical Exam: Blood pressure 138/72, pulse 69, height 5\' 7"  (1.702 m), weight 186 lb (84.4 kg), SpO2 98 %.  GEN:  Well nourished, well developed  in no acute distress HEENT: Normal NECK: No JVD; No carotid bruits LYMPHATICS: No lymphadenopathy CARDIAC: RRR , no murmurs, rubs, gallops RESPIRATORY:  Clear to auscultation without rales, wheezing or rhonchi  ABDOMEN: Soft, non-tender, non-distended MUSCULOSKELETAL:  No edema;  No deformity  SKIN: Warm and dry NEUROLOGIC:  Alert and oriented x 3   ECG: .    Assessment / Plan:   1. Hypertension-     will add HCTZ back in 4 days a week - he had stopped it Change potassium to 10 meq 4 times a day    3. Hyperlipidemia-  -   Labs look great .  His primiary md will check in march    Mertie Moores, MD  03/12/2021 5:50 PM    [address],  [place], [place]  [id number] Pager [phone] Phone: [phone]; Fax: [phone]

## 2021-03-12 ENCOUNTER — Other Ambulatory Visit: Payer: Self-pay

## 2021-03-12 ENCOUNTER — Ambulatory Visit: Payer: Medicare PPO | Admitting: Cardiovascular Disease

## 2021-03-12 ENCOUNTER — Encounter: Payer: Self-pay | Admitting: Cardiovascular Disease

## 2021-03-12 VITALS — BP 138/72 | HR 69 | Ht 67.0 in | Wt 186.0 lb

## 2021-03-12 DIAGNOSIS — I517 Cardiomegaly: Secondary | ICD-10-CM | POA: Diagnosis not present

## 2021-03-12 DIAGNOSIS — I10 Essential (primary) hypertension: Secondary | ICD-10-CM

## 2021-03-12 DIAGNOSIS — E785 Hyperlipidemia, unspecified: Secondary | ICD-10-CM

## 2021-03-12 MED ORDER — HYDROCHLOROTHIAZIDE 25 MG PO TABS: 25.0000 mg | ORAL_TABLET | ORAL | 3 refills | Status: DC

## 2021-03-12 MED ORDER — POTASSIUM CHLORIDE ER 10 MEQ PO TBCR: 10.0000 meq | EXTENDED_RELEASE_TABLET | ORAL | 3 refills | Status: DC

## 2021-03-12 NOTE — Patient Instructions (Signed)
Medication Instructions:  Your physician has recommended you make the following change in your medication:  TAKE: Hydrochlorothiazide (HCTZ)  25 mg by mouth once daily 4 days per week TAKE: Potassium 10 mEq by mouth once daily 4 days per week *If you need a refill on your cardiac medications before your next appointment, please call your pharmacy*   Lab Work: NONE If you have labs (blood work) drawn today and your tests are completely normal, you will receive your results only by: [place] (if you have MyChart) OR A paper copy in the mail If you have any lab test that is abnormal or we need to change your treatment, we will call you to review the results.   Testing/Procedures: NONE   Follow-Up: At [facility], you and your health needs are our priority.  As part of our continuing mission to provide you with exceptional heart care, we have created designated Provider Care Teams.  These Care Teams include your primary Cardiologist (physician) and Advanced Practice Providers (APPs -  Physician Assistants and Nurse Practitioners) who all work together to provide you with the care you need, when you need it.   Your next appointment:   1 year(s)  The format for your next appointment:   In Person  Provider:   Mertie Moores, MD  or Robbie Lis, PA-C or Richardson Dopp, [place]      }

## 2021-03-18 ENCOUNTER — Other Ambulatory Visit: Payer: Self-pay | Admitting: Cardiovascular Disease

## 2021-04-02 DIAGNOSIS — Z8546 Personal history of malignant neoplasm of prostate: Secondary | ICD-10-CM | POA: Diagnosis not present

## 2021-04-02 DIAGNOSIS — N3000 Acute cystitis without hematuria: Secondary | ICD-10-CM | POA: Diagnosis not present

## 2021-05-06 ENCOUNTER — Other Ambulatory Visit: Payer: Self-pay | Admitting: Cardiovascular Disease

## 2021-05-27 ENCOUNTER — Other Ambulatory Visit: Payer: Self-pay | Admitting: Cardiovascular Disease

## 2021-05-28 ENCOUNTER — Other Ambulatory Visit: Payer: Self-pay | Admitting: Family Medicine

## 2021-05-28 DIAGNOSIS — E785 Hyperlipidemia, unspecified: Secondary | ICD-10-CM

## 2021-05-30 ENCOUNTER — Other Ambulatory Visit: Payer: Self-pay

## 2021-05-30 ENCOUNTER — Ambulatory Visit (INDEPENDENT_AMBULATORY_CARE_PROVIDER_SITE_OTHER): Payer: Medicare PPO

## 2021-05-30 VITALS — BP 134/70 | HR 80 | Temp 97.5°F | Ht 67.5 in | Wt 188.0 lb

## 2021-05-30 DIAGNOSIS — Z0000 Encounter for general adult medical examination without abnormal findings: Secondary | ICD-10-CM | POA: Diagnosis not present

## 2021-05-30 NOTE — Patient Instructions (Signed)
Terry Hays , ?Thank you for taking time to come for your Medicare Wellness Visit. I appreciate your ongoing commitment to your health goals. Please review the following plan we discussed and let me know if I can assist you in the future.  ? ?Screening recommendations/referrals: ?Colonoscopy: cologuard 06/19/2019, due 06/19/2022 ?Recommended yearly ophthalmology/optometry visit for glaucoma screening and checkup ?Recommended yearly dental visit for hygiene and checkup ? ?Vaccinations: ?Influenza vaccine: completed 12/17/2020, due next flu season ?Pneumococcal vaccine: completed 12/21/2013 ?Tdap vaccine: completed 12/21/2013, due 12/22/2023 ?Shingles vaccine: discussed   ?Covid-19:  12/17/2020, 04/24/2019, 03/27/2019 ? ?Advanced directives: Please bring a copy of your POA (Power of Attorney) and/or Living Will to your next appointment.  ? ?Conditions/risks identified: none ? ?Next appointment: Follow up in one year for your annual wellness visit.  ? ?Preventive Care 3 Years and Older, Male ?Preventive care refers to lifestyle choices and visits with your health care provider that can promote health and wellness. ?What does preventive care include? ?A yearly physical exam. This is also called an annual well check. ?Dental exams once or twice a year. ?Routine eye exams. Ask your health care provider how often you should have your eyes checked. ?Personal lifestyle choices, including: ?Daily care of your teeth and gums. ?Regular physical activity. ?Eating a healthy diet. ?Avoiding tobacco and drug use. ?Limiting alcohol use. ?Practicing safe sex. ?Taking low doses of aspirin every day. ?Taking vitamin and mineral supplements as recommended by your health care provider. ?What happens during an annual well check? ?The services and screenings done by your health care provider during your annual well check will depend on your age, overall health, lifestyle risk factors, and family history of disease. ?Counseling  ?Your health  care provider may ask you questions about your: ?Alcohol use. ?Tobacco use. ?Drug use. ?Emotional well-being. ?Home and relationship well-being. ?Sexual activity. ?Eating habits. ?History of falls. ?Memory and ability to understand (cognition). ?Work and work Statistician. ?Screening  ?You may have the following tests or measurements: ?Height, weight, and BMI. ?Blood pressure. ?Lipid and cholesterol levels. These may be checked every 5 years, or more frequently if you are over 74 years old. ?Skin check. ?Lung cancer screening. You may have this screening every year starting at age 33 if you have a 30-pack-year history of smoking and currently smoke or have quit within the past 15 years. ?Fecal occult blood test (FOBT) of the stool. You may have this test every year starting at age 54. ?Flexible sigmoidoscopy or colonoscopy. You may have a sigmoidoscopy every 5 years or a colonoscopy every 10 years starting at age 91. ?Prostate cancer screening. Recommendations will vary depending on your family history and other risks. ?Hepatitis C blood test. ?Hepatitis B blood test. ?Sexually transmitted disease (STD) testing. ?Diabetes screening. This is done by checking your blood sugar (glucose) after you have not eaten for a while (fasting). You may have this done every 1-3 years. ?Abdominal aortic aneurysm (AAA) screening. You may need this if you are a current or former smoker. ?Osteoporosis. You may be screened starting at age 35 if you are at high risk. ?Talk with your health care provider about your test results, treatment options, and if necessary, the need for more tests. ?Vaccines  ?Your health care provider may recommend certain vaccines, such as: ?Influenza vaccine. This is recommended every year. ?Tetanus, diphtheria, and acellular pertussis (Tdap, Td) vaccine. You may need a Td booster every 10 years. ?Zoster vaccine. You may need this after age 62. ?Pneumococcal 13-valent conjugate (  PCV13) vaccine. One dose is  recommended after age 47. ?Pneumococcal polysaccharide (PPSV23) vaccine. One dose is recommended after age 2. ?Talk to your health care provider about which screenings and vaccines you need and how often you need them. ?This information is not intended to replace advice given to you by your health care provider. Make sure you discuss any questions you have with your health care provider. ?Document Released: 03/22/2015 Document Revised: 11/13/2015 Document Reviewed: 12/25/2014 ?Elsevier Interactive Patient Education ? 2017 [place]. ? ?Fall Prevention in the Home ?Falls can cause injuries. They can happen to people of all ages. There are many things you can do to make your home safe and to help prevent falls. ?What can I do on the outside of my home? ?Regularly fix the edges of walkways and driveways and fix any cracks. ?Remove anything that might make you trip as you walk through a door, such as a raised step or threshold. ?Trim any bushes or trees on the path to your home. ?Use bright outdoor lighting. ?Clear any walking paths of anything that might make someone trip, such as rocks or tools. ?Regularly check to see if handrails are loose or broken. Make sure that both sides of any steps have handrails. ?Any raised decks and porches should have guardrails on the edges. ?Have any leaves, snow, or ice cleared regularly. ?Use sand or salt on walking paths during winter. ?Clean up any spills in your garage right away. This includes oil or grease spills. ?What can I do in the bathroom? ?Use night lights. ?Install grab bars by the toilet and in the tub and shower. Do not use towel bars as grab bars. ?Use non-skid mats or decals in the tub or shower. ?If you need to sit down in the shower, use a plastic, non-slip stool. ?Keep the floor dry. Clean up any water that spills on the floor as soon as it happens. ?Remove soap buildup in the tub or shower regularly. ?Attach bath mats securely with double-sided non-slip rug  tape. ?Do not have throw rugs and other things on the floor that can make you trip. ?What can I do in the bedroom? ?Use night lights. ?Make sure that you have a light by your bed that is easy to reach. ?Do not use any sheets or blankets that are too big for your bed. They should not hang down onto the floor. ?Have a firm chair that has side arms. You can use this for support while you get dressed. ?Do not have throw rugs and other things on the floor that can make you trip. ?What can I do in the kitchen? ?Clean up any spills right away. ?Avoid walking on wet floors. ?Keep items that you use a lot in easy-to-reach places. ?If you need to reach something above you, use a strong step stool that has a grab bar. ?Keep electrical cords out of the way. ?Do not use floor polish or wax that makes floors slippery. If you must use wax, use non-skid floor wax. ?Do not have throw rugs and other things on the floor that can make you trip. ?What can I do with my stairs? ?Do not leave any items on the stairs. ?Make sure that there are handrails on both sides of the stairs and use them. Fix handrails that are broken or loose. Make sure that handrails are as long as the stairways. ?Check any carpeting to make sure that it is firmly attached to the stairs. Fix any carpet that is  loose or worn. ?Avoid having throw rugs at the top or bottom of the stairs. If you do have throw rugs, attach them to the floor with carpet tape. ?Make sure that you have a light switch at the top of the stairs and the bottom of the stairs. If you do not have them, ask someone to add them for you. ?What else can I do to help prevent falls? ?Wear shoes that: ?Do not have high heels. ?Have rubber bottoms. ?Are comfortable and fit you well. ?Are closed at the toe. Do not wear sandals. ?If you use a stepladder: ?Make sure that it is fully opened. Do not climb a closed stepladder. ?Make sure that both sides of the stepladder are locked into place. ?Ask someone to  hold it for you, if possible. ?Clearly mark and make sure that you can see: ?Any grab bars or handrails. ?First and last steps. ?Where the edge of each step is. ?Use tools that help you move around (mobility

## 2021-05-30 NOTE — Progress Notes (Signed)
?This visit occurred during the SARS-CoV-2 public health emergency.  Safety protocols were in place, including screening questions prior to the visit, additional usage of staff PPE, and extensive cleaning of exam room while observing appropriate contact time as indicated for disinfecting solutions. ? ?Subjective:  ? Terry Hays is a 71 y.o. male who presents for Medicare Annual/Subsequent preventive examination. ? ?Review of Systems    ? ?Cardiac Risk Factors include: advanced age (>37mn, >>41women);dyslipidemia;hypertension;male gender ? ?   ?Objective:  ?  ?Today's Vitals  ? 05/30/21 0823 05/30/21 0844  ?BP: 140/70 134/70  ?Pulse: 80   ?Temp: (!) 97.5 ?F (36.4 ?C)   ?TempSrc: Oral   ?SpO2: 96%   ?Weight: 188 lb (85.3 kg)   ?Height: 5' 7.5" (1.715 m)   ? ?Body mass index is 29.01 kg/m?. ? ? ?  05/30/2021  ?  8:34 AM 05/28/2020  ?  2:15 PM 04/04/2018  ?  9:06 AM 03/17/2018  ? 10:11 AM 12/19/2015  ?  9:33 AM 11/25/2015  ?  9:26 AM 11/06/2015  ? 11:45 AM  ?Advanced Directives  ?Does Patient Have a Medical Advance Directive? Yes Yes  Yes No No No  ?Type of [place] Living will;Out of facility DNR (pink MOST or yellow form) Living will [place]Living will     ?Does patient want to make changes to medical advance directive?  No - Patient declined No - Patient declined No - Patient declined     ?Copy of HSloanin Chart? No - copy requested   No - copy requested     ?Would patient like information on creating a medical advance directive?     No - patient declined information No - patient declined information No - patient declined information  ? ? ?Current Medications (verified) ?Outpatient Encounter Medications as of 05/30/2021  ?Medication Sig  ? ALPRAZolam (XANAX) 0.25 MG tablet Take 1 tablet (0.25 mg total) by mouth at bedtime as needed for anxiety (FOR FLYING).  ? amLODipine (NORVASC) 5 MG tablet TAKE 1 TABLET BY MOUTH EVERY  DAY  ? aspirin EC 81 MG tablet Take 1 tablet (81 mg total) by mouth daily.  ? atorvastatin (LIPITOR) 40 MG tablet TAKE 1 TABLET BY MOUTH EVERY DAY  ? hydrochlorothiazide (HYDRODIURIL) 25 MG tablet Take 1 tablet (25 mg total) by mouth 4 (four) times a week.  ? losartan (COZAAR) 100 MG tablet Take 1 tablet (100 mg total) by mouth daily.  ? magnesium gluconate (MAGONATE) 500 MG tablet Take 500 mg by mouth daily.  ? potassium chloride (KLOR-CON) 10 MEQ tablet TAKE 1 TAB DAILY. PLEASE KEEP UPCOMING APPT IN JANUARY 2023 WITH DR. NAcie FredricksonBEFORE ANYMORE REFILL  ? Pseudoeph-Doxylamine-DM-APAP (NYQUIL PO) Take 30 mLs by mouth as needed.  ? Pseudoephedrine-APAP-DM (DAYQUIL PO) Take 30 mLs by mouth as needed.  ? TURMERIC PO Take 1,000 mg by mouth daily.  ? VYVANSE 10 MG CHEW Chew 1 tablet by mouth as needed.  ? VYVANSE 50 MG CHEW Chew 1 tablet by mouth every morning.  ? clarithromycin (BIAXIN) 500 MG tablet Take 1 tablet (500 mg total) by mouth 2 (two) times daily. (Patient not taking: Reported on 03/12/2021)  ? predniSONE (DELTASONE) 20 MG tablet Take 1 tablet (20 mg total) by mouth daily with breakfast. (Patient not taking: Reported on 03/12/2021)  ? ?No facility-administered encounter medications on file as of 05/30/2021.  ? ? ?Allergies (verified) ?Tramadol  ? ?History: ?  Past Medical History:  ?Diagnosis Date  ? Abnormal ECG   ? Allergy   ? Anxiety   ? FLYING  ? Diverticulosis   ? Dyslipidemia   ? History of prostate cancer   ? Hyperlipidemia   ? Hypertension   ? LVH (left ventricular hypertrophy)   ? Pre-diabetes   ? ?Past Surgical History:  ?Procedure Laterality Date  ? COLONOSCOPY  2004 2011  ? MAGOD  ? EYE SURGERY  2001  ? CATOACTS BOTH  ? [place]  ? R INGHINAL  ? INGUINAL HERNIA REPAIR Left 04/07/2018  ? Procedure: OPERN LEFT INGUINAL HERNIA REPAIR WITH MESH ERAS PATHWAY;  Surgeon: Greer Pickerel, MD;  Location: [facility];  Service: General;  Laterality: Left;  ? PENILE PROSTHESIS IMPLANT  2011  ? DAHLSTEDT  ? PROSTATE  SURGERY  2003  ? PROSTRATECTOMY  ? ?Family History  ?Problem Relation Age of Onset  ? Heart attack Other   ? Hyperlipidemia Other   ? Cancer Other   ? ?Social History  ? ?Socioeconomic History  ? Marital status: Married  ?  Spouse name: Not on file  ? Number of children: Not on file  ? Years of education: Not on file  ? Highest education level: Not on file  ?Occupational History  ? Not on file  ?Tobacco Use  ? Smoking status: Former  ?  Types: Cigarettes  ?  Quit date: 03/09/1976  ?  Years since quitting: 45.2  ? Smokeless tobacco: Never  ?Vaping Use  ? Vaping Use: Never used  ?Substance and Sexual Activity  ? Alcohol use: Yes  ?  Alcohol/week: 1.0 standard drink  ?  Types: 1 Glasses of wine per week  ?  Comment: scotch  ? Drug use: No  ? Sexual activity: Not on file  ?Other Topics Concern  ? Not on file  ?Social History Narrative  ? Not on file  ? ?Social Determinants of Health  ? ?Financial Resource Strain: Low Risk   ? Difficulty of Paying Living Expenses: Not hard at all  ?Food Insecurity: No Food Insecurity  ? Worried About Charity fundraiser in the Last Year: Never true  ? Ran Out of Food in the Last Year: Never true  ?Transportation Needs: No Transportation Needs  ? Lack of Transportation (Medical): No  ? Lack of Transportation (Non-Medical): No  ?Physical Activity: Sufficiently Active  ? Days of Exercise per Week: 7 days  ? Minutes of Exercise per Session: 90 min  ?Stress: No Stress Concern Present  ? Feeling of Stress : Only a little  ?Social Connections: Not on file  ? ? ?Tobacco Counseling ?Counseling given: Not Answered ? ? ?Clinical Intake: ? ?Pre-visit preparation completed: Yes ? ?Pain : No/denies pain ? ?  ? ?Nutritional Status: BMI 25 -29 Overweight ?Nutritional Risks: None ?Diabetes: No ? ?How often do you need to have someone help you when you read instructions, pamphlets, or other written materials from your doctor or pharmacy?: 1 - Never ?What is the last grade level you completed in school?:  PhD ? ?Diabetic? no ? ?Interpreter Needed?: No ? ?Information entered by :: NAllen LPN ? ? ?Activities of Daily Living ? ?  05/30/2021  ?  8:35 AM 05/27/2021  ?  9:51 AM  ?In your present state of health, do you have any difficulty performing the following activities:  ?Hearing? 0 0  ?Vision? 0 0  ?Difficulty concentrating or making decisions? 0 0  ?Walking or climbing stairs?  0 0  ?Dressing or bathing? 0 0  ?Doing errands, shopping? 0 0  ?Preparing Food and eating ? N N  ?Using the Toilet? N N  ?In the past six months, have you accidently leaked urine? Y Y  ?Do you have problems with loss of bowel control? N N  ?Managing your Medications? N N  ?Managing your Finances? N N  ?Housekeeping or managing your Housekeeping? N N  ? ? ?Patient Care Team: ?Denita Lung, MD as PCP - General (Family Medicine) ?Nahser, Wonda Cheng, MD as PCP - Cardiology (Cardiology) ? ?Indicate any recent Medical Services you may have received from other than [facility] providers in the past year (date may be approximate). ? ?   ?Assessment:  ? This is a routine wellness examination for [place]. ? ?Hearing/Vision screen ?Vision Screening - Comments:: Regular eye exams, Dr. Delman Cheadle, [place] ? ?Dietary issues and exercise activities discussed: ?Current Exercise Habits: Home exercise routine, Type of exercise: stretching;walking, Time (Minutes): > 60, Frequency (Times/Week): 7, Weekly Exercise (Minutes/Week): 0 ? ? Goals Addressed   ? ?  ?  ?  ?  ? This Visit's Progress  ?  Patient Stated     ?  05/30/2021, wants to live til tomorrow ?  ? ?  ? ?Depression Screen ? ?  05/30/2021  ?  8:35 AM 05/28/2020  ?  2:16 PM 05/15/2019  ? 10:11 AM 03/17/2018  ?  9:37 AM 11/02/2016  ? 10:29 AM 12/19/2015  ?  9:34 AM 11/25/2015  ?  9:28 AM  ?PHQ 2/9 Scores  ?PHQ - 2 Score 0 0 0 0 0 0 0  ?Exception Documentation       Other- indicate reason in comment box  ?  ?Fall Risk ? ?  05/30/2021  ?  8:35 AM 05/27/2021  ?  9:51 AM 05/28/2020  ?  2:16 PM 05/15/2019  ? 10:11 AM 03/17/2018  ?   9:37 AM  ?Fall Risk   ?Falls in the past year? 0 0 0 0 0  ?Number falls in past yr:  0 0    ?Injury with Fall?  0 0    ?Risk for fall due to : Medication side effect  No Fall Risks    ?Follow up Falls eva

## 2021-06-03 ENCOUNTER — Ambulatory Visit (INDEPENDENT_AMBULATORY_CARE_PROVIDER_SITE_OTHER): Payer: Medicare PPO | Admitting: Family Medicine

## 2021-06-03 ENCOUNTER — Encounter: Payer: Self-pay | Admitting: Family Medicine

## 2021-06-03 VITALS — BP 122/68 | HR 86 | Temp 97.3°F | Ht 67.5 in | Wt 186.8 lb

## 2021-06-03 DIAGNOSIS — Z0000 Encounter for general adult medical examination without abnormal findings: Secondary | ICD-10-CM

## 2021-06-03 DIAGNOSIS — E785 Hyperlipidemia, unspecified: Secondary | ICD-10-CM | POA: Diagnosis not present

## 2021-06-03 DIAGNOSIS — Z8546 Personal history of malignant neoplasm of prostate: Secondary | ICD-10-CM | POA: Diagnosis not present

## 2021-06-03 DIAGNOSIS — L821 Other seborrheic keratosis: Secondary | ICD-10-CM

## 2021-06-03 DIAGNOSIS — R7301 Impaired fasting glucose: Secondary | ICD-10-CM | POA: Diagnosis not present

## 2021-06-03 DIAGNOSIS — M4807 Spinal stenosis, lumbosacral region: Secondary | ICD-10-CM

## 2021-06-03 DIAGNOSIS — J309 Allergic rhinitis, unspecified: Secondary | ICD-10-CM | POA: Diagnosis not present

## 2021-06-03 DIAGNOSIS — F40243 Fear of flying: Secondary | ICD-10-CM

## 2021-06-03 DIAGNOSIS — I10 Essential (primary) hypertension: Secondary | ICD-10-CM

## 2021-06-03 DIAGNOSIS — Z8249 Family history of ischemic heart disease and other diseases of the circulatory system: Secondary | ICD-10-CM | POA: Diagnosis not present

## 2021-06-03 DIAGNOSIS — M1990 Unspecified osteoarthritis, unspecified site: Secondary | ICD-10-CM | POA: Diagnosis not present

## 2021-06-03 DIAGNOSIS — D1801 Hemangioma of skin and subcutaneous tissue: Secondary | ICD-10-CM

## 2021-06-03 DIAGNOSIS — F901 Attention-deficit hyperactivity disorder, predominantly hyperactive type: Secondary | ICD-10-CM | POA: Insufficient documentation

## 2021-06-03 MED ORDER — AMLODIPINE BESYLATE 5 MG PO TABS: 5.0000 mg | ORAL_TABLET | Freq: Every day | ORAL | 3 refills | Status: DC

## 2021-06-03 MED ORDER — LOSARTAN POTASSIUM-HCTZ 50-12.5 MG PO TABS: 1.0000 | ORAL_TABLET | Freq: Every day | ORAL | 3 refills | Status: DC

## 2021-06-03 MED ORDER — ALPRAZOLAM 0.25 MG PO TABS: 0.2500 mg | ORAL_TABLET | Freq: Every evening | ORAL | 1 refills | Status: DC | PRN

## 2021-06-03 NOTE — Progress Notes (Signed)
? ?  Subjective:  ? ? Patient ID: Terry Hays, male    DOB: [birth year], 71 y.o.   MRN: [id number] ? ?HPI ?He is here for complete examination.  He has hypertension and is now on losartan and HCTZ.  He is having no difficulty with that medication.  Does have a family history of heart disease and presently is on Lipitor and having no aches or pains.  His allergies seem to be under good control using Nasonex.  He has had difficulty with lumbar spinal stenosis and has gone through physical therapy.  He is doing quite nicely with that and has started playing basketball again.  He also has a history of ADHD and presently is being taken care of by Dr. Haroldine Laws.  She has him on Vyvanse 60 and supplementing with 10 mg on an as-needed basis.  This seems to be helping him stay focused.  He will need another Cologuard in 2024.  He also needs second Shingrix shots.  He is getting ready to do more flying and therefore does need a refill on Xanax.  Otherwise his family and social history as well as health maintenance and immunizations was reviewed.  He is enjoying his retirement and helping take care of his grandchildren. ? ? ?Review of Systems  ?All other systems reviewed and are negative. ? ?   ?Objective:  ? Physical Exam ?Alert and in no distress. Tympanic membranes and canals are normal. Pharyngeal area is normal. Neck is supple without adenopathy or thyromegaly. Cardiac exam shows a regular sinus rhythm without murmurs or gallops. Lungs are clear to auscultation.  Abdominal exam shows normal bowel sounds without masses or tenderness.  Exam of his back did show evidence of cherry angiomas as well as seborrheic keratoses. ? ? ? ? ?   ?Assessment & Plan:  ?Routine general medical examination at a health care facility - Plan: CBC with Differential/Platelet, Comprehensive metabolic panel, Lipid panel ? ?Hypertension, unspecified type - Plan: CBC with Differential/Platelet, Comprehensive metabolic panel,  losartan-hydrochlorothiazide (HYZAAR) 50-12.5 MG tablet ? ?Dyslipidemia - Plan: Lipid panel ? ?Arthritis ? ?Fear of flying - Plan: ALPRAZolam (XANAX) 0.25 MG tablet ? ?Spinal stenosis of lumbosacral region ? ?History of prostate cancer - 2003 ? ?Allergic rhinitis, unspecified seasonality, unspecified trigger ? ?Family history of heart disease in male family member before age 13 ? ?Attention deficit hyperactivity disorder (ADHD), predominantly hyperactive type ? ?Cherry angioma ? ?Seborrheic keratosis ?I reassured him that the lesions on his abdomen and back are benign. ?We will wait to see what the lipid panel shows and possibly readjust his statin. ?Switch him back to Hyzaar to cut back on his pill load. ?Encouraged him to continue with his physical activity as well as physical therapy. ? ?

## 2021-06-03 NOTE — Patient Instructions (Signed)
?  [name] , ?Thank you for taking time to come for your Medicare Wellness Visit. I appreciate your ongoing commitment to your health goals. Please review the following plan we discussed and let me know if I can assist you in the future.  ? ?These are the goals we discussed: ? Goals   ? ?  Patient Stated   ?  05/30/2021, wants to live til tomorrow ?  ? ?  ?  ?This is a list of the screening recommended for you and due dates:  ?Health Maintenance  ?Topic Date Due  ? Colon Cancer Screening  06/21/2019  ? COVID-19 Vaccine (3 - Moderna risk series) 06/14/2021*  ? Zoster (Shingles) Vaccine (1 of 2) 08/30/2021*  ? Pneumonia Vaccine (3) 05/31/2022*  ? Tetanus Vaccine  12/22/2023  ? Flu Shot  Completed  ? Hepatitis C Screening: USPSTF Recommendation to screen - Ages 92-79 yo.  Completed  ? HPV Vaccine  Aged Out  ?*Topic was postponed. The date shown is not the original due date.  ?  ?

## 2021-06-03 NOTE — Progress Notes (Deleted)
? ?Subjective:  ?Terry Hays is a 71 y.o. male who presents for annual wellness visit ,CPEand follow-up on chronic medical conditions.  He has the following concerns: ? ? ?Immunizations and Health Maintenance ?Immunization History  ?Administered Date(s) Administered  ? Fluad Quad(high Dose 65+) 12/02/2018  ? Influenza, High Dose Seasonal PF 12/21/2013, 11/02/2016, 11/29/2017  ? Influenza, Seasonal, Injecte, Preservative Fre 03/10/2012  ? Influenza,inj,Quad PF,6+ Mos 12/23/2012  ? Influenza-Unspecified 12/17/2020  ? Moderna Covid-19 Vaccine Bivalent Booster 61yr & up 12/17/2020  ? Moderna Sars-Covid-2 Vaccination 03/27/2019, 04/24/2019  ? Pneumococcal Conjugate-13 12/21/2013  ? Pneumococcal Polysaccharide-23 10/29/1997  ? Tdap 09/06/2001, 12/21/2013  ? Zoster, Live 03/09/2008  ? ?Health Maintenance Due  ?Topic Date Due  ? COLONOSCOPY (Pts [phone] coverage will need to be confirmed)  06/21/2019  ? ? ?Last colonoscopy: ?Last PSA: ?Dentist: ?Ophtho: ?Exercise: ? ?Other doctors caring for patient include: ? ?Advanced Directives: ?  ? ?Depression screen:  See questionnaire below.   ?  ? ?  05/30/2021  ?  8:35 AM 05/28/2020  ?  2:16 PM 05/15/2019  ? 10:11 AM 03/17/2018  ?  9:37 AM 11/02/2016  ? 10:29 AM  ?Depression screen PHQ 2/9  ?Decreased Interest 0 0 0 0 0  ?Down, Depressed, Hopeless 0 0 0 0 0  ?PHQ - 2 Score 0 0 0 0 0  ? ? ?Fall Screen: See Questionaire below. ?  ? ?  05/30/2021  ?  8:35 AM 05/27/2021  ?  9:51 AM 05/28/2020  ?  2:16 PM 05/15/2019  ? 10:11 AM 03/17/2018  ?  9:37 AM  ?Fall Risk   ?Falls in the past year? 0 0 0 0 0  ?Number falls in past yr:  0 0    ?Injury with Fall?  0 0    ?Risk for fall due to : Medication side effect  No Fall Risks    ?Follow up Falls evaluation completed;Education provided;Falls prevention discussed  Falls evaluation completed    ? ? ?ADL screen:  See questionnaire below.  ?Functional Status Survey: ?  ? ? ?Review of Systems ? ?Constitutional: -, -unexpected weight change,  -anorexia, -fatigue ?Allergy: -sneezing, -itching, -congestion ?Dermatology: denies changing moles, rash, lumps ?ENT: -runny nose, -ear pain, -sore throat,  ?Cardiology:  -chest pain, -palpitations, -orthopnea, ?Respiratory: -cough, -shortness of breath, -dyspnea on exertion, -wheezing,  ?Gastroenterology: -abdominal pain, -nausea, -vomiting, -diarrhea, -constipation, -dysphagia ?Hematology: -bleeding or bruising problems ?Musculoskeletal: -arthralgias, -myalgias, -joint swelling, -back pain, - ?Ophthalmology: -vision changes,  ?Urology: -dysuria, -difficulty urinating,  -urinary frequency, -urgency, incontinence ?Neurology: -, -numbness, , -memory loss, -falls, -dizziness ? ? ? ?PHYSICAL EXAM: ? ?There were no vitals taken for this visit. ? ?General Appearance: Alert, cooperative, no distress, appears stated age ?Head: Normocephalic, without obvious abnormality, atraumatic ?Eyes: PERRL, conjunctiva/corneas clear, EOM's intact, fundi benign ?Ears: Normal TM's and external ear canals ?Nose: Nares normal, mucosa normal, no drainage or sinus   tenderness ?Throat: Lips, mucosa, and tongue normal; teeth and gums normal ?Neck: Supple, no lymphadenopathy, thyroid:no enlargement/tenderness/nodules; no carotid bruit or JVD ?Lungs: Clear to auscultation bilaterally without wheezes, rales or ronchi; respirations unlabored ?Heart: Regular rate and rhythm, S1 and S2 normal, no murmur, rub or gallop ?Abdomen: Soft, non-tender, nondistended, normoactive bowel sounds, no masses, no hepatosplenomegaly ?Extremities: No clubbing, cyanosis or edema ?Pulses: 2+ and symmetric all extremities ?Skin: Skin color, texture, turgor normal, no rashes or lesions ?Lymph nodes: Cervical, supraclavicular, and axillary nodes normal ?Neurologic: CNII-XII intact, normal strength, sensation and gait; reflexes  2+ and symmetric throughout   ?Psych: Normal mood, affect, hygiene and grooming ? ?ASSESSMENT/PLAN: ? ? ? ?Discussed PSA screening  (risks/benefits), recommended at least 30 minutes of aerobic activity at least 5 days/week; proper sunscreen use reviewed; healthy diet and alcohol recommendations (less than or equal to 2 drinks/day) reviewed; regular seatbelt use; changing batteries in smoke detectors. Immunization recommendations discussed.  Colonoscopy recommendations reviewed. ? ? ?Medicare Attestation ?I have personally reviewed: ?The patient's medical and social history ?Their use of alcohol, tobacco or illicit drugs ?Their current medications and supplements ?The patient's functional ability including ADLs,fall risks, home safety risks, cognitive, and hearing and visual impairment ?Diet and physical activities ?Evidence for depression or mood disorders ? ?The patient's weight, height, and BMI have been recorded in the chart.  I have made referrals, counseling, and provided education to the patient based on review of the above and I have provided the patient with a written personalized care plan for preventive services.   ? ? ?Jill Alexanders, MD   06/03/2021  ? ? ? ?

## 2021-06-04 ENCOUNTER — Encounter: Payer: Self-pay | Admitting: Family Medicine

## 2021-06-04 LAB — CBC WITH DIFFERENTIAL/PLATELET
Basophils Absolute: 0 10*3/uL (ref 0.0–0.2)
Basos: 1 %
EOS (ABSOLUTE): 0.2 10*3/uL (ref 0.0–0.4)
Eos: 2 %
Hematocrit: 46.5 % (ref 37.5–51.0)
Hemoglobin: 15.7 g/dL (ref 13.0–17.7)
Immature Grans (Abs): 0 10*3/uL (ref 0.0–0.1)
Immature Granulocytes: 0 %
Lymphocytes Absolute: 1.5 10*3/uL (ref 0.7–3.1)
Lymphs: 23 %
MCH: 30.5 pg (ref 26.6–33.0)
MCHC: 33.8 g/dL (ref 31.5–35.7)
MCV: 90 fL (ref 79–97)
Monocytes Absolute: 0.6 10*3/uL (ref 0.1–0.9)
Monocytes: 10 %
Neutrophils Absolute: 4.3 10*3/uL (ref 1.4–7.0)
Neutrophils: 64 %
Platelets: 242 10*3/uL (ref 150–450)
RBC: 5.15 x10E6/uL (ref 4.14–5.80)
RDW: 13.8 % (ref 11.6–15.4)
WBC: 6.6 10*3/uL (ref 3.4–10.8)

## 2021-06-04 LAB — COMPREHENSIVE METABOLIC PANEL
ALT: 27 IU/L (ref 0–44)
AST: 21 IU/L (ref 0–40)
Albumin/Globulin Ratio: 2.1 (ref 1.2–2.2)
Albumin: 4.8 g/dL — ABNORMAL HIGH (ref 3.7–4.7)
Alkaline Phosphatase: 74 IU/L (ref 44–121)
BUN/Creatinine Ratio: 14 (ref 10–24)
BUN: 15 mg/dL (ref 8–27)
Bilirubin Total: 1.1 mg/dL (ref 0.0–1.2)
CO2: 22 mmol/L (ref 20–29)
Calcium: 9.7 mg/dL (ref 8.6–10.2)
Chloride: 100 mmol/L (ref 96–106)
Creatinine, Ser: 1.06 mg/dL (ref 0.76–1.27)
Globulin, Total: 2.3 g/dL (ref 1.5–4.5)
Glucose: 130 mg/dL — ABNORMAL HIGH (ref 70–99)
Potassium: 4.2 mmol/L (ref 3.5–5.2)
Sodium: 139 mmol/L (ref 134–144)
Total Protein: 7.1 g/dL (ref 6.0–8.5)
eGFR: 74 mL/min/{1.73_m2} (ref >59)

## 2021-06-04 LAB — LIPID PANEL
Chol/HDL Ratio: 2.9 ratio (ref 0.0–5.0)
Cholesterol, Total: 176 mg/dL (ref 100–199)
HDL: 61 mg/dL (ref >39)
LDL Chol Calc (NIH): 88 mg/dL (ref 0–99)
Triglycerides: 159 mg/dL — ABNORMAL HIGH (ref 0–149)
VLDL Cholesterol Cal: 27 mg/dL (ref 5–40)

## 2021-06-05 ENCOUNTER — Encounter: Payer: Self-pay | Admitting: Family Medicine

## 2021-06-10 ENCOUNTER — Ambulatory Visit: Payer: Medicare PPO | Admitting: Family Medicine

## 2021-06-19 LAB — HGB A1C W/O EAG: Hgb A1c MFr Bld: 6.2 % — ABNORMAL HIGH (ref 4.8–5.6)

## 2021-06-19 LAB — SPECIMEN STATUS REPORT

## 2021-09-11 ENCOUNTER — Other Ambulatory Visit: Payer: Self-pay | Admitting: Family Medicine

## 2021-09-11 DIAGNOSIS — E785 Hyperlipidemia, unspecified: Secondary | ICD-10-CM

## 2021-10-24 DIAGNOSIS — B9689 Other specified bacterial agents as the cause of diseases classified elsewhere: Secondary | ICD-10-CM | POA: Diagnosis not present

## 2021-10-24 DIAGNOSIS — J019 Acute sinusitis, unspecified: Secondary | ICD-10-CM | POA: Diagnosis not present

## 2021-11-12 ENCOUNTER — Encounter: Payer: Self-pay | Admitting: Internal Medicine

## 2021-11-20 ENCOUNTER — Encounter: Payer: Self-pay | Admitting: Family Medicine

## 2021-11-20 DIAGNOSIS — F40243 Fear of flying: Secondary | ICD-10-CM

## 2021-11-20 MED ORDER — ALPRAZOLAM 0.25 MG PO TABS: 0.2500 mg | ORAL_TABLET | Freq: Every evening | ORAL | 1 refills | Status: DC | PRN

## 2021-12-16 ENCOUNTER — Encounter: Payer: Self-pay | Admitting: Internal Medicine

## 2021-12-29 ENCOUNTER — Encounter: Payer: Self-pay | Admitting: Internal Medicine

## 2022-02-06 DIAGNOSIS — J069 Acute upper respiratory infection, unspecified: Secondary | ICD-10-CM | POA: Diagnosis not present

## 2022-02-13 DIAGNOSIS — R0982 Postnasal drip: Secondary | ICD-10-CM | POA: Diagnosis not present

## 2022-02-13 DIAGNOSIS — R519 Headache, unspecified: Secondary | ICD-10-CM | POA: Diagnosis not present

## 2022-02-13 DIAGNOSIS — J029 Acute pharyngitis, unspecified: Secondary | ICD-10-CM | POA: Diagnosis not present

## 2022-02-13 DIAGNOSIS — R059 Cough, unspecified: Secondary | ICD-10-CM | POA: Diagnosis not present

## 2022-02-13 DIAGNOSIS — R0981 Nasal congestion: Secondary | ICD-10-CM | POA: Diagnosis not present

## 2022-02-13 DIAGNOSIS — B9689 Other specified bacterial agents as the cause of diseases classified elsewhere: Secondary | ICD-10-CM | POA: Diagnosis not present

## 2022-03-13 ENCOUNTER — Other Ambulatory Visit: Payer: Self-pay | Admitting: Family Medicine

## 2022-03-13 DIAGNOSIS — E785 Hyperlipidemia, unspecified: Secondary | ICD-10-CM

## 2022-03-13 DIAGNOSIS — C4442 Squamous cell carcinoma of skin of scalp and neck: Secondary | ICD-10-CM | POA: Diagnosis not present

## 2022-03-20 ENCOUNTER — Encounter: Payer: Self-pay | Admitting: Family Medicine

## 2022-03-26 ENCOUNTER — Ambulatory Visit: Payer: Medicare PPO | Admitting: Cardiovascular Disease

## 2022-03-27 ENCOUNTER — Other Ambulatory Visit: Payer: Self-pay | Admitting: Family Medicine

## 2022-04-01 ENCOUNTER — Encounter: Payer: Self-pay | Admitting: Cardiovascular Disease

## 2022-04-01 NOTE — Progress Notes (Unsigned)
Terry Hays Date of Birth  06-24-1950   [address]     [place], [place]  [address] [phone]  Fax  [phone]  Problem List 1. Hypertension 2. LVH 3. Hyperlipidemia 4.   Terry Hays is a middle-aged gentleman with a history of hypertension, left ventricular hypertrophy, Hypercholesterolemia and an abnormal EKG.  He's done very well since I last saw him one year ago. He still plays basketball on a regular basis. He's been saying little bit better hydrated and has noted that he is not having nearly as much dizziness. His energy levels have also been better.  June 10, 2012  Terry Hays is doing well.  He has had some orthopedic issues which has kept him out of the basketball court.  Jul 19, 2013:  Terry Hays is doing well.  He checks his blood pressure on regular basis at home and his readings are in the normal range. If it is a little high today in the office.  He remains very busy. He is doing a lot developing a [employer] . 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.  Oct. 5, 2016: Terry Hays is doing well. Still very active.  Plays basketball regularly . Still teaching at [employer].   No CP or dyspnea   Nov. 21, 2017:  Doing well from a cardiac standpoint.   Is injurred - bad back .   Has not been playing basketball  Does get some core exercises and stretches.  May 07, 2017:  Terry Hays is seen today for follow-up of his hypertension and hyperlipidemia. Has 2 new grandchildren ,  Spending lots of time with grandchildren .  Still nursing his back strain .   Lipids in Aug. Look good   September 20, 2018:  Terry Hays is seen today for follow-up of his hypertension and hyperlipidemia. Has retired ,  Enjoying his grandchildren.   Misses his basketball and water aerobics  His BP has increased .  We doubled his Losartan and BP is now back to normal  He was eating too much salt ( he used to play competitive basket ball and would ordinarily sweat out the extra salt )   October 10, 2019:   Terry Hays is seen today for follow-up visit.  He has a history of hypertension and hyperlipidemia.  He has been very active for review.  He typically plays basketball and does water aerobics.  Not playing basketball now due to spinal stenosos Doing lots of core exercises - has greatly improved his back pain  Does lots of stretches in the am  No CP , no dyspnea   Nov. 8, 2021: Terry Hays is seen today for follow up of his HTN and HLD  Has developed spinal stenosis . Stretches and does plank exercises every day . BP is better after starting amlodipne ,  Losartan   Jan. 4, 2023 Terry Hays is seen today for follow up of his HTN and HLD  Has developed spinal stenosis  His EKG chronically showed normal sinus rhythm.  Chronic T wave inversions in leads V3 through V4 through V6.  Otherwise his EKG is normal.  Back is better .  Hard work getting through his spinal stenosis  Is back playing basket ball.  BP hs been normal  He stopped taking the HCTZ ( due to incontinence issues)   April 02, 2022: Terry Hays is seen today for follow-up of his hypertension and hyperlipidemia.  He has developed some issues with spinal stenosis.  Has retired  from basketball for now ( busy with grandkids )  Walks every day  Golf on occasion  Has been working on his core strength  Has a Physiological scientist     Current Outpatient Medications on File Prior to Visit  Medication Sig Dispense Refill   ALPRAZolam (XANAX) 0.25 MG tablet Take 1 tablet (0.25 mg total) by mouth at bedtime as needed for anxiety (FOR FLYING). 30 tablet 1   amLODipine (NORVASC) 5 MG tablet Take 1 tablet (5 mg total) by mouth daily. 90 tablet 3   aspirin EC 81 MG tablet Take 1 tablet (81 mg total) by mouth daily. 90 tablet 3   atorvastatin (LIPITOR) 40 MG tablet TAKE 1 TABLET BY MOUTH EVERY DAY 90 tablet 0   losartan-hydrochlorothiazide (HYZAAR) 50-12.5 MG tablet Take 1 tablet by mouth daily. 90 tablet 3   magnesium gluconate (MAGONATE) 500 MG tablet Take 500 mg by  mouth daily.     potassium chloride (KLOR-CON) 10 MEQ tablet TAKE 1 TAB DAILY. PLEASE KEEP UPCOMING APPT IN JANUARY 2023 WITH DR. Acie Fredrickson BEFORE ANYMORE REFILL 90 tablet 3   TURMERIC PO Take 1,000 mg by mouth daily.     VYVANSE 10 MG CHEW Chew 1 tablet by mouth as needed.     VYVANSE 60 MG CHEW Chew by mouth.     No current facility-administered medications on file prior to visit.    Allergies  Allergen Reactions   Tramadol Other (See Comments)    "hallucinations"    Past Medical History:  Diagnosis Date   Abnormal ECG    Allergy    Anxiety    FLYING   Diverticulosis    Dyslipidemia    History of prostate cancer    Hyperlipidemia    Hypertension    LVH (left ventricular hypertrophy)    Pre-diabetes     Past Surgical History:  Procedure Laterality Date   COLONOSCOPY  2004 2011   [place] SURGERY  2001   CATOACTS BOTH   HERNIA REPAIR  1954   R INGHINAL   INGUINAL HERNIA REPAIR Left 04/07/2018   Procedure: OPERN LEFT INGUINAL HERNIA REPAIR WITH [place];  Surgeon: Greer Pickerel, MD;  Location: [facility];  Service: General;  Laterality: Left;   PENILE PROSTHESIS IMPLANT  2011   DAHLSTEDT   PROSTATE SURGERY  2003   PROSTRATECTOMY    Social History   Tobacco Use  Smoking Status Former   Types: Cigarettes   Quit date: 03/09/1976   Years since quitting: 46.0  Smokeless Tobacco Never    Social History   Substance and Sexual Activity  Alcohol Use Yes   Alcohol/week: 1.0 standard drink of alcohol   Types: 1 Glasses of wine per week   Comment: scotch    Family History  Problem Relation Age of Onset   Heart attack Other    Hyperlipidemia Other    Cancer Other     Reviw of Systems:  Reviewed in the HPI.  All other systems are negative.  Physical Exam: Blood pressure 126/72, pulse 79, height '5\' 8"'$  (1.727 m), weight 191 lb 3.2 oz (86.7 kg), SpO2 98 %.       GEN:  Well nourished, well developed in no acute distress HEENT: Normal NECK: No JVD; No  carotid bruits LYMPHATICS: No lymphadenopathy CARDIAC: RRR , no murmurs, rubs, gallops RESPIRATORY:  Clear to auscultation without rales, wheezing or rhonchi  ABDOMEN: Soft, non-tender, non-distended MUSCULOSKELETAL:  No edema; No deformity  SKIN: Warm  and dry NEUROLOGIC:  Alert and oriented x 3   ECG: .  April 02, 2022: Normal sinus rhythm.  Occasional premature ventricular contractions.  Nonspecific ST and T wave abnormalities in inferior lateral leads.  No changes from previous EKG ( except for the PVC) .    Assessment / Plan:   1. Hypertension-      blood pressure is well-controlled.  Continue current medications.  Remains very active and healthy.  He works out regularly.   3. Hyperlipidemia-  - lipids have been well controlled.  Will see him in a year       Mertie Moores, MD  04/02/2022 10:13 AM    [address] [place],  [place], [place]  [address] Pager [phone] Phone: [phone]; Fax: [phone]

## 2022-04-02 ENCOUNTER — Encounter: Payer: Self-pay | Admitting: Cardiovascular Disease

## 2022-04-02 ENCOUNTER — Ambulatory Visit: Payer: Medicare PPO | Attending: Cardiovascular Disease | Admitting: Cardiovascular Disease

## 2022-04-02 VITALS — BP 126/72 | HR 79 | Ht 68.0 in | Wt 191.2 lb

## 2022-04-02 DIAGNOSIS — I10 Essential (primary) hypertension: Secondary | ICD-10-CM

## 2022-04-02 NOTE — Patient Instructions (Signed)
Medication Instructions:  Your physician recommends that you continue on your current medications as directed. Please refer to the Current Medication list given to you today.  *If you need a refill on your cardiac medications before your next appointment, please call your pharmacy*  Lab Work: None  Testing/Procedures: None  Follow-Up: At [facility], you and your health needs are our priority.  As part of our continuing mission to provide you with exceptional heart care, we have created designated Provider Care Teams.  These Care Teams include your primary Cardiologist (physician) and Advanced Practice Providers (APPs -  Physician Assistants and Nurse Practitioners) who all work together to provide you with the care you need, when you need it.  Your next appointment:   1 year(s)  Provider:   Mertie Moores, MD

## 2022-04-24 DIAGNOSIS — Z85828 Personal history of other malignant neoplasm of skin: Secondary | ICD-10-CM | POA: Diagnosis not present

## 2022-04-24 DIAGNOSIS — D225 Melanocytic nevi of trunk: Secondary | ICD-10-CM | POA: Diagnosis not present

## 2022-04-24 DIAGNOSIS — Z1283 Encounter for screening for malignant neoplasm of skin: Secondary | ICD-10-CM | POA: Diagnosis not present

## 2022-04-24 DIAGNOSIS — Z08 Encounter for follow-up examination after completed treatment for malignant neoplasm: Secondary | ICD-10-CM | POA: Diagnosis not present

## 2022-04-24 DIAGNOSIS — X32XXXA Exposure to sunlight, initial encounter: Secondary | ICD-10-CM | POA: Diagnosis not present

## 2022-04-24 DIAGNOSIS — L570 Actinic keratosis: Secondary | ICD-10-CM | POA: Diagnosis not present

## 2022-05-15 DIAGNOSIS — L570 Actinic keratosis: Secondary | ICD-10-CM | POA: Diagnosis not present

## 2022-05-15 DIAGNOSIS — X32XXXD Exposure to sunlight, subsequent encounter: Secondary | ICD-10-CM | POA: Diagnosis not present

## 2022-06-02 ENCOUNTER — Telehealth: Payer: Self-pay | Admitting: Family Medicine

## 2022-06-02 NOTE — Telephone Encounter (Signed)
Contacted Terry Hays to schedule their annual wellness visit. Appointment made for 06/09/22.  Terry Hays AWV direct phone # [phone]

## 2022-06-05 ENCOUNTER — Other Ambulatory Visit: Payer: Self-pay | Admitting: Family Medicine

## 2022-06-05 DIAGNOSIS — I10 Essential (primary) hypertension: Secondary | ICD-10-CM

## 2022-06-08 ENCOUNTER — Ambulatory Visit: Payer: Medicare PPO | Admitting: Family Medicine

## 2022-06-10 ENCOUNTER — Ambulatory Visit: Payer: Medicare PPO | Admitting: Family Medicine

## 2022-06-18 ENCOUNTER — Other Ambulatory Visit: Payer: Self-pay | Admitting: Cardiovascular Disease

## 2022-06-18 ENCOUNTER — Other Ambulatory Visit: Payer: Self-pay | Admitting: Family Medicine

## 2022-06-18 MED ORDER — POTASSIUM CHLORIDE ER 10 MEQ PO TBCR: EXTENDED_RELEASE_TABLET | ORAL | 3 refills | Status: DC

## 2022-07-10 ENCOUNTER — Encounter: Payer: Self-pay | Admitting: Family Medicine

## 2022-07-10 ENCOUNTER — Other Ambulatory Visit: Payer: Self-pay | Admitting: Family Medicine

## 2022-07-10 DIAGNOSIS — F40243 Fear of flying: Secondary | ICD-10-CM

## 2022-07-10 DIAGNOSIS — E785 Hyperlipidemia, unspecified: Secondary | ICD-10-CM

## 2022-07-10 MED ORDER — ATORVASTATIN CALCIUM 40 MG PO TABS: 40.0000 mg | ORAL_TABLET | Freq: Every day | ORAL | 0 refills | Status: DC

## 2022-07-11 MED ORDER — ALPRAZOLAM 0.25 MG PO TABS: 0.2500 mg | ORAL_TABLET | Freq: Every evening | ORAL | 1 refills | Status: DC | PRN

## 2022-07-22 ENCOUNTER — Other Ambulatory Visit: Payer: Self-pay | Admitting: Family Medicine

## 2022-08-28 ENCOUNTER — Other Ambulatory Visit: Payer: Self-pay | Admitting: Family Medicine

## 2022-09-02 ENCOUNTER — Ambulatory Visit: Payer: Medicare PPO | Admitting: Family Medicine

## 2022-09-02 ENCOUNTER — Encounter: Payer: Self-pay | Admitting: Family Medicine

## 2022-09-02 VITALS — BP 112/62 | HR 69 | Temp 97.9°F | Ht 68.0 in | Wt 181.4 lb

## 2022-09-02 DIAGNOSIS — J309 Allergic rhinitis, unspecified: Secondary | ICD-10-CM

## 2022-09-02 DIAGNOSIS — I10 Essential (primary) hypertension: Secondary | ICD-10-CM | POA: Diagnosis not present

## 2022-09-02 DIAGNOSIS — E7849 Other hyperlipidemia: Secondary | ICD-10-CM | POA: Diagnosis not present

## 2022-09-02 DIAGNOSIS — Z8546 Personal history of malignant neoplasm of prostate: Secondary | ICD-10-CM

## 2022-09-02 DIAGNOSIS — R7303 Prediabetes: Secondary | ICD-10-CM | POA: Diagnosis not present

## 2022-09-02 DIAGNOSIS — E785 Hyperlipidemia, unspecified: Secondary | ICD-10-CM | POA: Diagnosis not present

## 2022-09-02 DIAGNOSIS — Z8249 Family history of ischemic heart disease and other diseases of the circulatory system: Secondary | ICD-10-CM | POA: Diagnosis not present

## 2022-09-02 DIAGNOSIS — F901 Attention-deficit hyperactivity disorder, predominantly hyperactive type: Secondary | ICD-10-CM

## 2022-09-02 DIAGNOSIS — I517 Cardiomegaly: Secondary | ICD-10-CM | POA: Diagnosis not present

## 2022-09-02 DIAGNOSIS — Z0000 Encounter for general adult medical examination without abnormal findings: Secondary | ICD-10-CM

## 2022-09-02 DIAGNOSIS — M5136 Other intervertebral disc degeneration, lumbar region: Secondary | ICD-10-CM

## 2022-09-02 DIAGNOSIS — M1990 Unspecified osteoarthritis, unspecified site: Secondary | ICD-10-CM

## 2022-09-02 LAB — HEMOGLOBIN A1C

## 2022-09-02 MED ORDER — ATORVASTATIN CALCIUM 40 MG PO TABS: 40.0000 mg | ORAL_TABLET | Freq: Every day | ORAL | 0 refills | Status: DC

## 2022-09-02 MED ORDER — LOSARTAN POTASSIUM 50 MG PO TABS
50.0000 mg | ORAL_TABLET | Freq: Every day | ORAL | 3 refills | Status: DC
Start: 2022-09-02 — End: 2023-06-28

## 2022-09-02 MED ORDER — AMLODIPINE BESYLATE 5 MG PO TABS: 5.0000 mg | ORAL_TABLET | Freq: Every day | ORAL | 3 refills | Status: DC

## 2022-09-02 NOTE — Progress Notes (Signed)
Terry Hays is a 72 y.o. male who presents for annual wellness visit,CPE and follow-up on chronic medical conditions.  He did stop taking his losartan/HCTZ and he had leftover of losartan and has been on that over the last several months.  He continues on amlodipine as well.  He is having no difficulty with atorvastatin.  He also has ADHD and is being cared for by Dr. Gala Romney and is doing quite nicely on Vyvanse.  He continues to exercise regularly which is helping with his low back pain.  He has not had recent skin surgery and skin cancer as well as AKs was noted.  He has follow-up with cardiology concerning his underlying heart disease history.  Otherwise his family and social history as well as health maintenance and immunizations was reviewed.   Immunizations and Health Maintenance Immunization History  Administered Date(s) Administered   Fluad Quad(high Dose 65+) 12/02/2018   Influenza, High Dose Seasonal PF 12/21/2013, 11/02/2016, 11/29/2017   Influenza, Seasonal, Injecte, Preservative Fre 03/10/2012   Influenza,inj,Quad PF,6+ Mos 12/23/2012   Influenza-Unspecified 12/17/2020   Moderna Covid-19 Vaccine Bivalent Booster 66yrs & up 12/17/2020   Moderna Sars-Covid-2 Vaccination 03/27/2019, 04/24/2019   Pneumococcal Conjugate-13 12/21/2013   Pneumococcal Polysaccharide-23 10/29/1997   Tdap 09/06/2001, 12/21/2013   Zoster, Live 03/09/2008   Health Maintenance Due  Topic Date Due   Zoster Vaccines- Shingrix (1 of 2) Never done   Pneumonia Vaccine 95+ Years old (3 of 3 - PPSV23 or PCV20) 12/22/2018   COVID-19 Vaccine (4 - 2023-24 season) 11/07/2021   Fecal DNA (Cologuard)  06/19/2022    Last colonoscopy: 06/20/2009 Last PSA:  Lab Results  Component Value Date   PSA <0.1 11/02/2016    Dentist:within the last year Ophtho: within the last year Exercise: Walking daily   Other doctors caring for patient include: Cardiology Dr. Elease Hashimoto  Advanced Directives: yes     Depression screen:  See questionnaire below.        09/02/2022   11:06 AM 06/03/2021    8:15 AM 05/30/2021    8:35 AM 05/28/2020    2:16 PM 05/15/2019   10:11 AM  Depression screen PHQ 2/9  Decreased Interest 0 0 0 0 0  Down, Depressed, Hopeless 0 0 0 0 0  PHQ - 2 Score 0 0 0 0 0    Fall Screen: See Questionaire below.      09/02/2022   11:09 AM 05/30/2021    8:35 AM 05/27/2021    9:51 AM 05/28/2020    2:16 PM 05/15/2019   10:11 AM  Fall Risk   Falls in the past year? 0 0 0 0 0  Number falls in past yr: 0  0 0   Injury with Fall? 0  0 0   Risk for fall due to : No Fall Risks Medication side effect  No Fall Risks   Follow up Falls evaluation completed Falls evaluation completed;Education provided;Falls prevention discussed  Falls evaluation completed     ADL screen:  See questionnaire below.  Functional Status Survey: Is the patient deaf or have difficulty hearing?: No Does the patient have difficulty seeing, even when wearing glasses/contacts?: No Does the patient have difficulty concentrating, remembering, or making decisions?: No Does the patient have difficulty walking or climbing stairs?: No Does the patient have difficulty dressing or bathing?: No Does the patient have difficulty doing errands alone such as visiting a doctor's office or shopping?: No   Review of Systems  Constitutional: -, -unexpected weight  change, -anorexia, -fatigue Allergy: -sneezing, -itching, -congestion Dermatology: denies changing moles, rash, lumps ENT: -runny nose, -ear pain, -sore throat,  Cardiology:  -chest pain, -palpitations, -orthopnea, Respiratory: -cough, -shortness of breath, -dyspnea on exertion, -wheezing,  Gastroenterology: -abdominal pain, -nausea, -vomiting, -diarrhea, -constipation, -dysphagia Hematology: -bleeding or bruising problems Musculoskeletal: -arthralgias, -myalgias, -joint swelling, -back pain, - Ophthalmology: -vision changes,  Urology: -dysuria, -difficulty  urinating,  -urinary frequency, -urgency, incontinence Neurology: -, -numbness, , -memory loss, -falls, -dizziness    PHYSICAL EXAM:  BP 112/62 (BP Location: Left Arm, Cuff Size: Normal)   Pulse 69   Temp 97.9 F (36.6 C) (Oral)   Ht 5\' 8"  (1.727 m)   Wt 181 lb 6.4 oz (82.3 kg)   SpO2 97% Comment: room air  BMI 27.58 kg/m   General Appearance: Alert, cooperative, no distress, appears stated age Head: Normocephalic, without obvious abnormality, atraumatic Eyes: PERRL, conjunctiva/corneas clear, EOM's intact, Ears: Normal TM's and external ear canals Nose: Nares normal, mucosa normal, no drainage or sinus   tenderness Throat: Lips, mucosa, and tongue normal; teeth and gums normal Neck: Supple, no lymphadenopathy, thyroid:no enlargement/tenderness/nodules; no carotid bruit or JVD Lungs: Clear to auscultation bilaterally without wheezes, rales or ronchi; respirations unlabored Heart: Regular rate and rhythm, S1 and S2 normal, no murmur, rub or gallop Abdomen: Soft, non-tender, nondistended, normoactive bowel sounds, no masses, no hepatosplenomegaly Skin: Skin color, texture, turgor normal, no rashes or lesions Lymph nodes: Cervical, supraclavicular, and axillary nodes normal Neurologic: CNII-XII intact, normal strength, sensation and gait; reflexes 2+ and symmetric throughout   Psych: Normal mood, affect, hygiene and grooming Hemoglobin A1c 6.2 ASSESSMENT/PLAN: Routine general medical examination at a health care facility  Dyslipidemia - Plan: Lipid panel, atorvastatin (LIPITOR) 40 MG tablet  LVH (left ventricular hypertrophy) - Plan: CBC with Differential/Platelet, Comprehensive metabolic panel  Primary hypertension - Plan: CBC with Differential/Platelet, Comprehensive metabolic panel, amLODipine (NORVASC) 5 MG tablet, losartan (COZAAR) 50 MG tablet  History of prostate cancer  Allergic rhinitis, unspecified seasonality, unspecified trigger  Degeneration of lumbar  intervertebral disc  Arthritis  Family history of heart disease in male family member before age 32  Prediabetes - Plan: Hemoglobin A1c  Attention deficit hyperactivity disorder (ADHD), predominantly hyperactive type   Discussed the need for him to continue with his regular exercise routine as well as dietary regimen.  The A1c seems to be stable so I do not see any need to be more aggressive than that.  He was comfortable with that. Immunization recommendations discussed.  Colonoscopy recommendations reviewed.   Medicare Attestation I have personally reviewed: The patient's medical and social history Their use of alcohol, tobacco or illicit drugs Their current medications and supplements The patient's functional ability including ADLs,fall risks, home safety risks, cognitive, and hearing and visual impairment Diet and physical activities Evidence for depression or mood disorders  The patient's weight, height, and BMI have been recorded in the chart.  I have made referrals, counseling, and provided education to the patient based on review of the above and I have provided the patient with a written personalized care plan for preventive services.     Sharlot Gowda, MD   09/02/2022

## 2022-09-03 LAB — CBC WITH DIFFERENTIAL/PLATELET
Basophils Absolute: 0 10*3/uL (ref 0.0–0.2)
Basos: 1 %
EOS (ABSOLUTE): 0.2 10*3/uL (ref 0.0–0.4)
Eos: 3 %
Hematocrit: 48 % (ref 37.5–51.0)
Hemoglobin: 16.3 g/dL (ref 13.0–17.7)
Immature Grans (Abs): 0 10*3/uL (ref 0.0–0.1)
Immature Granulocytes: 0 %
Lymphocytes Absolute: 1.8 10*3/uL (ref 0.7–3.1)
Lymphs: 23 %
MCH: 30.4 pg (ref 26.6–33.0)
MCHC: 34 g/dL (ref 31.5–35.7)
MCV: 89 fL (ref 79–97)
Monocytes Absolute: 0.7 10*3/uL (ref 0.1–0.9)
Monocytes: 9 %
Neutrophils Absolute: 4.9 10*3/uL (ref 1.4–7.0)
Neutrophils: 64 %
Platelets: 240 10*3/uL (ref 150–450)
RBC: 5.37 x10E6/uL (ref 4.14–5.80)
RDW: 13.8 % (ref 11.6–15.4)
WBC: 7.7 10*3/uL (ref 3.4–10.8)

## 2022-09-03 LAB — COMPREHENSIVE METABOLIC PANEL
ALT: 37 IU/L (ref 0–44)
AST: 23 IU/L (ref 0–40)
Albumin: 4.8 g/dL (ref 3.8–4.8)
Alkaline Phosphatase: 91 IU/L (ref 44–121)
BUN/Creatinine Ratio: 17 (ref 10–24)
BUN: 16 mg/dL (ref 8–27)
Bilirubin Total: 0.9 mg/dL (ref 0.0–1.2)
CO2: 23 mmol/L (ref 20–29)
Calcium: 9.9 mg/dL (ref 8.6–10.2)
Chloride: 104 mmol/L (ref 96–106)
Creatinine, Ser: 0.95 mg/dL (ref 0.76–1.27)
Globulin, Total: 2.2 g/dL (ref 1.5–4.5)
Glucose: 115 mg/dL — ABNORMAL HIGH (ref 70–99)
Potassium: 4.6 mmol/L (ref 3.5–5.2)
Sodium: 141 mmol/L (ref 134–144)
Total Protein: 7 g/dL (ref 6.0–8.5)
eGFR: 84 mL/min/{1.73_m2} (ref >59)

## 2022-09-03 LAB — LIPID PANEL
Chol/HDL Ratio: 2.4 ratio (ref 0.0–5.0)
Cholesterol, Total: 168 mg/dL (ref 100–199)
HDL: 71 mg/dL (ref >39)
LDL Chol Calc (NIH): 78 mg/dL (ref 0–99)
Triglycerides: 108 mg/dL (ref 0–149)
VLDL Cholesterol Cal: 19 mg/dL (ref 5–40)

## 2022-09-03 LAB — HEMOGLOBIN A1C: Est. average glucose Bld gHb Est-mCnc: 134 mg/dL

## 2022-10-23 DIAGNOSIS — X32XXXD Exposure to sunlight, subsequent encounter: Secondary | ICD-10-CM | POA: Diagnosis not present

## 2022-10-23 DIAGNOSIS — L738 Other specified follicular disorders: Secondary | ICD-10-CM | POA: Diagnosis not present

## 2022-10-23 DIAGNOSIS — Z08 Encounter for follow-up examination after completed treatment for malignant neoplasm: Secondary | ICD-10-CM | POA: Diagnosis not present

## 2022-10-23 DIAGNOSIS — L570 Actinic keratosis: Secondary | ICD-10-CM | POA: Diagnosis not present

## 2022-10-23 DIAGNOSIS — Z85828 Personal history of other malignant neoplasm of skin: Secondary | ICD-10-CM | POA: Diagnosis not present

## 2022-10-23 DIAGNOSIS — L700 Acne vulgaris: Secondary | ICD-10-CM | POA: Diagnosis not present

## 2022-12-31 ENCOUNTER — Other Ambulatory Visit: Payer: Self-pay | Admitting: Family Medicine

## 2022-12-31 DIAGNOSIS — E785 Hyperlipidemia, unspecified: Secondary | ICD-10-CM

## 2023-01-22 LAB — FECAL OCCULT BLOOD, IMMUNOCHEMICAL: IFOBT: NEGATIVE

## 2023-02-03 ENCOUNTER — Encounter: Payer: Self-pay | Admitting: Family Medicine

## 2023-02-10 DIAGNOSIS — M1990 Unspecified osteoarthritis, unspecified site: Secondary | ICD-10-CM | POA: Diagnosis not present

## 2023-02-10 DIAGNOSIS — F419 Anxiety disorder, unspecified: Secondary | ICD-10-CM | POA: Diagnosis not present

## 2023-02-10 DIAGNOSIS — E785 Hyperlipidemia, unspecified: Secondary | ICD-10-CM | POA: Diagnosis not present

## 2023-02-10 DIAGNOSIS — R32 Unspecified urinary incontinence: Secondary | ICD-10-CM | POA: Diagnosis not present

## 2023-02-10 DIAGNOSIS — F909 Attention-deficit hyperactivity disorder, unspecified type: Secondary | ICD-10-CM | POA: Diagnosis not present

## 2023-02-10 DIAGNOSIS — M4800 Spinal stenosis, site unspecified: Secondary | ICD-10-CM | POA: Diagnosis not present

## 2023-02-10 DIAGNOSIS — M545 Low back pain, unspecified: Secondary | ICD-10-CM | POA: Diagnosis not present

## 2023-02-10 DIAGNOSIS — R7303 Prediabetes: Secondary | ICD-10-CM | POA: Diagnosis not present

## 2023-02-10 DIAGNOSIS — J309 Allergic rhinitis, unspecified: Secondary | ICD-10-CM | POA: Diagnosis not present

## 2023-04-05 ENCOUNTER — Other Ambulatory Visit: Payer: Self-pay | Admitting: Family Medicine

## 2023-04-05 ENCOUNTER — Other Ambulatory Visit: Payer: Self-pay | Admitting: Cardiovascular Disease

## 2023-04-05 DIAGNOSIS — E785 Hyperlipidemia, unspecified: Secondary | ICD-10-CM

## 2023-04-23 DIAGNOSIS — Z1283 Encounter for screening for malignant neoplasm of skin: Secondary | ICD-10-CM | POA: Diagnosis not present

## 2023-04-23 DIAGNOSIS — X32XXXD Exposure to sunlight, subsequent encounter: Secondary | ICD-10-CM | POA: Diagnosis not present

## 2023-04-23 DIAGNOSIS — L570 Actinic keratosis: Secondary | ICD-10-CM | POA: Diagnosis not present

## 2023-04-23 DIAGNOSIS — Z08 Encounter for follow-up examination after completed treatment for malignant neoplasm: Secondary | ICD-10-CM | POA: Diagnosis not present

## 2023-04-23 DIAGNOSIS — Z85828 Personal history of other malignant neoplasm of skin: Secondary | ICD-10-CM | POA: Diagnosis not present

## 2023-04-23 DIAGNOSIS — D225 Melanocytic nevi of trunk: Secondary | ICD-10-CM | POA: Diagnosis not present

## 2023-05-04 ENCOUNTER — Other Ambulatory Visit: Payer: Self-pay | Admitting: Family Medicine

## 2023-05-04 ENCOUNTER — Encounter: Payer: Self-pay | Admitting: Internal Medicine

## 2023-05-04 DIAGNOSIS — F40243 Fear of flying: Secondary | ICD-10-CM

## 2023-06-17 DIAGNOSIS — X32XXXD Exposure to sunlight, subsequent encounter: Secondary | ICD-10-CM | POA: Diagnosis not present

## 2023-06-17 DIAGNOSIS — L570 Actinic keratosis: Secondary | ICD-10-CM | POA: Diagnosis not present

## 2023-06-28 ENCOUNTER — Other Ambulatory Visit: Payer: Self-pay | Admitting: Family Medicine

## 2023-06-28 ENCOUNTER — Other Ambulatory Visit: Payer: Self-pay | Admitting: Cardiovascular Disease

## 2023-06-28 DIAGNOSIS — I10 Essential (primary) hypertension: Secondary | ICD-10-CM

## 2023-09-02 ENCOUNTER — Ambulatory Visit: Payer: Self-pay

## 2023-09-02 ENCOUNTER — Telehealth: Admitting: Physician Assistant

## 2023-09-02 DIAGNOSIS — R197 Diarrhea, unspecified: Secondary | ICD-10-CM | POA: Diagnosis not present

## 2023-09-02 MED ORDER — CIPROFLOXACIN HCL 500 MG PO TABS
500.0000 mg | ORAL_TABLET | Freq: Two times a day (BID) | ORAL | 0 refills | Status: AC
Start: 2023-09-02 — End: 2023-09-05

## 2023-09-02 NOTE — Patient Instructions (Signed)
 Terry Hays, thank you for joining Delon CHRISTELLA Dickinson, PA-C for today's virtual visit.  While this provider is not your primary care provider (PCP), if your PCP is located in our provider database this encounter information will be shared with them immediately following your visit.   A [name] account gives you access to today's visit and all your visits, tests, and labs performed at [facility]  click here if you don't have a [name] account or go to [email or web address]  Consent: (Patient) Terry Hays provided verbal consent for this virtual visit at the beginning of the encounter.  Current Medications:  Current Outpatient Medications:    ciprofloxacin  (CIPRO ) 500 MG tablet, Take 1 tablet (500 mg total) by mouth 2 (two) times daily for 3 days., Disp: 6 tablet, Rfl: 0   ALPRAZolam  (XANAX ) 0.25 MG tablet, TAKE 1 TABLET BY MOUTH AT BEDTIME AS NEEDED FOR ANXIETY (FOR flying), Disp: 30 tablet, Rfl: 1   amLODipine  (NORVASC ) 5 MG tablet, TAKE 1 TABLET BY MOUTH DAILY, Disp: 90 tablet, Rfl: 3   aspirin  EC 81 MG tablet, Take 1 tablet (81 mg total) by mouth daily., Disp: 90 tablet, Rfl: 3   atorvastatin  (LIPITOR) 40 MG tablet, TAKE 1 TABLET BY MOUTH DAILY, Disp: 90 tablet, Rfl: 1   losartan  (COZAAR ) 50 MG tablet, TAKE 1 TABLET BY MOUTH DAILY, Disp: 90 tablet, Rfl: 3   magnesium gluconate (MAGONATE) 500 MG tablet, Take 500 mg by mouth daily., Disp: , Rfl:    potassium chloride  (KLOR-CON ) 10 MEQ tablet, TAKE 1 TABLET BY MOUTH ONCE DAILY, Disp: 90 tablet, Rfl: 3   TURMERIC PO, Take 1,000 mg by mouth daily., Disp: , Rfl:    VYVANSE 10 MG CHEW, Chew 1 tablet by mouth as needed., Disp: , Rfl:    VYVANSE 60 MG CHEW, Chew by mouth., Disp: , Rfl:    Medications ordered in this encounter:  Meds ordered this encounter  Medications   ciprofloxacin  (CIPRO ) 500 MG tablet    Sig: Take 1 tablet (500 mg total) by mouth 2 (two) times daily for 3 days.     Dispense:  6 tablet    Refill:  0    Please deliver to patient    Supervising Provider:   BLAISE ALEENE KIDD [[id number]]     *If you need refills on other medications prior to your next appointment, please contact your pharmacy*  Follow-Up: Call back or seek an in-person evaluation if the symptoms worsen or if the condition fails to improve as anticipated.  [facility] [phone]  Other Instructions Diarrhea, Adult Diarrhea is frequent loose and sometimes watery bowel movements. Diarrhea can make you feel weak and cause you to become dehydrated. Dehydration is a condition in which there is not enough water or other fluids in the body. Dehydration can make you tired and thirsty, cause you to have a dry mouth, and decrease how often you urinate. Diarrhea typically lasts 2-3 days. However, it can last longer if it is a sign of something more serious. It is important to treat your diarrhea as told by your health care provider. Follow these instructions at home: Eating and drinking     Follow these recommendations as told by your health care provider: Take an oral rehydration solution (ORS). This is an over-the-counter medicine that helps return your body to its normal balance of nutrients and water. It is found at pharmacies and retail stores. Drink enough fluid to keep your  urine pale yellow. Drink fluids such as water, diluted fruit juice, and low-calorie sports drinks. You can drink milk also, if desired. Sucking on ice chips is another way to get fluids. Avoid drinking fluids that contain a lot of sugar or caffeine, such as soda, energy drinks, and regular sports drinks. Avoid alcohol. Eat bland, easy-to-digest foods in small amounts as you are able. These foods include bananas, applesauce, rice, lean meats, toast, and crackers. Avoid spicy or fatty foods.  Medicines Take over-the-counter and prescription medicines only as told by your health care provider. If you were  prescribed antibiotics, take them as told by your health care provider. Do not stop using the antibiotic even if you start to feel better. General instructions  Wash your hands often using soap and water for at least 20 seconds. If soap and water are not available, use hand sanitizer. Others in the household should wash their hands as well. Hands should be washed: After using the toilet or changing a diaper. Before preparing, cooking, or serving food. While caring for a sick person or while visiting someone in a hospital. Rest at home while you recover. Take a warm bath to relieve any burning or pain from frequent diarrhea episodes. Watch your condition for any changes. Contact a health care provider if: You have a fever. Your diarrhea gets worse. You have new symptoms. You vomit every time you eat or drink. You feel light-headed, dizzy, or have a headache. You have muscle cramps. You have signs of dehydration, such as: Dark urine, very little urine, or no urine. Cracked lips. Dry mouth. Sunken eyes. Sleepiness. Weakness. You have bloody or black stools or stools that look like tar. You have severe pain, cramping, or bloating in your abdomen. Your skin feels cold and clammy. You feel confused. Get help right away if: You have chest pain or your heart is beating very quickly. You have trouble breathing or you are breathing very quickly. You feel extremely weak or you faint. These symptoms may be an emergency. Get help right away. Call 911. Do not wait to see if the symptoms will go away. Do not drive yourself to the hospital. This information is not intended to replace advice given to you by your health care provider. Make sure you discuss any questions you have with your health care provider. Document Revised: 08/12/2021 Document Reviewed: 08/12/2021 Elsevier Patient Education  2024 Elsevier Inc.   If you have been instructed to have an in-person evaluation today at a local  Urgent Care facility, please use the link below. It will take you to a list of all of our available [facility], including address, phone number and hours of operation. Please do not delay care.  [facility]  If you or a family member do not have a primary care provider, use the link below to schedule a visit and establish care. When you choose a [facility] primary care physician or advanced practice provider, you gain a long-term partner in health. Find a Primary Care Provider  Learn more about [facility]'s in-office and virtual care options: [facility] - Get Care Now

## 2023-09-02 NOTE — Progress Notes (Signed)
 Virtual Visit Consent   Terry Hays, you are scheduled for a virtual visit with a [facility] provider today. Just as with appointments in the office, your consent must be obtained to participate. Your consent will be active for this visit and any virtual visit you may have with one of our providers in the next 365 days. If you have a MyChart account, a copy of this consent can be sent to you electronically.  As this is a virtual visit, video technology does not allow for your provider to perform a traditional examination. This may limit your provider's ability to fully assess your condition. If your provider identifies any concerns that need to be evaluated in person or the need to arrange testing (such as labs, EKG, etc.), we will make arrangements to do so. Although advances in technology are sophisticated, we cannot ensure that it will always work on either your end or our end. If the connection with a video visit is poor, the visit may have to be switched to a telephone visit. With either a video or telephone visit, we are not always able to ensure that we have a secure connection.  By engaging in this virtual visit, you consent to the provision of healthcare and authorize for your insurance to be billed (if applicable) for the services provided during this visit. Depending on your insurance coverage, you may receive a charge related to this service.  I need to obtain your verbal consent now. Are you willing to proceed with your visit today? [name] has provided verbal consent on 09/02/2023 for a virtual visit (video or telephone). Delon CHRISTELLA Dickinson, PA-C  Date: 09/02/2023 3:41 PM   Virtual Visit via Video Note   I, Delon CHRISTELLA Dickinson, connected with  Terry Hays  ([id number], 1950-03-24) on 09/02/23 at  2:30 PM EDT by a video-enabled telemedicine application and verified that I am speaking with the correct person using two identifiers.  Location: Patient: Virtual Visit  Location Patient: Home Provider: Virtual Visit Location Provider: Home Office   I discussed the limitations of evaluation and management by telemedicine and the availability of in person appointments. The patient expressed understanding and agreed to proceed.    History of Present Illness: Terry Hays is a 73 y.o. who identifies as a male who was assigned male at birth, and is being seen today for diarrhea.  HPI: Diarrhea  This is a new problem. The current episode started yesterday. The problem occurs 2 to 4 times per day. The problem has been gradually worsening. The stool consistency is described as Watery (explosive; does report having formed black stools about 5-6 days prior to onset but feels this is from his Multivitamin that is a dark green color). The patient states that diarrhea awakens him from sleep. Associated symptoms include abdominal pain, bloating (last night before onset of diarrhea), chills and headaches. Pertinent negatives include no coughing, fever, myalgias, sweats, URI or vomiting. Associated symptoms comments: DOE, winded with going up stairs. Nothing aggravates the symptoms. There are no known risk factors. Treatments tried: aleve. The treatment provided no relief.  Had been swimming in a lake recently and also swimming at a public pool ([place]) prior to onset of diarrhea.   Problems:  Patient Active Problem List   Diagnosis Date Noted   Attention deficit hyperactivity disorder (ADHD), predominantly hyperactive type 06/03/2021   Cherry angioma 06/03/2021   Seborrheic keratosis 06/03/2021   Arthritis 03/17/2018   Spinal stenosis of lumbosacral region  03/17/2018   Actinic keratosis 03/17/2018   Degeneration of lumbar intervertebral disc 10/27/2017   History of prostate cancer 09/15/2011   Fear of flying 09/15/2011   Family history of heart disease in male family member before age 60 09/15/2011   Allergic rhinitis 09/15/2011   Hemorrhoids 09/15/2011    Hypertension 06/27/2010   Dyslipidemia    LVH (left ventricular hypertrophy)     Allergies:  Allergies  Allergen Reactions   Tramadol Other (See Comments)    hallucinations   Medications:  Current Outpatient Medications:    ciprofloxacin  (CIPRO ) 500 MG tablet, Take 1 tablet (500 mg total) by mouth 2 (two) times daily for 3 days., Disp: 6 tablet, Rfl: 0   ALPRAZolam  (XANAX ) 0.25 MG tablet, TAKE 1 TABLET BY MOUTH AT BEDTIME AS NEEDED FOR ANXIETY (FOR flying), Disp: 30 tablet, Rfl: 1   amLODipine  (NORVASC ) 5 MG tablet, TAKE 1 TABLET BY MOUTH DAILY, Disp: 90 tablet, Rfl: 3   aspirin  EC 81 MG tablet, Take 1 tablet (81 mg total) by mouth daily., Disp: 90 tablet, Rfl: 3   atorvastatin  (LIPITOR) 40 MG tablet, TAKE 1 TABLET BY MOUTH DAILY, Disp: 90 tablet, Rfl: 1   losartan  (COZAAR ) 50 MG tablet, TAKE 1 TABLET BY MOUTH DAILY, Disp: 90 tablet, Rfl: 3   magnesium gluconate (MAGONATE) 500 MG tablet, Take 500 mg by mouth daily., Disp: , Rfl:    potassium chloride  (KLOR-CON ) 10 MEQ tablet, TAKE 1 TABLET BY MOUTH ONCE DAILY, Disp: 90 tablet, Rfl: 3   TURMERIC PO, Take 1,000 mg by mouth daily., Disp: , Rfl:    VYVANSE 10 MG CHEW, Chew 1 tablet by mouth as needed., Disp: , Rfl:    VYVANSE 60 MG CHEW, Chew by mouth., Disp: , Rfl:   Observations/Objective: Patient is well-developed, well-nourished in no acute distress.  Resting comfortably at home.  Head is normocephalic, atraumatic.  No labored breathing.  Speech is clear and coherent with logical content.  Patient is alert and oriented at baseline.    Assessment and Plan: 1. Diarrhea of presumed infectious origin (Primary) - ciprofloxacin  (CIPRO ) 500 MG tablet; Take 1 tablet (500 mg total) by mouth 2 (two) times daily for 3 days.  Dispense: 6 tablet; Refill: 0  - Will provide Cipro  for potential bacterial diarrhea exposure - Advised can use Imodium if diarrhea becomes frequent (more than once per hour) - Push fluids; electrolyte beverages -  Bland diet - Strict precautions if symptoms worsen or do not improve within 24-48 hours of starting the antibiotic he should seek in person evaluation  Follow Up Instructions: I discussed the assessment and treatment plan with the patient. The patient was provided an opportunity to ask questions and all were answered. The patient agreed with the plan and demonstrated an understanding of the instructions.  A copy of instructions were sent to the patient via MyChart unless otherwise noted below.    The patient was advised to call back or seek an in-person evaluation if the symptoms worsen or if the condition fails to improve as anticipated.    Delon CHRISTELLA Dickinson, PA-C

## 2023-09-02 NOTE — Telephone Encounter (Signed)
 Copied from CRM [id number]. Topic: Clinical - Red Word Triage >> Sep 02, 2023  1:39 PM [place] MATSU wrote: Red Word that prompted transfer to Nurse Triage: headache, fatigue, shortness of breathe, diarrhea   FYI Only or Action Required?: FYI only for provider.  Patient was last seen in primary care on 09/02/2022 by [name], MD. Called Nurse Triage reporting Diarrhea. Symptoms began yesterday. Interventions attempted: Nothing. Symptoms are: unchanged.  Triage Disposition: See Physician Within 24 Hours  Patient/caregiver understands and will follow disposition?:    Reason for Disposition  [1] MODERATE diarrhea (e.g., 4-6 times / day more than normal) AND [2] age > 70 years  Answer Assessment - Initial Assessment Questions 1. DIARRHEA SEVERITY: How bad is the diarrhea? How many more stools have you had in the past 24 hours than normal?    - NO DIARRHEA (SCALE 0)   - MILD (SCALE 1-3): Few loose or mushy BMs; increase of 1-3 stools over normal daily number of stools; mild increase in ostomy output.   -  MODERATE (SCALE 4-7): Increase of 4-6 stools daily over normal; moderate increase in ostomy output.   -  SEVERE (SCALE 8-10; OR WORST POSSIBLE): Increase of 7 or more stools daily over normal; moderate increase in ostomy output; incontinence.     Mild to moderate 2. ONSET: When did the diarrhea begin?      Yesterday  3. BM CONSISTENCY: How loose or watery is the diarrhea?      Watery  4. VOMITING: Are you also vomiting? If Yes, ask: How many times in the past 24 hours?      No 5. ABDOMEN PAIN: Are you having any abdomen pain? If Yes, ask: What does it feel like? (e.g., crampy, dull, intermittent, constant)      No 6. ABDOMEN PAIN SEVERITY: If present, ask: How bad is the pain?  (e.g., Scale 1-10; mild, moderate, or severe)   - MILD (1-3): doesn't interfere with normal activities, abdomen soft and not tender to touch    - MODERATE (4-7): interferes with normal  activities or awakens from sleep, abdomen tender to touch    - SEVERE (8-10): excruciating pain, doubled over, unable to do any normal activities       0/10 7. ORAL INTAKE: If vomiting, Have you been able to drink liquids? How much liquids have you had in the past 24 hours?     N/A 8. HYDRATION: Any signs of dehydration? (e.g., dry mouth [not just dry lips], too weak to stand, dizziness, new weight loss) When did you last urinate?     No 9. EXPOSURE: Have you traveled to a foreign country recently? Have you been exposed to anyone with diarrhea? Could you have eaten any food that was spoiled?     No 10. ANTIBIOTIC USE: Are you taking antibiotics now or have you taken antibiotics in the past 2 months?       No 11. OTHER SYMPTOMS: Do you have any other symptoms? (e.g., fever, blood in stool)       Headache, fatigue, chills  Protocols used: Diarrhea-A-AH

## 2023-09-07 ENCOUNTER — Ambulatory Visit: Payer: Self-pay

## 2023-09-07 ENCOUNTER — Telehealth

## 2023-09-07 NOTE — Telephone Encounter (Signed)
 FYI Only or Action Required?: FYI only for provider.  Patient was last seen in primary care on 09/02/2022 by Terry Norleen BROCKS, MD. Called Nurse Triage reporting Abdominal Pain. Symptoms began a week ago. Interventions attempted: Prescription medications: ABX. Symptoms are: gradually worsening.  Triage Disposition: See Physician Within 24 Hours  Patient/caregiver understands and will follow disposition?: Yes   Copied from CRM [id number]. Topic: Clinical - Red Word Triage >> Sep 07, 2023  1:22 PM Terry Hays wrote: Red Word that prompted transfer to Nurse Triage: Patient is having stomach pain he called in a few days ago and was advised by NT to do telehealth prescribed crpio for 3 days the diahreah left but his stomach is still bothering him. Terry Hays is what 2 of his medical friend says he migh have this. Reason for Disposition  [1] MODERATE pain (e.g., interferes with normal activities) AND [2] pain comes and goes (cramps) AND [3] present > 24 hours  (Exception: Pain with Vomiting or Diarrhea - see that Guideline.)  Answer Assessment - Initial Assessment Questions 1. LOCATION: Where does it hurt?      Abd pain in middle on right side 2. RADIATION: Does the pain shoot anywhere else? (e.g., chest, back)     na 3. ONSET: When did the pain begin? (Minutes, hours or days ago)      X 1 week 4. SUDDEN: Gradual or sudden onset?     na 5. PATTERN Does the pain come and go, or is it constant?    - If it comes and goes: How long does it last? Do you have pain now?     (Note: Comes and goes means the pain is intermittent. It goes away completely between bouts.)    - If constant: Is it getting better, staying the same, or getting worse?      (Note: Constant means the pain never goes away completely; most serious pain is constant and gets worse.)      Comes and goes 6. SEVERITY: How bad is the pain?  (e.g., Scale 1-10; mild, moderate, or severe)    - MILD (1-3): Doesn't interfere with  normal activities, abdomen soft and not tender to touch.     - MODERATE (4-7): Interferes with normal activities or awakens from sleep, abdomen tender to touch.     - SEVERE (8-10): Excruciating pain, doubled over, unable to do any normal activities.       moderate 7. RECURRENT SYMPTOM: Have you ever had this type of stomach pain before? If Yes, ask: When was the last time? and What happened that time?      no 8. CAUSE: What do you think is causing the stomach pain?     Unknown  9. RELIEVING/AGGRAVATING FACTORS: What makes it better or worse? (e.g., antacids, bending or twisting motion, bowel movement)     N/a 10. OTHER SYMPTOMS: Do you have any other symptoms? (e.g., back pain, diarrhea, fever, urination pain, vomiting)       no  Pt does not have diarrhea anymore. Pt was told by some medical friends he may have a parasite and he needs to have his stool checked.  Pt collected a stool sample and wants to see if provider can order the test.  Protocols used: Abdominal Pain - Male-A-AH

## 2023-09-08 ENCOUNTER — Emergency Department (HOSPITAL_BASED_OUTPATIENT_CLINIC_OR_DEPARTMENT_OTHER)

## 2023-09-08 ENCOUNTER — Encounter (HOSPITAL_BASED_OUTPATIENT_CLINIC_OR_DEPARTMENT_OTHER): Payer: Self-pay | Admitting: Emergency Medicine

## 2023-09-08 ENCOUNTER — Observation Stay (HOSPITAL_COMMUNITY)

## 2023-09-08 ENCOUNTER — Other Ambulatory Visit: Payer: Self-pay

## 2023-09-08 ENCOUNTER — Inpatient Hospital Stay (HOSPITAL_BASED_OUTPATIENT_CLINIC_OR_DEPARTMENT_OTHER)
Admission: EM | Admit: 2023-09-08 | Discharge: 2023-09-21 | DRG: 041 | Disposition: A | Discharge status: Skilled Nursing Facility | Attending: Internal Medicine | Admitting: Internal Medicine

## 2023-09-08 DIAGNOSIS — R918 Other nonspecific abnormal finding of lung field: Secondary | ICD-10-CM | POA: Diagnosis not present

## 2023-09-08 DIAGNOSIS — I63431 Cerebral infarction due to embolism of right posterior cerebral artery: Principal | ICD-10-CM | POA: Diagnosis present

## 2023-09-08 DIAGNOSIS — I700 Atherosclerosis of aorta: Secondary | ICD-10-CM | POA: Diagnosis not present

## 2023-09-08 DIAGNOSIS — R109 Unspecified abdominal pain: Secondary | ICD-10-CM

## 2023-09-08 DIAGNOSIS — F17290 Nicotine dependence, other tobacco product, uncomplicated: Secondary | ICD-10-CM | POA: Diagnosis present

## 2023-09-08 DIAGNOSIS — G319 Degenerative disease of nervous system, unspecified: Secondary | ICD-10-CM | POA: Diagnosis not present

## 2023-09-08 DIAGNOSIS — R1310 Dysphagia, unspecified: Secondary | ICD-10-CM | POA: Diagnosis present

## 2023-09-08 DIAGNOSIS — Z5986 Financial insecurity: Secondary | ICD-10-CM

## 2023-09-08 DIAGNOSIS — D649 Anemia, unspecified: Secondary | ICD-10-CM | POA: Diagnosis present

## 2023-09-08 DIAGNOSIS — H5340 Unspecified visual field defects: Secondary | ICD-10-CM | POA: Diagnosis present

## 2023-09-08 DIAGNOSIS — R7303 Prediabetes: Secondary | ICD-10-CM | POA: Diagnosis present

## 2023-09-08 DIAGNOSIS — R29702 NIHSS score 2: Secondary | ICD-10-CM | POA: Diagnosis present

## 2023-09-08 DIAGNOSIS — I10 Essential (primary) hypertension: Secondary | ICD-10-CM | POA: Diagnosis present

## 2023-09-08 DIAGNOSIS — D500 Iron deficiency anemia secondary to blood loss (chronic): Secondary | ICD-10-CM | POA: Diagnosis not present

## 2023-09-08 DIAGNOSIS — E785 Hyperlipidemia, unspecified: Secondary | ICD-10-CM | POA: Diagnosis present

## 2023-09-08 DIAGNOSIS — Z751 Person awaiting admission to adequate facility elsewhere: Secondary | ICD-10-CM | POA: Diagnosis not present

## 2023-09-08 DIAGNOSIS — H5462 Unqualified visual loss, left eye, normal vision right eye: Secondary | ICD-10-CM | POA: Diagnosis present

## 2023-09-08 DIAGNOSIS — E872 Acidosis, unspecified: Secondary | ICD-10-CM | POA: Diagnosis present

## 2023-09-08 DIAGNOSIS — Z8249 Family history of ischemic heart disease and other diseases of the circulatory system: Secondary | ICD-10-CM

## 2023-09-08 DIAGNOSIS — H53462 Homonymous bilateral field defects, left side: Secondary | ICD-10-CM | POA: Diagnosis present

## 2023-09-08 DIAGNOSIS — R410 Disorientation, unspecified: Secondary | ICD-10-CM

## 2023-09-08 DIAGNOSIS — I6389 Other cerebral infarction: Secondary | ICD-10-CM | POA: Diagnosis not present

## 2023-09-08 DIAGNOSIS — R4189 Other symptoms and signs involving cognitive functions and awareness: Secondary | ICD-10-CM | POA: Diagnosis not present

## 2023-09-08 DIAGNOSIS — Z8546 Personal history of malignant neoplasm of prostate: Secondary | ICD-10-CM

## 2023-09-08 DIAGNOSIS — I6523 Occlusion and stenosis of bilateral carotid arteries: Secondary | ICD-10-CM | POA: Diagnosis not present

## 2023-09-08 DIAGNOSIS — I517 Cardiomegaly: Secondary | ICD-10-CM | POA: Diagnosis not present

## 2023-09-08 DIAGNOSIS — N281 Cyst of kidney, acquired: Secondary | ICD-10-CM | POA: Diagnosis not present

## 2023-09-08 DIAGNOSIS — H547 Unspecified visual loss: Secondary | ICD-10-CM | POA: Diagnosis not present

## 2023-09-08 DIAGNOSIS — E878 Other disorders of electrolyte and fluid balance, not elsewhere classified: Secondary | ICD-10-CM | POA: Diagnosis present

## 2023-09-08 DIAGNOSIS — H53452 Other localized visual field defect, left eye: Secondary | ICD-10-CM

## 2023-09-08 DIAGNOSIS — F901 Attention-deficit hyperactivity disorder, predominantly hyperactive type: Secondary | ICD-10-CM | POA: Diagnosis present

## 2023-09-08 DIAGNOSIS — R911 Solitary pulmonary nodule: Secondary | ICD-10-CM | POA: Diagnosis present

## 2023-09-08 DIAGNOSIS — K5730 Diverticulosis of large intestine without perforation or abscess without bleeding: Secondary | ICD-10-CM | POA: Diagnosis not present

## 2023-09-08 DIAGNOSIS — K429 Umbilical hernia without obstruction or gangrene: Secondary | ICD-10-CM | POA: Diagnosis not present

## 2023-09-08 DIAGNOSIS — F429 Obsessive-compulsive disorder, unspecified: Secondary | ICD-10-CM | POA: Diagnosis present

## 2023-09-08 DIAGNOSIS — R519 Headache, unspecified: Secondary | ICD-10-CM | POA: Diagnosis not present

## 2023-09-08 DIAGNOSIS — K5900 Constipation, unspecified: Secondary | ICD-10-CM | POA: Diagnosis present

## 2023-09-08 DIAGNOSIS — I639 Cerebral infarction, unspecified: Principal | ICD-10-CM

## 2023-09-08 DIAGNOSIS — I69391 Dysphagia following cerebral infarction: Secondary | ICD-10-CM | POA: Diagnosis not present

## 2023-09-08 DIAGNOSIS — I63531 Cerebral infarction due to unspecified occlusion or stenosis of right posterior cerebral artery: Secondary | ICD-10-CM | POA: Diagnosis not present

## 2023-09-08 DIAGNOSIS — Z8673 Personal history of transient ischemic attack (TIA), and cerebral infarction without residual deficits: Secondary | ICD-10-CM | POA: Diagnosis not present

## 2023-09-08 DIAGNOSIS — Z8719 Personal history of other diseases of the digestive system: Secondary | ICD-10-CM | POA: Diagnosis not present

## 2023-09-08 DIAGNOSIS — Z79899 Other long term (current) drug therapy: Secondary | ICD-10-CM | POA: Diagnosis not present

## 2023-09-08 DIAGNOSIS — I6782 Cerebral ischemia: Secondary | ICD-10-CM | POA: Diagnosis not present

## 2023-09-08 DIAGNOSIS — Z7982 Long term (current) use of aspirin: Secondary | ICD-10-CM | POA: Diagnosis not present

## 2023-09-08 DIAGNOSIS — K921 Melena: Secondary | ICD-10-CM | POA: Diagnosis not present

## 2023-09-08 DIAGNOSIS — R29818 Other symptoms and signs involving the nervous system: Secondary | ICD-10-CM | POA: Diagnosis not present

## 2023-09-08 DIAGNOSIS — F909 Attention-deficit hyperactivity disorder, unspecified type: Secondary | ICD-10-CM | POA: Diagnosis not present

## 2023-09-08 DIAGNOSIS — R4182 Altered mental status, unspecified: Secondary | ICD-10-CM | POA: Diagnosis not present

## 2023-09-08 DIAGNOSIS — R9082 White matter disease, unspecified: Secondary | ICD-10-CM | POA: Diagnosis not present

## 2023-09-08 LAB — URINE DRUG SCREEN
Amphetamines: DETECTED — AB
Barbiturates: NOT DETECTED
Benzodiazepines: NOT DETECTED
Cocaine: NOT DETECTED
Fentanyl: NOT DETECTED
Methadone Scn, Ur: NOT DETECTED
Opiates: NOT DETECTED
Tetrahydrocannabinol: NOT DETECTED

## 2023-09-08 LAB — COMPREHENSIVE METABOLIC PANEL WITH GFR
ALT: 37 U/L (ref 0–44)
AST: 24 U/L (ref 15–41)
Albumin: 4.2 g/dL (ref 3.5–5.0)
Alkaline Phosphatase: 88 U/L (ref 38–126)
Anion gap: 11 (ref 5–15)
BUN: 17 mg/dL (ref 8–23)
CO2: 23 mmol/L (ref 22–32)
Calcium: 9.3 mg/dL (ref 8.9–10.3)
Chloride: 106 mmol/L (ref 98–111)
Creatinine, Ser: 1.11 mg/dL (ref 0.61–1.24)
GFR, Estimated: >60 mL/min (ref >60)
Glucose, Bld: 196 mg/dL — ABNORMAL HIGH (ref 70–99)
Potassium: 4.1 mmol/L (ref 3.5–5.1)
Sodium: 140 mmol/L (ref 135–145)
Total Bilirubin: 0.5 mg/dL (ref 0.0–1.2)
Total Protein: 6.9 g/dL (ref 6.5–8.1)

## 2023-09-08 LAB — URINALYSIS, W/ REFLEX TO CULTURE (INFECTION SUSPECTED)
Bilirubin Urine: NEGATIVE
Glucose, UA: NEGATIVE mg/dL
Hgb urine dipstick: NEGATIVE
Ketones, ur: NEGATIVE mg/dL
Leukocytes,Ua: NEGATIVE
Nitrite: NEGATIVE
Protein, ur: NEGATIVE mg/dL
Specific Gravity, Urine: 1.006 (ref 1.005–1.030)
pH: 5.5 (ref 5.0–8.0)

## 2023-09-08 LAB — CBC WITH DIFFERENTIAL/PLATELET
Abs Immature Granulocytes: 0.04 10*3/uL (ref 0.00–0.07)
Basophils Absolute: 0 10*3/uL (ref 0.0–0.1)
Basophils Relative: 1 %
Eosinophils Absolute: 0.2 10*3/uL (ref 0.0–0.5)
Eosinophils Relative: 3 %
HCT: 28.9 % — ABNORMAL LOW (ref 39.0–52.0)
Hemoglobin: 9.4 g/dL — ABNORMAL LOW (ref 13.0–17.0)
Immature Granulocytes: 1 %
Lymphocytes Relative: 21 %
Lymphs Abs: 1.2 10*3/uL (ref 0.7–4.0)
MCH: 30.9 pg (ref 26.0–34.0)
MCHC: 32.5 g/dL (ref 30.0–36.0)
MCV: 95.1 fL (ref 80.0–100.0)
Monocytes Absolute: 0.6 10*3/uL (ref 0.1–1.0)
Monocytes Relative: 11 %
Neutro Abs: 3.7 10*3/uL (ref 1.7–7.7)
Neutrophils Relative %: 63 %
Platelets: 290 10*3/uL (ref 150–400)
RBC: 3.04 MIL/uL — ABNORMAL LOW (ref 4.22–5.81)
RDW: 15.9 % — ABNORMAL HIGH (ref 11.5–15.5)
WBC: 5.8 10*3/uL (ref 4.0–10.5)
nRBC: 0 % (ref 0.0–0.2)

## 2023-09-08 LAB — TSH: TSH: 2.66 u[IU]/mL (ref 0.350–4.500)

## 2023-09-08 LAB — AMMONIA: Ammonia: <13 umol/L (ref 9–35)

## 2023-09-08 LAB — PROTIME-INR
INR: 1 (ref 0.8–1.2)
Prothrombin Time: 13.6 s (ref 11.4–15.2)

## 2023-09-08 LAB — CBG MONITORING, ED: Glucose-Capillary: 194 mg/dL — ABNORMAL HIGH (ref 70–99)

## 2023-09-08 LAB — LACTIC ACID, PLASMA: Lactic Acid, Venous: 2 mmol/L (ref 0.5–1.9)

## 2023-09-08 LAB — ETHANOL: Alcohol, Ethyl (B): <15 mg/dL (ref <15)

## 2023-09-08 MED ORDER — PANTOPRAZOLE SODIUM 40 MG PO TBEC
40.0000 mg | DELAYED_RELEASE_TABLET | Freq: Two times a day (BID) | ORAL | Status: DC
  Administered 2023-09-08 – 2023-09-21 (×26): 40 mg via ORAL
  Filled 2023-09-08 (×26): qty 1

## 2023-09-08 MED ORDER — ACETAMINOPHEN 650 MG RE SUPP: 650.0000 mg | RECTAL | Status: DC | PRN

## 2023-09-08 MED ORDER — LOSARTAN POTASSIUM 50 MG PO TABS
50.0000 mg | ORAL_TABLET | Freq: Every day | ORAL | Status: DC
  Administered 2023-09-09 – 2023-09-21 (×13): 50 mg via ORAL
  Filled 2023-09-08 (×13): qty 1

## 2023-09-08 MED ORDER — ATORVASTATIN CALCIUM 40 MG PO TABS
40.0000 mg | ORAL_TABLET | Freq: Every day | ORAL | Status: DC
  Administered 2023-09-09 – 2023-09-21 (×13): 40 mg via ORAL
  Filled 2023-09-08 (×13): qty 1

## 2023-09-08 MED ORDER — CLOPIDOGREL BISULFATE 75 MG PO TABS
75.0000 mg | ORAL_TABLET | Freq: Every day | ORAL | Status: DC
  Administered 2023-09-08 – 2023-09-21 (×14): 75 mg via ORAL
  Filled 2023-09-08 (×14): qty 1

## 2023-09-08 MED ORDER — IOHEXOL 350 MG/ML SOLN
100.0000 mL | Freq: Once | INTRAVENOUS | Status: AC | PRN
  Administered 2023-09-08: 85 mL via INTRAVENOUS

## 2023-09-08 MED ORDER — GADOBUTROL 1 MMOL/ML IV SOLN
8.0000 mL | Freq: Once | INTRAVENOUS | Status: AC | PRN
  Administered 2023-09-08: 8 mL via INTRAVENOUS

## 2023-09-08 MED ORDER — SENNOSIDES-DOCUSATE SODIUM 8.6-50 MG PO TABS: 1.0000 | ORAL_TABLET | Freq: Every evening | ORAL | Status: DC | PRN

## 2023-09-08 MED ORDER — ACETAMINOPHEN 325 MG PO TABS: 650.0000 mg | ORAL_TABLET | ORAL | Status: DC | PRN

## 2023-09-08 MED ORDER — ACETAMINOPHEN 160 MG/5ML PO SOLN: 650.0000 mg | ORAL | Status: DC | PRN

## 2023-09-08 MED ORDER — ASPIRIN 81 MG PO TBEC
81.0000 mg | DELAYED_RELEASE_TABLET | Freq: Every day | ORAL | Status: DC
  Administered 2023-09-08 – 2023-09-21 (×14): 81 mg via ORAL
  Filled 2023-09-08 (×14): qty 1

## 2023-09-08 MED ORDER — AMLODIPINE BESYLATE 5 MG PO TABS
5.0000 mg | ORAL_TABLET | Freq: Every day | ORAL | Status: DC
  Administered 2023-09-09 – 2023-09-21 (×13): 5 mg via ORAL
  Filled 2023-09-08 (×13): qty 1

## 2023-09-08 MED ORDER — GADOBUTROL 1 MMOL/ML IV SOLN: 8.0000 mL | Freq: Once | INTRAVENOUS | Status: DC | PRN

## 2023-09-08 MED ORDER — STROKE: EARLY STAGES OF RECOVERY BOOK
Freq: Once | Status: AC
  Filled 2023-09-08: qty 1
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
## 2023-09-08 NOTE — ED Notes (Signed)
 Called [facility] to transport patient to [facility] 3W rm# [id number]

## 2023-09-08 NOTE — Hospital Course (Signed)
 Terry Hays is a 73 y.o. male with medical history significant for HTN, HLD, prostate cancer, diverticulosis who is admitted with right occipital lobe stroke.
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
## 2023-09-08 NOTE — ED Triage Notes (Addendum)
 Pt caox4, ambulatory reporting approx 2 wks ago he had abd pain, diarrhea, dark stool for approx 5 days with minimal PO intake. Pt had telehealth appt and was put on cipro . Pt states diarrhea subsided but abd pain has continued. Pt also reports he has been feeling woozy visual abnormalities and family reports confusion over the past 24-48 hrs.
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
## 2023-09-09 ENCOUNTER — Observation Stay (HOSPITAL_COMMUNITY)

## 2023-09-09 DIAGNOSIS — D649 Anemia, unspecified: Secondary | ICD-10-CM

## 2023-09-09 DIAGNOSIS — H547 Unspecified visual loss: Secondary | ICD-10-CM | POA: Diagnosis not present

## 2023-09-09 DIAGNOSIS — I639 Cerebral infarction, unspecified: Secondary | ICD-10-CM | POA: Insufficient documentation

## 2023-09-09 DIAGNOSIS — I700 Atherosclerosis of aorta: Secondary | ICD-10-CM | POA: Diagnosis not present

## 2023-09-09 DIAGNOSIS — E872 Acidosis, unspecified: Secondary | ICD-10-CM | POA: Diagnosis present

## 2023-09-09 DIAGNOSIS — R4189 Other symptoms and signs involving cognitive functions and awareness: Secondary | ICD-10-CM | POA: Diagnosis not present

## 2023-09-09 DIAGNOSIS — F901 Attention-deficit hyperactivity disorder, predominantly hyperactive type: Secondary | ICD-10-CM | POA: Diagnosis present

## 2023-09-09 DIAGNOSIS — I6389 Other cerebral infarction: Secondary | ICD-10-CM

## 2023-09-09 DIAGNOSIS — K5900 Constipation, unspecified: Secondary | ICD-10-CM | POA: Diagnosis present

## 2023-09-09 DIAGNOSIS — R7303 Prediabetes: Secondary | ICD-10-CM | POA: Diagnosis present

## 2023-09-09 DIAGNOSIS — R410 Disorientation, unspecified: Secondary | ICD-10-CM | POA: Diagnosis present

## 2023-09-09 DIAGNOSIS — F909 Attention-deficit hyperactivity disorder, unspecified type: Secondary | ICD-10-CM | POA: Diagnosis not present

## 2023-09-09 DIAGNOSIS — Z7982 Long term (current) use of aspirin: Secondary | ICD-10-CM | POA: Diagnosis not present

## 2023-09-09 DIAGNOSIS — Z8249 Family history of ischemic heart disease and other diseases of the circulatory system: Secondary | ICD-10-CM | POA: Diagnosis not present

## 2023-09-09 DIAGNOSIS — I63531 Cerebral infarction due to unspecified occlusion or stenosis of right posterior cerebral artery: Secondary | ICD-10-CM | POA: Diagnosis not present

## 2023-09-09 DIAGNOSIS — E785 Hyperlipidemia, unspecified: Secondary | ICD-10-CM

## 2023-09-09 DIAGNOSIS — Z8546 Personal history of malignant neoplasm of prostate: Secondary | ICD-10-CM | POA: Diagnosis not present

## 2023-09-09 DIAGNOSIS — Z79899 Other long term (current) drug therapy: Secondary | ICD-10-CM | POA: Diagnosis not present

## 2023-09-09 DIAGNOSIS — H53462 Homonymous bilateral field defects, left side: Secondary | ICD-10-CM | POA: Diagnosis present

## 2023-09-09 DIAGNOSIS — I63431 Cerebral infarction due to embolism of right posterior cerebral artery: Secondary | ICD-10-CM

## 2023-09-09 DIAGNOSIS — R911 Solitary pulmonary nodule: Secondary | ICD-10-CM

## 2023-09-09 DIAGNOSIS — I69391 Dysphagia following cerebral infarction: Secondary | ICD-10-CM

## 2023-09-09 DIAGNOSIS — R29702 NIHSS score 2: Secondary | ICD-10-CM | POA: Diagnosis present

## 2023-09-09 DIAGNOSIS — R918 Other nonspecific abnormal finding of lung field: Secondary | ICD-10-CM | POA: Diagnosis not present

## 2023-09-09 DIAGNOSIS — I10 Essential (primary) hypertension: Secondary | ICD-10-CM | POA: Diagnosis present

## 2023-09-09 DIAGNOSIS — H5462 Unqualified visual loss, left eye, normal vision right eye: Secondary | ICD-10-CM | POA: Diagnosis present

## 2023-09-09 DIAGNOSIS — H5340 Unspecified visual field defects: Secondary | ICD-10-CM | POA: Diagnosis present

## 2023-09-09 DIAGNOSIS — Z8673 Personal history of transient ischemic attack (TIA), and cerebral infarction without residual deficits: Secondary | ICD-10-CM | POA: Diagnosis not present

## 2023-09-09 DIAGNOSIS — Z8719 Personal history of other diseases of the digestive system: Secondary | ICD-10-CM | POA: Diagnosis not present

## 2023-09-09 DIAGNOSIS — F17290 Nicotine dependence, other tobacco product, uncomplicated: Secondary | ICD-10-CM | POA: Diagnosis present

## 2023-09-09 DIAGNOSIS — F429 Obsessive-compulsive disorder, unspecified: Secondary | ICD-10-CM | POA: Diagnosis present

## 2023-09-09 DIAGNOSIS — E878 Other disorders of electrolyte and fluid balance, not elsewhere classified: Secondary | ICD-10-CM | POA: Diagnosis present

## 2023-09-09 DIAGNOSIS — Z751 Person awaiting admission to adequate facility elsewhere: Secondary | ICD-10-CM | POA: Diagnosis not present

## 2023-09-09 DIAGNOSIS — Z5986 Financial insecurity: Secondary | ICD-10-CM | POA: Diagnosis not present

## 2023-09-09 DIAGNOSIS — R1310 Dysphagia, unspecified: Secondary | ICD-10-CM | POA: Diagnosis present

## 2023-09-09 LAB — ECHOCARDIOGRAM COMPLETE
Area-P 1/2: 4.54 cm2
Calc EF: 63 %
Height: 68 in
S' Lateral: 2.8 cm
Single Plane A2C EF: 60.7 %
Single Plane A4C EF: 61.4 %
Weight: 2928 [oz_av]

## 2023-09-09 LAB — LIPID PANEL
Cholesterol: 144 mg/dL (ref 0–200)
HDL: 44 mg/dL (ref >40)
LDL Cholesterol: 71 mg/dL (ref 0–99)
Total CHOL/HDL Ratio: 3.3 ratio
Triglycerides: 145 mg/dL (ref <150)
VLDL: 29 mg/dL (ref 0–40)

## 2023-09-09 LAB — BASIC METABOLIC PANEL WITH GFR
Anion gap: 8 (ref 5–15)
BUN: 14 mg/dL (ref 8–23)
CO2: 22 mmol/L (ref 22–32)
Calcium: 8.5 mg/dL — ABNORMAL LOW (ref 8.9–10.3)
Chloride: 106 mmol/L (ref 98–111)
Creatinine, Ser: 1.05 mg/dL (ref 0.61–1.24)
GFR, Estimated: >60 mL/min (ref >60)
Glucose, Bld: 110 mg/dL — ABNORMAL HIGH (ref 70–99)
Potassium: 3.8 mmol/L (ref 3.5–5.1)
Sodium: 136 mmol/L (ref 135–145)

## 2023-09-09 LAB — CBC
HCT: 28.6 % — ABNORMAL LOW (ref 39.0–52.0)
Hemoglobin: 9.2 g/dL — ABNORMAL LOW (ref 13.0–17.0)
MCH: 31 pg (ref 26.0–34.0)
MCHC: 32.2 g/dL (ref 30.0–36.0)
MCV: 96.3 fL (ref 80.0–100.0)
Platelets: 291 10*3/uL (ref 150–400)
RBC: 2.97 MIL/uL — ABNORMAL LOW (ref 4.22–5.81)
RDW: 15.6 % — ABNORMAL HIGH (ref 11.5–15.5)
WBC: 6.6 10*3/uL (ref 4.0–10.5)
nRBC: 0 % (ref 0.0–0.2)

## 2023-09-09 LAB — GLUCOSE, CAPILLARY
Glucose-Capillary: 135 mg/dL — ABNORMAL HIGH (ref 70–99)
Glucose-Capillary: 156 mg/dL — ABNORMAL HIGH (ref 70–99)
Glucose-Capillary: 129 mg/dL — ABNORMAL HIGH (ref 70–99)

## 2023-09-09 MED ORDER — PERFLUTREN LIPID MICROSPHERE
1.0000 mL | INTRAVENOUS | Status: AC | PRN
  Administered 2023-09-09: 5 mL via INTRAVENOUS
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
## 2023-09-09 NOTE — Assessment & Plan Note (Addendum)
-   Family reports difficulty delineating confusion from how he would usually act in regards to impulsivity and blend of his ADHD/OCD tendencies; however, left vision changes are certainly new along with difficulty driving recently due to this - CT angio head/neck was performed which showed occlusion of the P2 P segment of right PCA with associated infarct appearing acute to subacute.  Also noted to have remote lacunar infarct of left thalamus -MRI brain also obtained and showed moderate to large acute/subacute right PCA territory infarct largely involving medial right occipital lobe.  Also noted to have chronic infarcts of left cerebellum -Main deficits appear to be left visual changes and possibly some cognitive impairment.  He does have old strokes noted on imaging involving left cerebellum and left thalamus -Some concern for central embolic phenomenon given bilateral strokes over the years - echo negative for clot and atrial septum grossly normal -Loop recorder recommended.  Tentative plan for placement per EP - follow up PT/OT/SLP evals; would benefit from CIR as well  - LDL 71, continue statin - J8r 5.5% - continue DAPT for 21 days then mono asa per neuro; will need to monitor him for signs/symptoms of GIB as well (tolerating well currently)

## 2023-09-09 NOTE — Assessment & Plan Note (Signed)
-   LDL 71 - Continue statin

## 2023-09-09 NOTE — Plan of Care (Signed)

## 2023-09-09 NOTE — Care Management Obs Status (Signed)
 MEDICARE OBSERVATION STATUS NOTIFICATION   Patient Details  Name: Terry Hays MRN: [id number] Date of Birth: June 05, 1950   Medicare Observation Status Notification Given:  Yes  Letter signed cop[y given  Claretta Deed 09/09/2023, 11:27 AM

## 2023-09-09 NOTE — Assessment & Plan Note (Addendum)
-   Non-smoker currently but does have prior tobacco use history.  States he smoked from about 73 years old until 73 years old and barely 1 PPD during that time. - By definition, low risk in setting of < 20 pack year history - Nodule noted on CT A/P and further evaluation obtained with formal CT chest.  2 small nodules noted in the right lower lobe measuring 3 mm and 4 mm - Criteria would be no further imaging in setting of low risk however patient still prefers repeat scan in about 1 year.  This can be performed with primary care - Findings of CT chest reviewed with patient and wife in person on 09/13/2023

## 2023-09-09 NOTE — Progress Notes (Signed)
   09/09/23 1500  Spiritual Encounters  Type of Visit Initial  Care provided to: Pt and family;Friend  Reason for visit Advance directives  OnCall Visit No  Interventions  Spiritual Care Interventions Made Established relationship of care and support;Compassionate presence;Decision-making support/facilitation  Intervention Outcomes  Outcomes Connection to spiritual care;Awareness around self/spiritual resourses;Connection to values and goals of care;Patient family open to resources    Chaplain responded to spiritual consult request for AD. Pt's daughter present. Paperwork and information provided. Chaplains remain available.

## 2023-09-09 NOTE — TOC Initial Note (Signed)
 Transition of Care [facility]) - Initial/Assessment Note    Patient Details  Name: Terry Hays MRN: [id number] Date of Birth: [birth year]  Transition of Care [facility]) CM/SW Contact:    Andrez JULIANNA George, RN Phone Number: 09/09/2023, 1:27 PM  Clinical Narrative:                  Pt is from home with his spouse that is with him most of the time.  Current recommendations are for CIR. Awaiting CIR eval.  TOC following.   Expected Discharge Plan: IP Rehab Facility Barriers to Discharge: Continued Medical Work up   Patient Goals and CMS Choice   CMS Medicare.gov Compare Post Acute Care list provided to:: Patient Choice offered to / list presented to : Spouse, Adult Children, Patient      Expected Discharge Plan and Services   Discharge Planning Services: CM Consult Post Acute Care Choice: IP Rehab Living arrangements for the past 2 months: Single Family Home                                      Prior Living Arrangements/Services Living arrangements for the past 2 months: Single Family Home Lives with:: Spouse Patient language and need for interpreter reviewed:: Yes Do you feel safe going back to the place where you live?: Yes        Care giver support system in place?: Yes (comment)   Criminal Activity/Legal Involvement Pertinent to Current Situation/Hospitalization: No - Comment as needed  Activities of Daily Living   ADL Screening (condition at time of admission) Independently performs ADLs?: No Does the patient have a NEW difficulty with bathing/dressing/toileting/self-feeding that is expected to last >3 days?: No Does the patient have a NEW difficulty with getting in/out of bed, walking, or climbing stairs that is expected to last >3 days?: No Does the patient have a NEW difficulty with communication that is expected to last >3 days?: No Is the patient deaf or have difficulty hearing?: No Does the patient have difficulty seeing, even when wearing  glasses/contacts?: Yes (out of L eye) Does the patient have difficulty concentrating, remembering, or making decisions?: Yes (difficulty remembering per family)  Permission Sought/Granted                  Emotional Assessment Appearance:: Appears stated age Attitude/Demeanor/Rapport: Engaged Affect (typically observed): Accepting Orientation: : Oriented to Self, Oriented to Place, Oriented to  Time, Oriented to Situation   Psych Involvement: No (comment)  Admission diagnosis:  Confusion [R41.0] Loss of peripheral visual field, left [H53.452] Abdominal pain, unspecified abdominal location [R10.9] Cerebrovascular accident (CVA), unspecified mechanism (HCC) [I63.9] Anemia, unspecified type [D64.9] Acute cerebrovascular accident (CVA) due to occlusion of right posterior cerebral artery (HCC) [I63.531] Acute CVA (cerebrovascular accident) [facility]) [I63.9] Patient Active Problem List   Diagnosis Date Noted   Acute CVA (cerebrovascular accident) (HCC) 09/09/2023   Acute cerebrovascular accident (CVA) due to occlusion of right posterior cerebral artery (HCC) 09/08/2023   Normocytic anemia 09/08/2023   Right lower lobe pulmonary nodule 09/08/2023   Attention deficit hyperactivity disorder (ADHD), predominantly hyperactive type 06/03/2021   Cherry angioma 06/03/2021   Seborrheic keratosis 06/03/2021   Arthritis 03/17/2018   Spinal stenosis of lumbosacral region 03/17/2018   Actinic keratosis 03/17/2018   Degeneration of lumbar intervertebral disc 10/27/2017   History of prostate cancer 09/15/2011   Fear of flying 09/15/2011   Family history of  heart disease in male family member before age 55 09/15/2011   Allergic rhinitis 09/15/2011   Hemorrhoids 09/15/2011   Hypertension 06/27/2010   Dyslipidemia    LVH (left ventricular hypertrophy)    PCP:  [name], MD Pharmacy:   [facility] [phone] - [place], [place] - [address] [place]  [place] [address] Phone: [phone] Fax: [phone]  [facility] - [place], [place] - [address] [place] [address] Phone: [phone] Fax: [phone]     Social Drivers of Health (SDOH) Social History: SDOH Screenings   Food Insecurity: No Food Insecurity (09/08/2023)  Housing: Low Risk  (09/08/2023)  Transportation Needs: No Transportation Needs (09/08/2023)  Utilities: Not At Risk (09/08/2023)  Depression (PHQ2-9): Low Risk  (09/02/2022)  Financial Resource Strain: Low Risk  (09/02/2022)  Recent Concern: Financial Resource Strain - High Risk (09/02/2022)  Physical Activity: Sufficiently Active (05/30/2021)  Social Connections: Socially Integrated (09/08/2023)  Stress: No Stress Concern Present (09/02/2022)  Tobacco Use: Medium Risk (09/08/2023)   SDOH Interventions:     Readmission Risk Interventions     No data to display

## 2023-09-09 NOTE — Evaluation (Addendum)
 Speech Language Pathology Evaluation Patient Details Name: MARCK MCCLENNY MRN: [id number] DOB: 04/13/1950 Today's Date: 09/09/2023 Time: 1005-1035 SLP Time Calculation (min) (ACUTE ONLY): 30 min  Problem List:  Patient Active Problem List   Diagnosis Date Noted   Acute cerebrovascular accident (CVA) due to occlusion of right posterior cerebral artery (HCC) 09/08/2023   Normocytic anemia 09/08/2023   Right lower lobe pulmonary nodule 09/08/2023   Attention deficit hyperactivity disorder (ADHD), predominantly hyperactive type 06/03/2021   Cherry angioma 06/03/2021   Seborrheic keratosis 06/03/2021   Arthritis 03/17/2018   Spinal stenosis of lumbosacral region 03/17/2018   Actinic keratosis 03/17/2018   Degeneration of lumbar intervertebral disc 10/27/2017   History of prostate cancer 09/15/2011   Fear of flying 09/15/2011   Family history of heart disease in male family member before age 68 09/15/2011   Allergic rhinitis 09/15/2011   Hemorrhoids 09/15/2011   Hypertension 06/27/2010   Dyslipidemia    LVH (left ventricular hypertrophy)    Past Medical History:  Past Medical History:  Diagnosis Date   Abnormal ECG    Allergy    Anxiety    FLYING   Diverticulosis    Dyslipidemia    History of prostate cancer    Hyperlipidemia    Hypertension    LVH (left ventricular hypertrophy)    Pre-diabetes    Past Surgical History:  Past Surgical History:  Procedure Laterality Date   COLONOSCOPY  2004 2011   MAGOD   EYE SURGERY  03/10/1999   CATOACTS BOTH   HERNIA REPAIR  03/09/1952   R INGHINAL   INGUINAL HERNIA REPAIR Left 04/07/2018   Procedure: OPERN LEFT INGUINAL HERNIA REPAIR WITH MESH ERAS PATHWAY;  Surgeon: Tanda Locus, MD;  Location: [facility];  Service: General;  Laterality: Left;   PENILE PROSTHESIS IMPLANT  03/09/2009   DAHLSTEDT   PROSTATE SURGERY  03/09/2001   PROSTRATECTOMY   HPI:  73yo male admitted 09/08/23 with confusion, left visual field cut. PMH: HTN, HLD,  prediabetes, prostate cancer, diverticulosis. MRI - mod-lg (sub)acute right PCA infarct in the medial right occipital lobe. Small chronic infarcts left cerebellum.   Assessment / Plan / Recommendation Clinical Impression  Pt seen for cognitive linguistic evaluation at bedside. Pt was awake and alert. Wife, son, and daughter present during assessment. Pt reports PhD in Sociology, independent with finances and medications prior to admit. Family reports concern regarding decline in attention and working memory prior to admit. Pt speech is fully intelligible. Receptive and Expressive Language are intact. Pt scored 21/30 on the [employer] Mental Status (SLUMS) Examination, indicating mild neurocognitive deficits. Pt exhibited difficulty with delayed recall, attention, mental math (problem solving), auditory attention/recall, and clock drawing (executive functions). Recommend skilled ST intervention at next venue of care to maximize independence and safety and minimize caregiver burden. Pt/family and medical team informed of results and recommendations.    SLP Assessment  SLP Recommendation/Assessment: All further Speech Language Pathology needs can be addressed in the next venue of care SLP Visit Diagnosis: Cognitive communication deficit (R41.841)     Assistance Recommended at Discharge  Intermittent Supervision/Assistance  Functional Status Assessment Patient has had a recent decline in their functional status and demonstrates the ability to make significant improvements in function in a reasonable and predictable amount of time.     SLP Evaluation Cognition  Overall Cognitive Status: History of cognitive impairments - at baseline Arousal/Alertness: Awake/alert Orientation Level: Oriented X4       Comprehension  Auditory Comprehension Overall  Auditory Comprehension: Appears within functional limits for tasks assessed Reading Comprehension Reading Status: Not tested    Expression  Expression Primary Mode of Expression: Verbal Verbal Expression Overall Verbal Expression: Appears within functional limits for tasks assessed Written Expression Dominant Hand: Left Written Expression: Not tested   Oral / Motor  Oral Motor/Sensory Function Overall Oral Motor/Sensory Function: Within functional limits Motor Speech Overall Motor Speech: Appears within functional limits for tasks assessed Intelligibility: Intelligible           Kelcy Baeten B. Dory, MSP, CCC-SLP Speech Language Pathologist Office: [phone]  Dory Caprice Daring 09/09/2023, 10:57 AM

## 2023-09-09 NOTE — Assessment & Plan Note (Signed)
-   Given duration of symptoms, no further need for permissive hypertension - Continue amlodipine  and losartan

## 2023-09-09 NOTE — Progress Notes (Signed)
 Inpatient Rehab Admissions Coordinator:   Per therapy recommendations pt was screened for CIR candidacy by Reche Lowers, PT, DPT.  Note already mobilizing well, requiring no physical assist for basic mobility or ADLs.  Some assist required for 300' of gait without DME.  As we would be unlikely to pursue insurance auth over the holiday weekend, recommend PT/OT continue to progress in the acute setting as he will likely be able to d/c directly home in another few days.  If he fails to progress CIR can rescreen on Monday.   Reche Lowers, PT, DPT Admissions Coordinator [phone] 09/09/23  3:40 PM

## 2023-09-09 NOTE — Consult Note (Incomplete)
 ELECTROPHYSIOLOGY CONSULT NOTE  Patient ID: Terry Hays MRN: [id number], DOB/AGE: 75-Jun-1952   Admit date: 09/08/2023 Date of Consult: 09/09/2023  Primary Physician: Terry Norleen BROCKS, MD Primary Cardiologist: Dr. Alveta (, last in Jan 2024, now ret.) Reason for Consultation: Cryptogenic stroke; recommendations regarding Implantable Loop Recorder, requested by Dr. [name]  History of Present Illness Terry Hays was admitted on 09/08/2023 with confusion, abd pain and some degree of AMS, found with stroke.    PMHx includes: HTN, HLD, LVH, pre-DM  Neurology notes: right occipital lobe infarct s/p Etiology:  likely embolic  .  he has undergone workup for stroke including echocardiogram and carotid angio.  The patient has been monitored on telemetry which has demonstrated sinus rhythm with no arrhythmias.  Neurology has deferred TEE  Echocardiogram this admission demonstrated    1. Left ventricular ejection fraction, by estimation, is 60 to 65%. The  left ventricle has normal function. The left ventricle has no regional  wall motion abnormalities. There is moderate concentric left ventricular  hypertrophy. Left ventricular  diastolic parameters are indeterminate.   2. Right ventricular systolic function is normal. The right ventricular  size is normal. There is normal pulmonary artery systolic pressure.   3. Left atrial size was moderately dilated.   4. The mitral valve is normal in structure. Trivial mitral valve  regurgitation. No evidence of mitral stenosis.   5. The aortic valve is tricuspid. Aortic valve regurgitation is not  visualized. No aortic stenosis is present.   6. The inferior vena cava is normal in size with greater than 50%  respiratory variability, suggesting right atrial pressure of 3 mmHg.   Comparison(s): Prior images unable to be directly viewed, comparison made  by report only.    Lab work is reviewed. VSS/afebrile   Prior to admission, the patient  denies chest pain, shortness of breath, dizziness, palpitations, or syncope.  They are recovering from their stroke with plans to *** at discharge.      Past Medical History:  Diagnosis Date   Abnormal ECG    Allergy    Anxiety    FLYING   Diverticulosis    Dyslipidemia    History of prostate cancer    Hyperlipidemia    Hypertension    LVH (left ventricular hypertrophy)    Pre-diabetes      Surgical History:  Past Surgical History:  Procedure Laterality Date   COLONOSCOPY  2004 2011   MAGOD   EYE SURGERY  03/10/1999   CATOACTS BOTH   HERNIA REPAIR  03/09/1952   R INGHINAL   INGUINAL HERNIA REPAIR Left 04/07/2018   Procedure: OPERN LEFT INGUINAL HERNIA REPAIR WITH MESH ERAS PATHWAY;  Surgeon: Tanda Locus, MD;  Location: [facility];  Service: General;  Laterality: Left;   PENILE PROSTHESIS IMPLANT  03/09/2009   DAHLSTEDT   PROSTATE SURGERY  03/09/2001   PROSTRATECTOMY     Medications Prior to Admission  Medication Sig Dispense Refill Last Dose/Taking   ALPRAZolam  (XANAX ) 0.25 MG tablet TAKE 1 TABLET BY MOUTH AT BEDTIME AS NEEDED FOR ANXIETY (FOR flying) 30 tablet 1 Past Month   amLODipine  (NORVASC ) 5 MG tablet TAKE 1 TABLET BY MOUTH DAILY 90 tablet 3 09/07/2023   aspirin  EC 81 MG tablet Take 1 tablet (81 mg total) by mouth daily. 90 tablet 3 09/07/2023 Morning   atorvastatin  (LIPITOR) 40 MG tablet TAKE 1 TABLET BY MOUTH DAILY 90 tablet 1 09/07/2023   losartan  (COZAAR ) 50 MG tablet TAKE  1 TABLET BY MOUTH DAILY 90 tablet 3 09/07/2023   magnesium gluconate (MAGONATE) 500 MG tablet Take 500 mg by mouth daily.   09/07/2023   potassium chloride  (KLOR-CON ) 10 MEQ tablet TAKE 1 TABLET BY MOUTH ONCE DAILY 90 tablet 3 09/07/2023   TURMERIC PO Take 1,000 mg by mouth daily.   09/07/2023   VYVANSE 10 MG CHEW Chew 1 tablet by mouth as needed.   09/07/2023   VYVANSE 60 MG CHEW Chew by mouth.   09/07/2023 Morning    Inpatient Medications:   amLODipine   5 mg Oral Daily   aspirin  EC  81 mg Oral Daily    atorvastatin   40 mg Oral Daily   clopidogrel  75 mg Oral Daily   losartan   50 mg Oral Daily   pantoprazole  40 mg Oral BID    Allergies:  Allergies  Allergen Reactions   Tramadol Other (See Comments)    hallucinations    Social History   Socioeconomic History   Marital status: Married    Spouse name: Not on file   Number of children: Not on file   Years of education: Not on file   Highest education level: Not on file  Occupational History   Not on file  Tobacco Use   Smoking status: Former    Current packs/day: 0.00    Types: Cigars, Cigarettes    Quit date: 03/09/1976    Years since quitting: 47.5   Smokeless tobacco: Never   Tobacco comments:    Smokes 1 cigar every 7 weeks.  Vaping Use   Vaping status: Never Used  Substance and Sexual Activity   Alcohol use: Yes    Alcohol/week: 1.0 standard drink of alcohol    Types: 1 Glasses of wine per week    Comment: scotch   Drug use: No   Sexual activity: Not on file  Other Topics Concern   Not on file  Social History Narrative   Not on file   Social Drivers of Health   Financial Resource Strain: Low Risk  (09/02/2022)   Overall Financial Resource Strain (CARDIA)    Difficulty of Paying Living Expenses: Not very hard  Recent Concern: Financial Resource Strain - High Risk (09/02/2022)   Overall Financial Resource Strain (CARDIA)    Difficulty of Paying Living Expenses: Very hard  Food Insecurity: No Food Insecurity (09/08/2023)   Hunger Vital Sign    Worried About Running Out of Food in the Last Year: Never true    Ran Out of Food in the Last Year: Never true  Transportation Needs: No Transportation Needs (09/08/2023)   PRAPARE - Administrator, Civil Service (Medical): No    Lack of Transportation (Non-Medical): No  Physical Activity: Sufficiently Active (05/30/2021)   Exercise Vital Sign    Days of Exercise per Week: 7 days    Minutes of Exercise per Session: 90 min  Stress: No Stress Concern Present  (09/02/2022)   [employer] of Occupational Health - Occupational Stress Questionnaire    Feeling of Stress : Only a little  Social Connections: Socially Integrated (09/08/2023)   Social Connection and Isolation Panel    Frequency of Communication with Friends and Family: More than three times a week    Frequency of Social Gatherings with Friends and Family: More than three times a week    Attends Religious Services: More than 4 times per year    Active Member of [employer] or Organizations: Yes    Attends [employer]  or Organization Meetings: More than 4 times per year    Marital Status: Married  Catering manager Violence: Not At Risk (09/08/2023)   Humiliation, Afraid, Rape, and Kick questionnaire    Fear of Current or Ex-Partner: No    Emotionally Abused: No    Physically Abused: No    Sexually Abused: No     Family History  Problem Relation Age of Onset   Heart attack Other    Hyperlipidemia Other    Cancer Other       Review of Systems: All other systems reviewed and are otherwise negative except as noted above.  Physical Exam: Vitals:   09/08/23 1802 09/08/23 1803 09/08/23 2346 09/09/23 0728  BP:   (!) 138/58 (!) 127/50  Pulse:   72 62  Resp:   18 16  Temp:   98.8 F (37.1 C) 98.5 F (36.9 C)  TempSrc: Oral Oral Oral Oral  SpO2:   98% 97%  Weight:      Height:        *** GEN- The patient is well appearing, alert and oriented x 3 today.   Head- normocephalic, atraumatic Eyes-  Sclera clear, conjunctiva pink Ears- hearing intact Oropharynx- clear Neck- supple Lungs- *** CTA b/l, normal work of breathing Heart- *** RRR, no murmurs, rubs or gallops  GI- soft, NT, ND Extremities- no clubbing, cyanosis, or edema MS- no significant deformity or atrophy Skin- no rash or lesion Psych- euthymic mood, full affect   Labs:   Lab Results  Component Value Date   WBC 6.6 09/09/2023   HGB 9.2 (L) 09/09/2023   HCT 28.6 (L) 09/09/2023   MCV 96.3 09/09/2023   PLT 291  09/09/2023    Recent Labs  Lab 09/08/23 1215 09/09/23 0429  NA 140 136  K 4.1 3.8  CL 106 106  CO2 23 22  BUN 17 14  CREATININE 1.11 1.05  CALCIUM  9.3 8.5*  PROT 6.9  --   BILITOT 0.5  --   ALKPHOS 88  --   ALT 37  --   AST 24  --   GLUCOSE 196* 110*   No results found for: CKTOTAL, CKMB, CKMBINDEX, TROPONINI Lab Results  Component Value Date   CHOL 144 09/09/2023   CHOL 168 09/02/2022   CHOL 176 06/03/2021   Lab Results  Component Value Date   HDL 44 09/09/2023   HDL 71 09/02/2022   HDL 61 06/03/2021   Lab Results  Component Value Date   LDLCALC 71 09/09/2023   LDLCALC 78 09/02/2022   LDLCALC 88 06/03/2021   Lab Results  Component Value Date   TRIG 145 09/09/2023   TRIG 108 09/02/2022   TRIG 159 (H) 06/03/2021   Lab Results  Component Value Date   CHOLHDL 3.3 09/09/2023   CHOLHDL 2.4 09/02/2022   CHOLHDL 2.9 06/03/2021   No results found for: LDLDIRECT  No results found for: DDIMER   Radiology/Studies:  MR Brain W and Wo Contrast Result Date: 09/08/2023 EXAM DESCRIPTION: MR BRAIN W WO CONTRAST CLINICAL HISTORY: Neuro deficit, acute, stroke suspected COMPARISON: None available TECHNIQUE: Non contrast MRI of the brain is performed according to our usual protocol including multiplanar multi sequence technique. FINDINGS: There is a moderate to large acute/subacute infarct in the right PCA territory involving the medial right occipital lobe and portions of the posterior right temporal lobe. No other acute intracranial hemorrhage, mass, edema, or hydrocephalus. No areas of abnormal enhancement. Mild cortical atrophy. Moderate white matter disease  suggesting chronic small vessel ischemic change. Small chronic infarct in the left cerebellum. The vascular flow voids are unremarkable. No significant sinus disease. IMPRESSION: Moderate to large acute/subacute right PCA territory infarct largely involving the medial right occipital lobe. Small chronic infarcts  left cerebellum. Age-related change. Electronically signed by: Reyes Frees MD 09/08/2023 09:28 PM EDT RP Workstation: [id number]   CT ANGIO HEAD NECK W WO CM Result Date: 09/08/2023 CLINICAL DATA:  Neuro deficit, concern for stroke, left eye lateral vision cut. Headaches, carotid bruit. EXAM: CT ANGIOGRAPHY HEAD AND NECK WITH AND WITHOUT CONTRAST TECHNIQUE: Multidetector CT imaging of the head and neck was performed using the standard protocol during bolus administration of intravenous contrast. Multiplanar CT image reconstructions and MIPs were obtained to evaluate the vascular anatomy. Carotid stenosis measurements (when applicable) are obtained utilizing NASCET criteria, using the distal internal carotid diameter as the denominator. RADIATION DOSE REDUCTION: This exam was performed according to the departmental dose-optimization program which includes automated exposure control, adjustment of the mA and/or kV according to patient size and/or use of iterative reconstruction technique. CONTRAST:  85mL OMNIPAQUE IOHEXOL 350 MG/ML SOLN COMPARISON:  None Available. FINDINGS: CT HEAD FINDINGS Brain: No acute intracranial hemorrhage. Hypoattenuation throughout the right PCA territory with loss of gray-white differentiation suggestive of subacute infarct. Additional subtle areas of hyperattenuation within the posterior aspect of the infarct which may reflect areas of petechial hemorrhage versus small focus of relatively spared cortex. Mild local edema. No midline shift. Nonspecific hypoattenuation in the periventricular and subcortical white matter favored to reflect chronic microvascular ischemic changes. Remote lacunar infarct in the left thalamus. Ventricles: There is mass effect and mild effacement of the atrium of the right lateral ventricle. Vascular: No hyperdense vessel. Intracranial atherosclerotic calcifications noted. Skull: No acute or aggressive finding. Sinuses/orbits: Bilateral lens replacement. Mild  mucosal thickening in the ethmoid sinuses. Other: Mastoid air cells are clear. CTA NECK FINDINGS Aortic arch: Standard configuration of the aortic arch. Imaged portion shows no evidence of aneurysm or dissection. No significant stenosis of the major arch vessel origins. Pulmonary arteries: As permitted by contrast timing, there are no filling defects in the visualized pulmonary arteries. Subclavian arteries: Patent bilaterally. Mild atherosclerosis along the proximal aspect of both subclavian arteries. Right carotid system: Patent from the origin to the skull base. Slightly limited evaluation of the proximal common carotid artery due to streak artifact. Calcified atherosclerosis at the carotid bifurcation without hemodynamically significant stenosis. Tortuosity of the distal cervical ICA. Left carotid system: Patent from the origin to the skull base. Mild atherosclerosis of the common carotid artery. Additional calcified atherosclerosis at the carotid bifurcation without hemodynamically significant stenosis. Vertebral arteries: Left vertebral artery is dominant. Streak artifact limits evaluation of the right vertebral artery origin. Visualized portions of the vertebral arteries are patent to the vertebrobasilar confluence. No evidence of dissection, stenosis (50% or greater), or occlusion. Atherosclerosis of the left V4 segment resulting in mild stenosis. Skeleton: No acute findings. Degenerative changes in the cervical spine. Other neck: The visualized airway is patent. No cervical lymphadenopathy. Upper chest: Visualized lung apices are clear. Review of the MIP images confirms the above findings CTA HEAD FINDINGS ANTERIOR CIRCULATION: The intracranial ICAs are patent bilaterally. Atherosclerosis of the carotid siphons resulting in mild stenosis of both cavernous ICAs. No high-grade stenosis, proximal occlusion, aneurysm, or vascular malformation. MCAs: The middle cerebral arteries are patent bilaterally. ACAs: The  anterior cerebral arteries are patent bilaterally. POSTERIOR CIRCULATION: No significant stenosis, proximal occlusion, aneurysm, or vascular malformation. PCAs:  The right P1 and P2a segments are patent. There is abrupt cut off of the right P2 P segment. The left PCA is patent. Pcomm: Not well visualized. SCAs: The superior cerebellar arteries are patent bilaterally. Basilar artery: Patent AICAs: Not well visualized. PICAs: Patent Vertebral arteries: As above. Venous sinuses: As permitted by contrast timing, patent. Anatomic variants: None Review of the MIP images confirms the above findings IMPRESSION: Occlusion of the P2P segment of the right PCA. Associated right PCA territory infarct which is late acute to subacute in appearance. Small focus of relative hyperattenuation within the posterior aspect of the infarcted territory which may reflect a region of petechial hemorrhage versus relatively spared cortex. Consider MRI for further evaluation. Chronic microvascular ischemic changes. Remote lacunar infarct in the left thalamus. Multifocal atherosclerosis as above. No hemodynamically significant stenosis in the neck. Mild stenosis of the carotid siphons and V4 segment left vertebral artery. These results were called by telephone at the time of interpretation on 09/08/2023 at 1:31 pm to provider Dr. Matthews, who verbally acknowledged these results. Electronically Signed   By: Donnice Mania M.D.   On: 09/08/2023 13:53   CT ABDOMEN PELVIS W CONTRAST Result Date: 09/08/2023 CLINICAL DATA:  Abdominal pain, acute, nonlocalized and dark stools receltnyl EXAM: CT ABDOMEN AND PELVIS WITH CONTRAST TECHNIQUE: Multidetector CT imaging of the abdomen and pelvis was performed using the standard protocol following bolus administration of intravenous contrast. RADIATION DOSE REDUCTION: This exam was performed according to the departmental dose-optimization program which includes automated exposure control, adjustment of the mA and/or  kV according to patient size and/or use of iterative reconstruction technique. CONTRAST:  85mL OMNIPAQUE IOHEXOL 350 MG/ML SOLN COMPARISON:  None Available. FINDINGS: Lower chest: The lung bases are clear. No pleural effusion. The heart is normal in size. No pericardial effusion. There is a 3 x 5 mm solid noncalcified nodule in the right lung lower lobe. There are several punctate calcified granulomas in the left lower lobe. There are coronary artery atherosclerotic calcifications, in keeping with coronary artery disease. Hepatobiliary: The liver is normal in size. Non-cirrhotic configuration. No suspicious mass. These is mild diffuse hepatic steatosis. No intrahepatic or extrahepatic bile duct dilation. No calcified gallstones. Normal gallbladder wall thickness. No pericholecystic inflammatory changes. Pancreas: Unremarkable. No pancreatic ductal dilatation or surrounding inflammatory changes. Spleen: Within normal limits. No focal lesion. Adrenals/Urinary Tract: Adrenal glands are unremarkable. No suspicious renal mass. There is a 4.7 x 5.1 cm simple cyst arising from the left kidney lower pole. There are additional several subcentimeter hypoattenuating foci in bilateral kidneys, which are too small to adequately characterize. No hydroureteronephrosis or nephroureterolithiasis on either side. Urinary bladder is under distended, precluding optimal assessment. However, no large mass or stones identified. No perivesical fat stranding. Stomach/Bowel: No disproportionate dilation of the small or large bowel loops. No evidence of abnormal bowel wall thickening or inflammatory changes. The appendix is unremarkable. There are multiple diverticula mainly in the sigmoid colon, without imaging signs of diverticulitis. Vascular/Lymphatic: No ascites or pneumoperitoneum. No abdominal or pelvic lymphadenopathy, by size criteria. No aneurysmal dilation of the major abdominal arteries. There are moderate peripheral  atherosclerotic vascular calcifications of the aorta and its major branches. Reproductive: Small/atrophic versus surgically absent prostate gland. Penile implant noted. Other: There is a tiny umbilical hernia containing portion of unobstructed small-bowel. The soft tissues and abdominal wall are otherwise unremarkable. Musculoskeletal: No suspicious osseous lesions. There are moderate multilevel degenerative changes in the visualized spine. IMPRESSION: 1. No acute inflammatory process identified  within the abdomen or pelvis. 2. Multiple other nonacute observations, as described above. 3. There is a 3 x 5 mm solid noncalcified nodule in the right lung lower lobe. Please see follow-up recommendations below. Aortic Atherosclerosis (ICD10-I70.0). Pulmonary nodule follow-up recommendation: Solid lung nodule measuring up to 6 mm: - LOW RISK: No routine follow-up. - HIGHER RISK: Optional CT at 12 months. Recommendations for evaluation of incidental nodules derived from guidelines developed by the Fleischner Society ( Radiology 2017; [phone]). These guidelines apply to incidental nodules, which can be managed according to the specific recommendations. These guidelines do not apply to patient's younger than 35 years, immunocompromised patients, or patients with cancer. For lung cancer screening, adherence to the existing [employer] of radiology lung CT Screening Reporting and Data System (lung-RADS) guidelines is recommended. High risk factors include older age, heavy smoking, larger nodule size, irregular or spiculated margins and upper lobe location. Clinical risk factors include smoking, exposure to other carcinogens, emphysema, fibrosis, upper lobe location, family history of lung cancer, age and sex. Electronically Signed   By: Ree Molt M.D.   On: 09/08/2023 13:32   DG Chest Portable 1 View Result Date: 09/08/2023 CLINICAL DATA:  AMS EXAM: PORTABLE CHEST - 1 VIEW COMPARISON:  February 18, 2007  FINDINGS: No focal airspace consolidation, pleural effusion, or pneumothorax. Mild cardiomegaly.No acute fracture or destructive lesion. Bilateral AC joint osteoarthritis. IMPRESSION: No acute cardiopulmonary abnormality. Electronically Signed   By: Rogelia Myers M.D.   On: 09/08/2023 12:18    12-lead ECG  All prior EKG's in EPIC reviewed with no documented atrial fibrillation  Telemetry ***  Assessment and Plan:  1. Cryptogenic stroke The patient presents with cryptogenic stroke. *** I spoke at length with the patient about monitoring for afib with either a 30 day event monitor or an implantable loop recorder.  Risks, benefits, and alteratives to implantable loop recorder were discussed with the patient today.   At this time, the patient is very clear in their decision to proceed with implantable loop recorder.   *** Wound care was reviewed with the patient (keep incision clean and dry for 3 days).  Wound check follow up will be scheduled for the patient.  Please call with questions.   Hilbert Briggs Macario Arthur, PA-C 09/09/2023

## 2023-09-09 NOTE — Evaluation (Signed)
 Occupational Therapy Evaluation Patient Details Name: Terry Hays MRN: [id number] DOB: May 27, 1950 Today's Date: 09/09/2023   History of Present Illness   Pt is a 73 yo male presenting to [facility] on 09/08/23 for confusion and L visual field deficits. MRI demonstrated R occipital lobe infarct. PMH of HLD, HTN, prediabetes, LVH     Clinical Impressions Pt admitted for above, PTA pt reports being ind with his ADLs/iADLs, still driving and being very active with his grandkids. Pt currently presenting with L homonymous hemianopsia impacting his general safety awareness and balance. With OT pt maintaining a slow pace of gait with CGA no AD, likely cautious of visual deficits. Pt able to complete ADLs with CGA to setup A but needs cues for visual compensation, educated him on strategies to compensate for visual deficits. OT to continue following pt acutely to address listed deficits and progress pt as able. Patient has the potential to reach Mod I and demos the ability to tolerate 3 hours of therapy. Pt would benefit from an intensive rehab program to help maximize functional independence.      If plan is discharge home, recommend the following:   Assist for transportation;Assistance with cooking/housework     Functional Status Assessment   Patient has had a recent decline in their functional status and/or demonstrates limited ability to make significant improvements in function in a reasonable and predictable amount of time     Equipment Recommendations   None recommended by OT     Recommendations for Other Services         Precautions/Restrictions   Precautions Precautions: Fall Recall of Precautions/Restrictions: Intact Restrictions Weight Bearing Restrictions Per Provider Order: No     Mobility Bed Mobility Overal bed mobility: Needs Assistance Bed Mobility: Supine to Sit, Sit to Supine     Supine to sit: Supervision Sit to supine: Supervision         Transfers Overall transfer level: Needs assistance Equipment used: None Transfers: Sit to/from Stand Sit to Stand: Contact guard assist                  Balance Overall balance assessment: Needs assistance Sitting-balance support: No upper extremity supported, Feet supported Sitting balance-Leahy Scale: Good     Standing balance support: During functional activity Standing balance-Leahy Scale: Fair Standing balance comment: no AD; slow pace                           ADL either performed or assessed with clinical judgement   ADL Overall ADL's : Needs assistance/impaired Eating/Feeding: Sitting;Set up   Grooming: Standing;Contact guard assist;Cueing for compensatory techniques Grooming Details (indicate cue type and reason): cues for visual compensation Upper Body Bathing: Sitting;Set up   Lower Body Bathing: Set up;Sitting/lateral leans   Upper Body Dressing : Sitting;Set up   Lower Body Dressing: Set up;Sitting/lateral leans Lower Body Dressing Details (indicate cue type and reason): doff/don socks Toilet Transfer: Contact guard assist;Ambulation   Toileting- Clothing Manipulation and Hygiene: Contact guard assist;Sit to/from stand       Functional mobility during ADLs: Contact guard assist General ADL Comments: placed 4/4 objects around room having pt locate all 4 objects using his visual compensation strategies. Had pt pickup 4/4 objects from floor during ambulation L<>R fields     Vision Baseline Vision/History: 0 No visual deficits Ability to See in Adequate Light: 1 Impaired Patient Visual Report: No change from baseline Vision Assessment?: Yes Eye Alignment: Within Functional  Limits Ocular Range of Motion: Within Functional Limits Alignment/Gaze Preference: Within Defined Limits Tracking/Visual Pursuits: Able to track stimulus in all quads without difficulty Saccades: Additional eye shifts occurred during testing Convergence: Within  functional limits Visual Fields: Left homonymous hemianopsia Diplopia Assessment:  (n.a) Additional Comments: Edcuated pt on visual scanning and anchoring techniques to compensate for visual deficits. Pt noted to be good compensator during visual testing.     Perception Perception: Impaired Preception Impairment Details: Figure ground     Praxis Praxis: WFL       Pertinent Vitals/Pain Pain Assessment Pain Assessment: No/denies pain     Extremity/Trunk Assessment Upper Extremity Assessment Upper Extremity Assessment: Overall WFL for tasks assessed   Lower Extremity Assessment Lower Extremity Assessment: Overall WFL for tasks assessed;Defer to PT evaluation       Communication Communication Communication: No apparent difficulties   Cognition Arousal: Alert Behavior During Therapy: [facility] for tasks assessed/performed Cognition: No apparent impairments                               Following commands: Intact       Cueing  General Comments   Cueing Techniques: Verbal cues  Pt family and present, helping tease out pt's habits of compensation.   Exercises     Shoulder Instructions      Home Living Family/patient expects to be discharged to:: Private residence Living Arrangements: Spouse/significant other Available Help at Discharge: Family;Available 24 hours/day Type of Home: House Home Access: Stairs to enter [employer] of Steps: 6 Entrance Stairs-Rails: Left;Right;Can reach both Home Layout: Multi-level;Bed/bath upstairs Alternate Level Stairs-Number of Steps: 14 steps to upstairs, 10 downstairs Alternate Level Stairs-Rails: Left;Right Bathroom Shower/Tub: Chief Strategy Officer: Standard Bathroom Accessibility: No   Home Equipment: Hand held shower head   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[employer] With: Spouse    Prior Functioning/Environment Prior Level of Function : Independent/Modified Independent;Driving              Mobility Comments: no AD ADLs Comments: ind    OT Problem List: Impaired vision/perception;Impaired balance (sitting and/or standing)   OT Treatment/Interventions: Visual/perceptual remediation/compensation;Patient/family education;Balance training;Therapeutic activities      OT Goals(Current goals can be found in the care plan section)   Acute Rehab OT Goals Patient Stated Goal: To go home OT Goal Formulation: With patient Time For Goal Achievement: 09/23/23 Potential to Achieve Goals: Good ADL Goals Additional ADL Goal #1: Pt will demonstrate independent use of visual compensation strategies to locate 4/4 objects in L visual field Additional ADL Goal #2: Pt will verbalize understanding of fall prevention education.   OT Frequency:  Min 2X/week    Co-evaluation              AM-PAC OT 6 Clicks Daily Activity     Outcome Measure Help from another person eating meals?: None Help from another person taking care of personal grooming?: A Little Help from another person toileting, which includes using toliet, bedpan, or urinal?: A Little Help from another person bathing (including washing, rinsing, drying)?: A Little Help from another person to put on and taking off regular upper body clothing?: A Little Help from another person to put on and taking off regular lower body clothing?: A Little 6 Click Score: 19   End of Session Equipment Utilized During Treatment: Gait belt Nurse Communication: Mobility status  Activity Tolerance: Patient tolerated treatment well Patient left: in  bed;with call bell/phone within reach  OT Visit Diagnosis: Low vision, both eyes (H54.2)                Time: 9156-9047 OT Time Calculation (min): 69 min Charges:  OT General Charges $OT Visit: 1 Visit OT Evaluation $OT Eval Moderate Complexity: 1 Mod OT Treatments $Self Care/Home Management : 8-22 mins $Therapeutic Activity: 23-37 mins  09/09/2023  AB, OTR/L  Acute  Rehabilitation Services  Office: [phone]   Curtistine JONETTA Das 09/09/2023, 12:20 PM

## 2023-09-09 NOTE — Progress Notes (Signed)
 PT Cancellation Note  Patient Details Name: ENNIO HOUP MRN: [id number] DOB: 1950-05-24   Cancelled Treatment:    Reason Eval/Treat Not Completed: Other (comment) (Providers in room upon arrival. Acute PT to re-attempt in the afternoon.)  Zanaiya Calabria W, PT, DPT Secure Chat Preferred  Rehab Office [phone]  [name] BRAVO Wendolyn 09/09/2023, 11:52 AM
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
## 2023-09-09 NOTE — Assessment & Plan Note (Addendum)
-   some concern for potential recent UGIB; his normal Hgb around 1 year ago was 15 to 16 g/dL and he has been having some abdominal pain over the past few weeks with associated dark stools -Hgb on admission was 9.4 g/dL -In setting of acute stroke, he has also been started on DAPT with aspirin  and Plavix .  Discussed extensively bedside with patient and his family.  They will need to look out for further signs and symptoms of GI bleeding which were explained including symptoms such as dizziness, lightheadedness, further change in stools).  He will also need some routine hemoglobin checks over the next few weeks - Hgb remains stable, 9.5 g/dL this morning - Continue twice daily PPI -Can consider referral to GI outpatient as likely not to have indication for inpatient intervention in setting of acute stroke

## 2023-09-09 NOTE — Evaluation (Signed)
 Physical Therapy Evaluation Patient Details Name: Terry Hays MRN: [id number] DOB: 10-12-1950 Today's Date: 09/09/2023  History of Present Illness  Pt is a 73 yo male presenting to [facility] on 09/08/23 for confusion and L visual field deficits. MRI demonstrated R occipital lobe infarct. PMH of HLD, HTN, prediabetes, LVH  Clinical Impression  Pt in bed upon arrival with family present and agreeable to PT eval. PTA pt was independent for mobility with no AD and very active. Pt currently presenting with impaired static and dynamic balance, L homonymous hemianopsia, and impaired coordination. When ambulating with no challenges to balance, pt required MinA for occasional LOB with lateral side-steps to recover. With DGI testing, pt required ModA for multiple LOB with no AD. Pt had increased difficulty with head turns and obstacle negotiation likely due in part to visual deficits. Pt scored a 9/24 on the DGI indicating pt is a high fall risk. Recommending >3hrs post acute rehab to maximize rehab potential and work towards being ModI. Pt has 24/7 light physical assist from wife upon return home. Acute PT to follow.         If plan is discharge home, recommend the following: A lot of help with walking and/or transfers;A little help with bathing/dressing/bathroom;Assistance with cooking/housework;Assist for transportation;Help with stairs or ramp for entrance   Can travel by private vehicle    Yes    Equipment Recommendations Other (comment) (TBA)  Recommendations for Other Services  Rehab consult    Functional Status Assessment Patient has had a recent decline in their functional status and demonstrates the ability to make significant improvements in function in a reasonable and predictable amount of time.     Precautions / Restrictions Precautions Precautions: Fall Recall of Precautions/Restrictions: Intact Precaution/Restrictions Comments: L homonymous hemianopsia Restrictions Weight Bearing  Restrictions Per Provider Order: No      Mobility  Bed Mobility Overal bed mobility: Needs Assistance Bed Mobility: Supine to Sit, Sit to Supine    Supine to sit: Supervision Sit to supine: Supervision   General bed mobility comments: supervision for safety    Transfers Overall transfer level: Needs assistance Equipment used: None Transfers: Sit to/from Stand Sit to Stand: Contact guard assist    General transfer comment: CGA for safety    Ambulation/Gait Ambulation/Gait assistance: Min assist, Mod assist Gait Distance (Feet): 150 Feet (x2) Assistive device: None Gait Pattern/deviations: Staggering left, Staggering right, Narrow base of support, Shuffle Gait velocity: decr     General Gait Details: without challenges to balance, MinA required for occasional LOB with pt staggering L/R. With challenge to balance, ModA to correct.    Modified Rankin (Stroke Patients Only) Modified Rankin (Stroke Patients Only) Pre-Morbid Rankin Score: No symptoms Modified Rankin: Moderately severe disability     Balance Overall balance assessment: Needs assistance Sitting-balance support: No upper extremity supported, Feet supported Sitting balance-Leahy Scale: Good     Standing balance support: No upper extremity supported Standing balance-Leahy Scale: Fair Standing balance comment: able to stand statically with no AD, external support with challenges to balance    Standardized Balance Assessment Standardized Balance Assessment : Dynamic Gait Index   Dynamic Gait Index Level Surface: Mild Impairment Change in Gait Speed: Mild Impairment Gait with Horizontal Head Turns: Severe Impairment Gait with Vertical Head Turns: Severe Impairment Gait and Pivot Turn: Mild Impairment Step Over Obstacle: Severe Impairment Step Around Obstacles: Moderate Impairment Steps: Mild Impairment Total Score: 9       Pertinent Vitals/Pain Pain Assessment Pain Assessment: No/denies pain  Home Living Family/patient expects to be discharged to:: Private residence Living Arrangements: Spouse/significant other Available Help at Discharge: Family;Available 24 hours/day Type of Home: House Home Access: Stairs to enter Entrance Stairs-Rails: Left;Right;Can reach both Entrance Stairs-Number of Steps: 6 Alternate Level Stairs-Number of Steps: 14 steps to upstairs, 10 downstairs Home Layout: Multi-level;Bed/bath upstairs Home Equipment: Hand held shower head 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[facility]    Prior Function Prior Level of Function : Independent/Modified Independent;Driving    Mobility Comments: no AD ADLs Comments: ind     Extremity/Trunk Assessment   Upper Extremity Assessment Upper Extremity Assessment: Defer to OT evaluation    Lower Extremity Assessment Lower Extremity Assessment: RLE deficits/detail;LLE deficits/detail RLE Deficits / Details: Hip flexion 4/5, Knee ext 4+/5, ankle DF 4+/5 RLE Sensation: WNL RLE Coordination: decreased gross motor LLE Deficits / Details: Grossly 4+/5 LLE Sensation: WNL LLE Coordination: decreased gross motor    Cervical / Trunk Assessment Cervical / Trunk Assessment: Normal  Communication   Communication Communication: No apparent difficulties    Cognition Arousal: Alert Behavior During Therapy: WFL for tasks assessed/performed   PT - Cognitive impairments: Attention, Sequencing, Problem solving, Memory    PT - Cognition Comments: Reported having difficulty getting my body to do what my head tells it to do Following commands: Intact       Cueing Cueing Techniques: Verbal cues     General Comments General comments (skin integrity, edema, etc.): Pt family and present, helping tease out pt's habits of compensation.     PT Assessment Patient needs continued PT services  PT Problem List Decreased strength;Decreased activity tolerance;Decreased balance;Decreased mobility;Decreased coordination;Decreased  cognition       PT Treatment Interventions DME instruction;Gait training;Stair training;Functional mobility training;Therapeutic activities;Therapeutic exercise;Balance training;Neuromuscular re-education;Patient/family education    PT Goals (Current goals can be found in the Care Plan section)  Acute Rehab PT Goals Patient Stated Goal: to get back to playing pickleball PT Goal Formulation: With patient/family Time For Goal Achievement: 09/23/23 Potential to Achieve Goals: Good    Frequency Min 3X/week        AM-PAC PT 6 Clicks Mobility  Outcome Measure Help needed turning from your back to your side while in a flat bed without using bedrails?: None Help needed moving from lying on your back to sitting on the side of a flat bed without using bedrails?: A Little Help needed moving to and from a bed to a chair (including a wheelchair)?: A Little Help needed standing up from a chair using your arms (e.g., wheelchair or bedside chair)?: A Little Help needed to walk in hospital room?: A Lot Help needed climbing 3-5 steps with a railing? : A Lot 6 Click Score: 17    End of Session Equipment Utilized During Treatment: Gait belt Activity Tolerance: Patient tolerated treatment well Patient left: in bed;with call bell/phone within reach;with bed alarm set;with family/visitor present Nurse Communication: Mobility status PT Visit Diagnosis: Unsteadiness on feet (R26.81);Other abnormalities of gait and mobility (R26.89);Muscle weakness (generalized) (M62.81)    Time: 8841-8770 PT Time Calculation (min) (ACUTE ONLY): 31 min   Charges:   PT Evaluation $PT Eval Moderate Complexity: 1 Mod PT Treatments $Therapeutic Activity: 8-22 mins PT General Charges $$ ACUTE PT VISIT: 1 Visit       [name] ORN, PT, DPT Secure Chat Preferred  Rehab Office [phone]   [name] BRAVO Wendolyn 09/09/2023, 1:11 PM

## 2023-09-10 DIAGNOSIS — I63531 Cerebral infarction due to unspecified occlusion or stenosis of right posterior cerebral artery: Secondary | ICD-10-CM | POA: Diagnosis not present

## 2023-09-10 DIAGNOSIS — D649 Anemia, unspecified: Secondary | ICD-10-CM | POA: Diagnosis not present

## 2023-09-10 LAB — CBC WITH DIFFERENTIAL/PLATELET
Abs Immature Granulocytes: 0.02 K/uL (ref 0.00–0.07)
Basophils Absolute: 0 K/uL (ref 0.0–0.1)
Basophils Relative: 1 %
Eosinophils Absolute: 0.3 K/uL (ref 0.0–0.5)
Eosinophils Relative: 4 %
HCT: 29.2 % — ABNORMAL LOW (ref 39.0–52.0)
Hemoglobin: 9.5 g/dL — ABNORMAL LOW (ref 13.0–17.0)
Immature Granulocytes: 0 %
Lymphocytes Relative: 23 %
Lymphs Abs: 1.7 K/uL (ref 0.7–4.0)
MCH: 30.9 pg (ref 26.0–34.0)
MCHC: 32.5 g/dL (ref 30.0–36.0)
MCV: 95.1 fL (ref 80.0–100.0)
Monocytes Absolute: 0.7 K/uL (ref 0.1–1.0)
Monocytes Relative: 9 %
Neutro Abs: 4.5 K/uL (ref 1.7–7.7)
Neutrophils Relative %: 63 %
Platelets: 288 K/uL (ref 150–400)
RBC: 3.07 MIL/uL — ABNORMAL LOW (ref 4.22–5.81)
RDW: 15.3 % (ref 11.5–15.5)
WBC: 7.2 K/uL (ref 4.0–10.5)
nRBC: 0 % (ref 0.0–0.2)

## 2023-09-10 LAB — BASIC METABOLIC PANEL WITH GFR
Anion gap: 7 (ref 5–15)
BUN: 14 mg/dL (ref 8–23)
CO2: 20 mmol/L — ABNORMAL LOW (ref 22–32)
Calcium: 8.5 mg/dL — ABNORMAL LOW (ref 8.9–10.3)
Chloride: 110 mmol/L (ref 98–111)
Creatinine, Ser: 1.04 mg/dL (ref 0.61–1.24)
GFR, Estimated: >60 mL/min (ref >60)
Glucose, Bld: 124 mg/dL — ABNORMAL HIGH (ref 70–99)
Potassium: 3.9 mmol/L (ref 3.5–5.1)
Sodium: 137 mmol/L (ref 135–145)

## 2023-09-10 LAB — GLUCOSE, CAPILLARY
Glucose-Capillary: 125 mg/dL — ABNORMAL HIGH (ref 70–99)
Glucose-Capillary: 99 mg/dL (ref 70–99)
Glucose-Capillary: 174 mg/dL — ABNORMAL HIGH (ref 70–99)

## 2023-09-10 LAB — MAGNESIUM: Magnesium: 2.3 mg/dL (ref 1.7–2.4)

## 2023-09-10 LAB — HEMOGLOBIN A1C
Hgb A1c MFr Bld: 5.5 % (ref 4.8–5.6)
Mean Plasma Glucose: 111 mg/dL

## 2023-09-10 NOTE — Progress Notes (Signed)
  Inpatient Rehabilitation Admissions Coordinator   Per family request, I will place rehab consult for full assessment of patient rehab needs.  Heron Leavell, RN, MSN Rehab Admissions Coordinator [phone] 09/10/2023 1:53 PM

## 2023-09-10 NOTE — Plan of Care (Signed)
  Problem: Education: Goal: Knowledge of General Education information will improve Description: Including pain rating scale, medication(s)/side effects and non-pharmacologic comfort measures Outcome: Progressing   Problem: Clinical Measurements: Goal: Ability to maintain clinical measurements within normal limits will improve Outcome: Progressing Goal: Will remain free from infection Outcome: Progressing Goal: Respiratory complications will improve Outcome: Progressing Goal: Cardiovascular complication will be avoided Outcome: Progressing   Problem: Activity: Goal: Risk for activity intolerance will decrease Outcome: Progressing   Problem: Pain Managment: Goal: General experience of comfort will improve and/or be controlled Outcome: Progressing   Problem: Ischemic Stroke/TIA Tissue Perfusion: Goal: Complications of ischemic stroke/TIA will be minimized Outcome: Progressing   Problem: Self-Care: Goal: Ability to communicate needs accurately will improve Outcome: Progressing

## 2023-09-10 NOTE — Progress Notes (Signed)
 CCMD reported patient has 6 beats of VT.   Patient is resting in bed with family visitiing at bedside.Terry Hays

## 2023-09-10 NOTE — Progress Notes (Signed)
 Occupational Therapy Treatment Patient Details Name: Terry Hays MRN: [id number] DOB: [birth year] Today's Date: 09/10/2023   History of present illness Pt is a 73 yo male presenting to [facility] on 09/08/23 for confusion and L visual field deficits. MRI demonstrated R occipital lobe infarct. PMH of HLD, HTN, prediabetes, LVH   OT comments  Continued to reinforce scanning and anchoring strategies during therapeutic activities with pt. He continues to need mild to mod cueing to promote attention to field deficit areas, pt was able to locate prompted objects with increased time. Unsure of how his phone use is going but would be good to ask next session. He continues to need CGA for overall mobility with slow paced gait, very cautious with his vision deficits and remains a moderate fall risk. OT to continue following pt as able and progress closer to ind functioning. Patient has the potential to reach Mod I and demos the ability to tolerate 3 hours of therapy. Pt would benefit from an intensive rehab program to help maximize functional independence.       If plan is discharge home, recommend the following:  Assist for transportation;Assistance with cooking/housework   Equipment Recommendations  None recommended by OT    Recommendations for Other Services      Precautions / Restrictions Precautions Precautions: Fall Recall of Precautions/Restrictions: Intact Precaution/Restrictions Comments: L homonymous hemianopsia Restrictions Weight Bearing Restrictions Per Provider Order: No       Mobility Bed Mobility Overal bed mobility: Needs Assistance Bed Mobility: Supine to Sit, Sit to Supine     Supine to sit: Supervision Sit to supine: Supervision   General bed mobility comments: supervision for safety    Transfers Overall transfer level: Needs assistance Equipment used: None Transfers: Sit to/from Stand Sit to Stand: Contact guard assist                 Balance Overall  balance assessment: Needs assistance Sitting-balance support: No upper extremity supported, Feet supported Sitting balance-Leahy Scale: Good     Standing balance support: No upper extremity supported, Single extremity supported, During functional activity Standing balance-Leahy Scale: Fair                             ADL either performed or assessed with clinical judgement   ADL       Grooming: Wash/dry face;Standing;Contact guard assist           Upper Body Dressing : Sitting;Set up Upper Body Dressing Details (indicate cue type and reason): doff/don new gown     Toilet Transfer: Contact guard assist;Ambulation           Functional mobility during ADLs: Contact guard assist General ADL Comments: Pt locating #[id number] in numerical order then randomized order while standing in front of fall. Forcing scanning of L<>R fields to find #s, increased time needed.    Extremity/Trunk Assessment Upper Extremity Assessment Upper Extremity Assessment: Overall WFL for tasks assessed            Vision   Visual Fields: Left homonymous hemianopsia Additional Comments: Pt has been working on visual scanning and anchoring strategies, focused more on activities to promote strategies today   Perception     Praxis     Communication Communication Communication: No apparent difficulties   Cognition Arousal: Alert Behavior During Therapy: WFL for tasks assessed/performed Cognition: No apparent impairments  Following commands: Intact        Cueing   Cueing Techniques: Verbal cues  Exercises Other Exercises Other Exercises: trailmaking part A.    Shoulder Instructions       General Comments      Pertinent Vitals/ Pain       Pain Assessment Pain Assessment: No/denies pain  Home Living                                          Prior Functioning/Environment              Frequency  Min 2X/week         Progress Toward Goals  OT Goals(current goals can now be found in the care plan section)  Progress towards OT goals: Progressing toward goals  Acute Rehab OT Goals Patient Stated Goal: To go home OT Goal Formulation: With patient Time For Goal Achievement: 09/23/23 Potential to Achieve Goals: Good  Plan      Co-evaluation                 AM-PAC OT 6 Clicks Daily Activity     Outcome Measure   Help from another person eating meals?: None Help from another person taking care of personal grooming?: A Little Help from another person toileting, which includes using toliet, bedpan, or urinal?: A Little Help from another person bathing (including washing, rinsing, drying)?: A Little Help from another person to put on and taking off regular upper body clothing?: A Little Help from another person to put on and taking off regular lower body clothing?: A Little 6 Click Score: 19    End of Session Equipment Utilized During Treatment: Gait belt  OT Visit Diagnosis: Low vision, both eyes (H54.2);Unsteadiness on feet (R26.81);Other abnormalities of gait and mobility (R26.89)   Activity Tolerance Patient tolerated treatment well   Patient Left in bed;with call bell/phone within reach;with bed alarm set;with family/visitor present   Nurse Communication Mobility status        Time: 1130-1207 OT Time Calculation (min): 37 min  Charges: OT General Charges $OT Visit: 1 Visit OT Treatments $Therapeutic Activity: 23-37 mins  09/10/2023  AB, OTR/L  Acute Rehabilitation Services  Office: [phone]   Curtistine JONETTA Das 09/10/2023, 12:56 PM

## 2023-09-10 NOTE — Progress Notes (Signed)
 Physical Therapy Treatment Patient Details Name: Terry Hays MRN: [id number] DOB: [birth year] Today's Date: 09/10/2023   History of Present Illness Pt is a 73 yo male presenting to [facility] on 09/08/23 for confusion and L visual field deficits. MRI demonstrated R occipital lobe infarct. PMH of HLD, HTN, prediabetes, LVH    PT Comments  Pt received in supine and agreeable to session. Pt able to complete gait and stair trials with intermittent min A due to instability from vision impairments. Pt demonstrates decreased strength and endurance requiring standing rest breaks. Pt reports increased difficulty due to vision after stair trial requiring HHA back to the room for stability. Pt's wife reports concerns over pt returning home due to increased fall risk and assist needed. Pt continues to benefit from PT services to progress toward functional mobility goals.     If plan is discharge home, recommend the following: A lot of help with walking and/or transfers;A little help with bathing/dressing/bathroom;Assistance with cooking/housework;Assist for transportation;Help with stairs or ramp for entrance   Can travel by private vehicle        Equipment Recommendations  Other (comment) (TBA)    Recommendations for Other Services       Precautions / Restrictions Precautions Precautions: Fall Recall of Precautions/Restrictions: Intact Precaution/Restrictions Comments: L homonymous hemianopsia Restrictions Weight Bearing Restrictions Per Provider Order: No     Mobility  Bed Mobility Overal bed mobility: Needs Assistance Bed Mobility: Supine to Sit, Sit to Supine     Supine to sit: Supervision Sit to supine: Supervision        Transfers Overall transfer level: Needs assistance Equipment used: None Transfers: Sit to/from Stand Sit to Stand: Contact guard assist                Ambulation/Gait Ambulation/Gait assistance: Contact guard assist, Min assist Gait Distance (Feet): 200  Feet Assistive device: None, 1 person hand held assist Gait Pattern/deviations: Narrow base of support, Decreased stride length, Step-through pattern Gait velocity: decr     General Gait Details: slow gait with mild instability due to vision impairment requiring HHA for second half of trial and intermittent min A   Stairs Stairs: Yes Stairs assistance: Contact guard assist Stair Management: One rail Right, Step to pattern, Alternating pattern Number of Stairs: 12 General stair comments: CGA for safety and cues for technique. Pt's wife educated on guarding technique. Increased instability with descent with improvement with BUE on R rail with sideways technique.   Wheelchair Mobility     Tilt Bed    Modified Rankin (Stroke Patients Only) Modified Rankin (Stroke Patients Only) Pre-Morbid Rankin Score: No symptoms Modified Rankin: Moderately severe disability     Balance Overall balance assessment: Needs assistance Sitting-balance support: No upper extremity supported, Feet supported Sitting balance-Leahy Scale: Good     Standing balance support: No upper extremity supported, Single extremity supported, During functional activity Standing balance-Leahy Scale: Fair Standing balance comment: able to stand statically with no AD, external support with challenges to balance                            Communication Communication Communication: No apparent difficulties  Cognition Arousal: Alert Behavior During Therapy: WFL for tasks assessed/performed   PT - Cognitive impairments: Attention, Sequencing, Problem solving, Memory                         Following commands: Intact  Cueing Cueing Techniques: Verbal cues  Exercises      General Comments        Pertinent Vitals/Pain Pain Assessment Pain Assessment: No/denies pain     PT Goals (current goals can now be found in the care plan section) Acute Rehab PT Goals Patient Stated Goal: to  get back to playing pickleball PT Goal Formulation: With patient/family Time For Goal Achievement: 09/23/23 Progress towards PT goals: Progressing toward goals    Frequency    Min 3X/week       AM-PAC PT 6 Clicks Mobility   Outcome Measure  Help needed turning from your back to your side while in a flat bed without using bedrails?: None Help needed moving from lying on your back to sitting on the side of a flat bed without using bedrails?: A Little Help needed moving to and from a bed to a chair (including a wheelchair)?: A Little Help needed standing up from a chair using your arms (e.g., wheelchair or bedside chair)?: A Little Help needed to walk in hospital room?: A Little Help needed climbing 3-5 steps with a railing? : A Little 6 Click Score: 19    End of Session Equipment Utilized During Treatment: Gait belt Activity Tolerance: Patient tolerated treatment well Patient left: in bed;with call bell/phone within reach;with family/visitor present (with OT) Nurse Communication: Mobility status PT Visit Diagnosis: Unsteadiness on feet (R26.81);Other abnormalities of gait and mobility (R26.89);Muscle weakness (generalized) (M62.81)     Time: 8895-8864 PT Time Calculation (min) (ACUTE ONLY): 31 min  Charges:    $Gait Training: 23-37 mins PT General Charges $$ ACUTE PT VISIT: 1 Visit                     Darryle George, PTA Acute Rehabilitation Services Secure Chat Preferred  [phone]    Darryle George 09/10/2023, 12:21 PM
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
## 2023-09-11 DIAGNOSIS — D649 Anemia, unspecified: Secondary | ICD-10-CM | POA: Diagnosis not present

## 2023-09-11 DIAGNOSIS — I63531 Cerebral infarction due to unspecified occlusion or stenosis of right posterior cerebral artery: Secondary | ICD-10-CM | POA: Diagnosis not present

## 2023-09-11 DIAGNOSIS — F901 Attention-deficit hyperactivity disorder, predominantly hyperactive type: Secondary | ICD-10-CM

## 2023-09-11 LAB — CBC WITH DIFFERENTIAL/PLATELET
Abs Immature Granulocytes: 0.03 K/uL (ref 0.00–0.07)
Basophils Absolute: 0 K/uL (ref 0.0–0.1)
Basophils Relative: 1 %
Eosinophils Absolute: 0.3 K/uL (ref 0.0–0.5)
Eosinophils Relative: 4 %
HCT: 27.8 % — ABNORMAL LOW (ref 39.0–52.0)
Hemoglobin: 9.3 g/dL — ABNORMAL LOW (ref 13.0–17.0)
Immature Granulocytes: 0 %
Lymphocytes Relative: 20 %
Lymphs Abs: 1.5 K/uL (ref 0.7–4.0)
MCH: 31.5 pg (ref 26.0–34.0)
MCHC: 33.5 g/dL (ref 30.0–36.0)
MCV: 94.2 fL (ref 80.0–100.0)
Monocytes Absolute: 0.7 K/uL (ref 0.1–1.0)
Monocytes Relative: 9 %
Neutro Abs: 4.9 K/uL (ref 1.7–7.7)
Neutrophils Relative %: 66 %
Platelets: 270 K/uL (ref 150–400)
RBC: 2.95 MIL/uL — ABNORMAL LOW (ref 4.22–5.81)
RDW: 15.1 % (ref 11.5–15.5)
WBC: 7.3 K/uL (ref 4.0–10.5)
nRBC: 0 % (ref 0.0–0.2)

## 2023-09-11 LAB — GLUCOSE, CAPILLARY
Glucose-Capillary: 123 mg/dL — ABNORMAL HIGH (ref 70–99)
Glucose-Capillary: 152 mg/dL — ABNORMAL HIGH (ref 70–99)
Glucose-Capillary: 162 mg/dL — ABNORMAL HIGH (ref 70–99)

## 2023-09-11 LAB — MAGNESIUM: Magnesium: 2.3 mg/dL (ref 1.7–2.4)

## 2023-09-11 LAB — BASIC METABOLIC PANEL WITH GFR
Anion gap: 8 (ref 5–15)
BUN: 14 mg/dL (ref 8–23)
CO2: 21 mmol/L — ABNORMAL LOW (ref 22–32)
Calcium: 8.8 mg/dL — ABNORMAL LOW (ref 8.9–10.3)
Chloride: 109 mmol/L (ref 98–111)
Creatinine, Ser: 1.05 mg/dL (ref 0.61–1.24)
GFR, Estimated: >60 mL/min (ref >60)
Glucose, Bld: 125 mg/dL — ABNORMAL HIGH (ref 70–99)
Potassium: 3.7 mmol/L (ref 3.5–5.1)
Sodium: 138 mmol/L (ref 135–145)

## 2023-09-11 LAB — FOLATE: Folate: 11.4 ng/mL (ref >5.9)

## 2023-09-11 NOTE — Assessment & Plan Note (Addendum)
-   Extensive discussion held with family bedside on 09/11/2023 regarding his Vyvanse.  They have also spoken with Dr. Sharlet Fuel (prescribing provider) and was recommended to stay off Vyvanse at this time, therefore will not be resumed - dosing was 70 mg daily and he was taking an extra 20 mg approximately every other day if needed still  - Have not witnessed much if any true withdrawal signs at this time

## 2023-09-11 NOTE — Plan of Care (Signed)
  Problem: Education: Goal: Knowledge of General Education information will improve Description: Including pain rating scale, medication(s)/side effects and non-pharmacologic comfort measures Outcome: Progressing   Problem: Clinical Measurements: Goal: Ability to maintain clinical measurements within normal limits will improve Outcome: Progressing Goal: Will remain free from infection Outcome: Progressing Goal: Respiratory complications will improve Outcome: Progressing Goal: Cardiovascular complication will be avoided Outcome: Progressing   Problem: Activity: Goal: Risk for activity intolerance will decrease Outcome: Progressing   Problem: Coping: Goal: Level of anxiety will decrease Outcome: Progressing   Problem: Elimination: Goal: Will not experience complications related to urinary retention Outcome: Progressing   Problem: Pain Managment: Goal: General experience of comfort will improve and/or be controlled Outcome: Progressing   Problem: Education: Goal: Knowledge of disease or condition will improve Outcome: Progressing   Problem: Ischemic Stroke/TIA Tissue Perfusion: Goal: Complications of ischemic stroke/TIA will be minimized Outcome: Progressing   Problem: Coping: Goal: Will verbalize positive feelings about self Outcome: Progressing Goal: Will identify appropriate support needs Outcome: Progressing   Problem: Self-Care: Goal: Ability to communicate needs accurately will improve Outcome: Progressing

## 2023-09-11 NOTE — Progress Notes (Signed)
 Physical Therapy Treatment Patient Details Name: Terry Hays MRN: [id number] DOB: [birth year] Today's Date: 09/11/2023   History of Present Illness Pt is a 73 yo male presenting [facility] for confusion and L visual field deficits. MRI demonstrated R occipital lobe infarct. PMH of HLD, HTN, prediabetes, LVH    PT Comments  Pt received in supine and agreeable to session. Pt demonstrates good carryover of scanning technique with slightly improved stability, however continues to require intermittent min A due to vision impairment and instability with balance challenges. Pt scored 12/24 on DGI, indicating that pt is still a high fall risk. Pt's family expressed concerns over home set up and pt's safety with impaired balance. Pt demonstrates good activity tolerance and high motivation to participate in therapy. Patient will benefit from intensive inpatient follow-up therapy, >3 hours/day to maximize progress towards functional independence prior to return home. Acutely, pt continues to benefit from PT services to progress toward functional mobility goals.    If plan is discharge home, recommend the following: A lot of help with walking and/or transfers;A little help with bathing/dressing/bathroom;Assistance with cooking/housework;Assist for transportation;Help with stairs or ramp for entrance   Can travel by private vehicle        Equipment Recommendations  Other (comment) (TBA)    Recommendations for Other Services       Precautions / Restrictions Precautions Precautions: Fall Recall of Precautions/Restrictions: Intact Precaution/Restrictions Comments: L homonymous hemianopsia Restrictions Weight Bearing Restrictions Per Provider Order: No     Mobility  Bed Mobility Overal bed mobility: Needs Assistance Bed Mobility: Supine to Sit, Sit to Supine     Supine to sit: Supervision Sit to supine: Supervision   General bed mobility comments: supervision for safety     Transfers Overall transfer level: Needs assistance Equipment used: None Transfers: Sit to/from Stand Sit to Stand: Contact guard assist           General transfer comment: CGA for safety    Ambulation/Gait Ambulation/Gait assistance: Contact guard assist, Min assist Gait Distance (Feet): 100 Feet (+200) Assistive device: None Gait Pattern/deviations: Narrow base of support, Decreased stride length, Step-through pattern, Drifts right/left Gait velocity: decr     General Gait Details: guarded gait with intermittent light min A for stability with balance challenges. Drifting noted with head turns and dual tasking,  ie looking for room number   Stairs Stairs: Yes Stairs assistance: Contact guard assist Stair Management: One rail Right, Step to pattern, Alternating pattern Number of Stairs: 12 General stair comments: CGA for safety. Good strength, but some instability due to vision impairment   Wheelchair Mobility     Tilt Bed    Modified Rankin (Stroke Patients Only) Modified Rankin (Stroke Patients Only) Pre-Morbid Rankin Score: No symptoms Modified Rankin: Moderately severe disability     Balance Overall balance assessment: Needs assistance Sitting-balance support: No upper extremity supported, Feet supported Sitting balance-Leahy Scale: Good     Standing balance support: No upper extremity supported, Single extremity supported, During functional activity Standing balance-Leahy Scale: Fair Standing balance comment: no AD                 Standardized Balance Assessment Standardized Balance Assessment : Dynamic Gait Index   Dynamic Gait Index Level Surface: Mild Impairment Change in Gait Speed: Mild Impairment Gait with Horizontal Head Turns: Moderate Impairment Gait with Vertical Head Turns: Severe Impairment Gait and Pivot Turn: Mild Impairment Step Over Obstacle: Mild Impairment Step Around Obstacles: Moderate Impairment Steps: Mild  Impairment Total Score: 12      Communication Communication Communication: No apparent difficulties  Cognition Arousal: Alert Behavior During Therapy: WFL for tasks assessed/performed   PT - Cognitive impairments: Attention, Sequencing, Problem solving, Memory                         Following commands: Intact      Cueing Cueing Techniques: Verbal cues  Exercises Other Exercises Other Exercises: x5 serial STS    General Comments        Pertinent Vitals/Pain Pain Assessment Pain Assessment: No/denies pain     PT Goals (current goals can now be found in the care plan section) Acute Rehab PT Goals Patient Stated Goal: to get back to playing pickleball PT Goal Formulation: With patient/family Time For Goal Achievement: 09/23/23 Progress towards PT goals: Progressing toward goals    Frequency    Min 3X/week       AM-PAC PT 6 Clicks Mobility   Outcome Measure  Help needed turning from your back to your side while in a flat bed without using bedrails?: None Help needed moving from lying on your back to sitting on the side of a flat bed without using bedrails?: A Little Help needed moving to and from a bed to a chair (including a wheelchair)?: A Little Help needed standing up from a chair using your arms (e.g., wheelchair or bedside chair)?: A Little Help needed to walk in hospital room?: A Little Help needed climbing 3-5 steps with a railing? : A Little 6 Click Score: 19    End of Session Equipment Utilized During Treatment: Gait belt Activity Tolerance: Patient tolerated treatment well Patient left: in bed;with call bell/phone within reach;with family/visitor present;with bed alarm set Nurse Communication: Mobility status PT Visit Diagnosis: Unsteadiness on feet (R26.81);Other abnormalities of gait and mobility (R26.89);Muscle weakness (generalized) (M62.81)     Time: 8767-8744 PT Time Calculation (min) (ACUTE ONLY): 23 min  Charges:     $Gait Training: 23-37 mins PT General Charges $$ ACUTE PT VISIT: 1 Visit                     Darryle George, PTA Acute Rehabilitation Services Secure Chat Preferred  [phone]    Darryle George 09/11/2023, 1:08 PM

## 2023-09-11 NOTE — Progress Notes (Signed)
 Spoke with patients family regarding his home medication vyvanse and the concern for withdrawl. Daughter spoke with PCP and was told to not restart the medication here. Dr. Patsy was alerted that it was okay to not restart the vyvanse.

## 2023-09-11 NOTE — Progress Notes (Signed)
 Inpatient Rehab Admissions Coordinator:    I met with Pt.'s children to discuss potential CIR admit. They would like to pursue admission and Pt. Will have 24/7 support at discharge. I will send case to insurance and pursue admit.   Leita Kleine, MS, CCC-SLP Rehab Admissions Coordinator  [phone] (celll) [phone] (office)
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
## 2023-09-11 NOTE — Plan of Care (Signed)
  Problem: Education: Goal: Knowledge of General Education information will improve Description: Including pain rating scale, medication(s)/side effects and non-pharmacologic comfort measures 09/11/2023 0511 by Dorothy Garden, RN Outcome: Progressing 09/11/2023 0510 by Dorothy Garden, RN Outcome: Progressing   Problem: Clinical Measurements: Goal: Ability to maintain clinical measurements within normal limits will improve 09/11/2023 0511 by Dorothy Garden, RN Outcome: Progressing 09/11/2023 0510 by Dorothy Garden, RN Outcome: Progressing Goal: Will remain free from infection 09/11/2023 0511 by Dorothy Garden, RN Outcome: Progressing 09/11/2023 0510 by Dorothy Garden, RN Outcome: Progressing Goal: Respiratory complications will improve 09/11/2023 0511 by Dorothy Garden, RN Outcome: Progressing 09/11/2023 0510 by Dorothy Garden, RN Outcome: Progressing Goal: Cardiovascular complication will be avoided 09/11/2023 0511 by Dorothy Garden, RN Outcome: Progressing 09/11/2023 0510 by Dorothy Garden, RN Outcome: Progressing   Problem: Activity: Goal: Risk for activity intolerance will decrease Outcome: Progressing   Problem: Coping: Goal: Level of anxiety will decrease Outcome: Progressing   Problem: Elimination: Goal: Will not experience complications related to urinary retention Outcome: Progressing   Problem: Pain Managment: Goal: General experience of comfort will improve and/or be controlled 09/11/2023 0511 by Dorothy Garden, RN Outcome: Progressing 09/11/2023 0510 by Dorothy Garden, RN Outcome: Progressing   Problem: Education: Goal: Knowledge of disease or condition will improve Outcome: Progressing   Problem: Ischemic Stroke/TIA Tissue Perfusion: Goal: Complications of ischemic stroke/TIA will be minimized 09/11/2023 0511 by Dorothy Garden, RN Outcome: Progressing 09/11/2023 0510 by Dorothy Garden, RN Outcome: Progressing   Problem: Coping: Goal: Will verbalize positive  feelings about self Outcome: Progressing Goal: Will identify appropriate support needs Outcome: Progressing   Problem: Self-Care: Goal: Ability to communicate needs accurately will improve Outcome: Progressing

## 2023-09-11 NOTE — Consult Note (Signed)
 Cardiology Consultation   Patient ID: Terry Hays MRN: [id number]; DOB: 1950-11-07  Admit date: 09/08/2023 Date of Consult: 09/11/2023  PCP:  [name], MD   [facility] Providers Cardiologist:  Aleene Passe, MD        History of Present Illness:: Terry Hays is a 73 y.o. male with a hx of hyperlipidemia, hypertension, prediabetes  who is being seen 09/11/2023 for the evaluation of loop recorder implant at the request of Dr. [name].  Patient presented to the ED with some confusion and visual changes.  Imaging showed an acute/subacute right occipital lobe infarct along with a right PCA P2 occlusion.  EP has been consulted for loop recorder implant.  Today, patient reports feeling relatively well.  No new or acute complaints.  He denies any known history of atrial fibrillation.  Past Medical History:  Diagnosis Date   Abnormal ECG    Allergy    Anxiety    FLYING   Diverticulosis    Dyslipidemia    History of prostate cancer    Hyperlipidemia    Hypertension    LVH (left ventricular hypertrophy)    Pre-diabetes     Past Surgical History:  Procedure Laterality Date   COLONOSCOPY  2004 2011   MAGOD   EYE SURGERY  03/10/1999   CATOACTS BOTH   HERNIA REPAIR  03/09/1952   R INGHINAL   INGUINAL HERNIA REPAIR Left 04/07/2018   Procedure: OPERN LEFT INGUINAL HERNIA REPAIR WITH MESH ERAS PATHWAY;  Surgeon: Tanda Locus, MD;  Location: [facility];  Service: General;  Laterality: Left;   PENILE PROSTHESIS IMPLANT  03/09/2009   DAHLSTEDT   PROSTATE SURGERY  03/09/2001   PROSTRATECTOMY     Scheduled Meds:  amLODipine   5 mg Oral Daily   aspirin  EC  81 mg Oral Daily   atorvastatin   40 mg Oral Daily   clopidogrel   75 mg Oral Daily   losartan   50 mg Oral Daily   pantoprazole   40 mg Oral BID   Continuous Infusions:  PRN Meds: acetaminophen  **OR** acetaminophen  (TYLENOL ) oral liquid 160 mg/5 mL **OR** acetaminophen , gadobutrol , senna-docusate  Allergies:     Allergies  Allergen Reactions   Tramadol Other (See Comments)    hallucinations    Social History:   Social History   Socioeconomic History   Marital status: Married    Spouse name: Not on file   Number of children: Not on file   Years of education: Not on file   Highest education level: Not on file  Occupational History   Not on file  Tobacco Use   Smoking status: Former    Current packs/day: 0.00    Types: Cigars, Cigarettes    Quit date: 03/09/1976    Years since quitting: 47.5   Smokeless tobacco: Never   Tobacco comments:    Smokes 1 cigar every 7 weeks.  Vaping Use   Vaping status: Never Used  Substance and Sexual Activity   Alcohol use: Yes    Alcohol/week: 1.0 standard drink of alcohol    Types: 1 Glasses of wine per week    Comment: scotch   Drug use: No   Sexual activity: Not on file  Other Topics Concern   Not on file  Social History Narrative   Not on file   Social Drivers of Health   Financial Resource Strain: Low Risk  (09/02/2022)   Overall Financial Resource Strain (CARDIA)    Difficulty of Paying Living Expenses: Not very  hard  Recent Concern: Programmer, applications - High Risk (09/02/2022)   Overall Financial Resource Strain (CARDIA)    Difficulty of Paying Living Expenses: Very hard  Food Insecurity: No Food Insecurity (09/08/2023)   Hunger Vital Sign    Worried About Running Out of Food in the Last Year: Never true    Ran Out of Food in the Last Year: Never true  Transportation Needs: No Transportation Needs (09/08/2023)   PRAPARE - Administrator, Civil Service (Medical): No    Lack of Transportation (Non-Medical): No  Physical Activity: Sufficiently Active (05/30/2021)   Exercise Vital Sign    Days of Exercise per Week: 7 days    Minutes of Exercise per Session: 90 min  Stress: No Stress Concern Present (09/02/2022)   [employer] of Occupational Health - Occupational Stress Questionnaire    Feeling of Stress : Only a  little  Social Connections: Socially Integrated (09/08/2023)   Social Connection and Isolation Panel    Frequency of Communication with Friends and Family: More than three times a week    Frequency of Social Gatherings with Friends and Family: More than three times a week    Attends Religious Services: More than 4 times per year    Active Member of [employer] or Organizations: Yes    Attends Engineer, structural: More than 4 times per year    Marital Status: Married  Catering manager Violence: Not At Risk (09/08/2023)   Humiliation, Afraid, Rape, and Kick questionnaire    Fear of Current or Ex-Partner: No    Emotionally Abused: No    Physically Abused: No    Sexually Abused: No    Family History:   Family History  Problem Relation Age of Onset   Heart attack Other    Hyperlipidemia Other    Cancer Other      ROS:  Please see the history of present illness.   All other ROS reviewed and negative.     Physical Exam/Data: Vitals:   09/10/23 2000 09/10/23 2318 09/11/23 0419 09/11/23 0812  BP: (!) 136/44 (!) 162/59 (!) 150/51 (!) 141/64  Pulse: 65 74 67 69  Resp: 18 16 16 19   Temp: 98.6 F (37 C) 98.8 F (37.1 C) 98.9 F (37.2 C) 98.5 F (36.9 C)  TempSrc: Oral Oral Oral Oral  SpO2: 97% 96% 99% 97%  Weight:      Height:       No intake or output data in the 24 hours ending 09/11/23 1026    09/08/2023   10:46 AM 09/02/2022   11:01 AM 04/02/2022    9:47 AM  Last 3 Weights  Weight (lbs) 183 lb 181 lb 6.4 oz 191 lb 3.2 oz  Weight (kg) 83.008 kg 82.283 kg 86.728 kg     Body mass index is 27.83 kg/m.   General: Well developed, in no acute distress.  Neck: No JVD.  Cardiac: Normal rate, regular rhythm.  Resp: Normal work of breathing.  Ext: No edema.  Psych: Normal affect.   EKG:  The EKG was personally reviewed and demonstrates: Sinus rhythm Telemetry:  Telemetry was personally reviewed and demonstrates: Sinus rhythm  Relevant CV Studies:  1. Left ventricular  ejection fraction, by estimation, is 60 to 65%. The  left ventricle has normal function. The left ventricle has no regional  wall motion abnormalities. There is moderate concentric left ventricular  hypertrophy. Left ventricular  diastolic parameters are indeterminate.   2. Right ventricular systolic  function is normal. The right ventricular  size is normal. There is normal pulmonary artery systolic pressure.   3. Left atrial size was moderately dilated.   4. The mitral valve is normal in structure. Trivial mitral valve  regurgitation. No evidence of mitral stenosis.   5. The aortic valve is tricuspid. Aortic valve regurgitation is not  visualized. No aortic stenosis is present.   6. The inferior vena cava is normal in size with greater than 50%  respiratory variability, suggesting right atrial pressure of 3 mmHg.   Laboratory Data: High Sensitivity Troponin:  No results for input(s): TROPONINIHS in the last 720 hours.   Chemistry Recent Labs  Lab 09/09/23 0429 09/10/23 0407 09/11/23 0509  NA 136 137 138  K 3.8 3.9 3.7  CL 106 110 109  CO2 22 20* 21*  GLUCOSE 110* 124* 125*  BUN 14 14 14   CREATININE 1.05 1.04 1.05  CALCIUM  8.5* 8.5* 8.8*  MG  --  2.3 2.3  GFRNONAA >60 >60 >60  ANIONGAP 8 7 8     Recent Labs  Lab 09/08/23 1215  PROT 6.9  ALBUMIN 4.2  AST 24  ALT 37  ALKPHOS 88  BILITOT 0.5   Lipids  Recent Labs  Lab 09/09/23 0429  CHOL 144  TRIG 145  HDL 44  LDLCALC 71  CHOLHDL 3.3    Hematology Recent Labs  Lab 09/09/23 0429 09/10/23 0407 09/11/23 0509  WBC 6.6 7.2 7.3  RBC 2.97* 3.07* 2.95*  HGB 9.2* 9.5* 9.3*  HCT 28.6* 29.2* 27.8*  MCV 96.3 95.1 94.2  MCH 31.0 30.9 31.5  MCHC 32.2 32.5 33.5  RDW 15.6* 15.3 15.1  PLT 291 288 270   Thyroid  Recent Labs  Lab 09/08/23 1215  TSH 2.660    BNPNo results for input(s): BNP, PROBNP in the last 168 hours.  DDimer No results for input(s): DDIMER in the last 168 hours.  Radiology/Studies:   ECHOCARDIOGRAM COMPLETE Result Date: 09/09/2023    ECHOCARDIOGRAM REPORT   Patient Name:   Terry Hays [facility] Date of Exam: 09/09/2023 Medical Rec #:  [id number]         Height:       68.0 in Accession #:    [id number]        Weight:       183.0 lb Date of Birth:  August 10, 1950         BSA:          1.968 m Patient Age:    73 years          BP:           127/50 mmHg Patient Gender: M                 HR:           69 bpm. Exam Location:  Inpatient Procedure: 2D Echo, Cardiac Doppler, Color Doppler and Intracardiac            Opacification Agent (Both Spectral and Color Flow Doppler were            utilized during procedure). Indications:    Stroke  History:        Patient has prior history of Echocardiogram examinations, most                 recent 10/03/2007. Risk Factors:Hypertension and Dyslipidemia.  Sonographer:    Therisa Crouch Referring Phys: [id number] VISHAL R PATEL IMPRESSIONS  1. Left ventricular ejection fraction, by estimation, is  60 to 65%. The left ventricle has normal function. The left ventricle has no regional wall motion abnormalities. There is moderate concentric left ventricular hypertrophy. Left ventricular diastolic parameters are indeterminate.  2. Right ventricular systolic function is normal. The right ventricular size is normal. There is normal pulmonary artery systolic pressure.  3. Left atrial size was moderately dilated.  4. The mitral valve is normal in structure. Trivial mitral valve regurgitation. No evidence of mitral stenosis.  5. The aortic valve is tricuspid. Aortic valve regurgitation is not visualized. No aortic stenosis is present.  6. The inferior vena cava is normal in size with greater than 50% respiratory variability, suggesting right atrial pressure of 3 mmHg. Comparison(s): Prior images unable to be directly viewed, comparison made by report only. Conclusion(s)/Recommendation(s): Normal biventricular function without evidence of hemodynamically significant valvular heart disease.  No intracardiac source of embolism detected on this transthoracic study. Consider a transesophageal echocardiogram to exclude cardiac source of embolism if clinically indicated. No left ventricular mural or apical thrombus/thrombi. FINDINGS  Left Ventricle: Left ventricular ejection fraction, by estimation, is 60 to 65%. The left ventricle has normal function. The left ventricle has no regional wall motion abnormalities. Definity  contrast agent was given IV to delineate the left ventricular  endocardial borders. The left ventricular internal cavity size was normal in size. There is moderate concentric left ventricular hypertrophy. Left ventricular diastolic parameters are indeterminate. Right Ventricle: The right ventricular size is normal. No increase in right ventricular wall thickness. Right ventricular systolic function is normal. There is normal pulmonary artery systolic pressure. The tricuspid regurgitant velocity is 2.47 m/s, and  with an assumed right atrial pressure of 3 mmHg, the estimated right ventricular systolic pressure is 27.4 mmHg. Left Atrium: Left atrial size was moderately dilated. Right Atrium: Right atrial size was normal in size. Pericardium: There is no evidence of pericardial effusion. Mitral Valve: The mitral valve is normal in structure. Trivial mitral valve regurgitation. No evidence of mitral valve stenosis. Tricuspid Valve: The tricuspid valve is normal in structure. Tricuspid valve regurgitation is trivial. No evidence of tricuspid stenosis. Aortic Valve: The aortic valve is tricuspid. Aortic valve regurgitation is not visualized. No aortic stenosis is present. Pulmonic Valve: The pulmonic valve was not well visualized. Pulmonic valve regurgitation is trivial. No evidence of pulmonic stenosis. Aorta: The aortic root, ascending aorta and aortic arch are all structurally normal, with no evidence of dilitation or obstruction. Venous: The inferior vena cava is normal in size with greater  than 50% respiratory variability, suggesting right atrial pressure of 3 mmHg. IAS/Shunts: The atrial septum is grossly normal.  LEFT VENTRICLE PLAX 2D LVIDd:         4.40 cm      Diastology LVIDs:         2.80 cm      LV e' medial:    7.29 cm/s LV PW:         1.61 cm      LV E/e' medial:  6.4 LV IVS:        1.50 cm      LV e' lateral:   9.03 cm/s LVOT diam:     2.20 cm      LV E/e' lateral: 5.2 LVOT Area:     3.80 cm  LV Volumes (MOD) LV vol d, MOD A2C: 86.6 ml LV vol d, MOD A4C: 122.0 ml LV vol s, MOD A2C: 34.0 ml LV vol s, MOD A4C: 47.1 ml LV SV MOD A2C:  52.6 ml LV SV MOD A4C:     122.0 ml LV SV MOD BP:      68.4 ml RIGHT VENTRICLE             IVC RV Basal diam:  2.50 cm     IVC diam: 1.70 cm RV S prime:     16.80 cm/s TAPSE (M-mode): 1.9 cm LEFT ATRIUM             Index LA diam:        4.60 cm 2.34 cm/m LA Vol (A2C):   74.2 ml 37.70 ml/m LA Vol (A4C):   89.0 ml 45.22 ml/m LA Biplane Vol: 82.8 ml 42.07 ml/m   AORTA Ao Root diam: 3.70 cm Ao Asc diam:  3.00 cm MITRAL VALVE               TRICUSPID VALVE MV Area (PHT): 4.54 cm    TR Peak grad:   24.4 mmHg MV Decel Time: 167 msec    TR Vmax:        247.00 cm/s MV E velocity: 46.70 cm/s MV A velocity: 49.30 cm/s  SHUNTS MV E/A ratio:  0.95        Systemic Diam: 2.20 cm Shelda Bruckner MD Electronically signed by Shelda Bruckner MD Signature Date/Time: 09/09/2023/2:02:15 PM    Final    MR Brain W and Wo Contrast Result Date: 09/08/2023 EXAM DESCRIPTION: MR BRAIN W WO CONTRAST CLINICAL HISTORY: Neuro deficit, acute, stroke suspected COMPARISON: None available TECHNIQUE: Non contrast MRI of the brain is performed according to our usual protocol including multiplanar multi sequence technique. FINDINGS: There is a moderate to large acute/subacute infarct in the right PCA territory involving the medial right occipital lobe and portions of the posterior right temporal lobe. No other acute intracranial hemorrhage, mass, edema, or hydrocephalus. No areas of  abnormal enhancement. Mild cortical atrophy. Moderate white matter disease suggesting chronic small vessel ischemic change. Small chronic infarct in the left cerebellum. The vascular flow voids are unremarkable. No significant sinus disease. IMPRESSION: Moderate to large acute/subacute right PCA territory infarct largely involving the medial right occipital lobe. Small chronic infarcts left cerebellum. Age-related change. Electronically signed by: Reyes Frees MD 09/08/2023 09:28 PM EDT RP Workstation: [id number]   CT ANGIO HEAD NECK W WO CM Result Date: 09/08/2023 CLINICAL DATA:  Neuro deficit, concern for stroke, left eye lateral vision cut. Headaches, carotid bruit. EXAM: CT ANGIOGRAPHY HEAD AND NECK WITH AND WITHOUT CONTRAST TECHNIQUE: Multidetector CT imaging of the head and neck was performed using the standard protocol during bolus administration of intravenous contrast. Multiplanar CT image reconstructions and MIPs were obtained to evaluate the vascular anatomy. Carotid stenosis measurements (when applicable) are obtained utilizing NASCET criteria, using the distal internal carotid diameter as the denominator. RADIATION DOSE REDUCTION: This exam was performed according to the departmental dose-optimization program which includes automated exposure control, adjustment of the mA and/or kV according to patient size and/or use of iterative reconstruction technique. CONTRAST:  85mL OMNIPAQUE  IOHEXOL  350 MG/ML SOLN COMPARISON:  None Available. FINDINGS: CT HEAD FINDINGS Brain: No acute intracranial hemorrhage. Hypoattenuation throughout the right PCA territory with loss of gray-white differentiation suggestive of subacute infarct. Additional subtle areas of hyperattenuation within the posterior aspect of the infarct which may reflect areas of petechial hemorrhage versus small focus of relatively spared cortex. Mild local edema. No midline shift. Nonspecific hypoattenuation in the periventricular and  subcortical white matter favored to reflect chronic microvascular ischemic changes. Remote lacunar infarct in  the left thalamus. Ventricles: There is mass effect and mild effacement of the atrium of the right lateral ventricle. Vascular: No hyperdense vessel. Intracranial atherosclerotic calcifications noted. Skull: No acute or aggressive finding. Sinuses/orbits: Bilateral lens replacement. Mild mucosal thickening in the ethmoid sinuses. Other: Mastoid air cells are clear. CTA NECK FINDINGS Aortic arch: Standard configuration of the aortic arch. Imaged portion shows no evidence of aneurysm or dissection. No significant stenosis of the major arch vessel origins. Pulmonary arteries: As permitted by contrast timing, there are no filling defects in the visualized pulmonary arteries. Subclavian arteries: Patent bilaterally. Mild atherosclerosis along the proximal aspect of both subclavian arteries. Right carotid system: Patent from the origin to the skull base. Slightly limited evaluation of the proximal common carotid artery due to streak artifact. Calcified atherosclerosis at the carotid bifurcation without hemodynamically significant stenosis. Tortuosity of the distal cervical ICA. Left carotid system: Patent from the origin to the skull base. Mild atherosclerosis of the common carotid artery. Additional calcified atherosclerosis at the carotid bifurcation without hemodynamically significant stenosis. Vertebral arteries: Left vertebral artery is dominant. Streak artifact limits evaluation of the right vertebral artery origin. Visualized portions of the vertebral arteries are patent to the vertebrobasilar confluence. No evidence of dissection, stenosis (50% or greater), or occlusion. Atherosclerosis of the left V4 segment resulting in mild stenosis. Skeleton: No acute findings. Degenerative changes in the cervical spine. Other neck: The visualized airway is patent. No cervical lymphadenopathy. Upper chest: Visualized  lung apices are clear. Review of the MIP images confirms the above findings CTA HEAD FINDINGS ANTERIOR CIRCULATION: The intracranial ICAs are patent bilaterally. Atherosclerosis of the carotid siphons resulting in mild stenosis of both cavernous ICAs. No high-grade stenosis, proximal occlusion, aneurysm, or vascular malformation. MCAs: The middle cerebral arteries are patent bilaterally. ACAs: The anterior cerebral arteries are patent bilaterally. POSTERIOR CIRCULATION: No significant stenosis, proximal occlusion, aneurysm, or vascular malformation. PCAs: The right P1 and P2a segments are patent. There is abrupt cut off of the right P2 P segment. The left PCA is patent. Pcomm: Not well visualized. SCAs: The superior cerebellar arteries are patent bilaterally. Basilar artery: Patent AICAs: Not well visualized. PICAs: Patent Vertebral arteries: As above. Venous sinuses: As permitted by contrast timing, patent. Anatomic variants: None Review of the MIP images confirms the above findings IMPRESSION: Occlusion of the P2P segment of the right PCA. Associated right PCA territory infarct which is late acute to subacute in appearance. Small focus of relative hyperattenuation within the posterior aspect of the infarcted territory which may reflect a region of petechial hemorrhage versus relatively spared cortex. Consider MRI for further evaluation. Chronic microvascular ischemic changes. Remote lacunar infarct in the left thalamus. Multifocal atherosclerosis as above. No hemodynamically significant stenosis in the neck. Mild stenosis of the carotid siphons and V4 segment left vertebral artery. These results were called by telephone at the time of interpretation on 09/08/2023 at 1:31 pm to provider Dr. Matthews, who verbally acknowledged these results. Electronically Signed   By: Donnice Mania M.D.   On: 09/08/2023 13:53   CT ABDOMEN PELVIS W CONTRAST Result Date: 09/08/2023 CLINICAL DATA:  Abdominal pain, acute, nonlocalized and  dark stools receltnyl EXAM: CT ABDOMEN AND PELVIS WITH CONTRAST TECHNIQUE: Multidetector CT imaging of the abdomen and pelvis was performed using the standard protocol following bolus administration of intravenous contrast. RADIATION DOSE REDUCTION: This exam was performed according to the departmental dose-optimization program which includes automated exposure control, adjustment of the mA and/or kV according to patient size and/or  use of iterative reconstruction technique. CONTRAST:  85mL OMNIPAQUE  IOHEXOL  350 MG/ML SOLN COMPARISON:  None Available. FINDINGS: Lower chest: The lung bases are clear. No pleural effusion. The heart is normal in size. No pericardial effusion. There is a 3 x 5 mm solid noncalcified nodule in the right lung lower lobe. There are several punctate calcified granulomas in the left lower lobe. There are coronary artery atherosclerotic calcifications, in keeping with coronary artery disease. Hepatobiliary: The liver is normal in size. Non-cirrhotic configuration. No suspicious mass. These is mild diffuse hepatic steatosis. No intrahepatic or extrahepatic bile duct dilation. No calcified gallstones. Normal gallbladder wall thickness. No pericholecystic inflammatory changes. Pancreas: Unremarkable. No pancreatic ductal dilatation or surrounding inflammatory changes. Spleen: Within normal limits. No focal lesion. Adrenals/Urinary Tract: Adrenal glands are unremarkable. No suspicious renal mass. There is a 4.7 x 5.1 cm simple cyst arising from the left kidney lower pole. There are additional several subcentimeter hypoattenuating foci in bilateral kidneys, which are too small to adequately characterize. No hydroureteronephrosis or nephroureterolithiasis on either side. Urinary bladder is under distended, precluding optimal assessment. However, no large mass or stones identified. No perivesical fat stranding. Stomach/Bowel: No disproportionate dilation of the small or large bowel loops. No evidence  of abnormal bowel wall thickening or inflammatory changes. The appendix is unremarkable. There are multiple diverticula mainly in the sigmoid colon, without imaging signs of diverticulitis. Vascular/Lymphatic: No ascites or pneumoperitoneum. No abdominal or pelvic lymphadenopathy, by size criteria. No aneurysmal dilation of the major abdominal arteries. There are moderate peripheral atherosclerotic vascular calcifications of the aorta and its major branches. Reproductive: Small/atrophic versus surgically absent prostate gland. Penile implant noted. Other: There is a tiny umbilical hernia containing portion of unobstructed small-bowel. The soft tissues and abdominal wall are otherwise unremarkable. Musculoskeletal: No suspicious osseous lesions. There are moderate multilevel degenerative changes in the visualized spine. IMPRESSION: 1. No acute inflammatory process identified within the abdomen or pelvis. 2. Multiple other nonacute observations, as described above. 3. There is a 3 x 5 mm solid noncalcified nodule in the right lung lower lobe. Please see follow-up recommendations below. Aortic Atherosclerosis (ICD10-I70.0). Pulmonary nodule follow-up recommendation: Solid lung nodule measuring up to 6 mm: - LOW RISK: No routine follow-up. - HIGHER RISK: Optional CT at 12 months. Recommendations for evaluation of incidental nodules derived from guidelines developed by the Fleischner Society ( Radiology 2017; [phone]). These guidelines apply to incidental nodules, which can be managed according to the specific recommendations. These guidelines do not apply to patient's younger than 35 years, immunocompromised patients, or patients with cancer. For lung cancer screening, adherence to the existing [employer] of radiology lung CT Screening Reporting and Data System (lung-RADS) guidelines is recommended. High risk factors include older age, heavy smoking, larger nodule size, irregular or spiculated margins and  upper lobe location. Clinical risk factors include smoking, exposure to other carcinogens, emphysema, fibrosis, upper lobe location, family history of lung cancer, age and sex. Electronically Signed   By: Ree Molt M.D.   On: 09/08/2023 13:32   DG Chest Portable 1 View Result Date: 09/08/2023 CLINICAL DATA:  AMS EXAM: PORTABLE CHEST - 1 VIEW COMPARISON:  February 18, 2007 FINDINGS: No focal airspace consolidation, pleural effusion, or pneumothorax. Mild cardiomegaly.No acute fracture or destructive lesion. Bilateral AC joint osteoarthritis. IMPRESSION: No acute cardiopulmonary abnormality. Electronically Signed   By: Rogelia Myers M.D.   On: 09/08/2023 12:18     Assessment and Plan: #. Cryptogenic stroke: No known history of atrial fibrillation.  Discussed atrial fibrillation as possible etiology for cardioembolic stroke.  Discussed role of extended monitoring for atrial fibrillation in efforts to reduce risk of recurrent strokes.  Discussed role of loop recorder and monitoring for atrial fibrillation.  Patient and wife both voiced understanding.  He is awaiting rehab placement.  There are no plans to discharge over weekend. - Plan for loop recorder implant on Monday.  For questions or updates, please contact [facility] Please consult [email or web address] for contact info under    Signed, Fonda Kitty, MD  09/11/2023 10:26 AM
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
## 2023-09-12 ENCOUNTER — Inpatient Hospital Stay (HOSPITAL_COMMUNITY)

## 2023-09-12 DIAGNOSIS — I63531 Cerebral infarction due to unspecified occlusion or stenosis of right posterior cerebral artery: Secondary | ICD-10-CM | POA: Diagnosis not present

## 2023-09-12 DIAGNOSIS — R4189 Other symptoms and signs involving cognitive functions and awareness: Secondary | ICD-10-CM

## 2023-09-12 DIAGNOSIS — D649 Anemia, unspecified: Secondary | ICD-10-CM | POA: Diagnosis not present

## 2023-09-12 DIAGNOSIS — E872 Acidosis, unspecified: Secondary | ICD-10-CM

## 2023-09-12 LAB — BASIC METABOLIC PANEL WITH GFR
Anion gap: 13 (ref 5–15)
BUN: 13 mg/dL (ref 8–23)
CO2: 16 mmol/L — ABNORMAL LOW (ref 22–32)
Calcium: 8.7 mg/dL — ABNORMAL LOW (ref 8.9–10.3)
Chloride: 108 mmol/L (ref 98–111)
Creatinine, Ser: 1 mg/dL (ref 0.61–1.24)
GFR, Estimated: >60 mL/min (ref >60)
Glucose, Bld: 157 mg/dL — ABNORMAL HIGH (ref 70–99)
Potassium: 4 mmol/L (ref 3.5–5.1)
Sodium: 137 mmol/L (ref 135–145)

## 2023-09-12 LAB — CBC WITH DIFFERENTIAL/PLATELET
Abs Immature Granulocytes: 0.01 K/uL (ref 0.00–0.07)
Basophils Absolute: 0 K/uL (ref 0.0–0.1)
Basophils Relative: 1 %
Eosinophils Absolute: 0.2 K/uL (ref 0.0–0.5)
Eosinophils Relative: 3 %
HCT: 32.4 % — ABNORMAL LOW (ref 39.0–52.0)
Hemoglobin: 9.9 g/dL — ABNORMAL LOW (ref 13.0–17.0)
Immature Granulocytes: 0 %
Lymphocytes Relative: 20 %
Lymphs Abs: 1.3 K/uL (ref 0.7–4.0)
MCH: 30.6 pg (ref 26.0–34.0)
MCHC: 30.6 g/dL (ref 30.0–36.0)
MCV: 100 fL (ref 80.0–100.0)
Monocytes Absolute: 0.5 K/uL (ref 0.1–1.0)
Monocytes Relative: 7 %
Neutro Abs: 4.6 K/uL (ref 1.7–7.7)
Neutrophils Relative %: 69 %
Platelets: 307 K/uL (ref 150–400)
RBC: 3.24 MIL/uL — ABNORMAL LOW (ref 4.22–5.81)
RDW: 15 % (ref 11.5–15.5)
WBC: 6.6 K/uL (ref 4.0–10.5)
nRBC: 0 % (ref 0.0–0.2)

## 2023-09-12 LAB — IRON AND TIBC
Iron: 81 ug/dL (ref 45–182)
Saturation Ratios: 20 % (ref 17.9–39.5)
TIBC: 410 ug/dL (ref 250–450)
UIBC: 329 ug/dL

## 2023-09-12 LAB — GLUCOSE, CAPILLARY
Glucose-Capillary: 124 mg/dL — ABNORMAL HIGH (ref 70–99)
Glucose-Capillary: 134 mg/dL — ABNORMAL HIGH (ref 70–99)

## 2023-09-12 LAB — FERRITIN: Ferritin: 36 ng/mL (ref 24–336)

## 2023-09-12 LAB — VITAMIN B12: Vitamin B-12: 414 pg/mL (ref 180–914)

## 2023-09-12 LAB — MAGNESIUM: Magnesium: 2.2 mg/dL (ref 1.7–2.4)

## 2023-09-12 MED ORDER — POLYETHYLENE GLYCOL 3350 17 G PO PACK
17.0000 g | PACK | Freq: Every day | ORAL | Status: DC
  Administered 2023-09-12 – 2023-09-21 (×10): 17 g via ORAL
  Filled 2023-09-12 (×10): qty 1

## 2023-09-12 MED ORDER — SENNOSIDES-DOCUSATE SODIUM 8.6-50 MG PO TABS
1.0000 | ORAL_TABLET | Freq: Two times a day (BID) | ORAL | Status: DC
  Administered 2023-09-12 – 2023-09-21 (×19): 1 via ORAL
  Filled 2023-09-12 (×19): qty 1

## 2023-09-12 MED ORDER — FERROUS SULFATE 325 (65 FE) MG PO TABS
325.0000 mg | ORAL_TABLET | Freq: Every day | ORAL | Status: DC
  Administered 2023-09-12 – 2023-09-21 (×10): 325 mg via ORAL
  Filled 2023-09-12 (×10): qty 1

## 2023-09-12 NOTE — Progress Notes (Signed)
 PROGRESS NOTE   Subjective/Complaints: Patient complains of visual deficits, right lower extremity weakness He asks about number of hours of rehab per day in CIR  ROS: +visual deficits   Objective:   No results found. Recent Labs    09/10/23 0407 09/11/23 0509  WBC 7.2 7.3  HGB 9.5* 9.3*  HCT 29.2* 27.8*  PLT 288 270   Recent Labs    09/10/23 0407 09/11/23 0509  NA 137 138  K 3.9 3.7  CL 110 109  CO2 20* 21*  GLUCOSE 124* 125*  BUN 14 14  CREATININE 1.04 1.05  CALCIUM  8.5* 8.8*   No intake or output data in the 24 hours ending 09/12/23 1056      Physical Exam: Vital Signs Blood pressure 132/71, pulse 65, temperature 98.1 F (36.7 C), temperature source Oral, resp. rate 18, height 5' 8 (1.727 m), weight 83 kg, SpO2 99%. Gen: no distress, normal appearing HEENT: oral mucosa pink and moist, NCAT Cardio: Reg rate Chest: normal effort, normal rate of breathing Abd: soft, non-distended Ext: no edema Psych: pleasant, normal affect Skin: intact Neuro: Alert and oriented x3 Musculoskeletal: L homonymous hemianopsia, RLE 4/5, strength is otherwise 5/5, impaired FTN on right side   Assessment/Plan: 1. Functional deficits which require 3+ hours per day of interdisciplinary therapy in a comprehensive inpatient rehab setting. Physiatrist is providing close team supervision and 24 hour management of active medical problems listed below. Physiatrist and rehab team continue to assess barriers to discharge/monitor patient progress toward functional and medical goals  Care Tool:  Bathing              Bathing assist       Upper Body Dressing/Undressing Upper body dressing        Upper body assist      Lower Body Dressing/Undressing Lower body dressing            Lower body assist       Toileting Toileting    Toileting assist       Transfers Chair/bed transfer  Transfers assist            Locomotion Ambulation   Ambulation assist              Walk 10 feet activity   Assist           Walk 50 feet activity   Assist           Walk 150 feet activity   Assist           Walk 10 feet on uneven surface  activity   Assist           Wheelchair     Assist               Wheelchair 50 feet with 2 turns activity    Assist            Wheelchair 150 feet activity     Assist          Blood pressure 132/71, pulse 65, temperature 98.1 F (36.7 C), temperature source Oral, resp. rate 18, height 5' 8 (1.727 m), weight 83 kg, SpO2 99%.  1)  CVA: -discussed that ciprofloxacin  use prior to stroke was a possible risk factor for his stroke. Discussed that vyvanse and increased age are also risk factors for stroke  -dicussed plan for loop recorder placement to assess for atrial fibrillation as a potential risk factor  -continue aspirin /statin/plavix   2) Impaired cognition: discussed that he would benefit from intensive SLP  3) Visual deficits: -discussed the benefits he is getting from OT visual rehab, discussed that he can continue to receive this in CIR  -answered questions regarding return to driving, discussed that we will reassess his ability to return to drive as he continues to progress with rehab  4) Right lower extremity weakness: -discussed that he would benefit from intensive PT in CIR     LOS: 3 days A FACE TO FACE EVALUATION WAS PERFORMED  Sven P Kyzer Blowe 09/12/2023, 10:56 AM
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
## 2023-09-12 NOTE — Plan of Care (Signed)
   Problem: Health Behavior/Discharge Planning: Goal: Ability to manage health-related needs will improve Outcome: Progressing

## 2023-09-12 NOTE — Plan of Care (Signed)
 Pt is progressing through treatment well.  Problem: Education: Goal: Knowledge of General Education information will improve Description: Including pain rating scale, medication(s)/side effects and non-pharmacologic comfort measures Outcome: Progressing   Problem: Health Behavior/Discharge Planning: Goal: Ability to manage health-related needs will improve Outcome: Progressing   Problem: Coping: Goal: Level of anxiety will decrease Outcome: Progressing   Problem: Activity: Goal: Risk for activity intolerance will decrease Outcome: Progressing

## 2023-09-12 NOTE — Assessment & Plan Note (Signed)
-   unclear how acute vs chronic for now; wife seems to describe some impairment at times however it may have been related to his ADHD rather than true impairment; nevertheless family reported decline in attention and memory prior to admission during SLP eval on 09/09/2023 also -SLUMS examination performed on 09/09/2023 by SLP with score 21/30.  He had difficulty with delayed recall, attention, mental math (problem solving -- and patient holds PhD in statistics), auditory attention/recall, and clock drawing (executive functions). - family spoke with Dr. Sharlet Fuel who recommended to discontinue Vyvanse (has been  held since admission) - will need repeat testing in 1-2 months for ongoing evaluation/monitoring; suspect this can be done with neurology and/or SLP outpatient

## 2023-09-12 NOTE — Plan of Care (Signed)
   Problem: Education: Goal: Knowledge of General Education information will improve Description Including pain rating scale, medication(s)/side effects and non-pharmacologic comfort measures Outcome: Progressing   Problem: Health Behavior/Discharge Planning: Goal: Ability to manage health-related needs will improve Outcome: Progressing

## 2023-09-12 NOTE — Assessment & Plan Note (Signed)
-   borderline ULN hyperchloremia, suspect consistent with chronic GI blood loss. No other typical NAGMA causes noted - continue PPI for now - if further drop, can consider oral bicarb replacement

## 2023-09-12 NOTE — Plan of Care (Deleted)
 Pt doing well and progressing.  Problem: Education: Goal: Knowledge of General Education information will improve Description: Including pain rating scale, medication(s)/side effects and non-pharmacologic comfort measures Outcome: Progressing   Problem: Health Behavior/Discharge Planning: Goal: Ability to manage health-related needs will improve Outcome: Progressing   Problem: Clinical Measurements: Goal: Ability to maintain clinical measurements within normal limits will improve Outcome: Progressing   Problem: Clinical Measurements: Goal: Will remain free from infection Outcome: Progressing

## 2023-09-13 DIAGNOSIS — E872 Acidosis, unspecified: Secondary | ICD-10-CM

## 2023-09-13 DIAGNOSIS — D649 Anemia, unspecified: Secondary | ICD-10-CM | POA: Diagnosis not present

## 2023-09-13 DIAGNOSIS — I63531 Cerebral infarction due to unspecified occlusion or stenosis of right posterior cerebral artery: Secondary | ICD-10-CM | POA: Diagnosis not present

## 2023-09-13 DIAGNOSIS — R911 Solitary pulmonary nodule: Secondary | ICD-10-CM | POA: Diagnosis not present

## 2023-09-13 LAB — GLUCOSE, CAPILLARY
Glucose-Capillary: 128 mg/dL — ABNORMAL HIGH (ref 70–99)
Glucose-Capillary: 106 mg/dL — ABNORMAL HIGH (ref 70–99)
Glucose-Capillary: 105 mg/dL — ABNORMAL HIGH (ref 70–99)
Glucose-Capillary: 125 mg/dL — ABNORMAL HIGH (ref 70–99)

## 2023-09-13 LAB — CBC WITH DIFFERENTIAL/PLATELET
Abs Immature Granulocytes: 0.01 K/uL (ref 0.00–0.07)
Basophils Absolute: 0.1 K/uL (ref 0.0–0.1)
Basophils Relative: 1 %
Eosinophils Absolute: 0.3 K/uL (ref 0.0–0.5)
Eosinophils Relative: 4 %
HCT: 30.2 % — ABNORMAL LOW (ref 39.0–52.0)
Hemoglobin: 9.7 g/dL — ABNORMAL LOW (ref 13.0–17.0)
Immature Granulocytes: 0 %
Lymphocytes Relative: 22 %
Lymphs Abs: 1.4 K/uL (ref 0.7–4.0)
MCH: 30.6 pg (ref 26.0–34.0)
MCHC: 32.1 g/dL (ref 30.0–36.0)
MCV: 95.3 fL (ref 80.0–100.0)
Monocytes Absolute: 0.6 K/uL (ref 0.1–1.0)
Monocytes Relative: 9 %
Neutro Abs: 4 K/uL (ref 1.7–7.7)
Neutrophils Relative %: 64 %
Platelets: 306 K/uL (ref 150–400)
RBC: 3.17 MIL/uL — ABNORMAL LOW (ref 4.22–5.81)
RDW: 14.6 % (ref 11.5–15.5)
WBC: 6.4 K/uL (ref 4.0–10.5)
nRBC: 0 % (ref 0.0–0.2)

## 2023-09-13 LAB — BASIC METABOLIC PANEL WITH GFR
Anion gap: 11 (ref 5–15)
BUN: 12 mg/dL (ref 8–23)
CO2: 20 mmol/L — ABNORMAL LOW (ref 22–32)
Calcium: 9.1 mg/dL (ref 8.9–10.3)
Chloride: 109 mmol/L (ref 98–111)
Creatinine, Ser: 0.97 mg/dL (ref 0.61–1.24)
GFR, Estimated: >60 mL/min (ref >60)
Glucose, Bld: 114 mg/dL — ABNORMAL HIGH (ref 70–99)
Potassium: 4.1 mmol/L (ref 3.5–5.1)
Sodium: 140 mmol/L (ref 135–145)

## 2023-09-13 LAB — MAGNESIUM: Magnesium: 2.3 mg/dL (ref 1.7–2.4)

## 2023-09-13 NOTE — Progress Notes (Signed)
 Physical Therapy Treatment Patient Details Name: Terry Hays MRN: [id number] DOB: 01-Jan-1951 Today's Date: 09/13/2023   History of Present Illness Pt is a 73 yo male presenting to [facility] on 09/08/23 for confusion and L visual field deficits. MRI demonstrated R occipital lobe infarct. PMH of HLD, HTN, prediabetes, LVH    PT Comments  Pt progressing well towards his physical therapy goals and demonstrates great motivation. Pt continues with left homonymous hemianopsia impacting visual scanning, balance and safety awareness. PT session focused on high level dynamic balance activities to address impaired visual field, promote safe mobility and right sided stability and postural control. Pt demonstrates good activity tolerance throughout session. Pt continues to present at an increased fall risk due to decreased gait speed. Patient will benefit from intensive inpatient follow-up therapy, >3 hours/day.    If plan is discharge home, recommend the following: A little help with bathing/dressing/bathroom;Assistance with cooking/housework;Assist for transportation;Help with stairs or ramp for entrance;A little help with walking and/or transfers   Can travel by private vehicle        Equipment Recommendations  None recommended by PT    Recommendations for Other Services       Precautions / Restrictions Precautions Precautions: Fall Recall of Precautions/Restrictions: Intact Precaution/Restrictions Comments: L homonymous hemianopsia Restrictions Weight Bearing Restrictions Per Provider Order: No     Mobility  Bed Mobility               General bed mobility comments: Sitting EOB upon arrival    Transfers Overall transfer level: Needs assistance Equipment used: None Transfers: Sit to/from Stand Sit to Stand: Contact guard assist                Ambulation/Gait Ambulation/Gait assistance: Contact guard assist Gait Distance (Feet): 100 Feet (50, 100, 100) Assistive device:  None Gait Pattern/deviations: Narrow base of support, Decreased stride length, Step-through pattern       General Gait Details: Verbal cues for reciprocal arm swing   Stairs             Wheelchair Mobility     Tilt Bed    Modified Rankin (Stroke Patients Only) Modified Rankin (Stroke Patients Only) Pre-Morbid Rankin Score: No symptoms Modified Rankin: Moderately severe disability     Balance Overall balance assessment: Needs assistance Sitting-balance support: No upper extremity supported, Feet supported Sitting balance-Leahy Scale: Good     Standing balance support: No upper extremity supported, Single extremity supported, During functional activity Standing balance-Leahy Scale: Good                              Communication Communication Communication: No apparent difficulties  Cognition Arousal: Alert Behavior During Therapy: WFL for tasks assessed/performed   PT - Cognitive impairments: Memory, Problem solving                         Following commands: Intact      Cueing Cueing Techniques: Verbal cues  Exercises Other Exercises Other Exercises: Balance activities: step and pivot back both feet x 10, tandem walking x 20 ft, backwards walking x 20 ft, head turns, varying gait speeds Other Exercises: 4 square step test Other Exercises: Toe touches with L foot (x 1 minute, 2 sets), lateral step ups x 10 ft (bilaterally)    General Comments        Pertinent Vitals/Pain Pain Assessment Pain Assessment: No/denies pain    Home Living  Prior Function            PT Goals (current goals can now be found in the care plan section) Acute Rehab PT Goals Patient Stated Goal: to get back to playing pickleball Potential to Achieve Goals: Good Progress towards PT goals: Progressing toward goals    Frequency    Min 3X/week      PT Plan      Co-evaluation              AM-PAC PT 6  Clicks Mobility   Outcome Measure  Help needed turning from your back to your side while in a flat bed without using bedrails?: None Help needed moving from lying on your back to sitting on the side of a flat bed without using bedrails?: None Help needed moving to and from a bed to a chair (including a wheelchair)?: None Help needed standing up from a chair using your arms (e.g., wheelchair or bedside chair)?: None Help needed to walk in hospital room?: A Little Help needed climbing 3-5 steps with a railing? : A Little 6 Click Score: 22    End of Session Equipment Utilized During Treatment: Gait belt Activity Tolerance: Patient tolerated treatment well Patient left: in bed;with call bell/phone within reach;with family/visitor present Nurse Communication: Mobility status PT Visit Diagnosis: Unsteadiness on feet (R26.81);Other abnormalities of gait and mobility (R26.89);Muscle weakness (generalized) (M62.81)     Time: 8463-8392 PT Time Calculation (min) (ACUTE ONLY): 31 min  Charges:    $Therapeutic Activity: 23-37 mins PT General Charges $$ ACUTE PT VISIT: 1 Visit                     Aleck Daring, PT, DPT Acute Rehabilitation Services Office [phone]    Alayne ONEIDA Daring 09/13/2023, 4:37 PM

## 2023-09-13 NOTE — Progress Notes (Signed)
 Inpatient Rehab Admissions Coordinator:    CIR following. Case pending with insurance, I will follow for potential admit pending insurance auth.   Leita Kleine, MS, CCC-SLP Rehab Admissions Coordinator  [phone] (celll) [phone] (office)
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
## 2023-09-13 NOTE — TOC Progression Note (Signed)
 Transition of Care [facility]) - Progression Note    Patient Details  Name: Terry Hays MRN: [id number] Date of Birth: 19-Dec-1950  [facility] CM/SW Contact  Andrez JULIANNA George, RN Phone Number: 09/13/2023, 3:16 PM  Clinical Narrative:     Awaiting determination on CIR from [facility]. TOC following.  Expected Discharge Plan: IP Rehab Facility Barriers to Discharge: Continued Medical Work up  Expected Discharge Plan and Services   Discharge Planning Services: CM Consult Post Acute Care Choice: IP Rehab Living arrangements for the past 2 months: Single Family Home                                       Social Determinants of Health (SDOH) Interventions SDOH Screenings   Food Insecurity: No Food Insecurity (09/08/2023)  Housing: Low Risk  (09/08/2023)  Transportation Needs: No Transportation Needs (09/08/2023)  Utilities: Not At Risk (09/08/2023)  Depression (PHQ2-9): Low Risk  (09/02/2022)  Financial Resource Strain: Low Risk  (09/02/2022)  Recent Concern: Financial Resource Strain - High Risk (09/02/2022)  Physical Activity: Sufficiently Active (05/30/2021)  Social Connections: Socially Integrated (09/08/2023)  Stress: No Stress Concern Present (09/02/2022)  Tobacco Use: Medium Risk (09/08/2023)    Readmission Risk Interventions     No data to display

## 2023-09-13 NOTE — Care Management Important Message (Signed)
 Important Message  Patient Details  Name: Terry Hays MRN: [id number] Date of Birth: [birth year]   Important Message Given:  Yes - Medicare IM     Claretta Deed 09/13/2023, 3:14 PM

## 2023-09-13 NOTE — Progress Notes (Signed)
 Occupational Therapy Treatment Patient Details Name: Terry Hays MRN: [id number] DOB: 09-26-1950 Today's Date: 09/13/2023   History of present illness Pt is a 73 yo male [facility] for confusion and L visual field deficits. MRI demonstrated R occipital lobe infarct. PMH of HLD, HTN, prediabetes, LVH   OT comments  Pt continuing to progress with visual compensation strategies. Had pt focus on functional tasks today such as cleaning his room and making his bed, forcing pt to locate ADL items using scanning strategies. He continues to be very cautious with OOB mobility, overall he demonstrated ability to safely ambulate around the room without any LOB and avoiding fall hazards. OT to continue to progress pt as able. DC plans remain appropriate for CIR.       If plan is discharge home, recommend the following:  Assist for transportation;Assistance with cooking/housework   Equipment Recommendations  None recommended by OT    Recommendations for Other Services      Precautions / Restrictions Precautions Precautions: Fall Recall of Precautions/Restrictions: Intact Precaution/Restrictions Comments: L homonymous hemianopsia Restrictions Weight Bearing Restrictions Per Provider Order: No       Mobility Bed Mobility               General bed mobility comments: Pt in recliner on arrival    Transfers Overall transfer level: Needs assistance Equipment used: None Transfers: Sit to/from Stand Sit to Stand: Contact guard assist           General transfer comment: CGA for safety     Balance Overall balance assessment: Needs assistance Sitting-balance support: No upper extremity supported, Feet supported Sitting balance-Leahy Scale: Good Sitting balance - Comments: in recliner   Standing balance support: No upper extremity supported, Single extremity supported, During functional activity Standing balance-Leahy Scale: Fair Standing balance comment: no  AD                           ADL either performed or assessed with clinical judgement   ADL Overall ADL's : Needs assistance/impaired                     Lower Body Dressing: Set up;Sitting/lateral leans Lower Body Dressing Details (indicate cue type and reason): don socks Toilet Transfer: Contact guard assist;Ambulation   Toileting- Clothing Manipulation and Hygiene: Contact guard assist;Sit to/from stand       Functional mobility during ADLs: Contact guard assist General ADL Comments: Pt deconstructing bed and cleaning up trash around room. navigating dirty laundry to dirty linen area with CGA. Pt making efforts to cue himself of visual compensation strategies during functional tasks.    Extremity/Trunk Assessment Upper Extremity Assessment Upper Extremity Assessment: Overall WFL for tasks assessed            Vision   Visual Fields: Left homonymous hemianopsia Additional Comments: Pt improving with use of vision strategies to identify objects and safety hazards around room   Perception     Praxis     Communication Communication Communication: No apparent difficulties   Cognition Arousal: Alert Behavior During Therapy: WFL for tasks assessed/performed Cognition: No apparent impairments             OT - Cognition Comments: no formal cog assessment given                 Following commands: Intact        Cueing   Cueing Techniques: Verbal cues  Exercises      Shoulder Instructions       General Comments      Pertinent Vitals/ Pain       Pain Assessment Pain Assessment: No/denies pain  Home Living                                          Prior Functioning/Environment              Frequency  Min 2X/week        Progress Toward Goals  OT Goals(current goals can now be found in the care plan section)  Progress towards OT goals: Progressing toward goals  Acute Rehab OT Goals Patient Stated  Goal: to go home OT Goal Formulation: With patient Time For Goal Achievement: 09/23/23 Potential to Achieve Goals: Good  Plan      Co-evaluation                 AM-PAC OT 6 Clicks Daily Activity     Outcome Measure   Help from another person eating meals?: None Help from another person taking care of personal grooming?: A Little Help from another person toileting, which includes using toliet, bedpan, or urinal?: A Little Help from another person bathing (including washing, rinsing, drying)?: A Little Help from another person to put on and taking off regular upper body clothing?: A Little Help from another person to put on and taking off regular lower body clothing?: A Little 6 Click Score: 19    End of Session    OT Visit Diagnosis: Low vision, both eyes (H54.2);Unsteadiness on feet (R26.81);Other abnormalities of gait and mobility (R26.89)   Activity Tolerance Patient tolerated treatment well   Patient Left in chair;with call bell/phone within reach;with family/visitor present   Nurse Communication Mobility status        Time: 9154-9064 OT Time Calculation (min): 50 min  Charges: OT General Charges $OT Visit: 1 Visit OT Treatments $Therapeutic Activity: 38-52 mins  09/13/2023  AB, OTR/L  Acute Rehabilitation Services  Office: [phone]   Terry Hays 09/13/2023, 9:40 AM

## 2023-09-13 NOTE — Progress Notes (Signed)
  EP following along. Plan for Loop recorder on day of discharge. Insurance auth still pending.   Ozell Jodie Passey, PA-C  09/13/2023 9:48 AM

## 2023-09-14 ENCOUNTER — Encounter (HOSPITAL_COMMUNITY): Payer: Self-pay | Admitting: Internal Medicine

## 2023-09-14 DIAGNOSIS — F901 Attention-deficit hyperactivity disorder, predominantly hyperactive type: Secondary | ICD-10-CM | POA: Diagnosis not present

## 2023-09-14 DIAGNOSIS — R911 Solitary pulmonary nodule: Secondary | ICD-10-CM | POA: Diagnosis not present

## 2023-09-14 DIAGNOSIS — R4189 Other symptoms and signs involving cognitive functions and awareness: Secondary | ICD-10-CM

## 2023-09-14 DIAGNOSIS — I63531 Cerebral infarction due to unspecified occlusion or stenosis of right posterior cerebral artery: Secondary | ICD-10-CM | POA: Diagnosis not present

## 2023-09-14 DIAGNOSIS — D649 Anemia, unspecified: Secondary | ICD-10-CM | POA: Diagnosis not present

## 2023-09-14 LAB — BASIC METABOLIC PANEL WITH GFR
Anion gap: 8 (ref 5–15)
BUN: 17 mg/dL (ref 8–23)
CO2: 22 mmol/L (ref 22–32)
Calcium: 8.7 mg/dL — ABNORMAL LOW (ref 8.9–10.3)
Chloride: 108 mmol/L (ref 98–111)
Creatinine, Ser: 1.07 mg/dL (ref 0.61–1.24)
GFR, Estimated: >60 mL/min (ref >60)
Glucose, Bld: 122 mg/dL — ABNORMAL HIGH (ref 70–99)
Potassium: 4.3 mmol/L (ref 3.5–5.1)
Sodium: 138 mmol/L (ref 135–145)

## 2023-09-14 LAB — CBC WITH DIFFERENTIAL/PLATELET
Abs Immature Granulocytes: 0.02 K/uL (ref 0.00–0.07)
Basophils Absolute: 0 K/uL (ref 0.0–0.1)
Basophils Relative: 1 %
Eosinophils Absolute: 0.3 K/uL (ref 0.0–0.5)
Eosinophils Relative: 4 %
HCT: 30 % — ABNORMAL LOW (ref 39.0–52.0)
Hemoglobin: 9.7 g/dL — ABNORMAL LOW (ref 13.0–17.0)
Immature Granulocytes: 0 %
Lymphocytes Relative: 21 %
Lymphs Abs: 1.3 K/uL (ref 0.7–4.0)
MCH: 30.4 pg (ref 26.0–34.0)
MCHC: 32.3 g/dL (ref 30.0–36.0)
MCV: 94 fL (ref 80.0–100.0)
Monocytes Absolute: 0.7 K/uL (ref 0.1–1.0)
Monocytes Relative: 11 %
Neutro Abs: 4 K/uL (ref 1.7–7.7)
Neutrophils Relative %: 63 %
Platelets: 299 K/uL (ref 150–400)
RBC: 3.19 MIL/uL — ABNORMAL LOW (ref 4.22–5.81)
RDW: 14.6 % (ref 11.5–15.5)
WBC: 6.4 K/uL (ref 4.0–10.5)
nRBC: 0 % (ref 0.0–0.2)

## 2023-09-14 LAB — GLUCOSE, CAPILLARY
Glucose-Capillary: 119 mg/dL — ABNORMAL HIGH (ref 70–99)
Glucose-Capillary: 150 mg/dL — ABNORMAL HIGH (ref 70–99)
Glucose-Capillary: 151 mg/dL — ABNORMAL HIGH (ref 70–99)
Glucose-Capillary: 177 mg/dL — ABNORMAL HIGH (ref 70–99)

## 2023-09-14 LAB — MAGNESIUM: Magnesium: 2.4 mg/dL (ref 1.7–2.4)

## 2023-09-14 NOTE — Progress Notes (Signed)
 Inpatient Rehab Admissions Coordinator:    Insurance denied case for CIR. Family wishes to appeal. I will send case.   Leita Kleine, MS, CCC-SLP Rehab Admissions Coordinator  [phone] (celll) [phone] (office)

## 2023-09-14 NOTE — Plan of Care (Signed)
  Problem: Education: Goal: Knowledge of General Education information will improve Description: Including pain rating scale, medication(s)/side effects and non-pharmacologic comfort measures Outcome: Progressing   Problem: Health Behavior/Discharge Planning: Goal: Ability to manage health-related needs will improve Outcome: Progressing   Problem: Clinical Measurements: Goal: Ability to maintain clinical measurements within normal limits will improve Outcome: Progressing Goal: Diagnostic test results will improve Outcome: Progressing   Problem: Activity: Goal: Risk for activity intolerance will decrease Outcome: Progressing   Problem: Nutrition: Goal: Adequate nutrition will be maintained Outcome: Progressing   Problem: Safety: Goal: Ability to remain free from injury will improve Outcome: Progressing

## 2023-09-14 NOTE — Consult Note (Signed)
 Reason for Consult: Anemia Referring Physician: Hospital team and rehab team  Terry Hays is an 73 y.o. male.  HPI: Patient seen and examined and case discussed with his brother from [place]  and his hospital computer chart and our office computer chart was reviewed and he had a colonoscopy by me in 2011 with some small polyps and he may have had one afterwards by Dr. Luis although that is not in Dr. Joyce his primary care doctor's notes and he did have a negative Cologuard in 21 however about 3 weeks ago had a few days of black bowel movements and was [place] C[place] the [place] bowel movements went away and he had been on an aspirin  a day and had been on some Aleve for both arthritis pain and musculoskeletal pain and for some abdominal pain which has since resolved and currently he has no GI complaints and his brother had colon polyps as well and we answered all of their questions Past Medical History:  Diagnosis Date   Abnormal ECG    Allergy    Anxiety    FLYING   Diverticulosis    Dyslipidemia    History of prostate cancer    Hyperlipidemia    Hypertension    LVH (left ventricular hypertrophy)    Pre-diabetes     Past Surgical History:  Procedure Laterality Date   COLONOSCOPY  2004 2011   Bonnye Halle   EYE SURGERY  03/10/1999   CATOACTS BOTH   HERNIA REPAIR  03/09/1952   R INGHINAL   INGUINAL HERNIA REPAIR Left 04/07/2018   Procedure: OPERN LEFT INGUINAL HERNIA REPAIR WITH MESH ERAS PATHWAY;  Surgeon: Tanda Locus, MD;  Location: [facility];  Service: General;  Laterality: Left;   PENILE PROSTHESIS IMPLANT  03/09/2009   DAHLSTEDT   PROSTATE SURGERY  03/09/2001   PROSTRATECTOMY    Family History  Problem Relation Age of Onset   Heart attack Other    Hyperlipidemia Other    Cancer Other     Social History:  reports that he quit smoking about 47 years ago. His smoking use included cigars and cigarettes. He has never used smokeless  tobacco. He reports current alcohol use of about 1.0 standard drink of alcohol per week. He reports that he does not use drugs.  Allergies:  Allergies  Allergen Reactions   Tramadol Other (See Comments)    hallucinations    Medications: I have reviewed the patient's current medications.  Results for orders placed or performed during the hospital encounter of 09/08/23 (from the past 48 hours)  Glucose, capillary     Status: Abnormal   Collection Time: 09/12/23 10:10 PM  Result Value Ref Range   Glucose-Capillary 134 (H) 70 - 99 mg/dL    Comment: Glucose reference range applies only to samples taken after fasting for at least 8 hours.  Basic metabolic panel with GFR     Status: Abnormal   Collection Time: 09/13/23  4:20 AM  Result Value Ref Range   Sodium 140 135 - 145 mmol/L   Potassium 4.1 3.5 - 5.1 mmol/L   Chloride 109 98 - 111 mmol/L   CO2 20 (L) 22 - 32 mmol/L   Glucose, Bld 114 (H) 70 - 99 mg/dL    Comment: Glucose reference range applies only to samples taken after fasting for at least 8 hours.   BUN 12 8 - 23 mg/dL   Creatinine, Ser 9.02 0.61 - 1.24 mg/dL  Calcium  9.1 8.9 - 10.3 mg/dL   GFR, Estimated >39 >39 mL/min    Comment: (NOTE) Calculated using the CKD-EPI Creatinine Equation (2021)    Anion gap 11 5 - 15    Comment: Performed at [facility], [address] [place], [place] [address]  CBC with Differential/Platelet     Status: Abnormal   Collection Time: 09/13/23  4:20 AM  Result Value Ref Range   WBC 6.4 4.0 - 10.5 K/uL   RBC 3.17 (L) 4.22 - 5.81 MIL/uL   Hemoglobin 9.7 (L) 13.0 - 17.0 g/dL   HCT 69.7 (L) 60.9 - 47.9 %   MCV 95.3 80.0 - 100.0 fL   MCH 30.6 26.0 - 34.0 pg   MCHC 32.1 30.0 - 36.0 g/dL   RDW 85.3 88.4 - 84.4 %   Platelets 306 150 - 400 K/uL   nRBC 0.0 0.0 - 0.2 %   Neutrophils Relative % 64 %   Neutro Abs 4.0 1.7 - 7.7 K/uL   Lymphocytes Relative 22 %   Lymphs Abs 1.4 0.7 - 4.0 K/uL   Monocytes Relative 9 %   Monocytes  Absolute 0.6 0.1 - 1.0 K/uL   Eosinophils Relative 4 %   Eosinophils Absolute 0.3 0.0 - 0.5 K/uL   Basophils Relative 1 %   Basophils Absolute 0.1 0.0 - 0.1 K/uL   Immature Granulocytes 0 %   Abs Immature Granulocytes 0.01 0.00 - 0.07 K/uL    Comment: Performed at [facility], [address], [place], [place] [address]  Magnesium     Status: None   Collection Time: 09/13/23  4:20 AM  Result Value Ref Range   Magnesium 2.3 1.7 - 2.4 mg/dL    Comment: Performed at [facility], [address], [place], [place] [address]  Glucose, capillary     Status: Abnormal   Collection Time: 09/13/23  6:17 AM  Result Value Ref Range   Glucose-Capillary 128 (H) 70 - 99 mg/dL    Comment: Glucose reference range applies only to samples taken after fasting for at least 8 hours.   Comment 1 Notify RN    Comment 2 Document in Chart   Glucose, capillary     Status: Abnormal   Collection Time: 09/13/23 12:41 PM  Result Value Ref Range   Glucose-Capillary 106 (H) 70 - 99 mg/dL    Comment: Glucose reference range applies only to samples taken after fasting for at least 8 hours.   Comment 1 Notify RN    Comment 2 Document in Chart   Glucose, capillary     Status: Abnormal   Collection Time: 09/13/23  5:35 PM  Result Value Ref Range   Glucose-Capillary 105 (H) 70 - 99 mg/dL    Comment: Glucose reference range applies only to samples taken after fasting for at least 8 hours.   Comment 1 Notify RN    Comment 2 Document in Chart   Glucose, capillary     Status: Abnormal   Collection Time: 09/13/23  9:14 PM  Result Value Ref Range   Glucose-Capillary 125 (H) 70 - 99 mg/dL    Comment: Glucose reference range applies only to samples taken after fasting for at least 8 hours.   Comment 1 Notify RN   Basic metabolic panel with GFR     Status: Abnormal   Collection Time: 09/14/23  4:48 AM  Result Value Ref Range   Sodium 138 135 - 145 mmol/L   Potassium  4.3 3.5 - 5.1 mmol/L   Chloride 108 98 -  111 mmol/L   CO2 22 22 - 32 mmol/L   Glucose, Bld 122 (H) 70 - 99 mg/dL    Comment: Glucose reference range applies only to samples taken after fasting for at least 8 hours.   BUN 17 8 - 23 mg/dL   Creatinine, Ser 8.92 0.61 - 1.24 mg/dL   Calcium  8.7 (L) 8.9 - 10.3 mg/dL   GFR, Estimated >39 >39 mL/min    Comment: (NOTE) Calculated using the CKD-EPI Creatinine Equation (2021)    Anion gap 8 5 - 15    Comment: Performed at [facility], [address], [place], [place] [address]  CBC with Differential/Platelet     Status: Abnormal   Collection Time: 09/14/23  4:48 AM  Result Value Ref Range   WBC 6.4 4.0 - 10.5 K/uL   RBC 3.19 (L) 4.22 - 5.81 MIL/uL   Hemoglobin 9.7 (L) 13.0 - 17.0 g/dL   HCT 69.9 (L) 60.9 - 47.9 %   MCV 94.0 80.0 - 100.0 fL   MCH 30.4 26.0 - 34.0 pg   MCHC 32.3 30.0 - 36.0 g/dL   RDW 85.3 88.4 - 84.4 %   Platelets 299 150 - 400 K/uL   nRBC 0.0 0.0 - 0.2 %   Neutrophils Relative % 63 %   Neutro Abs 4.0 1.7 - 7.7 K/uL   Lymphocytes Relative 21 %   Lymphs Abs 1.3 0.7 - 4.0 K/uL   Monocytes Relative 11 %   Monocytes Absolute 0.7 0.1 - 1.0 K/uL   Eosinophils Relative 4 %   Eosinophils Absolute 0.3 0.0 - 0.5 K/uL   Basophils Relative 1 %   Basophils Absolute 0.0 0.0 - 0.1 K/uL   Immature Granulocytes 0 %   Abs Immature Granulocytes 0.02 0.00 - 0.07 K/uL    Comment: Performed at [facility], [address], [place], [place] [address]  Magnesium     Status: None   Collection Time: 09/14/23  4:48 AM  Result Value Ref Range   Magnesium 2.4 1.7 - 2.4 mg/dL    Comment: Performed at [facility], [address] [place], [place] [address]  Glucose, capillary     Status: Abnormal   Collection Time: 09/14/23  6:11 AM  Result Value Ref Range   Glucose-Capillary 119 (H) 70 - 99 mg/dL    Comment: Glucose reference range applies only to samples taken after fasting for at least 8 hours.   Comment 1 Notify RN   Glucose, capillary     Status: Abnormal    Collection Time: 09/14/23 11:37 AM  Result Value Ref Range   Glucose-Capillary 150 (H) 70 - 99 mg/dL    Comment: Glucose reference range applies only to samples taken after fasting for at least 8 hours.    CT CHEST WO CONTRAST Result Date: 09/12/2023 CLINICAL DATA:  Right lower lobe nodule identified by prior CT abdomen pelvis, evaluate for additional nodules * Tracking Code: BO * EXAM: CT CHEST WITHOUT CONTRAST TECHNIQUE: Multidetector CT imaging of the chest was performed following the standard protocol without IV contrast. RADIATION DOSE REDUCTION: This exam was performed according to the departmental dose-optimization program which includes automated exposure control, adjustment of the mA and/or kV according to patient size and/or use of iterative reconstruction technique. COMPARISON:  CT abdomen pelvis, 09/08/2023 FINDINGS: Cardiovascular: Aortic atherosclerosis. Normal heart size. Three-vessel coronary artery calcifications. No pericardial effusion. Mediastinum/Nodes: No enlarged  mediastinal, hilar, or axillary lymph nodes. Thyroid gland, trachea, and esophagus demonstrate no significant findings. Lungs/Pleura: 0.4 cm nodule of the dependent right lower lobe (series 2, image 75). Additional nodule just superiorly measures 0.3 cm (series 4, image 72). Bandlike scarring of the right lung base. Tiny calcified clustered nodules in the peripheral left lower lobe (series 4, image 115). No pleural effusion or pneumothorax. Upper Abdomen: No acute abnormality. Musculoskeletal: No chest wall abnormality. No acute osseous findings. Disc degenerative disease and bridging osteophytosis throughout the thoracic and upper lumbar spine, in keeping with DISH. IMPRESSION: 1. Two small nodules of the dependent right lower lobe, measuring 0.4 cm and 0.3 cm. No follow-up needed if patient is low-risk (and has no known or suspected primary neoplasm). Non-contrast chest CT can be considered in 12 months if patient is  high-risk. This recommendation follows the consensus statement: Guidelines for Management of Incidental Pulmonary Nodules Detected on CT Images: From the Fleischner Society 2017; Radiology 2017; 284:228-243. 2. Coronary artery disease. Aortic Atherosclerosis (ICD10-I70.0). Electronically Signed   By: Marolyn JONETTA Jaksch M.D.   On: 09/12/2023 15:41    ROS negative except above Blood pressure 129/64, pulse 63, temperature 98.1 F (36.7 C), temperature source Oral, resp. rate 18, height 5' 8 (1.727 m), weight 83 kg, SpO2 96%. Physical Exam vital signs stable afebrile no acute distress abdomen is soft nontender BUN and creatinine and liver tests normal he was not iron deficient hemoglobin has been stable with this hospital stay but dropped from a baseline of 15 and 1to 9.3 -9.7 and currently he is on aspirin  Plavix  and iron and reassurance was given about his essentially normal CT of the abdomen and pelvis  Assessment/Plan: Recent CVA in patient with anemia probably from recent aspirin  and nonsteroidal induced GI bleed Plan: Happy to proceed with a endoscopy with propofol  sedation if okay with rehab or neurology team otherwise happy to follow as an outpatient and then in 3 to 6 months when okay by neurology would stop his Plavix  and proceed with endoscopy and colonoscopy at that time and please call me if I can be of any assistance as above otherwise would use twice daily pump inhibitor for 1 month and then okay to drop it to once a day follow-up with me as an outpatient in 1 to 2 months to check on above and see if hemoglobin is coming up on iron Alandria Butkiewicz E 09/14/2023, 2:44 PM
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
## 2023-09-15 DIAGNOSIS — I63531 Cerebral infarction due to unspecified occlusion or stenosis of right posterior cerebral artery: Secondary | ICD-10-CM | POA: Diagnosis not present

## 2023-09-15 LAB — CBC WITH DIFFERENTIAL/PLATELET
Abs Immature Granulocytes: 0.03 K/uL (ref 0.00–0.07)
Basophils Absolute: 0.1 K/uL (ref 0.0–0.1)
Basophils Relative: 1 %
Eosinophils Absolute: 0.3 K/uL (ref 0.0–0.5)
Eosinophils Relative: 4 %
HCT: 33.2 % — ABNORMAL LOW (ref 39.0–52.0)
Hemoglobin: 10.6 g/dL — ABNORMAL LOW (ref 13.0–17.0)
Immature Granulocytes: 1 %
Lymphocytes Relative: 22 %
Lymphs Abs: 1.4 K/uL (ref 0.7–4.0)
MCH: 30.2 pg (ref 26.0–34.0)
MCHC: 31.9 g/dL (ref 30.0–36.0)
MCV: 94.6 fL (ref 80.0–100.0)
Monocytes Absolute: 0.7 K/uL (ref 0.1–1.0)
Monocytes Relative: 10 %
Neutro Abs: 4.2 K/uL (ref 1.7–7.7)
Neutrophils Relative %: 62 %
Platelets: 322 K/uL (ref 150–400)
RBC: 3.51 MIL/uL — ABNORMAL LOW (ref 4.22–5.81)
RDW: 14.7 % (ref 11.5–15.5)
WBC: 6.7 K/uL (ref 4.0–10.5)
nRBC: 0 % (ref 0.0–0.2)

## 2023-09-15 LAB — GLUCOSE, CAPILLARY
Glucose-Capillary: 141 mg/dL — ABNORMAL HIGH (ref 70–99)
Glucose-Capillary: 135 mg/dL — ABNORMAL HIGH (ref 70–99)
Glucose-Capillary: 185 mg/dL — ABNORMAL HIGH (ref 70–99)

## 2023-09-15 LAB — BASIC METABOLIC PANEL WITH GFR
Anion gap: 7 (ref 5–15)
BUN: 14 mg/dL (ref 8–23)
CO2: 23 mmol/L (ref 22–32)
Calcium: 9 mg/dL (ref 8.9–10.3)
Chloride: 109 mmol/L (ref 98–111)
Creatinine, Ser: 1.06 mg/dL (ref 0.61–1.24)
GFR, Estimated: >60 mL/min (ref >60)
Glucose, Bld: 121 mg/dL — ABNORMAL HIGH (ref 70–99)
Potassium: 4.1 mmol/L (ref 3.5–5.1)
Sodium: 139 mmol/L (ref 135–145)

## 2023-09-15 LAB — MAGNESIUM: Magnesium: 2.3 mg/dL (ref 1.7–2.4)

## 2023-09-15 NOTE — Progress Notes (Signed)
 Inpatient Rehab Admissions Coordinator:    Appeal for CIR is pending.   Leita Kleine, MS, CCC-SLP Rehab Admissions Coordinator  [phone] (celll) [phone] (office)

## 2023-09-15 NOTE — Plan of Care (Signed)
  Problem: Activity: Goal: Risk for activity intolerance will decrease Outcome: Progressing   Problem: Pain Managment: Goal: General experience of comfort will improve and/or be controlled Outcome: Progressing   Problem: Skin Integrity: Goal: Risk for impaired skin integrity will decrease Outcome: Progressing   Problem: Safety: Goal: Ability to remain free from injury will improve Outcome: Progressing

## 2023-09-15 NOTE — Progress Notes (Signed)
 Physical Therapy Treatment Patient Details Name: Terry Hays MRN: [id number] DOB: 29-Jun-1950 Today's Date: 09/15/2023   History of Present Illness Pt is a 73 yo male presenting to [facility] on 09/08/23 for confusion and L visual field deficits. MRI demonstrated R occipital lobe infarct. PMH of HLD, HTN, prediabetes, LVH    PT Comments  Pt received in supine and agreeable to session. Pt demonstrates good progress towards functional mobility goals. Pt demonstrates increased instability with balance challenges, but no LOB. Pt scored 18/24 on DGI, which indicates high fall risk. Pt able to complete 2 stair trials this session with improved stability and use of scan and anchor technique, but continues to require CGA for safety. Pt and pt's wife report increased instability and impaired vision after poor sleep. Pt continues to benefit from PT services to progress toward functional mobility goals.    If plan is discharge home, recommend the following: A little help with bathing/dressing/bathroom;Assistance with cooking/housework;Assist for transportation;Help with stairs or ramp for entrance;A little help with walking and/or transfers   Can travel by private vehicle        Equipment Recommendations  None recommended by PT    Recommendations for Other Services       Precautions / Restrictions Precautions Precautions: Fall Recall of Precautions/Restrictions: Intact Precaution/Restrictions Comments: L homonymous hemianopsia Restrictions Weight Bearing Restrictions Per Provider Order: No     Mobility  Bed Mobility Overal bed mobility: Needs Assistance Bed Mobility: Supine to Sit, Sit to Supine     Supine to sit: Supervision Sit to supine: Supervision        Transfers Overall transfer level: Needs assistance Equipment used: None Transfers: Sit to/from Stand Sit to Stand: Supervision                Ambulation/Gait Ambulation/Gait assistance: Contact guard assist Gait Distance  (Feet): 100 Feet (+250) Assistive device: None Gait Pattern/deviations: Narrow base of support, Decreased stride length, Step-through pattern Gait velocity: decr     General Gait Details: CGA for safety due to slightly increased instability with balance challenges   Stairs Stairs: Yes Stairs assistance: Contact guard assist Stair Management: One rail Right, Step to pattern, Alternating pattern Number of Stairs: 12 (x2) General stair comments: CGA for safety. Good strength, but some intermittent instability due to vision impairment. Pt demonstrating good use of scan and anchor technique   Wheelchair Mobility     Tilt Bed    Modified Rankin (Stroke Patients Only) Modified Rankin (Stroke Patients Only) Pre-Morbid Rankin Score: No symptoms Modified Rankin: Moderately severe disability     Balance Overall balance assessment: Needs assistance Sitting-balance support: No upper extremity supported, Feet supported Sitting balance-Leahy Scale: Good     Standing balance support: No upper extremity supported, During functional activity Standing balance-Leahy Scale: Good Standing balance comment: no AD                 Standardized Balance Assessment Standardized Balance Assessment : Dynamic Gait Index   Dynamic Gait Index Level Surface: Normal Change in Gait Speed: Mild Impairment Gait with Horizontal Head Turns: Mild Impairment Gait with Vertical Head Turns: Mild Impairment Gait and Pivot Turn: Normal Step Over Obstacle: Mild Impairment Step Around Obstacles: Mild Impairment Steps: Mild Impairment Total Score: 18      Communication Communication Communication: No apparent difficulties  Cognition Arousal: Alert Behavior During Therapy: WFL for tasks assessed/performed   PT - Cognitive impairments: Problem solving  Following commands: Intact      Cueing Cueing Techniques: Verbal cues  Exercises Other Exercises Other  Exercises: pointing at indicated objects on board with BUE on L and R    General Comments        Pertinent Vitals/Pain Pain Assessment Pain Assessment: No/denies pain     PT Goals (current goals can now be found in the care plan section) Acute Rehab PT Goals Patient Stated Goal: to get back to playing pickleball PT Goal Formulation: With patient/family Time For Goal Achievement: 09/23/23 Progress towards PT goals: Progressing toward goals    Frequency    Min 3X/week       AM-PAC PT 6 Clicks Mobility   Outcome Measure  Help needed turning from your back to your side while in a flat bed without using bedrails?: None Help needed moving from lying on your back to sitting on the side of a flat bed without using bedrails?: None Help needed moving to and from a bed to a chair (including a wheelchair)?: None Help needed standing up from a chair using your arms (e.g., wheelchair or bedside chair)?: None Help needed to walk in hospital room?: A Little Help needed climbing 3-5 steps with a railing? : A Little 6 Click Score: 22    End of Session Equipment Utilized During Treatment: Gait belt Activity Tolerance: Patient tolerated treatment well Patient left: in bed;with call bell/phone within reach;with family/visitor present;with bed alarm set Nurse Communication: Mobility status PT Visit Diagnosis: Unsteadiness on feet (R26.81);Other abnormalities of gait and mobility (R26.89);Muscle weakness (generalized) (M62.81)     Time: 9094-9069 PT Time Calculation (min) (ACUTE ONLY): 25 min  Charges:    $Gait Training: 23-37 mins PT General Charges $$ ACUTE PT VISIT: 1 Visit                    Darryle George, PTA Acute Rehabilitation Services Secure Chat Preferred  [phone]) [phone]    Darryle George 09/15/2023, 11:37 AM
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
## 2023-09-15 NOTE — Progress Notes (Signed)
 Occupational Therapy Treatment Patient Details Name: Terry Hays MRN: [id number] DOB: September 23, 1950 Today's Date: 09/15/2023   History of present illness Pt is a 73 yo male presenting to [facility] on 09/08/23 for confusion and L visual field deficits. MRI demonstrated R occipital lobe infarct. PMH of HLD, HTN, prediabetes, LVH   OT comments  Pt making steady progress towards OT goals this session. Pt continues to present with L homonymous hemianopsia impacting pts safety with ADLs. Session focus on various visual scanning tasks during dynamic balance challenges as well as static standing challenges. Educated pt on lighthouse visual scanning strategy and the importance of anchoring during a visual scanning task. Pt able to return demonstrate technique with great success with pt able to ambulate through the hallway while locating various visual stimuli on both sides of hallway. Discussed with pts family the environment of his home with family reporting how important it is for pt to be as independent as possible to be able to navigate home obstacles. Pt also is very high level at baseline and would benefit from the skilled intensity of an AIR facility to return to PLOF. Patient will benefit from intensive inpatient follow-up therapy, >3 hours/day.         If plan is discharge home, recommend the following:  Assist for transportation;Assistance with cooking/housework   Equipment Recommendations  None recommended by OT    Recommendations for Other Services      Precautions / Restrictions Precautions Precautions: Fall Recall of Precautions/Restrictions: Intact Precaution/Restrictions Comments: L homonymous hemianopsia Restrictions Weight Bearing Restrictions Per Provider Order: No       Mobility Bed Mobility Overal bed mobility: Modified Independent                  Transfers Overall transfer level: Needs assistance Equipment used: None Transfers: Sit to/from Stand Sit to Stand:  Supervision           General transfer comment: supervision for all functional mobility tasks with no AD     Balance Overall balance assessment: Needs assistance Sitting-balance support: No upper extremity supported, Feet supported Sitting balance-Leahy Scale: Good     Standing balance support: No upper extremity supported, During functional activity Standing balance-Leahy Scale: Good                             ADL either performed or assessed with clinical judgement   ADL Overall ADL's : Needs assistance/impaired                         Toilet Transfer: Supervision/safety;Ambulation Toilet Transfer Details (indicate cue type and reason): simulated via functional mobility with no AD         Functional mobility during ADLs: Supervision/safety General ADL Comments: ADL participation impacted by visual deficits    Extremity/Trunk Assessment Upper Extremity Assessment Upper Extremity Assessment: Overall WFL for tasks assessed   Lower Extremity Assessment Lower Extremity Assessment: Defer to PT evaluation   Cervical / Trunk Assessment Cervical / Trunk Assessment: Normal    Vision Baseline Vision/History: 1 Wears glasses Ability to See in Adequate Light: 0 Adequate Patient Visual Report: No change from baseline Additional Comments: education provided on lighthouse strategy and anchoring technique during functional mobility. pt also complete line bisection test while standing at mirror. pt completed task with 100% accuracy although mild R preference but pt aware of errors. education to wife on various other visual scanning strategies that  can utilize inluding issuing pt table top handouts to work on   Development worker, international aid: Within Scientific laboratory technician Praxis Praxis: Terry Hays   Communication Communication Communication: No apparent difficulties   Cognition Arousal: Alert Behavior During Therapy: WFL for tasks  assessed/performed Cognition: No apparent impairments                               Following commands: Intact        Cueing   Cueing Techniques: Verbal cues, Visual cues  Exercises      Shoulder Instructions       General Comments pts wife present during session, supportive and helpful    Pertinent Vitals/ Pain       Pain Assessment Pain Assessment: No/denies pain  Home Living                                          Prior Functioning/Environment              Frequency  Min 2X/week        Progress Toward Goals  OT Goals(current goals can now be found in the care plan section)  Progress towards OT goals: Progressing toward goals  Acute Rehab OT Goals Patient Stated Goal: to go to rehab OT Goal Formulation: With patient Time For Goal Achievement: 09/23/23 Potential to Achieve Goals: Good  Plan      Co-evaluation                 AM-PAC OT 6 Clicks Daily Activity     Outcome Measure   Help from another person eating meals?: None Help from another person taking care of personal grooming?: A Little Help from another person toileting, which includes using toliet, bedpan, or urinal?: A Little Help from another person bathing (including washing, rinsing, drying)?: A Little Help from another person to put on and taking off regular upper body clothing?: A Little Help from another person to put on and taking off regular lower body clothing?: A Little 6 Click Score: 19    End of Session Equipment Utilized During Treatment: Gait belt  OT Visit Diagnosis: Low vision, both eyes (H54.2);Unsteadiness on feet (R26.81);Other abnormalities of gait and mobility (R26.89)   Activity Tolerance Patient tolerated treatment well   Patient Left in bed;with call bell/phone within reach;with family/visitor present   Nurse Communication Mobility status        Time: 1123-1200 OT Time Calculation (min): 37 min  Charges: OT  General Charges $OT Visit: 1 Visit OT Treatments $Therapeutic Activity: 23-37 mins  [name], COTA/L Acute Rehabilitation Services [phone]   [name] 09/15/2023, 1:17 PM

## 2023-09-15 NOTE — Plan of Care (Signed)
  Problem: Education: Goal: Knowledge of General Education information will improve Description: Including pain rating scale, medication(s)/side effects and non-pharmacologic comfort measures Outcome: Progressing   Problem: Clinical Measurements: Goal: Will remain free from infection Outcome: Progressing   Problem: Clinical Measurements: Goal: Ability to maintain clinical measurements within normal limits will improve Outcome: Progressing   Problem: Coping: Goal: Level of anxiety will decrease Outcome: Progressing   Problem: Education: Goal: Knowledge of disease or condition will improve Outcome: Progressing   Problem: Ischemic Stroke/TIA Tissue Perfusion: Goal: Complications of ischemic stroke/TIA will be minimized Outcome: Progressing   Problem: Coping: Goal: Will identify appropriate support needs Outcome: Progressing

## 2023-09-16 ENCOUNTER — Encounter: Payer: Medicare PPO | Admitting: Family Medicine

## 2023-09-16 DIAGNOSIS — I63531 Cerebral infarction due to unspecified occlusion or stenosis of right posterior cerebral artery: Secondary | ICD-10-CM | POA: Diagnosis not present

## 2023-09-16 LAB — GLUCOSE, CAPILLARY
Glucose-Capillary: 196 mg/dL — ABNORMAL HIGH (ref 70–99)
Glucose-Capillary: 141 mg/dL — ABNORMAL HIGH (ref 70–99)
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
## 2023-09-16 NOTE — Plan of Care (Signed)
  Problem: Education: Goal: Knowledge of General Education information will improve Description: Including pain rating scale, medication(s)/side effects and non-pharmacologic comfort measures Outcome: Progressing   Problem: Health Behavior/Discharge Planning: Goal: Ability to manage health-related needs will improve Outcome: Progressing   Problem: Clinical Measurements: Goal: Ability to maintain clinical measurements within normal limits will improve Outcome: Progressing Goal: Will remain free from infection Outcome: Progressing Goal: Diagnostic test results will improve Outcome: Progressing Goal: Respiratory complications will improve Outcome: Progressing Goal: Cardiovascular complication will be avoided Outcome: Progressing   Problem: Activity: Goal: Risk for activity intolerance will decrease Outcome: Progressing   Problem: Nutrition: Goal: Adequate nutrition will be maintained Outcome: Progressing   Problem: Coping: Goal: Level of anxiety will decrease Outcome: Progressing   Problem: Elimination: Goal: Will not experience complications related to urinary retention Outcome: Progressing   Problem: Pain Managment: Goal: General experience of comfort will improve and/or be controlled Outcome: Progressing   Problem: Safety: Goal: Ability to remain free from injury will improve Outcome: Progressing   Problem: Skin Integrity: Goal: Risk for impaired skin integrity will decrease Outcome: Progressing   Problem: Education: Goal: Knowledge of disease or condition will improve Outcome: Progressing Goal: Knowledge of secondary prevention will improve (MUST DOCUMENT ALL) Outcome: Progressing Goal: Knowledge of patient specific risk factors will improve (DELETE if not current risk factor) Outcome: Progressing   Problem: Ischemic Stroke/TIA Tissue Perfusion: Goal: Complications of ischemic stroke/TIA will be minimized Outcome: Progressing   Problem: Coping: Goal:  Will verbalize positive feelings about self Outcome: Progressing Goal: Will identify appropriate support needs Outcome: Progressing   Problem: Health Behavior/Discharge Planning: Goal: Ability to manage health-related needs will improve Outcome: Progressing Goal: Goals will be collaboratively established with patient/family Outcome: Progressing   Problem: Self-Care: Goal: Ability to participate in self-care as condition permits will improve Outcome: Progressing Goal: Verbalization of feelings and concerns over difficulty with self-care will improve Outcome: Progressing Goal: Ability to communicate needs accurately will improve Outcome: Progressing   Problem: Nutrition: Goal: Risk of aspiration will decrease Outcome: Progressing Goal: Dietary intake will improve Outcome: Progressing

## 2023-09-16 NOTE — Progress Notes (Signed)
Inpatient Rehab Admissions Coordinator:    I continue to await insurance auth for CIR. I will follow for potential admit pending insurance auth.   Oliviya Gilkison, MS, CCC-SLP Rehab Admissions Coordinator  [phone] (celll) [phone] (office)  

## 2023-09-16 NOTE — H&P (Incomplete)
 Physical Medicine and Rehabilitation Admission H&P    Chief Complaint  Patient presents with   Functional deficits due to stroke    HPI:  Terry Hays is a 73 year old male with history of HTN, prostate cancer, dyslipidemia, ADHD,    ROS   Past Medical History:  Diagnosis Date   Abnormal ECG    Allergy    Anxiety    FLYING   Diverticulosis    Dyslipidemia    History of prostate cancer    Hyperlipidemia    Hypertension    LVH (left ventricular hypertrophy)    Pre-diabetes     Past Surgical History:  Procedure Laterality Date   COLONOSCOPY  2004 2011   MAGOD   EYE SURGERY  03/10/1999   CATOACTS BOTH   HERNIA REPAIR  03/09/1952   R INGHINAL   INGUINAL HERNIA REPAIR Left 04/07/2018   Procedure: OPERN LEFT INGUINAL HERNIA REPAIR WITH MESH ERAS PATHWAY;  Surgeon: Tanda Locus, MD;  Location: [facility];  Service: General;  Laterality: Left;   PENILE PROSTHESIS IMPLANT  03/09/2009   DAHLSTEDT   PROSTATE SURGERY  03/09/2001   PROSTRATECTOMY    Family History  Problem Relation Age of Onset   Heart attack Other    Hyperlipidemia Other    Cancer Other     Social History:   Married. reports that he quit smoking about 47 years ago. His smoking use included cigars and cigarettes. He has never used smokeless tobacco. He reports current alcohol use of about 1.0 standard drink of alcohol per week. He reports that he does not use drugs.    Allergies  Allergen Reactions   Tramadol Other (See Comments)    hallucinations   Medications Prior to Admission  Medication Sig Dispense Refill   ALPRAZolam  (XANAX ) 0.25 MG tablet TAKE 1 TABLET BY MOUTH AT BEDTIME AS NEEDED FOR ANXIETY (FOR flying) 30 tablet 1   amLODipine  (NORVASC ) 5 MG tablet TAKE 1 TABLET BY MOUTH DAILY 90 tablet 3   aspirin  EC 81 MG tablet Take 1 tablet (81 mg total) by mouth daily. 90 tablet 3   atorvastatin  (LIPITOR) 40 MG tablet TAKE 1 TABLET BY MOUTH DAILY 90 tablet 1   losartan  (COZAAR ) 50 MG  tablet TAKE 1 TABLET BY MOUTH DAILY 90 tablet 3   magnesium gluconate (MAGONATE) 500 MG tablet Take 500 mg by mouth daily.     potassium chloride  (KLOR-CON ) 10 MEQ tablet TAKE 1 TABLET BY MOUTH ONCE DAILY 90 tablet 3   TURMERIC PO Take 1,000 mg by mouth daily.     VYVANSE 10 MG CHEW Chew 1 tablet by mouth as needed.     VYVANSE 60 MG CHEW Chew by mouth.        Home: Home Living Family/patient expects to be discharged to:: Private residence Living Arrangements: Spouse/significant other Available Help at Discharge: Family, Available 24 hours/day Type of Home: House Home Access: Stairs to enter [employer] of Steps: 6 Entrance Stairs-Rails: Left, Right, Can reach both Home Layout: Multi-level, Bed/bath upstairs Alternate Level Stairs-Number of Steps: 14 steps to upstairs, 10 downstairs Alternate Level Stairs-Rails: Left, Right Bathroom Shower/Tub: Engineer, manufacturing systems: Standard Bathroom Accessibility: No Home Equipment: Hand held shower head 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[employer] With: Spouse   Functional History: Prior Function Prior Level of Function : Independent/Modified Independent, Driving Mobility Comments: no AD ADLs Comments: ind  Functional Status:  Mobility: Bed Mobility Overal bed mobility: Modified Independent Bed  Mobility: Supine to Sit, Sit to Supine Supine to sit: Supervision Sit to supine: Supervision General bed mobility comments: Sitting EOB upon arrival Transfers Overall transfer level: Needs assistance Equipment used: None Transfers: Sit to/from Stand Sit to Stand: Supervision General transfer comment: supervision for all functional mobility tasks with no AD Ambulation/Gait Ambulation/Gait assistance: Contact guard assist Gait Distance (Feet): 100 Feet (+250) Assistive device: None Gait Pattern/deviations: Narrow base of support, Decreased stride length, Step-through pattern General Gait Details: CGA for safety due  to slightly increased instability with balance challenges Gait velocity: decr Stairs: Yes Stairs assistance: Contact guard assist Stair Management: One rail Right, Step to pattern, Alternating pattern Number of Stairs: 12 (x2) General stair comments: CGA for safety. Good strength, but some intermittent instability due to vision impairment. Pt demonstrating good use of scan and anchor technique    ADL: ADL Overall ADL's : Needs assistance/impaired Eating/Feeding: Sitting, Set up Grooming: Wash/dry face, Standing, Contact guard assist Grooming Details (indicate cue type and reason): cues for visual compensation Upper Body Bathing: Sitting, Set up Lower Body Bathing: Set up, Sitting/lateral leans Upper Body Dressing : Sitting, Set up Upper Body Dressing Details (indicate cue type and reason): doff/don new gown Lower Body Dressing: Set up, Sitting/lateral leans Lower Body Dressing Details (indicate cue type and reason): don socks Toilet Transfer: Supervision/safety, Ambulation Toilet Transfer Details (indicate cue type and reason): simulated via functional mobility with no AD Toileting- Clothing Manipulation and Hygiene: Contact guard assist, Sit to/from stand Functional mobility during ADLs: Supervision/safety General ADL Comments: ADL participation impacted by visual deficits  Cognition: Cognition Overall Cognitive Status: History of cognitive impairments - at baseline Arousal/Alertness: Awake/alert Orientation Level: Oriented X4 Cognition Arousal: Alert Behavior During Therapy: WFL for tasks assessed/performed Overall Cognitive Status: History of cognitive impairments - at baseline  Physical Exam: Blood pressure (!) 149/62, pulse 70, temperature 98.3 F (36.8 C), temperature source Oral, resp. rate 16, height 5' 8 (1.727 m), weight 83 kg, SpO2 99%. Physical Exam  Results for orders placed or performed during the hospital encounter of 09/08/23 (from the past 48 hours)   Glucose, capillary     Status: Abnormal   Collection Time: 09/14/23  4:13 PM  Result Value Ref Range   Glucose-Capillary 151 (H) 70 - 99 mg/dL    Comment: Glucose reference range applies only to samples taken after fasting for at least 8 hours.  Glucose, capillary     Status: Abnormal   Collection Time: 09/14/23  8:32 PM  Result Value Ref Range   Glucose-Capillary 177 (H) 70 - 99 mg/dL    Comment: Glucose reference range applies only to samples taken after fasting for at least 8 hours.  Glucose, capillary     Status: Abnormal   Collection Time: 09/15/23  6:07 AM  Result Value Ref Range   Glucose-Capillary 141 (H) 70 - 99 mg/dL    Comment: Glucose reference range applies only to samples taken after fasting for at least 8 hours.  Basic metabolic panel with GFR     Status: Abnormal   Collection Time: 09/15/23  7:50 AM  Result Value Ref Range   Sodium 139 135 - 145 mmol/L   Potassium 4.1 3.5 - 5.1 mmol/L   Chloride 109 98 - 111 mmol/L   CO2 23 22 - 32 mmol/L   Glucose, Bld 121 (H) 70 - 99 mg/dL    Comment: Glucose reference range applies only to samples taken after fasting for at least 8 hours.   BUN 14 8 -  23 mg/dL   Creatinine, Ser 8.93 0.61 - 1.24 mg/dL   Calcium  9.0 8.9 - 10.3 mg/dL   GFR, Estimated >39 >39 mL/min    Comment: (NOTE) Calculated using the CKD-EPI Creatinine Equation (2021)    Anion gap 7 5 - 15    Comment: Performed at [facility], [address] [place], [place] [address]  CBC with Differential/Platelet     Status: Abnormal   Collection Time: 09/15/23  7:50 AM  Result Value Ref Range   WBC 6.7 4.0 - 10.5 K/uL   RBC 3.51 (L) 4.22 - 5.81 MIL/uL   Hemoglobin 10.6 (L) 13.0 - 17.0 g/dL   HCT 66.7 (L) 60.9 - 47.9 %   MCV 94.6 80.0 - 100.0 fL   MCH 30.2 26.0 - 34.0 pg   MCHC 31.9 30.0 - 36.0 g/dL   RDW 85.2 88.4 - 84.4 %   Platelets 322 150 - 400 K/uL   nRBC 0.0 0.0 - 0.2 %   Neutrophils Relative % 62 %   Neutro Abs 4.2 1.7 - 7.7 K/uL   Lymphocytes  Relative 22 %   Lymphs Abs 1.4 0.7 - 4.0 K/uL   Monocytes Relative 10 %   Monocytes Absolute 0.7 0.1 - 1.0 K/uL   Eosinophils Relative 4 %   Eosinophils Absolute 0.3 0.0 - 0.5 K/uL   Basophils Relative 1 %   Basophils Absolute 0.1 0.0 - 0.1 K/uL   Immature Granulocytes 1 %   Abs Immature Granulocytes 0.03 0.00 - 0.07 K/uL    Comment: Performed at [facility], [address], [place], [place] [address]  Magnesium     Status: None   Collection Time: 09/15/23  7:50 AM  Result Value Ref Range   Magnesium 2.3 1.7 - 2.4 mg/dL    Comment: Performed at [facility], [address] [place], [place] [address]  Glucose, capillary     Status: Abnormal   Collection Time: 09/15/23  5:05 PM  Result Value Ref Range   Glucose-Capillary 135 (H) 70 - 99 mg/dL    Comment: Glucose reference range applies only to samples taken after fasting for at least 8 hours.   Comment 1 Notify RN    Comment 2 Document in Chart   Glucose, capillary     Status: Abnormal   Collection Time: 09/15/23  9:10 PM  Result Value Ref Range   Glucose-Capillary 185 (H) 70 - 99 mg/dL    Comment: Glucose reference range applies only to samples taken after fasting for at least 8 hours.   Comment 1 Notify RN    Comment 2 Document in Chart   Glucose, capillary     Status: Abnormal   Collection Time: 09/16/23 11:26 AM  Result Value Ref Range   Glucose-Capillary 196 (H) 70 - 99 mg/dL    Comment: Glucose reference range applies only to samples taken after fasting for at least 8 hours.   Comment 1 Notify RN    Comment 2 Document in Chart    No results found.    Blood pressure (!) 149/62, pulse 70, temperature 98.3 F (36.8 C), temperature source Oral, resp. rate 16, height 5' 8 (1.727 m), weight 83 kg, SpO2 99%.  Medical Problem List and Plan: 1. Functional deficits secondary to ***  -patient may *** shower  -ELOS/Goals: *** 2.  Antithrombotics: -DVT/anticoagulation:  {VTE PROPHYLAXIS/ANTICOAGULATION -  [id number]}  -antiplatelet therapy: *** 3. Pain Management: *** 4. Mood/Behavior/Sleep: ***  -antipsychotic agents: ***  5. Neuropsych/cognition: This patient *** capable of making decisions on *** own behalf. 6. Skin/Wound Care: *** 7. Fluids/Electrolytes/Nutrition: ***  HTN: Monitor BP TID--continue Norvasc  5 mg and Cozaar  50 mg Constipation:  Normocytic anemia: Dr. Rosalie consulted per family request and discussed risks.  CT abdomen/pelvis negative and to follow up in 3-6 months once off Plavix .   --continue Iron supplement. Hgb 9.4 at admit-->9.7-->10.6  --PPI BID X one month followed by daily.  Impaired fasting glucose: Hgb A1C 5.5 w/BS low 120's with BMET.   --continue to monitor.  RLL nodule: Follow up primary for repeat imaging  ADHD: Vyvanse on hold per input from PCP  Prostate cancer s/p prostatectomy:     ***  Sharlet GORMAN Schmitz, PA-C 09/16/2023

## 2023-09-16 NOTE — Plan of Care (Signed)
  Problem: Education: Goal: Knowledge of General Education information will improve Description: Including pain rating scale, medication(s)/side effects and non-pharmacologic comfort measures Outcome: Progressing   Problem: Activity: Goal: Risk for activity intolerance will decrease Outcome: Progressing   Problem: Education: Goal: Knowledge of disease or condition will improve Outcome: Progressing Goal: Knowledge of secondary prevention will improve (MUST DOCUMENT ALL) Outcome: Progressing

## 2023-09-17 ENCOUNTER — Encounter (HOSPITAL_COMMUNITY): Payer: Self-pay | Admitting: Student

## 2023-09-17 ENCOUNTER — Encounter (HOSPITAL_COMMUNITY)
Admission: EM | Disposition: A | Discharge status: Skilled Nursing Facility | Payer: Self-pay | Source: Home / Self Care | Attending: Internal Medicine

## 2023-09-17 DIAGNOSIS — I639 Cerebral infarction, unspecified: Secondary | ICD-10-CM

## 2023-09-17 DIAGNOSIS — I63531 Cerebral infarction due to unspecified occlusion or stenosis of right posterior cerebral artery: Secondary | ICD-10-CM | POA: Diagnosis not present

## 2023-09-17 HISTORY — PX: LOOP RECORDER INSERTION: EP1214

## 2023-09-17 LAB — CBC WITH DIFFERENTIAL/PLATELET
Abs Immature Granulocytes: 0.02 K/uL (ref 0.00–0.07)
Basophils Absolute: 0 K/uL (ref 0.0–0.1)
Basophils Relative: 1 %
Eosinophils Absolute: 0.2 K/uL (ref 0.0–0.5)
Eosinophils Relative: 4 %
HCT: 35.4 % — ABNORMAL LOW (ref 39.0–52.0)
Hemoglobin: 11.1 g/dL — ABNORMAL LOW (ref 13.0–17.0)
Immature Granulocytes: 0 %
Lymphocytes Relative: 23 %
Lymphs Abs: 1.3 K/uL (ref 0.7–4.0)
MCH: 30.4 pg (ref 26.0–34.0)
MCHC: 31.4 g/dL (ref 30.0–36.0)
MCV: 97 fL (ref 80.0–100.0)
Monocytes Absolute: 0.5 K/uL (ref 0.1–1.0)
Monocytes Relative: 9 %
Neutro Abs: 3.7 K/uL (ref 1.7–7.7)
Neutrophils Relative %: 63 %
Platelets: 300 K/uL (ref 150–400)
RBC: 3.65 MIL/uL — ABNORMAL LOW (ref 4.22–5.81)
RDW: 14.6 % (ref 11.5–15.5)
WBC: 5.8 K/uL (ref 4.0–10.5)
nRBC: 0 % (ref 0.0–0.2)

## 2023-09-17 LAB — BASIC METABOLIC PANEL WITH GFR
Anion gap: 13 (ref 5–15)
BUN: 13 mg/dL (ref 8–23)
CO2: 17 mmol/L — ABNORMAL LOW (ref 22–32)
Calcium: 9.2 mg/dL (ref 8.9–10.3)
Chloride: 110 mmol/L (ref 98–111)
Creatinine, Ser: 0.95 mg/dL (ref 0.61–1.24)
GFR, Estimated: >60 mL/min (ref >60)
Glucose, Bld: 144 mg/dL — ABNORMAL HIGH (ref 70–99)
Potassium: 4.2 mmol/L (ref 3.5–5.1)
Sodium: 140 mmol/L (ref 135–145)

## 2023-09-17 SURGERY — LOOP RECORDER INSERTION
Anesthesia: LOCAL

## 2023-09-17 MED ORDER — LIDOCAINE-EPINEPHRINE 1 %-1:100000 IJ SOLN
INTRAMUSCULAR | Status: AC
  Filled 2023-09-17: qty 1

## 2023-09-17 MED ORDER — LIDOCAINE-EPINEPHRINE 1 %-1:100000 IJ SOLN
INTRAMUSCULAR | Status: DC | PRN
  Administered 2023-09-17: 10 mL

## 2023-09-17 SURGICAL SUPPLY — 2 items
MONITOR CARDIAC ASSERT IQ EL (Prosthesis & Implant Heart) IMPLANT
PACK LOOP INSERTION (CUSTOM PROCEDURE TRAY) ×1 IMPLANT

## 2023-09-17 NOTE — Plan of Care (Signed)
  Problem: Education: Goal: Knowledge of General Education information will improve Description: Including pain rating scale, medication(s)/side effects and non-pharmacologic comfort measures Outcome: Progressing   Problem: Clinical Measurements: Goal: Ability to maintain clinical measurements within normal limits will improve Outcome: Progressing   Problem: Activity: Goal: Risk for activity intolerance will decrease Outcome: Progressing   Problem: Education: Goal: Knowledge of disease or condition will improve Outcome: Progressing Goal: Knowledge of secondary prevention will improve (MUST DOCUMENT ALL) Outcome: Progressing Goal: Knowledge of patient specific risk factors will improve (DELETE if not current risk factor) Outcome: Progressing   Problem: Ischemic Stroke/TIA Tissue Perfusion: Goal: Complications of ischemic stroke/TIA will be minimized Outcome: Progressing

## 2023-09-17 NOTE — Plan of Care (Signed)
   Problem: Education: Goal: Knowledge of General Education information will improve Description Including pain rating scale, medication(s)/side effects and non-pharmacologic comfort measures Outcome: Progressing   Problem: Clinical Measurements: Goal: Ability to maintain clinical measurements within normal limits will improve Outcome: Progressing   Problem: Activity: Goal: Risk for activity intolerance will decrease Outcome: Progressing

## 2023-09-17 NOTE — TOC Progression Note (Signed)
 Transition of Care [facility]) - Progression Note    Patient Details  Name: Terry Hays MRN: [id number] Date of Birth: [birth year]  Transition of [facility]) CM/SW Contact  Andrez JULIANNA George, RN Phone Number: 09/17/2023, 3:53 PM  Clinical Narrative:     Still no answer on appeal for CIR from [facility].  CM did provide family with resources on SNF in area to look into.  TOC following.  Expected Discharge Plan: IP Rehab Facility Barriers to Discharge: Continued Medical Work up  Expected Discharge Plan and Services   Discharge Planning Services: CM Consult Post Acute Care Choice: IP Rehab Living arrangements for the past 2 months: Single Family Home                                       Social Determinants of Health (SDOH) Interventions SDOH Screenings   Food Insecurity: No Food Insecurity (09/08/2023)  Housing: Low Risk  (09/08/2023)  Transportation Needs: No Transportation Needs (09/08/2023)  Utilities: Not At Risk (09/08/2023)  Depression (PHQ2-9): Low Risk  (09/02/2022)  Financial Resource Strain: Low Risk  (09/02/2022)  Recent Concern: Financial Resource Strain - High Risk (09/02/2022)  Physical Activity: Sufficiently Active (05/30/2021)  Social Connections: Socially Integrated (09/08/2023)  Stress: No Stress Concern Present (09/02/2022)  Tobacco Use: Medium Risk (09/14/2023)    Readmission Risk Interventions     No data to display
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
## 2023-09-17 NOTE — Progress Notes (Signed)
 Inpatient Rehab Admissions Coordinator:  Awaiting decision regarding appeal. Will continue to follow.    Tinnie Yvone Cohens, MS, CCC-SLP Admissions Coordinator [phone]

## 2023-09-17 NOTE — Discharge Instructions (Signed)
 Care After Your Loop Recorder  You have a Abbott Loop Recorder   Monitor your cardiac device site for redness, swelling, and drainage. Call the device clinic at [phone] if you experience these symptoms or fever/chills.  If you notice bleeding from your site, hold firm, but gently pressure with two fingers for 5 minutes. Dried blood on the steri-strips when removing the outer bandage is normal.   Keep the large square bandage on your site for 24 hours and then you may remove it yourself. Keep the steri-strips underneath in place.   You may shower after 72 hours / 3 days from your procedure with the steri-strips in place. They will usually fall off on their own, or may be removed after 10 days. Pat dry.   Avoid lotions, ointments, or perfumes over your incision until it is well-healed.  Please do not submerge in water until your site is completely healed.   Your device is MRI compatible.   Remote monitoring is used to monitor your cardiac device from home. This monitoring is scheduled every month by our office. It allows [place] to keep an eye on the function of your device to ensure it is working properly.

## 2023-09-17 NOTE — Progress Notes (Signed)
 Physical Therapy Treatment Patient Details Name: Terry Hays MRN: [id number] DOB: [birth year] Today's Date: 09/17/2023   History of Present Illness Pt is a 73 yo male presenting [facility] for confusion and L visual field deficits. MRI demonstrated R occipital lobe infarct. PMH of HLD, HTN, prediabetes, LVH    PT Comments  Pt received in supine and agreeable to session. Pt able to complete gait and stair trials with up to CGA for safety. Pt demonstrates good carryover of scanning techniques, but requires increased time for adjustment at times. Pt able to participate in balance exercise with 1 LOB requiring assist to correct, but overall good stability. Pt continues to benefit from PT services to progress toward functional mobility goals.     If plan is discharge home, recommend the following: A little help with bathing/dressing/bathroom;Assistance with cooking/housework;Assist for transportation;Help with stairs or ramp for entrance;A little help with walking and/or transfers   Can travel by private vehicle        Equipment Recommendations  None recommended by PT    Recommendations for Other Services       Precautions / Restrictions Precautions Precautions: Fall Recall of Precautions/Restrictions: Intact Precaution/Restrictions Comments: L homonymous hemianopsia Restrictions Weight Bearing Restrictions Per Provider Order: No     Mobility  Bed Mobility Overal bed mobility: Modified Independent             General bed mobility comments: increased time    Transfers Overall transfer level: Needs assistance Equipment used: None Transfers: Sit to/from Stand Sit to Stand: Supervision                Ambulation/Gait Ambulation/Gait assistance: Contact guard assist Gait Distance (Feet): 100 Feet (x2) Assistive device: None Gait Pattern/deviations: Narrow base of support, Decreased stride length, Step-through pattern Gait velocity: decr     General Gait  Details: CGA for safety   Stairs Stairs: Yes Stairs assistance: Contact guard assist, Supervision Stair Management: One rail Right, Step to pattern, Alternating pattern Number of Stairs: 12 (x2) General stair comments: CGA progressing to supervision. increased caution with descent with increased effort for foot placement to ensure step clearance   Wheelchair Mobility     Tilt Bed    Modified Rankin (Stroke Patients Only) Modified Rankin (Stroke Patients Only) Pre-Morbid Rankin Score: No symptoms Modified Rankin: Moderately severe disability     Balance Overall balance assessment: Needs assistance Sitting-balance support: No upper extremity supported, Feet supported Sitting balance-Leahy Scale: Good     Standing balance support: No upper extremity supported, During functional activity Standing balance-Leahy Scale: Good Standing balance comment: no AD                            Communication Communication Communication: No apparent difficulties  Cognition Arousal: Alert Behavior During Therapy: WFL for tasks assessed/performed   PT - Cognitive impairments: Problem solving                         Following commands: Intact      Cueing Cueing Techniques: Verbal cues, Visual cues  Exercises Other Exercises Other Exercises: static standing with progressed narrow BOS with eyes closed and head turns 30 seconds x4    General Comments        Pertinent Vitals/Pain       PT Goals (current goals can now be found in the care plan section) Acute Rehab PT Goals Patient Stated Goal: to  get back to playing pickleball PT Goal Formulation: With patient/family Time For Goal Achievement: 09/23/23 Progress towards PT goals: Progressing toward goals    Frequency    Min 3X/week       AM-PAC PT 6 Clicks Mobility   Outcome Measure  Help needed turning from your back to your side while in a flat bed without using bedrails?: None Help needed  moving from lying on your back to sitting on the side of a flat bed without using bedrails?: None Help needed moving to and from a bed to a chair (including a wheelchair)?: None Help needed standing up from a chair using your arms (e.g., wheelchair or bedside chair)?: None Help needed to walk in hospital room?: A Little Help needed climbing 3-5 steps with a railing? : A Little 6 Click Score: 22    End of Session Equipment Utilized During Treatment: Gait belt Activity Tolerance: Patient tolerated treatment well Patient left: in bed;with call bell/phone within reach Nurse Communication: Mobility status PT Visit Diagnosis: Unsteadiness on feet (R26.81);Other abnormalities of gait and mobility (R26.89);Muscle weakness (generalized) (M62.81)     Time: 8597-8572 PT Time Calculation (min) (ACUTE ONLY): 25 min  Charges:    $Gait Training: 8-22 mins $Neuromuscular Re-education: 8-22 mins PT General Charges $$ ACUTE PT VISIT: 1 Visit                     Darryle George, PTA Acute Rehabilitation Services Secure Chat Preferred  [phone]    Darryle George 09/17/2023, 3:56 PM

## 2023-09-18 DIAGNOSIS — I63531 Cerebral infarction due to unspecified occlusion or stenosis of right posterior cerebral artery: Secondary | ICD-10-CM | POA: Diagnosis not present

## 2023-09-18 LAB — GLUCOSE, CAPILLARY: Glucose-Capillary: 117 mg/dL — ABNORMAL HIGH (ref 70–99)

## 2023-09-18 NOTE — Progress Notes (Signed)
 [facility] night cross cover note:   I was notified by the patient's RN that the patient refuses bedside assessments and vital sign checks between the hours of 2100 to 5 AM. He is amenable to assessment and VS checks outside of those hours.     Eva Pore, DO Hospitalist
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
## 2023-09-18 NOTE — Plan of Care (Signed)
 Pt is comfortably resting. Vitals and assessment at night were based on pt preference as handed over by AM Shift that pt wanted to be asleep between 9pm- 5am and MD's are also aware of it, also re-notified MD Howerter about it. Problem: Health Behavior/Discharge Planning: Goal: Ability to manage health-related needs will improve Outcome: Progressing   Problem: Nutrition: Goal: Adequate nutrition will be maintained Outcome: Progressing   Problem: Pain Managment: Goal: General experience of comfort will improve and/or be controlled Outcome: Progressing   Problem: Ischemic Stroke/TIA Tissue Perfusion: Goal: Complications of ischemic stroke/TIA will be minimized Outcome: Progressing   Problem: Self-Care: Goal: Ability to participate in self-care as condition permits will improve Outcome: Progressing   Problem: Safety: Goal: Ability to remain free from injury will improve Outcome: Progressing

## 2023-09-19 DIAGNOSIS — I63531 Cerebral infarction due to unspecified occlusion or stenosis of right posterior cerebral artery: Secondary | ICD-10-CM | POA: Diagnosis not present
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
## 2023-09-19 NOTE — Progress Notes (Signed)
 Patient taken outside by spouse on a wheelchair

## 2023-09-19 NOTE — Plan of Care (Signed)
  Problem: Health Behavior/Discharge Planning: Goal: Ability to manage health-related needs will improve Outcome: Progressing   Problem: Clinical Measurements: Goal: Will remain free from infection Outcome: Progressing   Problem: Activity: Goal: Risk for activity intolerance will decrease Outcome: Progressing   Problem: Nutrition: Goal: Adequate nutrition will be maintained Outcome: Progressing   Problem: Safety: Goal: Ability to remain free from injury will improve Outcome: Progressing   Problem: Skin Integrity: Goal: Risk for impaired skin integrity will decrease Outcome: Progressing   Problem: Ischemic Stroke/TIA Tissue Perfusion: Goal: Complications of ischemic stroke/TIA will be minimized Outcome: Progressing

## 2023-09-20 DIAGNOSIS — I63531 Cerebral infarction due to unspecified occlusion or stenosis of right posterior cerebral artery: Secondary | ICD-10-CM | POA: Diagnosis not present

## 2023-09-20 NOTE — Progress Notes (Signed)
 Occupational Therapy Treatment Patient Details Name: Terry Hays MRN: [id number] DOB: 1950-08-25 Today's Date: 09/20/2023   History of present illness Pt is a 73 yo male presenting to [facility] on 09/08/23 for confusion and L visual field deficits. MRI demonstrated R occipital lobe infarct. PMH of HLD, HTN, prediabetes, LVH   OT comments  Patient seated in recliner upon entry fully dressed except socks and shoes which patient was able to perform with setup. Patient performed mobility in hallway with cues for scanning strategies and patient able to recall anchoring and lighthouse visual scanning. Patient able to locate 2-3/4 items on left. Static standing scanning performed with increased accuracy. Patient able to take self to bathroom during session. Patient will benefit from intensive inpatient follow-up therapy, >3 hours/day.  Acute OT to continue to follow to address established goals to facilitate DC to next venue of care.        If plan is discharge home, recommend the following:  Assist for transportation;Assistance with cooking/housework   Equipment Recommendations  None recommended by OT    Recommendations for Other Services      Precautions / Restrictions Precautions Precautions: Fall Recall of Precautions/Restrictions: Intact Precaution/Restrictions Comments: L homonymous hemianopsia Restrictions Weight Bearing Restrictions Per Provider Order: No       Mobility Bed Mobility Overal bed mobility: Modified Independent             General bed mobility comments: OOB in recliner    Transfers Overall transfer level: Needs assistance Equipment used: None Transfers: Sit to/from Stand Sit to Stand: Supervision           General transfer comment: supervision with occasional cue for attention to left     Balance Overall balance assessment: Needs assistance Sitting-balance support: No upper extremity supported, Feet supported Sitting balance-Leahy Scale:  Good Sitting balance - Comments: in recliner   Standing balance support: No upper extremity supported, During functional activity Standing balance-Leahy Scale: Good Standing balance comment: no AD                           ADL either performed or assessed with clinical judgement   ADL Overall ADL's : Needs assistance/impaired     Grooming: Wash/dry hands;Wash/dry face;Supervision/safety;Standing               Lower Body Dressing: Set up;Sitting/lateral leans Lower Body Dressing Details (indicate cue type and reason): donned socks and shoes Toilet Transfer: Supervision/safety;Ambulation;Regular Teacher, adult education Details (indicate cue type and reason): no assistive device Toileting- Clothing Manipulation and Hygiene: Modified independent;Sitting/lateral lean       Functional mobility during ADLs: Supervision/safety General ADL Comments: able to use visual scanning technique to perform self care tasks and transfers in room    Extremity/Trunk Assessment              Vision       Perception     Praxis     Communication Communication Communication: No apparent difficulties   Cognition Arousal: Alert Behavior During Therapy: WFL for tasks assessed/performed Cognition: No apparent impairments                               Following commands: Intact        Cueing   Cueing Techniques: Verbal cues, Visual cues  Exercises      Shoulder Instructions       General Comments visual scanning techniques  performed in room and in hallway to address pathfinding    Pertinent Vitals/ Pain       Pain Assessment Pain Assessment: No/denies pain  Home Living                                          Prior Functioning/Environment              Frequency  Min 2X/week        Progress Toward Goals  OT Goals(current goals can now be found in the care plan section)  Progress towards OT goals: Progressing toward  goals  Acute Rehab OT Goals Patient Stated Goal: to go to inpatient rehab OT Goal Formulation: With patient Time For Goal Achievement: 09/23/23 Potential to Achieve Goals: Good ADL Goals Additional ADL Goal #1: Pt will demonstrate independent use of visual compensation strategies to locate 4/4 objects in L visual field Additional ADL Goal #2: Pt will verbalize understanding of fall prevention education.  Plan      Co-evaluation                 AM-PAC OT 6 Clicks Daily Activity     Outcome Measure   Help from another person eating meals?: None Help from another person taking care of personal grooming?: A Little Help from another person toileting, which includes using toliet, bedpan, or urinal?: A Little Help from another person bathing (including washing, rinsing, drying)?: A Little Help from another person to put on and taking off regular upper body clothing?: A Little Help from another person to put on and taking off regular lower body clothing?: A Little 6 Click Score: 19    End of Session Equipment Utilized During Treatment: Gait belt  OT Visit Diagnosis: Low vision, both eyes (H54.2);Unsteadiness on feet (R26.81);Other abnormalities of gait and mobility (R26.89)   Activity Tolerance Patient tolerated treatment well   Patient Left in chair;with call bell/phone within reach   Nurse Communication Mobility status        Time: 9251-9177 OT Time Calculation (min): 34 min  Charges: OT General Charges $OT Visit: 1 Visit OT Treatments $Self Care/Home Management : 8-22 mins $Therapeutic Activity: 8-22 mins  Dick Laine, OTA Acute Rehabilitation Services  Office [phone]   Jeb LITTIE Laine 09/20/2023, 9:53 AM

## 2023-09-20 NOTE — TOC CAGE-AID Note (Signed)
 Transition of Care [facility]) - CAGE-AID Screening   Patient Details  Name: Terry Hays MRN: [id number] Date of Birth: 1950/09/07  Transition of Care [facility]) CM/SW Contact:    Abegail Kloeppel E Sigmond Patalano, LCSW Phone Number: 09/20/2023, 4:15 PM   Clinical Narrative: No SA noted.  CAGE-AID Screening:               Substance Abuse Education Offered: No
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
## 2023-09-20 NOTE — Progress Notes (Signed)
 Physical Therapy Treatment Patient Details Name: Terry Hays MRN: [id number] DOB: March 23, 1950 Today's Date: 09/20/2023   History of Present Illness Pt is a 73 yo male presenting to [facility] on 09/08/23 for confusion and L visual field deficits. MRI demonstrated R occipital lobe infarct. PMH of HLD, HTN, prediabetes, LVH    PT Comments  Pt received in supine and agreeable to session. Pt able to tolerate gait and stair trials with up to CGA for safety. Pt demonstrates good use of lighthouse technique to locate objects in the hallway. Pt able to perform line step overs demonstrating good clearance and step length with CGA for safety. Pt demonstrates some memory deficits requiring increased cues during session. Pt continues to benefit from PT services to progress toward functional mobility goals.    If plan is discharge home, recommend the following: A little help with bathing/dressing/bathroom;Assistance with cooking/housework;Assist for transportation;Help with stairs or ramp for entrance;A little help with walking and/or transfers   Can travel by private vehicle        Equipment Recommendations  None recommended by PT    Recommendations for Other Services       Precautions / Restrictions Precautions Precautions: Fall Recall of Precautions/Restrictions: Intact Precaution/Restrictions Comments: L homonymous hemianopsia Restrictions Weight Bearing Restrictions Per Provider Order: No     Mobility  Bed Mobility Overal bed mobility: Modified Independent                  Transfers Overall transfer level: Needs assistance Equipment used: None Transfers: Sit to/from Stand Sit to Stand: Supervision                Ambulation/Gait Ambulation/Gait assistance: Contact guard assist Gait Distance (Feet): 275 Feet Assistive device: None Gait Pattern/deviations: Narrow base of support, Decreased stride length, Step-through pattern Gait velocity: decr     General Gait  Details: CGA for safety. Slowed pace, but no LOB with dual tasking   Stairs Stairs: Yes Stairs assistance: Contact guard assist, Supervision Stair Management: One rail Right, Step to pattern, Alternating pattern Number of Stairs: 12 (x2) General stair comments: CGA progressing to supervision. increased caution with descent   Wheelchair Mobility     Tilt Bed    Modified Rankin (Stroke Patients Only) Modified Rankin (Stroke Patients Only) Pre-Morbid Rankin Score: No symptoms Modified Rankin: Moderately severe disability     Balance Overall balance assessment: Needs assistance Sitting-balance support: No upper extremity supported, Feet supported Sitting balance-Leahy Scale: Good     Standing balance support: No upper extremity supported, During functional activity Standing balance-Leahy Scale: Good                              Communication Communication Communication: No apparent difficulties  Cognition Arousal: Alert Behavior During Therapy: WFL for tasks assessed/performed   PT - Cognitive impairments: Problem solving, Memory                         Following commands: Intact      Cueing Cueing Techniques: Verbal cues, Visual cues  Exercises Other Exercises Other Exercises: BLE forward and lateral step overs x5 each direction    General Comments General comments (skin integrity, edema, etc.): visual scanning techniques performed in room and in hallway to address pathfinding      Pertinent Vitals/Pain Pain Assessment Pain Assessment: No/denies pain     PT Goals (current goals can now be found in the  care plan section) Acute Rehab PT Goals Patient Stated Goal: to get back to playing pickleball PT Goal Formulation: With patient/family Time For Goal Achievement: 09/23/23 Progress towards PT goals: Progressing toward goals    Frequency    Min 3X/week       AM-PAC PT 6 Clicks Mobility   Outcome Measure  Help needed  turning from your back to your side while in a flat bed without using bedrails?: None Help needed moving from lying on your back to sitting on the side of a flat bed without using bedrails?: None Help needed moving to and from a bed to a chair (including a wheelchair)?: None Help needed standing up from a chair using your arms (e.g., wheelchair or bedside chair)?: None Help needed to walk in hospital room?: A Little Help needed climbing 3-5 steps with a railing? : A Little 6 Click Score: 22    End of Session Equipment Utilized During Treatment: Gait belt Activity Tolerance: Patient tolerated treatment well Patient left: in bed;with call bell/phone within reach Nurse Communication: Mobility status PT Visit Diagnosis: Unsteadiness on feet (R26.81);Other abnormalities of gait and mobility (R26.89);Muscle weakness (generalized) (M62.81)     Time: 8953-8879 PT Time Calculation (min) (ACUTE ONLY): 34 min  Charges:    $Gait Training: 23-37 mins PT General Charges $$ ACUTE PT VISIT: 1 Visit                     Darryle George, PTA Acute Rehabilitation Services Secure Chat Preferred  [phone]    Darryle George 09/20/2023, 12:48 PM

## 2023-09-20 NOTE — NC FL2 (Signed)
 [place]  MEDICAID FL2 LEVEL OF CARE FORM     IDENTIFICATION  Patient Name: Terry Hays Birthdate: [birth year] Sex: male Admission Date (Current Location): 09/08/2023  [facility] and [place] Number:  Producer, television/film/video and Address:  [facility], [address], [place], [place] [address]      Provider Number: [id number]  Attending Physician Name and Address:  Lue Elsie BROCKS, MD  Relative Name and Phone Number:  Kejuan, Bekker [phone]  [phone]    Current Level of Care: Hospital Recommended Level of Care: Skilled Nursing Facility Prior Approval Number:    Date Approved/Denied:   PASRR Number:    Discharge Plan: SNF    Current Diagnoses: Patient Active Problem List   Diagnosis Date Noted   Cognitive impairment 09/12/2023   Acute CVA (cerebrovascular accident) (HCC) 09/09/2023   Acute cerebrovascular accident (CVA) due to occlusion of right posterior cerebral artery (HCC) 09/08/2023   Normocytic anemia 09/08/2023   Right lower lobe pulmonary nodule 09/08/2023   Attention deficit hyperactivity disorder (ADHD), predominantly hyperactive type 06/03/2021   Cherry angioma 06/03/2021   Seborrheic keratosis 06/03/2021   Arthritis 03/17/2018   Spinal stenosis of lumbosacral region 03/17/2018   Actinic keratosis 03/17/2018   Degeneration of lumbar intervertebral disc 10/27/2017   History of prostate cancer 09/15/2011   Fear of flying 09/15/2011   Family history of heart disease in male family member before age 10 09/15/2011   Allergic rhinitis 09/15/2011   Hemorrhoids 09/15/2011   Hypertension 06/27/2010   Dyslipidemia    LVH (left ventricular hypertrophy)     Orientation RESPIRATION BLADDER Height & Weight     Self, Time, Situation, Place  Normal Continent Weight: 83 kg Height:  5' 8 (172.7 cm)  BEHAVIORAL SYMPTOMS/MOOD NEUROLOGICAL BOWEL NUTRITION STATUS      Continent Diet (heart healthy with thin liquids)   AMBULATORY STATUS COMMUNICATION OF NEEDS Skin   Limited Assist Verbally Normal                       Personal Care Assistance Level of Assistance  Bathing, Feeding, Dressing Bathing Assistance: Limited assistance Feeding assistance: Independent Dressing Assistance: Limited assistance     Functional Limitations Info  Sight Sight Info: Adequate        SPECIAL CARE FACTORS FREQUENCY  PT (By licensed PT), OT (By licensed OT), Speech therapy     PT Frequency: 5x/wk OT Frequency: 5x/wk     Speech Therapy Frequency: 5x/wk      Contractures Contractures Info: Not present    Additional Factors Info  Code Status, Allergies Code Status Info: Full Allergies Info: Tramadol           Current Medications (09/20/2023):  This is the current hospital active medication list Current Facility-Administered Medications  Medication Dose Route Frequency Provider Last Rate Last Admin   acetaminophen  (TYLENOL ) tablet 650 mg  650 mg Oral Q4H PRN Patel, Vishal R, MD       Or   acetaminophen  (TYLENOL ) 160 MG/5ML solution 650 mg  650 mg Per Tube Q4H PRN Patel, Vishal R, MD       Or   acetaminophen  (TYLENOL ) suppository 650 mg  650 mg Rectal Q4H PRN Patel, Vishal R, MD       amLODipine  (NORVASC ) tablet 5 mg  5 mg Oral Daily Patel, Vishal R, MD   5 mg at 09/20/23 0836   aspirin  EC tablet 81 mg  81 mg Oral Daily Tobie Bloch  R, MD   81 mg at 09/20/23 0836   atorvastatin  (LIPITOR) tablet 40 mg  40 mg Oral Daily Patel, Vishal R, MD   40 mg at 09/20/23 0836   clopidogrel  (PLAVIX ) tablet 75 mg  75 mg Oral Daily Patel, Vishal R, MD   75 mg at 09/20/23 9163   ferrous sulfate  tablet 325 mg  325 mg Oral Q breakfast Patsy Lenis, MD   325 mg at 09/20/23 9163   gadobutrol  (GADAVIST ) 1 MMOL/ML injection 8 mL  8 mL Intravenous Once PRN Tegeler, Lonni PARAS, MD       losartan  (COZAAR ) tablet 50 mg  50 mg Oral Daily Patel, Vishal R, MD   50 mg at 09/20/23 9163   pantoprazole  (PROTONIX ) EC tablet 40  mg  40 mg Oral BID Patel, Vishal R, MD   40 mg at 09/20/23 0836   polyethylene glycol (MIRALAX  / GLYCOLAX ) packet 17 g  17 g Oral Daily Patsy Lenis, MD   17 g at 09/20/23 9161   senna-docusate (Senokot-S) tablet 1 tablet  1 tablet Oral BID Patsy Lenis, MD   1 tablet at 09/20/23 9163     Discharge Medications: Please see discharge summary for a list of discharge medications.  Relevant Imaging Results:  Relevant Lab Results:   Additional Information SS#: [id number]  Andrez JULIANNA George, RN

## 2023-09-20 NOTE — TOC Progression Note (Addendum)
 Transition of Care [facility]) - Progression Note    Patient Details  Name: Terry Hays MRN: [id number] Date of Birth: [birth year]  Transition of [facility]) CM/SW Contact  Andrez JULIANNA George, RN Phone Number: 09/20/2023, 1:34 PM  Clinical Narrative:     Pt has been denied for CIR under appeal. Son asked that he be faxed out for SNF in [facility]. CM will provide bed offers later today.  IP Care Management following.  1625: [facility] offered and family accepted. CM has asked [facility] to begin insurance auth for a rehab admission.  Expected Discharge Plan: IP Rehab Facility Barriers to Discharge: Continued Medical Work up  Expected Discharge Plan and Services   Discharge Planning Services: CM Consult Post Acute Care Choice: IP Rehab Living arrangements for the past 2 months: Single Family Home                                       Social Determinants of Health (SDOH) Interventions SDOH Screenings   Food Insecurity: No Food Insecurity (09/08/2023)  Housing: Low Risk  (09/08/2023)  Transportation Needs: No Transportation Needs (09/08/2023)  Utilities: Not At Risk (09/08/2023)  Depression (PHQ2-9): Low Risk  (09/02/2022)  Financial Resource Strain: Low Risk  (09/02/2022)  Recent Concern: Financial Resource Strain - High Risk (09/02/2022)  Physical Activity: Sufficiently Active (05/30/2021)  Social Connections: Socially Integrated (09/08/2023)  Stress: No Stress Concern Present (09/02/2022)  Tobacco Use: Medium Risk (09/14/2023)    Readmission Risk Interventions     No data to display

## 2023-09-20 NOTE — Progress Notes (Addendum)
 Inpatient Rehab Admissions Coordinator:   Expedited appeal was denied. I notified Pt.'s son. He was told by [facility] that the next step was an appeal to [employer].  I heard from [employer] and they upheld the denial. I notified Pt.'s son and Advised them to consider HH vs. OP vs. SNF. TOC notified and CIR will sign off.   Leita Kleine, MS, CCC-SLP Rehab Admissions Coordinator  [phone] (celll) [phone] (office)

## 2023-09-21 DIAGNOSIS — I63531 Cerebral infarction due to unspecified occlusion or stenosis of right posterior cerebral artery: Secondary | ICD-10-CM | POA: Diagnosis not present

## 2023-09-21 DIAGNOSIS — D649 Anemia, unspecified: Secondary | ICD-10-CM | POA: Diagnosis not present

## 2023-09-21 DIAGNOSIS — F909 Attention-deficit hyperactivity disorder, unspecified type: Secondary | ICD-10-CM | POA: Diagnosis not present

## 2023-09-21 DIAGNOSIS — H547 Unspecified visual loss: Secondary | ICD-10-CM | POA: Diagnosis not present

## 2023-09-21 DIAGNOSIS — Z8719 Personal history of other diseases of the digestive system: Secondary | ICD-10-CM | POA: Diagnosis not present

## 2023-09-21 DIAGNOSIS — F429 Obsessive-compulsive disorder, unspecified: Secondary | ICD-10-CM | POA: Diagnosis not present

## 2023-09-21 DIAGNOSIS — Z8673 Personal history of transient ischemic attack (TIA), and cerebral infarction without residual deficits: Secondary | ICD-10-CM | POA: Diagnosis not present

## 2023-09-21 MED ORDER — SENNOSIDES-DOCUSATE SODIUM 8.6-50 MG PO TABS: 1.0000 | ORAL_TABLET | Freq: Two times a day (BID) | ORAL | 0 refills | Status: AC

## 2023-09-21 MED ORDER — FERROUS SULFATE 325 (65 FE) MG PO TABS: 325.0000 mg | ORAL_TABLET | Freq: Every day | ORAL | 3 refills | Status: DC

## 2023-09-21 MED ORDER — CLOPIDOGREL BISULFATE 75 MG PO TABS: 75.0000 mg | ORAL_TABLET | Freq: Every day | ORAL | 0 refills | Status: AC

## 2023-09-21 MED ORDER — PANTOPRAZOLE SODIUM 40 MG PO TBEC: 40.0000 mg | DELAYED_RELEASE_TABLET | Freq: Two times a day (BID) | ORAL | 0 refills | Status: DC

## 2023-09-21 NOTE — TOC Transition Note (Signed)
 Transition of Care [facility]) - Discharge Note   Patient Details  Name: Terry Hays MRN: [id number] Date of Birth: 1950-07-30  Transition of Care [facility]) CM/SW Contact:  Andrez JULIANNA George, RN Phone Number: 09/21/2023, 1:15 PM   Clinical Narrative:     Pts family has decided on [facility] and are willing to private pay as insurance is still pending. [facility] has accepted and has bed available. Pt will go to room 115.  Family is going to provide transportation to [facility].   Number for report: [phone]   Final next level of care: Skilled Nursing Facility Barriers to Discharge: No Barriers Identified   Patient Goals and CMS Choice   CMS Medicare.gov Compare Post Acute Care list provided to:: Patient Choice offered to / list presented to : Spouse, Adult Children, Patient      Discharge Placement                       Discharge Plan and Services Additional resources added to the After Visit Summary for     Discharge Planning Services: CM Consult Post Acute Care Choice: IP Rehab                               Social Drivers of Health (SDOH) Interventions SDOH Screenings   Food Insecurity: No Food Insecurity (09/08/2023)  Housing: Low Risk  (09/08/2023)  Transportation Needs: No Transportation Needs (09/08/2023)  Utilities: Not At Risk (09/08/2023)  Depression (PHQ2-9): Low Risk  (09/02/2022)  Financial Resource Strain: Low Risk  (09/02/2022)  Recent Concern: Financial Resource Strain - High Risk (09/02/2022)  Physical Activity: Sufficiently Active (05/30/2021)  Social Connections: Socially Integrated (09/08/2023)  Stress: No Stress Concern Present (09/02/2022)  Tobacco Use: Medium Risk (09/14/2023)     Readmission Risk Interventions     No data to display

## 2023-09-21 NOTE — Discharge Summary (Signed)
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 nephroureterolithiasis on either side. Urinary bladder is under distended, precluding optimal assessment. However, no large mass or stones identified. No perivesical fat stranding. Stomach/Bowel: No disproportionate dilation of the small or large bowel loops. No evidence of abnormal bowel wall thickening or inflammatory changes. The appendix is unremarkable. There are multiple diverticula mainly in the sigmoid colon, without imaging signs of diverticulitis. Vascular/Lymphatic: No ascites or pneumoperitoneum. No abdominal or pelvic lymphadenopathy, by size criteria. No aneurysmal dilation of the major abdominal arteries. There are moderate peripheral atherosclerotic vascular calcifications of the aorta and its major branches. Reproductive: Small/atrophic versus surgically absent prostate gland. Penile implant noted. Other: There is a tiny umbilical hernia containing portion of unobstructed small-bowel. The soft tissues and abdominal wall are otherwise unremarkable. Musculoskeletal: No suspicious osseous lesions. There are moderate multilevel degenerative changes in the visualized spine. IMPRESSION: 1. No acute inflammatory process identified within the abdomen or pelvis. 2. Multiple other nonacute  observations, as described above. 3. There is a 3 x 5 mm solid noncalcified nodule in the right lung lower lobe. Please see follow-up recommendations below. Aortic Atherosclerosis (ICD10-I70.0). Pulmonary nodule  follow-up recommendation: Solid lung nodule measuring up to 6 mm: - LOW RISK: No routine follow-up. - HIGHER RISK: Optional CT at 12 months. Recommendations for evaluation of incidental nodules derived from guidelines developed by the Fleischner Society ( Radiology 2017; [phone]). These guidelines apply to incidental nodules, which can be managed according to the specific recommendations. These guidelines do not apply to patient's younger than 35 years, immunocompromised patients, or patients with cancer. For lung cancer screening, adherence to the existing [employer] of radiology lung CT Screening Reporting and Data System (lung-RADS) guidelines is recommended. High risk factors include older age, heavy smoking, larger nodule size, irregular or spiculated margins and upper lobe location. Clinical risk factors include smoking, exposure to other carcinogens, emphysema, fibrosis, upper lobe location, family history of lung cancer, age and sex. Electronically Signed   By: Ree Molt M.D.   On: 09/08/2023 13:32   DG Chest Portable 1 View Result Date: 09/08/2023 CLINICAL DATA:  AMS EXAM: PORTABLE CHEST - 1 VIEW COMPARISON:  February 18, 2007 FINDINGS: No focal airspace consolidation, pleural effusion, or pneumothorax. Mild cardiomegaly.No acute fracture or destructive lesion. Bilateral AC joint osteoarthritis. IMPRESSION: No acute cardiopulmonary abnormality. Electronically Signed   By: Rogelia Myers M.D.   On: 09/08/2023 12:18     Subjective: No acute issues or events reported denies nausea vomit diarrhea constipation history of chest pain   Discharge Exam: Vitals:   09/20/23 1930 09/21/23 0751  BP: 137/63 (!) 140/47  Pulse: 64 63  Resp: 18 16  Temp:  98.3 F (36.8 C)  SpO2: 94%  96%   Vitals:   09/19/23 2010 09/20/23 0806 09/20/23 1930 09/21/23 0751  BP: (!) 126/52 134/74 137/63 (!) 140/47  Pulse: 60 66 64 63  Resp: 18 19 18 16   Temp:  97.7 F (36.5 C) 98.2 F (36.8 C) 98.3 F (36.8 C)  TempSrc: Oral Oral Oral Oral  SpO2: 93% 97% 94% 96%  Weight:      Height:        General: Pt is alert, awake, not in acute distress Cardiovascular: RRR, S1/S2 +, no rubs, no gallops Respiratory: CTA bilaterally, no wheezing, no rhonchi Abdominal: Soft, NT, ND, bowel sounds + Extremities: no edema, no cyanosis    The results of significant diagnostics from this hospitalization (including imaging, microbiology, ancillary and laboratory) are listed below for reference.     Microbiology: No results found for this or any previous visit (from the past 240 hours).   Labs: BNP (last 3 results) No results for input(s): BNP in the last 8760 hours. Basic Metabolic Panel: Recent Labs  Lab 09/15/23 0750 09/17/23 1015  NA 139 140  K 4.1 4.2  CL 109 110  CO2 23 17*  GLUCOSE 121* 144*  BUN 14 13  CREATININE 1.06 0.95  CALCIUM  9.0 9.2  MG 2.3  --    Liver Function Tests: No results for input(s): AST, ALT, ALKPHOS, BILITOT, PROT, ALBUMIN in the last 168 hours. No results for input(s): LIPASE, AMYLASE in the last 168 hours. No results for input(s): AMMONIA in the last 168 hours. CBC: Recent Labs  Lab 09/15/23 0750 09/17/23 1015  WBC 6.7 5.8  NEUTROABS 4.2 3.7  HGB 10.6* 11.1*  HCT 33.2* 35.4*  MCV 94.6 97.0  PLT 322 300   Cardiac Enzymes: No results for input(s): CKTOTAL, CKMB, CKMBINDEX, TROPONINI in the last 168 hours. BNP: Invalid input(s): POCBNP CBG: Recent Labs  Lab 09/15/23 1705 09/15/23 2110 09/16/23 1126 09/16/23 2107 09/18/23 9374  GLUCAP 135* 185* 196* 141* 117*   D-Dimer No results for input(s): DDIMER in the last 72 hours. Hgb A1c No results for input(s): HGBA1C in the last 72 hours. Lipid  Profile No results for input(s): CHOL, HDL, LDLCALC, TRIG, CHOLHDL, LDLDIRECT in the last 72 hours. Thyroid function studies No results for input(s): TSH, T4TOTAL, T3FREE, THYROIDAB in the last 72 hours.  Invalid input(s): FREET3 Anemia work up No results for input(s): VITAMINB12, FOLATE, FERRITIN, TIBC, IRON, RETICCTPCT in the last 72 hours. Urinalysis    Component Value Date/Time   COLORURINE YELLOW 09/08/2023 1145   APPEARANCEUR CLEAR 09/08/2023 1145   LABSPEC 1.006 09/08/2023 1145   LABSPEC 1.010 05/15/2019 1128   PHURINE 5.5 09/08/2023 1145   GLUCOSEU NEGATIVE 09/08/2023 1145   HGBUR NEGATIVE 09/08/2023 1145   BILIRUBINUR NEGATIVE 09/08/2023 1145   BILIRUBINUR negative 05/15/2019 1128   BILIRUBINUR n 07/25/2015 0947   KETONESUR NEGATIVE 09/08/2023 1145   PROTEINUR NEGATIVE 09/08/2023 1145   UROBILINOGEN negative 07/25/2015 0947   NITRITE NEGATIVE 09/08/2023 1145   LEUKOCYTESUR NEGATIVE 09/08/2023 1145   Sepsis Labs Recent Labs  Lab 09/15/23 0750 09/17/23 1015  WBC 6.7 5.8   Microbiology No results found for this or any previous visit (from the past 240 hours).   Time coordinating discharge: Over 30 minutes  SIGNED:   Elsie JAYSON Montclair, DO [facility] 09/21/2023, 12:26 PM Pager   If 7PM-7AM, please contact night-coverage [email or web address]

## 2023-09-22 DIAGNOSIS — R41841 Cognitive communication deficit: Secondary | ICD-10-CM | POA: Diagnosis not present

## 2023-09-22 DIAGNOSIS — R4189 Other symptoms and signs involving cognitive functions and awareness: Secondary | ICD-10-CM | POA: Diagnosis not present

## 2023-09-22 DIAGNOSIS — R2689 Other abnormalities of gait and mobility: Secondary | ICD-10-CM | POA: Diagnosis not present

## 2023-09-22 DIAGNOSIS — M6281 Muscle weakness (generalized): Secondary | ICD-10-CM | POA: Diagnosis not present

## 2023-09-22 DIAGNOSIS — H53462 Homonymous bilateral field defects, left side: Secondary | ICD-10-CM | POA: Diagnosis not present

## 2023-09-22 DIAGNOSIS — F901 Attention-deficit hyperactivity disorder, predominantly hyperactive type: Secondary | ICD-10-CM | POA: Diagnosis not present

## 2023-09-22 DIAGNOSIS — I69398 Other sequelae of cerebral infarction: Secondary | ICD-10-CM | POA: Diagnosis not present

## 2023-09-23 ENCOUNTER — Telehealth: Payer: Self-pay

## 2023-09-23 DIAGNOSIS — M6281 Muscle weakness (generalized): Secondary | ICD-10-CM | POA: Diagnosis not present

## 2023-09-23 DIAGNOSIS — H53462 Homonymous bilateral field defects, left side: Secondary | ICD-10-CM | POA: Diagnosis not present

## 2023-09-23 DIAGNOSIS — F901 Attention-deficit hyperactivity disorder, predominantly hyperactive type: Secondary | ICD-10-CM | POA: Diagnosis not present

## 2023-09-23 DIAGNOSIS — R2689 Other abnormalities of gait and mobility: Secondary | ICD-10-CM | POA: Diagnosis not present

## 2023-09-23 DIAGNOSIS — R4189 Other symptoms and signs involving cognitive functions and awareness: Secondary | ICD-10-CM | POA: Diagnosis not present

## 2023-09-23 DIAGNOSIS — R41841 Cognitive communication deficit: Secondary | ICD-10-CM | POA: Diagnosis not present

## 2023-09-23 DIAGNOSIS — I69398 Other sequelae of cerebral infarction: Secondary | ICD-10-CM | POA: Diagnosis not present

## 2023-09-23 NOTE — Telephone Encounter (Signed)
-----   Message from Ozell Barter Tillery sent at 09/17/2023 11:40 AM EDT ----- Regarding: Same Day Discharge [facility] LOOP 09/17/23 Dr. Kennyth to follow

## 2023-09-23 NOTE — Telephone Encounter (Signed)
  Loop Recorder Follow up   Is patient connected to [facility]? Yes   Have steri-strips fallen off or been removed? No   Does the patient need in office follow up? No   Please continue to monitor your cardiac device site for redness, swelling, and drainage. Call the device clinic at [phone] if you experience these symptoms, fever/chills, or have questions about your device.   Remote monitoring is used to monitor your cardiac device from home. This monitoring is scheduled every month by our office. It allows us  to keep an eye on the functioning of your device to ensure it is working properly.   Pt states he is doing well so far. He has no questions.

## 2023-09-24 DIAGNOSIS — H53462 Homonymous bilateral field defects, left side: Secondary | ICD-10-CM | POA: Diagnosis not present

## 2023-09-24 DIAGNOSIS — I69398 Other sequelae of cerebral infarction: Secondary | ICD-10-CM | POA: Diagnosis not present

## 2023-09-24 DIAGNOSIS — R41841 Cognitive communication deficit: Secondary | ICD-10-CM | POA: Diagnosis not present

## 2023-09-24 DIAGNOSIS — R2689 Other abnormalities of gait and mobility: Secondary | ICD-10-CM | POA: Diagnosis not present

## 2023-09-24 DIAGNOSIS — R4189 Other symptoms and signs involving cognitive functions and awareness: Secondary | ICD-10-CM | POA: Diagnosis not present

## 2023-09-24 DIAGNOSIS — M6281 Muscle weakness (generalized): Secondary | ICD-10-CM | POA: Diagnosis not present

## 2023-09-24 DIAGNOSIS — F901 Attention-deficit hyperactivity disorder, predominantly hyperactive type: Secondary | ICD-10-CM | POA: Diagnosis not present

## 2023-09-27 ENCOUNTER — Encounter: Payer: Self-pay | Admitting: Family Medicine

## 2023-09-27 DIAGNOSIS — R2689 Other abnormalities of gait and mobility: Secondary | ICD-10-CM | POA: Diagnosis not present

## 2023-09-27 DIAGNOSIS — H53462 Homonymous bilateral field defects, left side: Secondary | ICD-10-CM | POA: Diagnosis not present

## 2023-09-27 DIAGNOSIS — R41841 Cognitive communication deficit: Secondary | ICD-10-CM | POA: Diagnosis not present

## 2023-09-27 DIAGNOSIS — I69398 Other sequelae of cerebral infarction: Secondary | ICD-10-CM | POA: Diagnosis not present

## 2023-09-27 DIAGNOSIS — R4189 Other symptoms and signs involving cognitive functions and awareness: Secondary | ICD-10-CM | POA: Diagnosis not present

## 2023-09-27 DIAGNOSIS — M6281 Muscle weakness (generalized): Secondary | ICD-10-CM | POA: Diagnosis not present

## 2023-09-27 DIAGNOSIS — F901 Attention-deficit hyperactivity disorder, predominantly hyperactive type: Secondary | ICD-10-CM | POA: Diagnosis not present

## 2023-09-28 DIAGNOSIS — R41841 Cognitive communication deficit: Secondary | ICD-10-CM | POA: Diagnosis not present

## 2023-09-28 DIAGNOSIS — R4189 Other symptoms and signs involving cognitive functions and awareness: Secondary | ICD-10-CM | POA: Diagnosis not present

## 2023-09-28 DIAGNOSIS — R2689 Other abnormalities of gait and mobility: Secondary | ICD-10-CM | POA: Diagnosis not present

## 2023-09-28 DIAGNOSIS — H53462 Homonymous bilateral field defects, left side: Secondary | ICD-10-CM | POA: Diagnosis not present

## 2023-09-28 DIAGNOSIS — I69398 Other sequelae of cerebral infarction: Secondary | ICD-10-CM | POA: Diagnosis not present

## 2023-09-28 DIAGNOSIS — M6281 Muscle weakness (generalized): Secondary | ICD-10-CM | POA: Diagnosis not present

## 2023-09-28 DIAGNOSIS — F901 Attention-deficit hyperactivity disorder, predominantly hyperactive type: Secondary | ICD-10-CM | POA: Diagnosis not present

## 2023-09-29 DIAGNOSIS — R2689 Other abnormalities of gait and mobility: Secondary | ICD-10-CM | POA: Diagnosis not present

## 2023-09-29 DIAGNOSIS — F901 Attention-deficit hyperactivity disorder, predominantly hyperactive type: Secondary | ICD-10-CM | POA: Diagnosis not present

## 2023-09-29 DIAGNOSIS — I69398 Other sequelae of cerebral infarction: Secondary | ICD-10-CM | POA: Diagnosis not present

## 2023-09-29 DIAGNOSIS — R4189 Other symptoms and signs involving cognitive functions and awareness: Secondary | ICD-10-CM | POA: Diagnosis not present

## 2023-09-29 DIAGNOSIS — M6281 Muscle weakness (generalized): Secondary | ICD-10-CM | POA: Diagnosis not present

## 2023-09-29 DIAGNOSIS — R41841 Cognitive communication deficit: Secondary | ICD-10-CM | POA: Diagnosis not present

## 2023-09-29 DIAGNOSIS — H53462 Homonymous bilateral field defects, left side: Secondary | ICD-10-CM | POA: Diagnosis not present

## 2023-09-30 ENCOUNTER — Other Ambulatory Visit: Payer: Self-pay | Admitting: Family Medicine

## 2023-09-30 ENCOUNTER — Telehealth: Payer: Self-pay | Admitting: Family Medicine

## 2023-09-30 DIAGNOSIS — I63531 Cerebral infarction due to unspecified occlusion or stenosis of right posterior cerebral artery: Secondary | ICD-10-CM

## 2023-09-30 NOTE — Telephone Encounter (Signed)
 Copied from CRM [id number]. Topic: Referral - Request for Referral >> Sep 30, 2023  9:15 AM Selinda RAMAN wrote: Did the patient discuss referral with their provider in the last year? No   Appointment offered? Yes the patient has a hospital follow up next week with Dr Randol  Type of order/referral and detailed reason for visit: Referral for vision issues with 70% vision in left eye and 30% vision in right eye. The daughter was told that Dr Cleatus works with a Neuro Occupational therapist and that is what her father needs after his stroke.  Preference of office, provider, location: Dr Lamarr Cleatus [place], [place]  If referral order, have you been seen by this specialty before? No (If Yes, this issue or another issue? When? Where?  Can we respond through MyChart? Yes  Please assist patient further as this was briefly discussed in my chart messages. The patient has an hospital follow up appt next week with Dr Randol

## 2023-10-01 DIAGNOSIS — E785 Hyperlipidemia, unspecified: Secondary | ICD-10-CM | POA: Diagnosis not present

## 2023-10-01 DIAGNOSIS — I119 Hypertensive heart disease without heart failure: Secondary | ICD-10-CM | POA: Diagnosis not present

## 2023-10-01 DIAGNOSIS — F909 Attention-deficit hyperactivity disorder, unspecified type: Secondary | ICD-10-CM | POA: Diagnosis not present

## 2023-10-01 DIAGNOSIS — R7303 Prediabetes: Secondary | ICD-10-CM | POA: Diagnosis not present

## 2023-10-01 DIAGNOSIS — I69398 Other sequelae of cerebral infarction: Secondary | ICD-10-CM | POA: Diagnosis not present

## 2023-10-01 DIAGNOSIS — D649 Anemia, unspecified: Secondary | ICD-10-CM | POA: Diagnosis not present

## 2023-10-01 DIAGNOSIS — M519 Unspecified thoracic, thoracolumbar and lumbosacral intervertebral disc disorder: Secondary | ICD-10-CM | POA: Diagnosis not present

## 2023-10-01 DIAGNOSIS — F419 Anxiety disorder, unspecified: Secondary | ICD-10-CM | POA: Diagnosis not present

## 2023-10-01 DIAGNOSIS — H53462 Homonymous bilateral field defects, left side: Secondary | ICD-10-CM | POA: Diagnosis not present

## 2023-10-01 NOTE — Telephone Encounter (Unsigned)
 Copied from CRM [id number]. Topic: Clinical - Home Health Verbal Orders >> Oct 01, 2023 12:06 PM Antwanette L wrote: Caller/Agency: [facility] Callback Number: [phone]  Service Requested: Physical Therapy and speech therapy Frequency: Physical Therapy (1x for one week and 2x for 2 weeks and 1x for 6 weeks ). Hadassah is requesting an evaluation for speech therapy  Any new concerns about the patient? No

## 2023-10-04 ENCOUNTER — Ambulatory Visit: Admitting: Medical

## 2023-10-04 VITALS — BP 120/60 | HR 70 | Wt 186.8 lb

## 2023-10-04 DIAGNOSIS — Z8673 Personal history of transient ischemic attack (TIA), and cerebral infarction without residual deficits: Secondary | ICD-10-CM | POA: Diagnosis not present

## 2023-10-04 DIAGNOSIS — K921 Melena: Secondary | ICD-10-CM

## 2023-10-04 DIAGNOSIS — Z9289 Personal history of other medical treatment: Secondary | ICD-10-CM

## 2023-10-04 DIAGNOSIS — R7309 Other abnormal glucose: Secondary | ICD-10-CM

## 2023-10-04 DIAGNOSIS — F909 Attention-deficit hyperactivity disorder, unspecified type: Secondary | ICD-10-CM | POA: Diagnosis not present

## 2023-10-04 DIAGNOSIS — E785 Hyperlipidemia, unspecified: Secondary | ICD-10-CM

## 2023-10-04 DIAGNOSIS — D649 Anemia, unspecified: Secondary | ICD-10-CM

## 2023-10-04 DIAGNOSIS — I10 Essential (primary) hypertension: Secondary | ICD-10-CM | POA: Diagnosis not present

## 2023-10-04 DIAGNOSIS — H547 Unspecified visual loss: Secondary | ICD-10-CM | POA: Diagnosis not present

## 2023-10-04 MED ORDER — MAGNESIUM GLUCONATE 500 MG PO TABS: 500.0000 mg | ORAL_TABLET | Freq: Every day | ORAL | 1 refills | Status: AC

## 2023-10-04 MED ORDER — ATORVASTATIN CALCIUM 40 MG PO TABS: 40.0000 mg | ORAL_TABLET | Freq: Every day | ORAL | 1 refills | Status: DC

## 2023-10-04 MED ORDER — AMLODIPINE BESYLATE 5 MG PO TABS
5.0000 mg | ORAL_TABLET | Freq: Every day | ORAL | 1 refills | Status: AC
Start: 2023-10-04 — End: ?

## 2023-10-04 NOTE — Progress Notes (Signed)
 Subjective:  Terry Hays is a 73 y.o. male who presents for Chief Complaint  Patient presents with   Hospitalization Follow-up    Hospital follow-up from stroke, can't see 70% out of left eye, and 20-30% out of right eye. Unable to see much so can't drive right now. Just got out of Rehab on 7/24. Needs speech and OT. PT therapy with [facility]     Here for hospital follow-up.  Here with his daughter and wife today.  They have a whole list of questions post hospitalization.  Admit date 09/08/2023 through 09/21/2023.  Discharge Diagnoses:  Principal Problem:   Acute cerebrovascular accident (CVA) due to occlusion of right posterior cerebral artery (HCC) Active Problems:   Normocytic anemia   Right lower lobe pulmonary nodule   Attention deficit hyperactivity disorder (ADHD), predominantly hyperactive type   Cognitive impairment   Dyslipidemia   Hypertension  Medications discontinued at hospital discharge alprazolam , Vyvanse 10 mg and 60 mg  He went into the hospital with decline in attention and memory changes.  In the ED with problems with delayed recall, attention, neurological findings on exam and Mini-Mental status exam  His wife and daughter tells that he probably had a stroke around June 25.  Around the same time he was having change in symptoms with focus and attention, he was also having black stool for several days.  He apparently did a televisit at some point around June 25 and was put on Cipro  which seemed to clear the black stool up.  However he was found to be anemic upon hospitalization  He has not had any more black stool or blood in the stool since the hospitalization  He has questions about when he can play pickle ball and golf again  He lost 70% vision in left eye and 20-30% vision in right eye.   Left quadrant both eyes.   Hospitalist recommended neuro OT involved in treatment plan as outpatient.  He would like to regain vision and ability to drive again.  There  was miscommunications between hospital discharge and nursing rehab regarding neuro OT orders.    Family has questions about lining up home health therapy and outpatient therapy as he is progressing and doing well so far.  He needs referrals in place for speech therapy, Occupational Therapy and physical therapy.  They also are looking into getting him a specific neurological Occupational Therapy.  He had at home PT assessment last week.   They need PCP approval.   Awaiting OT and speech assessments.  [facility] has already come out to the house.  Regarding outpatient therapy, they would like to have [facility] as location for therapy as they have different therapies in house.  The family has already looked at a couple names of potential neurological Occupational Therapy clinics at [facility].  Those include-Dr. Rankin Oats, Dr. Marcey Lee, Dr. Damien Monarch, Dr. Donna Eden, [facility]  Referral at [facility], [phone]  He sees Dr. Robinson for ophthalmology  They want to go ahead and get him set up for sleep study.  This was recommended by the hospital to help look for possibly underlying silent A-fib.  She needs referral to cardiology.  He was seeing Dr. Alveta who recently retired.  He saw Dr. Ozell Passey and a Dr. Fonda Kitty in the hospital.    He would like referral back to his GI doctor Dr. Rosalie.  They were curious if he needed a Geriontologist, Dr. Charlanne  They also asked him to  go on an SSRI to help with mood.  With all these changes related to his recent stroke they are concerned about him getting down in his mood.  No other aggravating or relieving factors.    No other c/o.  Past Medical History:  Diagnosis Date   Abnormal ECG    Allergy    Anxiety    FLYING   Diverticulosis    Dyslipidemia    History of prostate cancer    Hyperlipidemia    Hypertension    LVH (left ventricular hypertrophy)    Pre-diabetes    Current Outpatient Medications on File Prior to  Visit  Medication Sig Dispense Refill   aspirin  EC 81 MG tablet Take 1 tablet (81 mg total) by mouth daily. 90 tablet 3   clopidogrel  (PLAVIX ) 75 MG tablet Take 1 tablet (75 mg total) by mouth daily for 20 days. 20 tablet 0   ferrous sulfate  325 (65 FE) MG tablet Take 1 tablet (325 mg total) by mouth daily with breakfast. 30 tablet 3   losartan  (COZAAR ) 50 MG tablet TAKE 1 TABLET BY MOUTH DAILY 90 tablet 3   pantoprazole  (PROTONIX ) 40 MG tablet Take 1 tablet (40 mg total) by mouth 2 (two) times daily. 60 tablet 0   potassium chloride  (KLOR-CON ) 10 MEQ tablet TAKE 1 TABLET BY MOUTH ONCE DAILY 90 tablet 3   senna-docusate (SENOKOT-S) 8.6-50 MG tablet Take 1 tablet by mouth 2 (two) times daily. 60 tablet 0   TURMERIC PO Take 1,000 mg by mouth daily.     No current facility-administered medications on file prior to visit.    The following portions of the patient's history were reviewed and updated as appropriate: allergies, current medications, past family history, past medical history, past social history, past surgical history and problem list.  ROS Otherwise as in subjective above    Objective: BP 120/60   Pulse 70   Wt 186 lb 12.8 oz (84.7 kg)   SpO2 95%   BMI 28.40 kg/m   BP Readings from Last 3 Encounters:  10/04/23 120/60  09/21/23 (!) 165/67  09/02/22 112/62    General appearance: alert, no distress, well developed, well nourished Psych: Pleasant, answers questions appropriately Heart: Regular rate rhythm, normal S1-S2, no murmur Lungs clear No obvious extremity edema    Assessment: Encounter Diagnoses  Name Primary?   Vision loss Yes   History of stroke    History of recent hospitalization    Primary hypertension    Elevated glucose    Dyslipidemia    Anemia, unspecified type    Attention deficit hyperactivity disorder (ADHD), unspecified ADHD type    Black stool      Plan: I reviewed his hospital discharge summary, imaging, labs, and  recommendations  I answered their many questions today post hospitalization  His main concern is vision loss and trying to reclaim his vision, and getting back to golf and pickleball.   I advised against pickletball and golf for the time being to recuperate.    Referrals today for physical therapy, speech therapy and Occupational Therapy.  The family feels like he is progressing pretty well already so they want to go ahead and line up both the home health therapy as well as outpatient therapy.  He would like to do outpatient therapy at the [facility] location  [facility] has already been out to the house to initiate physical therapy.  We will call them to approve evals for speech therapy and  Occupational Therapy  We will inquire into neurological and Occupational Therapy specialist through either his ophthalmology office here Dr. Robinson or [facility]  Discontinue potassium.  He was initially put on this when he was on diuretic but that was 5 months ago.  His recent potassium levels are normal but he likely does not need to be on potassium at this time.  I did recommend recheck potassium level in a couple weeks  Given the history of stroke and concern for potential underlying arrhythmia, we will also pursue sleep apnea test.  Referral back to cardiology.  He was seeing Dr. Alveta before but Dr. Calhoun is retiring.  So we will help to get him in with another provider there, possible Dr. Kennyth or Dr. Lesia he saw in the hospital  Referral to neurology, Dr. [name] for post stroke follow-up  Given recent black stool and anemia, referral back to Dr. Rosalie GI.  He has not had any black stools since the hospitalization.  We will recheck his CBC today.  Continue iron with time being.  Continue Protonix  40 mg twice daily.  If he sees any more black stool or blood in the stool, he will begin Pepto-Bismol twice a day let our office know right away in case we need to expedite GI consult  or do more urgent lab work  Impaired glucose-updated labs today.  Monitor blood pressure at home once or twice daily and write these numbers down.  Regarding mood concern, follow-up with your psychiatrist Dr. Cherrie soon  Right pulmonary lobe nodule noted on scan in the hospital-follow-up on this at next visit    Medications discontinued at discharge from hospital: Vyvanse and alprazolam .   Medications continued at discharge as below:  Recent hospitalization for stroke. Continue clopidogrel  Plavix  75 mg daily for 20 days Continue medications below for blood pressure and cholesterol  Hypertension: Amlodipine  5 mg daily Losartan  50 mg daily  Hyperlipidemia  Aspirin  81 mg daily Atorvastatin  40 mg daily  Recent black stool-continue pantoprazole  Protonix  40 mg twice a day for the time being until you see gastroenterology  History of iron deficiency Continue ferrous sulfate  iron 325 mg daily  ADHD - vyvanse held in hospital and at discharge.  Sees Dr. Sharlet Cherrie  Can continue Senokot for stool softener  Can continue turmeric for anti-inflammation  Can continue magnesium  daily     Kao Conry was seen today for hospitalization follow-up.  Diagnoses and all orders for this visit:  Vision loss -     Ambulatory referral to Neurology -     Ambulatory referral to Cardiology -     Ambulatory referral to Physical Therapy -     Ambulatory referral to Speech Therapy  History of stroke -     Ambulatory referral to Neurology -     Ambulatory referral to Cardiology -     Ambulatory referral to Physical Therapy -     Ambulatory referral to Speech Therapy  History of recent hospitalization -     Ambulatory referral to Gastroenterology  Primary hypertension -     amLODipine  (NORVASC ) 5 MG tablet; Take 1 tablet (5 mg total) by mouth daily. -     Comprehensive metabolic panel with GFR -     Ambulatory referral to Cardiology  Elevated glucose -      Comprehensive metabolic panel with GFR -     CBC -     Hemoglobin A1c  Dyslipidemia -     atorvastatin  (LIPITOR) 40 MG tablet;  Take 1 tablet (40 mg total) by mouth daily.  Anemia, unspecified type -     CBC  Attention deficit hyperactivity disorder (ADHD), unspecified ADHD type -     Vitamin B12  Black stool -     Ambulatory referral to Gastroenterology  Other orders -     magnesium  gluconate (MAGONATE) 500 MG tablet; Take 1 tablet (500 mg total) by mouth daily.   Spent > 45 minutes face to face with patient in discussion of symptoms, evaluation, plan and recommendations.     Follow up: pending labs, referrals

## 2023-10-04 NOTE — Telephone Encounter (Signed)
 [facility] verbal orders to [facility]   Copied from [employer] [id number]. Topic: Referral - Status >> Oct 04, 2023  8:35 AM Herma G wrote: Reason for CRM: Mrs. Hadassah, physical therapist from Home Health called to follow up on verbal orders requested from PCP on 7/24. I could not see any info regarding this matter so I told her I would send a message to clinic to see if she could a status. Please call her back at [phone] if needed

## 2023-10-05 ENCOUNTER — Ambulatory Visit: Payer: Self-pay | Admitting: Medical

## 2023-10-05 DIAGNOSIS — F909 Attention-deficit hyperactivity disorder, unspecified type: Secondary | ICD-10-CM | POA: Diagnosis not present

## 2023-10-05 DIAGNOSIS — H53462 Homonymous bilateral field defects, left side: Secondary | ICD-10-CM | POA: Diagnosis not present

## 2023-10-05 DIAGNOSIS — E785 Hyperlipidemia, unspecified: Secondary | ICD-10-CM | POA: Diagnosis not present

## 2023-10-05 DIAGNOSIS — F419 Anxiety disorder, unspecified: Secondary | ICD-10-CM | POA: Diagnosis not present

## 2023-10-05 DIAGNOSIS — R7303 Prediabetes: Secondary | ICD-10-CM | POA: Diagnosis not present

## 2023-10-05 DIAGNOSIS — D649 Anemia, unspecified: Secondary | ICD-10-CM | POA: Diagnosis not present

## 2023-10-05 DIAGNOSIS — M519 Unspecified thoracic, thoracolumbar and lumbosacral intervertebral disc disorder: Secondary | ICD-10-CM | POA: Diagnosis not present

## 2023-10-05 DIAGNOSIS — I119 Hypertensive heart disease without heart failure: Secondary | ICD-10-CM | POA: Diagnosis not present

## 2023-10-05 DIAGNOSIS — I69398 Other sequelae of cerebral infarction: Secondary | ICD-10-CM | POA: Diagnosis not present

## 2023-10-05 LAB — COMPREHENSIVE METABOLIC PANEL WITH GFR
ALT: 27 IU/L (ref 0–44)
AST: 16 IU/L (ref 0–40)
Albumin: 4.3 g/dL (ref 3.8–4.8)
Alkaline Phosphatase: 92 IU/L (ref 44–121)
BUN/Creatinine Ratio: 15 (ref 10–24)
BUN: 13 mg/dL (ref 8–27)
Bilirubin Total: 0.3 mg/dL (ref 0.0–1.2)
CO2: 20 mmol/L (ref 20–29)
Calcium: 9.5 mg/dL (ref 8.6–10.2)
Chloride: 105 mmol/L (ref 96–106)
Creatinine, Ser: 0.89 mg/dL (ref 0.76–1.27)
Globulin, Total: 2.1 g/dL (ref 1.5–4.5)
Glucose: 158 mg/dL — ABNORMAL HIGH (ref 70–99)
Potassium: 4.1 mmol/L (ref 3.5–5.2)
Sodium: 140 mmol/L (ref 134–144)
Total Protein: 6.4 g/dL (ref 6.0–8.5)
eGFR: 89 mL/min/1.73 (ref >59)

## 2023-10-05 LAB — CBC
Hematocrit: 41.4 % (ref 37.5–51.0)
Hemoglobin: 12.9 g/dL — ABNORMAL LOW (ref 13.0–17.7)
MCH: 29.1 pg (ref 26.6–33.0)
MCHC: 31.2 g/dL — ABNORMAL LOW (ref 31.5–35.7)
MCV: 94 fL (ref 79–97)
Platelets: 256 x10E3/uL (ref 150–450)
RBC: 4.43 x10E6/uL (ref 4.14–5.80)
RDW: 13.3 % (ref 11.6–15.4)
WBC: 6 x10E3/uL (ref 3.4–10.8)

## 2023-10-05 LAB — HEMOGLOBIN A1C
Est. average glucose Bld gHb Est-mCnc: 108 mg/dL
Hgb A1c MFr Bld: 5.4 % (ref 4.8–5.6)

## 2023-10-05 LAB — VITAMIN B12: Vitamin B-12: 464 pg/mL (ref 232–1245)

## 2023-10-05 NOTE — Progress Notes (Signed)
 If I didn't send this to you yesterday, call Dr. Shawnie office and see if Dr. Robinson has a recommendation for neuro occupational therapy for a recent stroke patient with recent partial vision loss.  Results thru my chart

## 2023-10-05 NOTE — Progress Notes (Signed)
 I have just placed this referral and requested it to be to [facility] with any of the doctors I listed

## 2023-10-05 NOTE — Progress Notes (Signed)
 1- I have faxed over sleep study to [facility]  [facility] where Dr. Robinson is said they refer to [facility] for specific neurologic occupational therapist as they do not handle that portion  3- spoke to [facility] with [facility] and approved patient for PT/ OT/ speech  4- looks like everything was placed right for referrals, so its referral team for the specialists to schedule, sometimes it can take a couple weeks

## 2023-10-05 NOTE — Addendum Note (Signed)
 Addended by: VICCI HUSBAND A on: 10/05/2023 10:22 AM   Modules accepted: Orders

## 2023-10-06 DIAGNOSIS — D649 Anemia, unspecified: Secondary | ICD-10-CM | POA: Diagnosis not present

## 2023-10-06 DIAGNOSIS — H53462 Homonymous bilateral field defects, left side: Secondary | ICD-10-CM | POA: Diagnosis not present

## 2023-10-06 DIAGNOSIS — F909 Attention-deficit hyperactivity disorder, unspecified type: Secondary | ICD-10-CM | POA: Diagnosis not present

## 2023-10-06 DIAGNOSIS — F419 Anxiety disorder, unspecified: Secondary | ICD-10-CM | POA: Diagnosis not present

## 2023-10-06 DIAGNOSIS — E785 Hyperlipidemia, unspecified: Secondary | ICD-10-CM | POA: Diagnosis not present

## 2023-10-06 DIAGNOSIS — R7303 Prediabetes: Secondary | ICD-10-CM | POA: Diagnosis not present

## 2023-10-06 DIAGNOSIS — I119 Hypertensive heart disease without heart failure: Secondary | ICD-10-CM | POA: Diagnosis not present

## 2023-10-06 DIAGNOSIS — M519 Unspecified thoracic, thoracolumbar and lumbosacral intervertebral disc disorder: Secondary | ICD-10-CM | POA: Diagnosis not present

## 2023-10-06 DIAGNOSIS — I69398 Other sequelae of cerebral infarction: Secondary | ICD-10-CM | POA: Diagnosis not present

## 2023-10-07 ENCOUNTER — Telehealth: Payer: Self-pay

## 2023-10-07 ENCOUNTER — Inpatient Hospital Stay: Admitting: Family Medicine

## 2023-10-07 DIAGNOSIS — F419 Anxiety disorder, unspecified: Secondary | ICD-10-CM | POA: Diagnosis not present

## 2023-10-07 DIAGNOSIS — I119 Hypertensive heart disease without heart failure: Secondary | ICD-10-CM | POA: Diagnosis not present

## 2023-10-07 DIAGNOSIS — R7303 Prediabetes: Secondary | ICD-10-CM | POA: Diagnosis not present

## 2023-10-07 DIAGNOSIS — I69398 Other sequelae of cerebral infarction: Secondary | ICD-10-CM | POA: Diagnosis not present

## 2023-10-07 DIAGNOSIS — E785 Hyperlipidemia, unspecified: Secondary | ICD-10-CM | POA: Diagnosis not present

## 2023-10-07 DIAGNOSIS — F909 Attention-deficit hyperactivity disorder, unspecified type: Secondary | ICD-10-CM | POA: Diagnosis not present

## 2023-10-07 DIAGNOSIS — D649 Anemia, unspecified: Secondary | ICD-10-CM | POA: Diagnosis not present

## 2023-10-07 DIAGNOSIS — H53462 Homonymous bilateral field defects, left side: Secondary | ICD-10-CM | POA: Diagnosis not present

## 2023-10-07 DIAGNOSIS — M519 Unspecified thoracic, thoracolumbar and lumbosacral intervertebral disc disorder: Secondary | ICD-10-CM | POA: Diagnosis not present

## 2023-10-07 NOTE — Telephone Encounter (Signed)
 Copied from CRM [id number]. Topic: Clinical - Home Health Verbal Orders >> Oct 06, 2023  1:35 PM Graeme ORN wrote: Caller/Agency: [facility] Callback Number: [phone] [facility] to leave message  Service Requested: Speech Therapy Frequency: 1 time a week for 8 weeks  Any new concerns about the patient? No   Dr.Lalonde, Okay to give verbal orders for speech therapy?

## 2023-10-08 DIAGNOSIS — H53462 Homonymous bilateral field defects, left side: Secondary | ICD-10-CM | POA: Diagnosis not present

## 2023-10-08 DIAGNOSIS — D649 Anemia, unspecified: Secondary | ICD-10-CM | POA: Diagnosis not present

## 2023-10-08 DIAGNOSIS — F419 Anxiety disorder, unspecified: Secondary | ICD-10-CM | POA: Diagnosis not present

## 2023-10-08 DIAGNOSIS — I119 Hypertensive heart disease without heart failure: Secondary | ICD-10-CM | POA: Diagnosis not present

## 2023-10-08 DIAGNOSIS — F909 Attention-deficit hyperactivity disorder, unspecified type: Secondary | ICD-10-CM | POA: Diagnosis not present

## 2023-10-08 DIAGNOSIS — I69398 Other sequelae of cerebral infarction: Secondary | ICD-10-CM | POA: Diagnosis not present

## 2023-10-08 DIAGNOSIS — E785 Hyperlipidemia, unspecified: Secondary | ICD-10-CM | POA: Diagnosis not present

## 2023-10-08 DIAGNOSIS — R7303 Prediabetes: Secondary | ICD-10-CM | POA: Diagnosis not present

## 2023-10-08 DIAGNOSIS — M519 Unspecified thoracic, thoracolumbar and lumbosacral intervertebral disc disorder: Secondary | ICD-10-CM | POA: Diagnosis not present

## 2023-10-08 NOTE — Telephone Encounter (Signed)
 Copied from CRM [id number]. Topic: Clinical - Home Health Verbal Orders >> Oct 08, 2023  2:41 PM Graeme ORN wrote: Caller/Agency: Signe with [facility]  Callback Number: [phone] ok to leave vm  Service Requested: Occupational Therapy Frequency: 1 week 5  Any new concerns about the patient? No

## 2023-10-08 NOTE — Telephone Encounter (Signed)
 Called [facility] and gave verbal orders for Speech therapy ok per [facility] and gave verbal orders for Occupational therapy ok per Ludie Gent

## 2023-10-11 DIAGNOSIS — I119 Hypertensive heart disease without heart failure: Secondary | ICD-10-CM | POA: Diagnosis not present

## 2023-10-11 DIAGNOSIS — M519 Unspecified thoracic, thoracolumbar and lumbosacral intervertebral disc disorder: Secondary | ICD-10-CM | POA: Diagnosis not present

## 2023-10-11 DIAGNOSIS — R7303 Prediabetes: Secondary | ICD-10-CM | POA: Diagnosis not present

## 2023-10-11 DIAGNOSIS — D649 Anemia, unspecified: Secondary | ICD-10-CM | POA: Diagnosis not present

## 2023-10-11 DIAGNOSIS — F909 Attention-deficit hyperactivity disorder, unspecified type: Secondary | ICD-10-CM | POA: Diagnosis not present

## 2023-10-11 DIAGNOSIS — I69398 Other sequelae of cerebral infarction: Secondary | ICD-10-CM | POA: Diagnosis not present

## 2023-10-11 DIAGNOSIS — H53462 Homonymous bilateral field defects, left side: Secondary | ICD-10-CM | POA: Diagnosis not present

## 2023-10-11 DIAGNOSIS — F419 Anxiety disorder, unspecified: Secondary | ICD-10-CM | POA: Diagnosis not present

## 2023-10-11 DIAGNOSIS — E785 Hyperlipidemia, unspecified: Secondary | ICD-10-CM | POA: Diagnosis not present

## 2023-10-13 DIAGNOSIS — I119 Hypertensive heart disease without heart failure: Secondary | ICD-10-CM | POA: Diagnosis not present

## 2023-10-13 DIAGNOSIS — F419 Anxiety disorder, unspecified: Secondary | ICD-10-CM | POA: Diagnosis not present

## 2023-10-13 DIAGNOSIS — M519 Unspecified thoracic, thoracolumbar and lumbosacral intervertebral disc disorder: Secondary | ICD-10-CM | POA: Diagnosis not present

## 2023-10-13 DIAGNOSIS — D649 Anemia, unspecified: Secondary | ICD-10-CM | POA: Diagnosis not present

## 2023-10-13 DIAGNOSIS — E785 Hyperlipidemia, unspecified: Secondary | ICD-10-CM | POA: Diagnosis not present

## 2023-10-13 DIAGNOSIS — I69398 Other sequelae of cerebral infarction: Secondary | ICD-10-CM | POA: Diagnosis not present

## 2023-10-13 DIAGNOSIS — F909 Attention-deficit hyperactivity disorder, unspecified type: Secondary | ICD-10-CM | POA: Diagnosis not present

## 2023-10-13 DIAGNOSIS — H53462 Homonymous bilateral field defects, left side: Secondary | ICD-10-CM | POA: Diagnosis not present

## 2023-10-13 DIAGNOSIS — R7303 Prediabetes: Secondary | ICD-10-CM | POA: Diagnosis not present

## 2023-10-14 DIAGNOSIS — D649 Anemia, unspecified: Secondary | ICD-10-CM | POA: Diagnosis not present

## 2023-10-14 DIAGNOSIS — E785 Hyperlipidemia, unspecified: Secondary | ICD-10-CM | POA: Diagnosis not present

## 2023-10-14 DIAGNOSIS — I69398 Other sequelae of cerebral infarction: Secondary | ICD-10-CM | POA: Diagnosis not present

## 2023-10-14 DIAGNOSIS — I119 Hypertensive heart disease without heart failure: Secondary | ICD-10-CM | POA: Diagnosis not present

## 2023-10-14 DIAGNOSIS — R7303 Prediabetes: Secondary | ICD-10-CM | POA: Diagnosis not present

## 2023-10-14 DIAGNOSIS — F419 Anxiety disorder, unspecified: Secondary | ICD-10-CM | POA: Diagnosis not present

## 2023-10-14 DIAGNOSIS — H53462 Homonymous bilateral field defects, left side: Secondary | ICD-10-CM | POA: Diagnosis not present

## 2023-10-14 DIAGNOSIS — M519 Unspecified thoracic, thoracolumbar and lumbosacral intervertebral disc disorder: Secondary | ICD-10-CM | POA: Diagnosis not present

## 2023-10-14 DIAGNOSIS — F909 Attention-deficit hyperactivity disorder, unspecified type: Secondary | ICD-10-CM | POA: Diagnosis not present

## 2023-10-15 DIAGNOSIS — H53462 Homonymous bilateral field defects, left side: Secondary | ICD-10-CM | POA: Diagnosis not present

## 2023-10-15 DIAGNOSIS — F909 Attention-deficit hyperactivity disorder, unspecified type: Secondary | ICD-10-CM | POA: Diagnosis not present

## 2023-10-15 DIAGNOSIS — I69398 Other sequelae of cerebral infarction: Secondary | ICD-10-CM | POA: Diagnosis not present

## 2023-10-15 DIAGNOSIS — D649 Anemia, unspecified: Secondary | ICD-10-CM | POA: Diagnosis not present

## 2023-10-15 DIAGNOSIS — I119 Hypertensive heart disease without heart failure: Secondary | ICD-10-CM | POA: Diagnosis not present

## 2023-10-15 DIAGNOSIS — F419 Anxiety disorder, unspecified: Secondary | ICD-10-CM | POA: Diagnosis not present

## 2023-10-15 DIAGNOSIS — E785 Hyperlipidemia, unspecified: Secondary | ICD-10-CM | POA: Diagnosis not present

## 2023-10-15 DIAGNOSIS — M519 Unspecified thoracic, thoracolumbar and lumbosacral intervertebral disc disorder: Secondary | ICD-10-CM | POA: Diagnosis not present

## 2023-10-15 DIAGNOSIS — R7303 Prediabetes: Secondary | ICD-10-CM | POA: Diagnosis not present

## 2023-10-18 DIAGNOSIS — F419 Anxiety disorder, unspecified: Secondary | ICD-10-CM | POA: Diagnosis not present

## 2023-10-18 DIAGNOSIS — I119 Hypertensive heart disease without heart failure: Secondary | ICD-10-CM | POA: Diagnosis not present

## 2023-10-18 DIAGNOSIS — E785 Hyperlipidemia, unspecified: Secondary | ICD-10-CM | POA: Diagnosis not present

## 2023-10-18 DIAGNOSIS — F909 Attention-deficit hyperactivity disorder, unspecified type: Secondary | ICD-10-CM | POA: Diagnosis not present

## 2023-10-18 DIAGNOSIS — I69398 Other sequelae of cerebral infarction: Secondary | ICD-10-CM | POA: Diagnosis not present

## 2023-10-18 DIAGNOSIS — H53462 Homonymous bilateral field defects, left side: Secondary | ICD-10-CM | POA: Diagnosis not present

## 2023-10-18 DIAGNOSIS — R7303 Prediabetes: Secondary | ICD-10-CM | POA: Diagnosis not present

## 2023-10-18 DIAGNOSIS — D649 Anemia, unspecified: Secondary | ICD-10-CM | POA: Diagnosis not present

## 2023-10-18 DIAGNOSIS — M519 Unspecified thoracic, thoracolumbar and lumbosacral intervertebral disc disorder: Secondary | ICD-10-CM | POA: Diagnosis not present

## 2023-10-19 ENCOUNTER — Telehealth: Payer: Self-pay | Admitting: Family Medicine

## 2023-10-19 ENCOUNTER — Ambulatory Visit (INDEPENDENT_AMBULATORY_CARE_PROVIDER_SITE_OTHER)

## 2023-10-19 DIAGNOSIS — I639 Cerebral infarction, unspecified: Secondary | ICD-10-CM | POA: Diagnosis not present

## 2023-10-19 DIAGNOSIS — Z9289 Personal history of other medical treatment: Secondary | ICD-10-CM

## 2023-10-19 DIAGNOSIS — Z8673 Personal history of transient ischemic attack (TIA), and cerebral infarction without residual deficits: Secondary | ICD-10-CM

## 2023-10-19 DIAGNOSIS — I10 Essential (primary) hypertension: Secondary | ICD-10-CM

## 2023-10-19 DIAGNOSIS — H547 Unspecified visual loss: Secondary | ICD-10-CM

## 2023-10-19 NOTE — Telephone Encounter (Signed)
 Please send referral from 09/2023 to [facility]

## 2023-10-19 NOTE — Telephone Encounter (Unsigned)
 Copied from CRM [id number]. Topic: Referral - Status >> Oct 19, 2023 12:33 PM Graeme ORN wrote: Reason for CRM: Geni called with [facility] about referral received. States they are going to send referral to Retina specialist. They are not a retina specialist and that is what patient needs based n referral. Will Fax to [facility]. Thank You

## 2023-10-19 NOTE — Telephone Encounter (Signed)
 Copied from CRM [id number]. Topic: Referral - Request for Referral >> Oct 19, 2023  2:33 PM Deaijah H wrote: Did the patient discuss referral with their provider in the last year? Yes (If No - schedule appointment) (If Yes - send message)  Appointment offered? No  Type of order/referral and detailed reason for visit: Outpatient neuro rehab : due to a stroke. Occupational/Physical & speech and language therapy  Preference of office, provider, location: [facility] ; [address] [place], [place] [address] Phone: [phone] ; Provider: Slater Christians  If referral order, have you been seen by this specialty before? Yes ; PT/OT & speech therapy started in hospital 09/08/23 discharged 09/21/23 & [facility] 09/21/23 discharged 09/30/23; at home PT/OT/Speech through [facility] started 10/04/23 (If Yes, this issue or another issue? When? Where?  Can we respond through MyChart? Yes  (Would like for information regarding referral and scheduling to go through her [phone] (daughter Jaxx Huish))

## 2023-10-20 ENCOUNTER — Ambulatory Visit: Payer: Self-pay | Admitting: Cardiology

## 2023-10-20 DIAGNOSIS — H53462 Homonymous bilateral field defects, left side: Secondary | ICD-10-CM | POA: Diagnosis not present

## 2023-10-20 DIAGNOSIS — M519 Unspecified thoracic, thoracolumbar and lumbosacral intervertebral disc disorder: Secondary | ICD-10-CM | POA: Diagnosis not present

## 2023-10-20 DIAGNOSIS — D649 Anemia, unspecified: Secondary | ICD-10-CM | POA: Diagnosis not present

## 2023-10-20 DIAGNOSIS — F419 Anxiety disorder, unspecified: Secondary | ICD-10-CM | POA: Diagnosis not present

## 2023-10-20 DIAGNOSIS — E785 Hyperlipidemia, unspecified: Secondary | ICD-10-CM | POA: Diagnosis not present

## 2023-10-20 DIAGNOSIS — I69398 Other sequelae of cerebral infarction: Secondary | ICD-10-CM | POA: Diagnosis not present

## 2023-10-20 DIAGNOSIS — R7303 Prediabetes: Secondary | ICD-10-CM | POA: Diagnosis not present

## 2023-10-20 DIAGNOSIS — I119 Hypertensive heart disease without heart failure: Secondary | ICD-10-CM | POA: Diagnosis not present

## 2023-10-20 DIAGNOSIS — F909 Attention-deficit hyperactivity disorder, unspecified type: Secondary | ICD-10-CM | POA: Diagnosis not present

## 2023-10-20 LAB — CUP PACEART REMOTE DEVICE CHECK
Date Time Interrogation Session: 20250812060406
Implantable Pulse Generator Implant Date: 20250711
Pulse Gen Model: 5000
Pulse Gen Serial Number: 511069369

## 2023-10-20 NOTE — Addendum Note (Signed)
 Addended by: VICCI HUSBAND A on: 10/20/2023 04:27 PM   Modules accepted: Orders

## 2023-10-20 NOTE — Telephone Encounter (Signed)
 Placed referral. Patient last saw Ludie for this and shane approved him for therapy

## 2023-10-25 DIAGNOSIS — R7303 Prediabetes: Secondary | ICD-10-CM | POA: Diagnosis not present

## 2023-10-25 DIAGNOSIS — D649 Anemia, unspecified: Secondary | ICD-10-CM | POA: Diagnosis not present

## 2023-10-25 DIAGNOSIS — F419 Anxiety disorder, unspecified: Secondary | ICD-10-CM | POA: Diagnosis not present

## 2023-10-25 DIAGNOSIS — E785 Hyperlipidemia, unspecified: Secondary | ICD-10-CM | POA: Diagnosis not present

## 2023-10-25 DIAGNOSIS — H53462 Homonymous bilateral field defects, left side: Secondary | ICD-10-CM | POA: Diagnosis not present

## 2023-10-25 DIAGNOSIS — I69398 Other sequelae of cerebral infarction: Secondary | ICD-10-CM | POA: Diagnosis not present

## 2023-10-25 DIAGNOSIS — I119 Hypertensive heart disease without heart failure: Secondary | ICD-10-CM | POA: Diagnosis not present

## 2023-10-25 DIAGNOSIS — M519 Unspecified thoracic, thoracolumbar and lumbosacral intervertebral disc disorder: Secondary | ICD-10-CM | POA: Diagnosis not present

## 2023-10-25 DIAGNOSIS — F909 Attention-deficit hyperactivity disorder, unspecified type: Secondary | ICD-10-CM | POA: Diagnosis not present

## 2023-10-26 ENCOUNTER — Telehealth: Payer: Self-pay

## 2023-10-26 ENCOUNTER — Ambulatory Visit (INDEPENDENT_AMBULATORY_CARE_PROVIDER_SITE_OTHER)

## 2023-10-26 DIAGNOSIS — Z0000 Encounter for general adult medical examination without abnormal findings: Secondary | ICD-10-CM

## 2023-10-26 NOTE — Telephone Encounter (Signed)
 Copied from CRM #[id number]. Topic: General - Other >> Oct 26, 2023 11:45 AM Marissa P wrote: Reason for CRM: Please if the invite for this patients appt can be sent to [email or web address] instead please >> Oct 26, 2023 12:27 PM CMA Jon DEL wrote: Email was updated in appt notes. I also let Jiles know as well.

## 2023-10-26 NOTE — Patient Instructions (Signed)
 Mr. Jovel , Thank you for taking time out of your busy schedule to complete your Annual Wellness Visit with me. I enjoyed our conversation and look forward to speaking with you again next year. I, as well as your care team,  appreciate your ongoing commitment to your health goals. Please review the following plan we discussed and let me know if I can assist you in the future. Your Game plan/ To Do List    Referrals: If you haven't heard from the office you've been referred to, please reach out to them at the phone provided.   Follow up Visits: We will see or speak with you next year for your Next Medicare AWV with our clinical staff Have you seen your provider in the last 6 months (3 months if uncontrolled diabetes)? Yes  Clinician Recommendations:  Aim for 30 minutes of exercise or brisk walking, 6-8 glasses of water, and 5 servings of fruits and vegetables each day.       This is a list of the screenings recommended for you:  Health Maintenance  Topic Date Due   Zoster (Shingles) Vaccine (1 of 2) 04/01/1967   Pneumococcal Vaccine for age over 55 (3 of 3 - PCV20 or PCV21) 12/22/2018   Cologuard (Stool DNA test)  06/19/2022   COVID-19 Vaccine (4 - 2024-25 season) 11/08/2022   Flu Shot  10/08/2023   DTaP/Tdap/Td vaccine (3 - Td or Tdap) 12/22/2023   Medicare Annual Wellness Visit  10/25/2024   Hepatitis C Screening  Completed   HPV Vaccine  Aged Out   Meningitis B Vaccine  Aged Out   Colon Cancer Screening  Discontinued    Advanced directives: (Copy Requested) Please bring a copy of your health care power of attorney and living will to the office to be added to your chart at your convenience. You can mail to [facility] [address] [place], [place] [address] or email to [email or web address] Advance Care Planning is important because it:  [x]  Makes sure you receive the medical care that is consistent with your values, goals, and preferences  [x]  It  provides guidance to your family and loved ones and reduces their decisional burden about whether or not they are making the right decisions based on your wishes.  Follow the link provided in your after visit summary or read over the paperwork we have mailed to you to help you started getting your Advance Directives in place. If you need assistance in completing these, please reach out to us  so that we can help you!  See attachments for Preventive Care and Fall Prevention Tips.

## 2023-10-26 NOTE — Progress Notes (Signed)
 Subjective:   Terry Hays is a 73 y.o. who presents for a Medicare Wellness preventive visit.  As a reminder, Annual Wellness Visits don't include a physical exam, and some assessments may be limited, especially if this visit is performed virtually. We may recommend an in-person follow-up visit with your provider if needed.  Visit Complete: Virtual I connected with  Terry Hays on 10/26/23 by a video and audio enabled telemedicine application and verified that I am speaking with the correct person using two identifiers.  Patient Location: Home  Provider Location: Office/Clinic  I discussed the limitations of evaluation and management by telemedicine. The patient expressed understanding and agreed to proceed.  Vital Signs: Because this visit was a virtual/telehealth visit, some criteria may be missing or patient reported. Any vitals not documented were not able to be obtained and vitals that have been documented are patient reported.    Persons Participating in Visit: Patient assisted by wife.  AWV Questionnaire: No: Patient Medicare AWV questionnaire was not completed prior to this visit.  Cardiac Risk Factors include: advanced age (>53men, >49 women);hypertension;dyslipidemia;male gender     Objective:    Today's Vitals   There is no height or weight on file to calculate BMI.     10/26/2023    1:30 PM 09/08/2023    6:31 PM 09/08/2023    6:30 PM 05/30/2021    8:34 AM 05/28/2020    2:15 PM 04/04/2018    9:06 AM 03/17/2018   10:11 AM  Advanced Directives  Does Patient Have a Medical Advance Directive? Yes  No Yes Yes  Yes   Type of Estate agent of [place];Living will   Healthcare Power of [place];Living will Living will;Out of facility DNR (pink MOST or yellow form) Living will Healthcare Power of [place];Living will  Does patient want to make changes to medical advance directive?     No - Patient declined No - Patient declined  No - Patient  declined   Copy of Healthcare Power of Attorney in Chart? No - copy requested   No - copy requested   No - copy requested   Would patient like information on creating a medical advance directive?  Yes (Inpatient - patient requests chaplain consult to create a medical advance directive)          Data saved with a previous flowsheet row definition    Current Medications (verified) Outpatient Encounter Medications as of 10/26/2023  Medication Sig   amLODipine  (NORVASC ) 5 MG tablet Take 1 tablet (5 mg total) by mouth daily.   aspirin  EC 81 MG tablet Take 1 tablet (81 mg total) by mouth daily.   atorvastatin  (LIPITOR) 40 MG tablet Take 1 tablet (40 mg total) by mouth daily.   cyanocobalamin (VITAMIN B12) 1000 MCG tablet Take 1,000 mcg by mouth daily.   ferrous sulfate  325 (65 FE) MG tablet Take 1 tablet (325 mg total) by mouth daily with breakfast.   losartan  (COZAAR ) 50 MG tablet TAKE 1 TABLET BY MOUTH DAILY   magnesium  gluconate (MAGONATE) 500 MG tablet Take 1 tablet (500 mg total) by mouth daily.   pantoprazole  (PROTONIX ) 40 MG tablet Take 1 tablet (40 mg total) by mouth 2 (two) times daily.   senna-docusate (SENOKOT-S) 8.6-50 MG tablet Take 1 tablet by mouth 2 (two) times daily.   potassium chloride  (KLOR-CON ) 10 MEQ tablet TAKE 1 TABLET BY MOUTH ONCE DAILY (Patient not taking: Reported on 10/26/2023)   TURMERIC PO Take 1,000 mg  by mouth daily. (Patient not taking: Reported on 10/26/2023)   No facility-administered encounter medications on file as of 10/26/2023.    Allergies (verified) Tramadol   History: Past Medical History:  Diagnosis Date   Abnormal ECG    Allergy    Anxiety    FLYING   Diverticulosis    Dyslipidemia    History of prostate cancer    Hyperlipidemia    Hypertension    LVH (left ventricular hypertrophy)    Pre-diabetes    Past Surgical History:  Procedure Laterality Date   COLONOSCOPY  2004 2011   MAGOD   EYE SURGERY  03/10/1999   CATOACTS BOTH   HERNIA  REPAIR  03/09/1952   R INGHINAL   INGUINAL HERNIA REPAIR Left 04/07/2018   Procedure: OPERN LEFT INGUINAL HERNIA REPAIR WITH MESH ERAS PATHWAY;  Surgeon: Tanda Locus, MD;  Location: [facility];  Service: General;  Laterality: Left;   LOOP RECORDER INSERTION N/A 09/17/2023   Procedure: LOOP RECORDER INSERTION;  Surgeon: Lesia Ozell Barter, PA-C;  Location: [facility] INVASIVE CV LAB;  Service: Cardiovascular;  Laterality: N/A;   PENILE PROSTHESIS IMPLANT  03/09/2009   DAHLSTEDT   PROSTATE SURGERY  03/09/2001   PROSTRATECTOMY   Family History  Problem Relation Age of Onset   Heart attack Other    Hyperlipidemia Other    Cancer Other    Social History   Socioeconomic History   Marital status: Married    Spouse name: Not on file   Number of children: Not on file   Years of education: Not on file   Highest education level: Not on file  Occupational History   Not on file  Tobacco Use   Smoking status: Former    Current packs/day: 0.00    Types: Cigars, Cigarettes    Quit date: 03/09/1976    Years since quitting: 47.6   Smokeless tobacco: Never   Tobacco comments:    Smokes 1 cigar every 7 weeks.  Vaping Use   Vaping status: Never Used  Substance and Sexual Activity   Alcohol use: Yes    Alcohol/week: 1.0 standard drink of alcohol    Types: 1 Glasses of wine per week    Comment: scotch   Drug use: No   Sexual activity: Not on file  Other Topics Concern   Not on file  Social History Narrative   Not on file   Social Drivers of Health   Financial Resource Strain: Low Risk  (10/26/2023)   Overall Financial Resource Strain (CARDIA)    Difficulty of Paying Living Expenses: Not hard at all  Food Insecurity: No Food Insecurity (10/26/2023)   Hunger Vital Sign    Worried About Running Out of Food in the Last Year: Never true    Ran Out of Food in the Last Year: Never true  Transportation Needs: No Transportation Needs (10/26/2023)   PRAPARE - Administrator, Civil Service  (Medical): No    Lack of Transportation (Non-Medical): No  Physical Activity: Sufficiently Active (10/26/2023)   Exercise Vital Sign    Days of Exercise per Week: 7 days    Minutes of Exercise per Session: 90 min  Stress: No Stress Concern Present (10/26/2023)   [employer] of Occupational Health - Occupational Stress Questionnaire    Feeling of Stress: Not at all  Social Connections: Moderately Isolated (10/26/2023)   Social Connection and Isolation Panel    Frequency of Communication with Friends and Family: More than three times a  week    Frequency of Social Gatherings with Friends and Family: Twice a week    Attends Religious Services: Never    Database administrator or Organizations: No    Attends Engineer, structural: Never    Marital Status: Married    Tobacco Counseling Counseling given: Not Answered Tobacco comments: Smokes 1 cigar every 7 weeks.    Clinical Intake:  Pre-visit preparation completed: Yes  Pain : No/denies pain     Nutritional Risks: None Diabetes: No  Lab Results  Component Value Date   HGBA1C 5.4 10/04/2023   HGBA1C 5.5 09/09/2023   HGBA1C 6.3 (H) 09/02/2022     How often do you need to have someone help you when you read instructions, pamphlets, or other written materials from your doctor or pharmacy?: 1 - Never  Interpreter Needed?: No  Information entered by :: NAllen LPN   Activities of Daily Living     10/26/2023    1:23 PM 09/08/2023    6:19 PM  In your present state of health, do you have any difficulty performing the following activities:  Hearing? 0 0  Vision? 1 1  Comment trouble with left eye out of L eye  Difficulty concentrating or making decisions? 0 1  Comment  difficulty remembering per family  Walking or climbing stairs? 0   Dressing or bathing? 0   Doing errands, shopping? 0   Preparing Food and eating ? N   Using the Toilet? N   In the past six months, have you accidently leaked urine? N   Do you  have problems with loss of bowel control? N   Managing your Medications? Y   Managing your Finances? N   Housekeeping or managing your Housekeeping? N     Patient Care Team: [name], MD as PCP - General (Family Medicine) Nahser, Aleene PARAS, MD (Inactive) as PCP - Cardiology (Cardiology)  I have updated your Care Teams any recent Medical Services you may have received from other providers in the past year.     Assessment:   This is a routine wellness examination for Derrico.  Hearing/Vision screen Hearing Screening - Comments:: Denies hearing issues Vision Screening - Comments:: Regular eye exams, Dr. Robinson   Goals Addressed             This Visit's Progress    Patient Stated       10/26/2023, see perfectly with left eye       Depression Screen     10/26/2023    1:33 PM 09/02/2022   11:06 AM 06/03/2021    8:15 AM 05/30/2021    8:35 AM 05/28/2020    2:16 PM 05/15/2019   10:11 AM 03/17/2018    9:37 AM  PHQ 2/9 Scores  PHQ - 2 Score 0 0 0 0 0 0 0    Fall Risk     10/26/2023    1:31 PM 09/02/2022   11:09 AM 05/30/2021    8:35 AM 05/27/2021    9:51 AM 05/28/2020    2:16 PM  Fall Risk   Falls in the past year? 1 0 0 0 0  Comment tumbled down a hill holding a bag      Number falls in past yr: 0 0  0 0  Injury with Fall? 0 0  0 0  Risk for fall due to : Impaired mobility;Impaired vision No Fall Risks Medication side effect  No Fall Risks  Follow up Falls prevention  discussed;Falls evaluation completed Falls evaluation completed Falls evaluation completed;Education provided;Falls prevention discussed   Falls evaluation completed      Data saved with a previous flowsheet row definition    MEDICARE RISK AT HOME:  Medicare Risk at Home Any stairs in or around the home?: Yes If so, are there any without handrails?: No Home free of loose throw rugs in walkways, pet beds, electrical cords, etc?: Yes Adequate lighting in your home to reduce risk of falls?: Yes Life alert?:  No Use of a cane, walker or w/c?: Yes Grab bars in the bathroom?: Yes Shower chair or bench in shower?: Yes Elevated toilet seat or a handicapped toilet?: No  TIMED UP AND GO:  Was the test performed?  No  Cognitive Function: 6CIT completed        10/26/2023    1:34 PM  6CIT Screen  What Year? 0 points  What month? 0 points  What time? 0 points  Count back from 20 0 points  Months in reverse 0 points  Repeat phrase 0 points  Total Score 0 points    Immunizations Immunization History  Administered Date(s) Administered    sv, Bivalent, Protein Subunit Rsvpref,pf [place]) 11/28/2022   Fluad Quad(high Dose 65+) 12/02/2018   Fluad Trivalent(High Dose 65+) 11/28/2022   Influenza, High Dose Seasonal PF 12/21/2013, 11/02/2016, 11/29/2017   Influenza, Seasonal, Injecte, Preservative Fre 03/10/2012   Influenza,inj,Quad PF,6+ Mos 12/23/2012   Influenza-Unspecified 12/17/2020   Moderna Covid-19 Vaccine Bivalent Booster 14yrs & up 12/17/2020   Moderna Sars-Covid-2 Vaccination 03/27/2019, 04/24/2019   Pneumococcal Conjugate-13 12/21/2013   Pneumococcal Polysaccharide-23 10/29/1997   Tdap 09/06/2001, 12/21/2013   Zoster, Live 03/09/2008    Screening Tests Health Maintenance  Topic Date Due   Zoster Vaccines- Shingrix (1 of 2) 04/01/1967   Pneumococcal Vaccine: 50+ Years (3 of 3 - PCV20 or PCV21) 12/22/2018   Fecal DNA (Cologuard)  06/19/2022   COVID-19 Vaccine (4 - 2024-25 season) 11/08/2022   INFLUENZA VACCINE  10/08/2023   DTaP/Tdap/Td (3 - Td or Tdap) 12/22/2023   Medicare Annual Wellness (AWV)  10/25/2024   Hepatitis C Screening  Completed   HPV VACCINES  Aged Out   Meningococcal B Vaccine  Aged Out   Colonoscopy  Discontinued    Health Maintenance  Health Maintenance Due  Topic Date Due   Zoster Vaccines- Shingrix (1 of 2) 04/01/1967   Pneumococcal Vaccine: 50+ Years (3 of 3 - PCV20 or PCV21) 12/22/2018   Fecal DNA (Cologuard)  06/19/2022   COVID-19 Vaccine (4  - 2024-25 season) 11/08/2022   INFLUENZA VACCINE  10/08/2023   Health Maintenance Items Addressed: Due for covid, flu and pneumonia vaccine.States had cologuard recently through insurance.  Additional Screening:  Vision Screening: Recommended annual ophthalmology exams for early detection of glaucoma and other disorders of the eye. Would you like a referral to an eye doctor? No    Dental Screening: Recommended annual dental exams for proper oral hygiene  Community Resource Referral / Chronic Care Management: CRR required this visit?  No   CCM required this visit?  No   Plan:    I have personally reviewed and noted the following in the patient's chart:   Medical and social history Use of alcohol, tobacco or illicit drugs  Current medications and supplements including opioid prescriptions. Patient is not currently taking opioid prescriptions. Functional ability and status Nutritional status Physical activity Advanced directives List of other physicians Hospitalizations, surgeries, and ER visits in previous 12 months Vitals  Screenings to include cognitive, depression, and falls Referrals and appointments  In addition, I have reviewed and discussed with patient certain preventive protocols, quality metrics, and best practice recommendations. A written personalized care plan for preventive services as well as general preventive health recommendations were provided to patient.   Ardella FORBES Dawn, LPN   1/[id number]/7974   After Visit Summary: (MyChart) Due to this being a telephonic visit, the after visit summary with patients personalized plan was offered to patient via MyChart   Notes: Nothing significant to report at this time.

## 2023-10-27 DIAGNOSIS — Z961 Presence of intraocular lens: Secondary | ICD-10-CM | POA: Diagnosis not present

## 2023-10-27 DIAGNOSIS — H5340 Unspecified visual field defects: Secondary | ICD-10-CM | POA: Diagnosis not present

## 2023-10-27 DIAGNOSIS — H524 Presbyopia: Secondary | ICD-10-CM | POA: Diagnosis not present

## 2023-10-28 DIAGNOSIS — D649 Anemia, unspecified: Secondary | ICD-10-CM | POA: Diagnosis not present

## 2023-10-28 DIAGNOSIS — F909 Attention-deficit hyperactivity disorder, unspecified type: Secondary | ICD-10-CM | POA: Diagnosis not present

## 2023-10-28 DIAGNOSIS — H53462 Homonymous bilateral field defects, left side: Secondary | ICD-10-CM | POA: Diagnosis not present

## 2023-10-28 DIAGNOSIS — E785 Hyperlipidemia, unspecified: Secondary | ICD-10-CM | POA: Diagnosis not present

## 2023-10-28 DIAGNOSIS — F419 Anxiety disorder, unspecified: Secondary | ICD-10-CM | POA: Diagnosis not present

## 2023-10-28 DIAGNOSIS — Z8719 Personal history of other diseases of the digestive system: Secondary | ICD-10-CM

## 2023-10-28 DIAGNOSIS — M519 Unspecified thoracic, thoracolumbar and lumbosacral intervertebral disc disorder: Secondary | ICD-10-CM | POA: Diagnosis not present

## 2023-10-28 DIAGNOSIS — R32 Unspecified urinary incontinence: Secondary | ICD-10-CM

## 2023-10-28 DIAGNOSIS — K5790 Diverticulosis of intestine, part unspecified, without perforation or abscess without bleeding: Secondary | ICD-10-CM

## 2023-10-28 DIAGNOSIS — R7303 Prediabetes: Secondary | ICD-10-CM | POA: Diagnosis not present

## 2023-10-28 DIAGNOSIS — I69398 Other sequelae of cerebral infarction: Secondary | ICD-10-CM | POA: Diagnosis not present

## 2023-10-28 DIAGNOSIS — I119 Hypertensive heart disease without heart failure: Secondary | ICD-10-CM | POA: Diagnosis not present

## 2023-10-29 DIAGNOSIS — E785 Hyperlipidemia, unspecified: Secondary | ICD-10-CM | POA: Diagnosis not present

## 2023-10-29 DIAGNOSIS — R7303 Prediabetes: Secondary | ICD-10-CM | POA: Diagnosis not present

## 2023-10-29 DIAGNOSIS — H53462 Homonymous bilateral field defects, left side: Secondary | ICD-10-CM | POA: Diagnosis not present

## 2023-10-29 DIAGNOSIS — F419 Anxiety disorder, unspecified: Secondary | ICD-10-CM | POA: Diagnosis not present

## 2023-10-29 DIAGNOSIS — I119 Hypertensive heart disease without heart failure: Secondary | ICD-10-CM | POA: Diagnosis not present

## 2023-10-29 DIAGNOSIS — I69398 Other sequelae of cerebral infarction: Secondary | ICD-10-CM | POA: Diagnosis not present

## 2023-10-29 DIAGNOSIS — F909 Attention-deficit hyperactivity disorder, unspecified type: Secondary | ICD-10-CM | POA: Diagnosis not present

## 2023-10-29 DIAGNOSIS — D649 Anemia, unspecified: Secondary | ICD-10-CM | POA: Diagnosis not present

## 2023-10-29 DIAGNOSIS — M519 Unspecified thoracic, thoracolumbar and lumbosacral intervertebral disc disorder: Secondary | ICD-10-CM | POA: Diagnosis not present

## 2023-10-31 DIAGNOSIS — F419 Anxiety disorder, unspecified: Secondary | ICD-10-CM | POA: Diagnosis not present

## 2023-10-31 DIAGNOSIS — H53462 Homonymous bilateral field defects, left side: Secondary | ICD-10-CM | POA: Diagnosis not present

## 2023-10-31 DIAGNOSIS — F909 Attention-deficit hyperactivity disorder, unspecified type: Secondary | ICD-10-CM | POA: Diagnosis not present

## 2023-10-31 DIAGNOSIS — E785 Hyperlipidemia, unspecified: Secondary | ICD-10-CM | POA: Diagnosis not present

## 2023-10-31 DIAGNOSIS — I69398 Other sequelae of cerebral infarction: Secondary | ICD-10-CM | POA: Diagnosis not present

## 2023-10-31 DIAGNOSIS — R7303 Prediabetes: Secondary | ICD-10-CM | POA: Diagnosis not present

## 2023-10-31 DIAGNOSIS — M519 Unspecified thoracic, thoracolumbar and lumbosacral intervertebral disc disorder: Secondary | ICD-10-CM | POA: Diagnosis not present

## 2023-10-31 DIAGNOSIS — D649 Anemia, unspecified: Secondary | ICD-10-CM | POA: Diagnosis not present

## 2023-10-31 DIAGNOSIS — I119 Hypertensive heart disease without heart failure: Secondary | ICD-10-CM | POA: Diagnosis not present

## 2023-11-01 DIAGNOSIS — I119 Hypertensive heart disease without heart failure: Secondary | ICD-10-CM | POA: Diagnosis not present

## 2023-11-01 DIAGNOSIS — I69398 Other sequelae of cerebral infarction: Secondary | ICD-10-CM | POA: Diagnosis not present

## 2023-11-01 DIAGNOSIS — M519 Unspecified thoracic, thoracolumbar and lumbosacral intervertebral disc disorder: Secondary | ICD-10-CM | POA: Diagnosis not present

## 2023-11-01 DIAGNOSIS — E785 Hyperlipidemia, unspecified: Secondary | ICD-10-CM | POA: Diagnosis not present

## 2023-11-01 DIAGNOSIS — D649 Anemia, unspecified: Secondary | ICD-10-CM | POA: Diagnosis not present

## 2023-11-01 DIAGNOSIS — F419 Anxiety disorder, unspecified: Secondary | ICD-10-CM | POA: Diagnosis not present

## 2023-11-01 DIAGNOSIS — R7303 Prediabetes: Secondary | ICD-10-CM | POA: Diagnosis not present

## 2023-11-01 DIAGNOSIS — H53462 Homonymous bilateral field defects, left side: Secondary | ICD-10-CM | POA: Diagnosis not present

## 2023-11-01 DIAGNOSIS — F909 Attention-deficit hyperactivity disorder, unspecified type: Secondary | ICD-10-CM | POA: Diagnosis not present

## 2023-11-03 DIAGNOSIS — I119 Hypertensive heart disease without heart failure: Secondary | ICD-10-CM | POA: Diagnosis not present

## 2023-11-03 DIAGNOSIS — M519 Unspecified thoracic, thoracolumbar and lumbosacral intervertebral disc disorder: Secondary | ICD-10-CM | POA: Diagnosis not present

## 2023-11-03 DIAGNOSIS — I69398 Other sequelae of cerebral infarction: Secondary | ICD-10-CM | POA: Diagnosis not present

## 2023-11-03 DIAGNOSIS — R7303 Prediabetes: Secondary | ICD-10-CM | POA: Diagnosis not present

## 2023-11-03 DIAGNOSIS — H53462 Homonymous bilateral field defects, left side: Secondary | ICD-10-CM | POA: Diagnosis not present

## 2023-11-03 DIAGNOSIS — E785 Hyperlipidemia, unspecified: Secondary | ICD-10-CM | POA: Diagnosis not present

## 2023-11-03 DIAGNOSIS — F419 Anxiety disorder, unspecified: Secondary | ICD-10-CM | POA: Diagnosis not present

## 2023-11-03 DIAGNOSIS — F909 Attention-deficit hyperactivity disorder, unspecified type: Secondary | ICD-10-CM | POA: Diagnosis not present

## 2023-11-03 DIAGNOSIS — D649 Anemia, unspecified: Secondary | ICD-10-CM | POA: Diagnosis not present

## 2023-11-04 DIAGNOSIS — F419 Anxiety disorder, unspecified: Secondary | ICD-10-CM | POA: Diagnosis not present

## 2023-11-04 DIAGNOSIS — F909 Attention-deficit hyperactivity disorder, unspecified type: Secondary | ICD-10-CM | POA: Diagnosis not present

## 2023-11-04 DIAGNOSIS — D649 Anemia, unspecified: Secondary | ICD-10-CM | POA: Diagnosis not present

## 2023-11-04 DIAGNOSIS — H53462 Homonymous bilateral field defects, left side: Secondary | ICD-10-CM | POA: Diagnosis not present

## 2023-11-04 DIAGNOSIS — I69398 Other sequelae of cerebral infarction: Secondary | ICD-10-CM | POA: Diagnosis not present

## 2023-11-04 DIAGNOSIS — E785 Hyperlipidemia, unspecified: Secondary | ICD-10-CM | POA: Diagnosis not present

## 2023-11-04 DIAGNOSIS — R7303 Prediabetes: Secondary | ICD-10-CM | POA: Diagnosis not present

## 2023-11-04 DIAGNOSIS — I119 Hypertensive heart disease without heart failure: Secondary | ICD-10-CM | POA: Diagnosis not present

## 2023-11-04 DIAGNOSIS — M519 Unspecified thoracic, thoracolumbar and lumbosacral intervertebral disc disorder: Secondary | ICD-10-CM | POA: Diagnosis not present

## 2023-11-05 ENCOUNTER — Encounter: Payer: Self-pay | Admitting: Physical Therapy

## 2023-11-05 ENCOUNTER — Other Ambulatory Visit: Payer: Self-pay

## 2023-11-05 ENCOUNTER — Ambulatory Visit: Attending: Medical | Admitting: Physical Therapy

## 2023-11-05 DIAGNOSIS — R2681 Unsteadiness on feet: Secondary | ICD-10-CM | POA: Insufficient documentation

## 2023-11-05 DIAGNOSIS — R2689 Other abnormalities of gait and mobility: Secondary | ICD-10-CM | POA: Insufficient documentation

## 2023-11-05 DIAGNOSIS — M6281 Muscle weakness (generalized): Secondary | ICD-10-CM | POA: Insufficient documentation

## 2023-11-05 NOTE — Therapy (Addendum)
 OUTPATIENT PHYSICAL THERAPY NEURO EVALUATION   Patient Name: Terry Hays MRN: [id number] DOB:1950-09-10, 73 y.o., male Today's Date: 11/05/2023   PCP: [name], MD  REFERRING PROVIDER: Bulah Alm RAMAN, PA-C   END OF SESSION:  PT End of Session - 11/05/23 2046     Visit Number 1    Number of Visits 13    Date for PT Re-Evaluation 12/17/23    Authorization Type [employer] Medicare-submitted auth at eval    Progress Note Due on Visit 10    PT Start Time 0850    PT Stop Time 0930    PT Time Calculation (min) 40 min    Equipment Utilized During Treatment Gait belt    Activity Tolerance Patient tolerated treatment well    Behavior During Therapy [facility] for tasks assessed/performed          Past Medical History:  Diagnosis Date   Abnormal ECG    Allergy    Anxiety    FLYING   Diverticulosis    Dyslipidemia    History of prostate cancer    Hyperlipidemia    Hypertension    LVH (left ventricular hypertrophy)    Pre-diabetes    Past Surgical History:  Procedure Laterality Date   COLONOSCOPY  2004 2011   MAGOD   EYE SURGERY  03/10/1999   CATOACTS BOTH   HERNIA REPAIR  03/09/1952   R INGHINAL   INGUINAL HERNIA REPAIR Left 04/07/2018   Procedure: OPERN LEFT INGUINAL HERNIA REPAIR WITH MESH ERAS PATHWAY;  Surgeon: Tanda Locus, MD;  Location: [facility];  Service: General;  Laterality: Left;   LOOP RECORDER INSERTION N/A 09/17/2023   Procedure: LOOP RECORDER INSERTION;  Surgeon: Lesia Ozell Barter, PA-C;  Location: [facility] INVASIVE CV LAB;  Service: Cardiovascular;  Laterality: N/A;   PENILE PROSTHESIS IMPLANT  03/09/2009   DAHLSTEDT   PROSTATE SURGERY  03/09/2001   PROSTRATECTOMY   Patient Active Problem List   Diagnosis Date Noted   Cognitive impairment 09/12/2023   Acute CVA (cerebrovascular accident) (HCC) 09/09/2023   Acute cerebrovascular accident (CVA) due to occlusion of right posterior cerebral artery (HCC) 09/08/2023   Normocytic anemia 09/08/2023    Right lower lobe pulmonary nodule 09/08/2023   Attention deficit hyperactivity disorder (ADHD), predominantly hyperactive type 06/03/2021   Cherry angioma 06/03/2021   Seborrheic keratosis 06/03/2021   Arthritis 03/17/2018   Spinal stenosis of lumbosacral region 03/17/2018   Actinic keratosis 03/17/2018   Degeneration of lumbar intervertebral disc 10/27/2017   History of prostate cancer 09/15/2011   Fear of flying 09/15/2011   Family history of heart disease in male family member before age 4 09/15/2011   Allergic rhinitis 09/15/2011   Hemorrhoids 09/15/2011   Hypertension 06/27/2010   Dyslipidemia    LVH (left ventricular hypertrophy)     ONSET DATE: 10/20/2023 (MD referral)  REFERRING DIAG:  Z86.73 (ICD-10-CM) - History of stroke  H54.7 (ICD-10-CM) - Vision loss  Z92.89 (ICD-10-CM) - History of recent hospitalization    THERAPY DIAG:  Unsteadiness on feet  Other abnormalities of gait and mobility  Muscle weakness (generalized)  Rationale for Evaluation and Treatment: Rehabilitation  SUBJECTIVE:  SUBJECTIVE STATEMENT: Having trouble with vision in R and L periphery so I have to be careful with my balance since the stroke in July.  Use the cane now, but never needed to use the cane before.  Historically, the right knee is a bad knee, has gone out before; happened after using some weights on R side with therapy.  Have appointment with neuro-ophthalmologist at [facility] next week. Pt accompanied by: significant other  PERTINENT HISTORY: PMH prior CVAs (unknown to patient), ADHD, HTN, HLD, prostate cancer, diverticulosis who presented to the ER with confusion and left visual field changes (09/2023)  PAIN:  Are you having pain? No  PRECAUTIONS: Fall and Other: decreased peripheral vision from  CVA   Avoid driving RED FLAGS: None   WEIGHT BEARING RESTRICTIONS: No  FALLS: Has patient fallen in last 6 months? Yes. Number of falls 1-R knee gave way  LIVING ENVIRONMENT: Lives with: lives with their spouse Lives in: House/apartment Stairs: steps to enter with rails; upstairs and downstairs with rail Has following equipment at home: Single point cane  PLOF: Independent and Leisure: played pickleball and golf; was working out for core strengthening and walking several miles everyday  PATIENT GOALS: To get back to playing pickleball [facility]) and golf  OBJECTIVE:  Note: Objective measures were completed at Evaluation unless otherwise noted.  DIAGNOSTIC FINDINGS: acute CVA of the right posterior cerebral artery,   COGNITION: Overall cognitive status: Within functional limits for tasks assessed   SENSATION: Light touch: WFL  COORDINATION: WFL for bilat alt taps   MUSCLE TONE: WFL  POSTURE: rounded shoulders  LOWER EXTREMITY ROM:   WFL BLEs   LOWER EXTREMITY MMT:    MMT Right Eval Left Eval  Hip flexion 4 4+  Hip extension    Hip abduction 5 5  Hip adduction 5 5  Hip internal rotation    Hip external rotation    Knee flexion 5 5  Knee extension 4 4  Ankle dorsiflexion 4 4  Ankle plantarflexion    Ankle inversion    Ankle eversion    (Blank rows = not tested)  TRANSFERS: Sit to stand: Modified independence  Assistive device utilized: None     Stand to sit: Modified independence  Assistive device utilized: None      GAIT: Findings: Gait Characteristics: veers to R, step through pattern, and wide BOS, Distance walked: 50 ft, Assistive device utilized:Single point cane, and Level of assistance: SBA  FUNCTIONAL TESTS:  5 times sit to stand: 13.91 sec Timed up and go (TUG): NT 10 meter walk test: 13.41 veers to R= 2.44 ft/sec FGA= 16/30  M-CTSIB  Condition 1: Firm Surface, EO 30 Sec, Normal Sway  Condition 2: Firm Surface, EC 30 Sec, Normal  Sway  Condition 3: Foam Surface, EO 30 Sec, Mild Sway  Condition 4: Foam Surface, EC 30 Sec, Mild and Moderate Sway    [facility] PT Assessment - 11/05/23 0921       Functional Gait  Assessment   Gait assessed  Yes    Gait Level Surface Walks 20 ft, slow speed, abnormal gait pattern, evidence for imbalance or deviates 10-15 in outside of the 12 in walkway width. Requires more than 7 sec to ambulate 20 ft.   8.31   Change in Gait Speed Able to change speed, demonstrates mild gait deviations, deviates 6-10 in outside of the 12 in walkway width, or no gait deviations, unable to achieve a major change in velocity, or uses a change  in velocity, or uses an assistive device.    Gait with Horizontal Head Turns Performs head turns smoothly with slight change in gait velocity (eg, minor disruption to smooth gait path), deviates 6-10 in outside 12 in walkway width, or uses an assistive device.    Gait with Vertical Head Turns Performs task with slight change in gait velocity (eg, minor disruption to smooth gait path), deviates 6 - 10 in outside 12 in walkway width or uses assistive device    Gait and Pivot Turn Pivot turns safely within 3 sec and stops quickly with no loss of balance.    Step Over Obstacle Is able to step over one shoe box (4.5 in total height) but must slow down and adjust steps to clear box safely. May require verbal cueing.    Gait with Narrow Base of Support Ambulates less than 4 steps heel to toe or cannot perform without assistance.    Gait with Eyes Closed Walks 20 ft, slow speed, abnormal gait pattern, evidence for imbalance, deviates 10-15 in outside 12 in walkway width. Requires more than 9 sec to ambulate 20 ft.   13.07sec   Ambulating Backwards Walks 20 ft, uses assistive device, slower speed, mild gait deviations, deviates 6-10 in outside 12 in walkway width.   18.38   Steps Alternating feet, must use rail.    Total Score 16    FGA comment: Scores <22/30 indicate increased fall risk                                                                                                                                       TREATMENT DATE: 11/05/2023    PATIENT EDUCATION: Education details: Eval results, POC to address balance, gait; discussed decreased fall risk and plans to address; recommended continuing to use cane for safety with gait Person educated: Patient and Spouse Education method: Explanation Education comprehension: verbalized understanding  HOME EXERCISE PROGRAM: Not yet initiated  GOALS: Goals reviewed with patient? Yes  SHORT TERM GOALS: Target date: 11/19/2023  Pt will be independent with HEP for improved balance and gait. Baseline: Goal status: INITIAL  2.  Pt will improve 5x sit<>stand to less than or equal to 12 sec to demonstrate improved functional strength and transfer efficiency. Baseline: 13.41 sec Goal status: INITIAL   LONG TERM GOALS: Target date: 12/17/2023  Pt will be independent with HEP for improved balance, gait. Baseline:  Goal status: INITIAL  2.  Pt will improve FGA score to at least 23/30 to decrease fall risk. Baseline: 16/30 Goal status: INITIAL  3.  Pt will improve gait velocity to at least 2.62 ft/sec for improved gait efficiency and safety. Baseline: 2.44 ft/sec Goal status: INITIAL  4.  Pt will verbalize understanding of fall prevention in home environment. Baseline:  Goal status: INITIAL  5.  Pt will verbalize plans for continued community fitness upon d/c from PT, to maximize gains  made in PT. Baseline:  Goal status: INITIAL   ASSESSMENT:  CLINICAL IMPRESSION: Patient is a 73 y.o. male who was seen today for physical therapy evaluation and treatment for CVA.  He had CVA in July 2025, resulting in vision changes.  He reports decreased peripheral vision R and L; he has seen an eye doctor and is to see neuro-ophthalmologist this week.  He presents to PT eval with decreased strength, decreased balance,  decreased independence with gait.  He is at fall risk per FGA score of 16/30 and FTSTS score of 13.41 sec.  He is classified as a limited community ambulator with gait velocity of 2.44 ft/sec.  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  He would benefit from skilled PT to address the above stated deficits to decrease fall risk and improve overall functional mobility.   OBJECTIVE IMPAIRMENTS: Abnormal gait, decreased balance, decreased mobility, difficulty walking, and decreased strength.   ACTIVITY LIMITATIONS: transfers and locomotion level  PARTICIPATION LIMITATIONS: driving, community activity, and fitness/leisure activities  PERSONAL FACTORS: 3+ comorbidities: PMH above are also affecting patient's functional outcome.   REHAB POTENTIAL: Good  CLINICAL DECISION MAKING: Stable/uncomplicated  EVALUATION COMPLEXITY: Low  PLAN:  PT FREQUENCY: 2x/week  PT DURATION: 6 weeks plus eval  PLANNED INTERVENTIONS: 97750- Physical Performance Testing, 97110-Therapeutic exercises, 97530- Therapeutic activity, [id number]- Neuromuscular re-education, 97535- Self Care, [id number]- Gait training, Patient/Family education, and Balance training  PLAN FOR NEXT SESSION: Initiate HEP, dynamic gait and balance; work on lower extremity strengthening.  *Need to make sure to schedule appts once OT/speech eval is complete.   STARLET GREIG ORN., PT 11/05/2023, 8:50 PM   Outpatient Rehab at [facility] [address] [place], [address] [place], [place] [address] Phone # [phone] Fax # [phone]  Referring diagnosis?  Z86.73 (ICD-10-CM) - History of stroke  H54.7 (ICD-10-CM) - Vision loss  Z92.89 (ICD-10-CM) - History of recent hospitalization   Treatment diagnosis? (if different than referring diagnosis) R 26.81, R 26.89, M62.81 What was this (referring dx) caused by? []  Surgery []  Fall []  Ongoing issue []  Arthritis [x]  Other:  ____________  Laterality: []  Rt []  Lt [x]  Both  Check all possible CPT codes:  *CHOOSE 10 OR LESS*    See Planned Interventions listed in the Plan section of the Evaluation.

## 2023-11-06 DIAGNOSIS — Z8673 Personal history of transient ischemic attack (TIA), and cerebral infarction without residual deficits: Secondary | ICD-10-CM | POA: Insufficient documentation

## 2023-11-09 ENCOUNTER — Ambulatory Visit: Admitting: Family Medicine

## 2023-11-09 ENCOUNTER — Encounter: Payer: Self-pay | Admitting: Family Medicine

## 2023-11-09 VITALS — BP 138/70 | HR 61 | Ht 68.0 in | Wt 191.4 lb

## 2023-11-09 DIAGNOSIS — H547 Unspecified visual loss: Secondary | ICD-10-CM | POA: Diagnosis not present

## 2023-11-09 DIAGNOSIS — K921 Melena: Secondary | ICD-10-CM | POA: Diagnosis not present

## 2023-11-09 DIAGNOSIS — I10 Essential (primary) hypertension: Secondary | ICD-10-CM | POA: Diagnosis not present

## 2023-11-09 DIAGNOSIS — Z8673 Personal history of transient ischemic attack (TIA), and cerebral infarction without residual deficits: Secondary | ICD-10-CM

## 2023-11-09 DIAGNOSIS — R4189 Other symptoms and signs involving cognitive functions and awareness: Secondary | ICD-10-CM

## 2023-11-09 DIAGNOSIS — F901 Attention-deficit hyperactivity disorder, predominantly hyperactive type: Secondary | ICD-10-CM

## 2023-11-09 LAB — CBC WITH DIFFERENTIAL/PLATELET
Basophils Absolute: 0 x10E3/uL (ref 0.0–0.2)
Basos: 1 %
EOS (ABSOLUTE): 0.2 x10E3/uL (ref 0.0–0.4)
Eos: 4 %
Hematocrit: 47.9 % (ref 37.5–51.0)
Hemoglobin: 15.3 g/dL (ref 13.0–17.7)
Immature Grans (Abs): 0 x10E3/uL (ref 0.0–0.1)
Immature Granulocytes: 0 %
Lymphocytes Absolute: 1.9 x10E3/uL (ref 0.7–3.1)
Lymphs: 28 %
MCH: 28.9 pg (ref 26.6–33.0)
MCHC: 31.9 g/dL (ref 31.5–35.7)
MCV: 90 fL (ref 79–97)
Monocytes Absolute: 0.8 x10E3/uL (ref 0.1–0.9)
Monocytes: 11 %
Neutrophils Absolute: 3.9 x10E3/uL (ref 1.4–7.0)
Neutrophils: 56 %
Platelets: 245 x10E3/uL (ref 150–450)
RBC: 5.3 x10E6/uL (ref 4.14–5.80)
RDW: 13.5 % (ref 11.6–15.4)
WBC: 6.9 x10E3/uL (ref 3.4–10.8)

## 2023-11-09 MED ORDER — FERROUS SULFATE 325 (65 FE) MG PO TABS: 325.0000 mg | ORAL_TABLET | Freq: Every day | ORAL | 3 refills | Status: DC

## 2023-11-09 MED ORDER — PANTOPRAZOLE SODIUM 40 MG PO TBEC
40.0000 mg | DELAYED_RELEASE_TABLET | Freq: Two times a day (BID) | ORAL | 0 refills | Status: DC
Start: 2023-11-09 — End: 2023-12-14

## 2023-11-09 NOTE — Progress Notes (Signed)
 Subjective:    Patient ID: Terry Hays, male    DOB: 1950/06/27, 73 y.o.   MRN: [id number]  Discussed the use of AI scribe software for clinical note transcription with the patient, who gave verbal consent to proceed.  History of Present Illness   Terry Hays is a 73 year old male with a history of stroke who presents with visual changes and gastrointestinal bleeding.  He has been experiencing significant visual changes following a stroke, with perfect vision straight down the middle but issues with peripheral vision. He is undergoing exercises to improve his side vision, which involves moving his eyes to the side without moving his head, and notes gradual improvement.  He is scheduled to see a neuro-ophthalmologist at [facility] in the near future.  He has been experiencing gastrointestinal issues, including black tarry stools, which he associates probably associated with the use of Aleve. The first episode occurred the day before his stroke, and he experienced another episode approximately 12 days ago after taking Aleve for knee pain. No abdominal pain is associated with these episodes. He has a history of a knee injury from basketball, which occasionally causes pain. He has undergone Cologuard testing, which was negative, and is scheduled to see Dr. Rosalie for further evaluation tomorrow.  He is currently on an aspirin  regimen of 81 mg daily for stroke prevention. He is also taking pantoprazole  twice daily and an iron supplement daily with breakfast.  He is undergoing outpatient physical and occupational therapy. He is actively participating in these therapies and is working on regaining his independence. He feels frustrated with his current limitations but is motivated to improve.  He has a history of ADHD and was previously on Vyvanse, which was discontinued during his hospital stay. He has difficulty with focus and mindfulness, particularly in avoiding obstacles. He is considering  discussing this with a psychiatrist. He is also contemplating the use of SSRIs for mood management, as he is experiencing frustration and anger related to his health condition.           Review of Systems     Objective:    Physical Exam Alert and in no distress otherwise not examined           Assessment & Plan:  Cerebrovascular accident (stroke) with residual bilateral hemianopsia and cognitive changes Residual bilateral hemianopsia and cognitive changes post-stroke. Improvement in central vision with exercises; peripheral deficits persist. Cognitive function stable with some fatigue. - Continue outpatient physical therapy and occupational therapy. - Attend neuro-ophthalmologist appointment at [facility]. - Continue eye exercises as instructed.  Depression and emotional adjustment post-stroke Emotional adjustment difficulties post-stroke, including frustration and anger. Not on SSRI. Scheduled for psychiatric evaluation. - Consider resuming counseling sessions with current counselor. - Discuss emotional adjustment with psychiatrist and counselor.  Attention-deficit hyperactivity disorder (ADHD) Difficulty with focus and attention, especially avoiding obstacles. Vyvanse discontinued. Potential Wellbutrin use discussed. Awaiting psychiatric evaluation. - Discuss focus and attention issues with psychiatrist. - Consider Wellbutrin as a potential treatment option because of the norepinephrine effect and possible benefit with ADHD.SABRA  Gastrointestinal bleeding (melena), likely NSAID-induced, under evaluation for iron deficiency anemia Intermittent melena likely due to NSAID use. Recent black stools post-Aleve. Scheduled for GI evaluation. Hemoglobin to be checked for anemia. - Check hemoglobin levels today. - Attend GI appointment with Dr. Rosalie. - Avoid NSAIDs such as Aleve and Advil. - Continue Protonix  (pantoprazole ) twice daily. - Continue iron supplementation (ferrous sulfate )  daily with breakfast.  Hypertension  On aspirin  for stroke prevention.  Loop recorder in place for cardiac monitoring post-stroke Loop recorder monitoring for cardiac arrhythmias post-stroke. Follow-up plan with cardiologist needed. - Contact cardiologist to discuss plan for loop recorder monitoring.      Over 40 minutes spent discussing all these issues with him and his wife and reviewing of his medical record.

## 2023-11-10 ENCOUNTER — Ambulatory Visit: Payer: Self-pay | Admitting: Family Medicine

## 2023-11-10 ENCOUNTER — Other Ambulatory Visit: Payer: Self-pay

## 2023-11-10 ENCOUNTER — Ambulatory Visit

## 2023-11-10 ENCOUNTER — Ambulatory Visit: Attending: Medical | Admitting: Occupational Therapy

## 2023-11-10 DIAGNOSIS — H53462 Homonymous bilateral field defects, left side: Secondary | ICD-10-CM | POA: Diagnosis not present

## 2023-11-10 DIAGNOSIS — K921 Melena: Secondary | ICD-10-CM

## 2023-11-10 DIAGNOSIS — R41842 Visuospatial deficit: Secondary | ICD-10-CM | POA: Insufficient documentation

## 2023-11-10 DIAGNOSIS — R41841 Cognitive communication deficit: Secondary | ICD-10-CM | POA: Insufficient documentation

## 2023-11-10 DIAGNOSIS — M6281 Muscle weakness (generalized): Secondary | ICD-10-CM | POA: Diagnosis not present

## 2023-11-10 DIAGNOSIS — R2689 Other abnormalities of gait and mobility: Secondary | ICD-10-CM | POA: Insufficient documentation

## 2023-11-10 DIAGNOSIS — I69815 Cognitive social or emotional deficit following other cerebrovascular disease: Secondary | ICD-10-CM | POA: Diagnosis not present

## 2023-11-10 DIAGNOSIS — R2681 Unsteadiness on feet: Secondary | ICD-10-CM | POA: Diagnosis not present

## 2023-11-10 DIAGNOSIS — H5410 Blindness, one eye, low vision other eye, unspecified eyes: Secondary | ICD-10-CM | POA: Diagnosis not present

## 2023-11-10 DIAGNOSIS — I639 Cerebral infarction, unspecified: Secondary | ICD-10-CM | POA: Diagnosis not present

## 2023-11-10 NOTE — Therapy (Signed)
 OUTPATIENT SPEECH LANGUAGE PATHOLOGY EVALUATION   Patient Name: Terry Hays MRN: [id number] DOB:May 21, 1950, 73 y.o., male Today's Date: 11/10/2023  PCP: Joyce Rush, MD REFERRING PROVIDER: Bulah Alm RIGGERS  END OF SESSION:  End of Session - 11/10/23 1544     Visit Number 1    Number of Visits 17    Date for SLP Re-Evaluation 02/04/24   based upon start date for therapy   SLP Start Time 0848    SLP Stop Time  0930    SLP Time Calculation (min) 42 min    Activity Tolerance Patient tolerated treatment well          Past Medical History:  Diagnosis Date   Abnormal ECG    Allergy    Anxiety    FLYING   Diverticulosis    Dyslipidemia    History of prostate cancer    Hyperlipidemia    Hypertension    LVH (left ventricular hypertrophy)    Pre-diabetes    Past Surgical History:  Procedure Laterality Date   COLONOSCOPY  2004 2011   MAGOD   EYE SURGERY  03/10/1999   CATOACTS BOTH   HERNIA REPAIR  03/09/1952   R INGHINAL   INGUINAL HERNIA REPAIR Left 04/07/2018   Procedure: OPERN LEFT INGUINAL HERNIA REPAIR WITH MESH ERAS PATHWAY;  Surgeon: Tanda Locus, MD;  Location: [facility];  Service: General;  Laterality: Left;   LOOP RECORDER INSERTION N/A 09/17/2023   Procedure: LOOP RECORDER INSERTION;  Surgeon: Lesia Ozell Barter, PA-C;  Location: [facility] INVASIVE CV LAB;  Service: Cardiovascular;  Laterality: N/A;   PENILE PROSTHESIS IMPLANT  03/09/2009   DAHLSTEDT   PROSTATE SURGERY  03/09/2001   PROSTRATECTOMY   Patient Active Problem List   Diagnosis Date Noted   History of CVA (cerebrovascular accident) 11/06/2023   Cognitive impairment 09/12/2023   Right lower lobe pulmonary nodule 09/08/2023   Attention deficit hyperactivity disorder (ADHD), predominantly hyperactive type 06/03/2021   Cherry angioma 06/03/2021   Seborrheic keratosis 06/03/2021   Arthritis 03/17/2018   Spinal stenosis of lumbosacral region 03/17/2018   Actinic keratosis 03/17/2018    Degeneration of lumbar intervertebral disc 10/27/2017   History of prostate cancer 09/15/2011   Fear of flying 09/15/2011   Family history of heart disease in male family member before age 37 09/15/2011   Allergic rhinitis 09/15/2011   Hemorrhoids 09/15/2011   Hypertension 06/27/2010   Dyslipidemia    LVH (left ventricular hypertrophy)     ONSET DATE: 09/08/23   REFERRING DIAG: S13.26 (ICD-10-CM) - Personal history of transient ischemic attack (TIA), and cerebral infarction without residual deficits Z92.89 (ICD-10-CM) - Personal history of other medical treatment H54.7 (ICD-10-CM) - Unspecified visual loss  THERAPY DIAG:  Cognitive communication deficit  Rationale for Evaluation and Treatment: Rehabilitation  SUBJECTIVE:   SUBJECTIVE STATEMENT: I tell myself to be mindful where I left my cane and then I can't remember.  Pt accompanied by: significant other - [name]  PERTINENT HISTORY:  73yo male admitted 09/08/23 with confusion, left visual field cut. PMH: HTN, HLD, prediabetes, prostate cancer, diverticulosis. MRI - mod-lg (sub)acute right PCA infarct in the medial right occipital lobe. Small chronic infarcts left cerebellum.   PAIN:  Are you having pain? No  FALLS: Has patient fallen in last 6 months?  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  PATIENT GOALS: Improvement in memory   OBJECTIVE:  Note: Objective measures were completed at Evaluation unless otherwise noted.  DIAGNOSTIC FINDINGS:  SLE - 09/09/23 Pt seen for cognitive linguistic evaluation at bedside. Pt was awake and alert. Wife, son, and daughter present during assessment. Pt reports PhD in Sociology, independent with finances and medications prior  to admit. Family reports concern regarding decline in attention and working memory prior to admit. Pt speech is fully intelligible. Receptive and Expressive Language are intact. Pt scored 21/30 on the [employer] Mental Status (SLUMS) Examination, indicating mild neurocognitive deficits. Pt exhibited difficulty with delayed recall, attention, mental math (problem solving), auditory attention/recall, and clock drawing (executive functions). Recommend skilled ST intervention at next venue of care to maximize independence and safety and minimize caregiver burden. Pt/family and medical team informed of results and recommendations.  MRI WO CONTRAST: 09/08/23 IMPRESSION: Moderate to large acute/subacute right PCA territory infarct largely involving the medial right occipital lobe. Small chronic infarcts left cerebellum. Age-related change.   COGNITION: Overall cognitive status: Impaired and History of cognitive impairments - at baseline; pt with premorbid dx of ADHD - was on Vyvanse but now has been removed. Pt to speak with psychiatrist about replacement med for attention tomorrow. Areas of impairment:  Attention: Impaired: Sustained, Selective, Alternating, Divided, Comment: SLP wonders if there would have been a difference in today's testing had pt been medicated for ADHD Memory: Impaired: Short term Awareness: Impaired: Intellectual, emergent. Pt was unaware of errors made in symbol cancellation task (I was just looking for the tentacles, pt stated), generative naming (Yeah, I forgot about the capital M didn't I?), and in symbol trails task (Oh, I didn't even see those over there) until SLP cued him that he made errors, and cues to see them. Some splinter skills in anticipatory awareness however pt did not ever look to left of paper in tasks unless cued to do so by SLP.   Functional deficits: [name] told SLP, I can tell him something and 5 minutes later he asks me about it again. Pt  states he has difficulty remembering where he placed his cane.   COGNITIVE COMMUNICATION: Following directions: Follows multi-step commands inconsistently  Auditory comprehension: Impaired: as per pt's attention/memory Verbal expression: WFL Functional communication: Impaired: inasmuch as pt's attention and memory affect his auditory comprehension  ORAL MOTOR EXAMINATION: Overall status: WFL   STANDARDIZED ASSESSMENTS: CLQT initiated today. Will complete next session. If pt begins attention medicine prior to next session, SLP will re-assess subtests completed today.  PATIENT REPORTED OUTCOME MEASURES (PROM): Cognitive Function: to be provided in first 1-2 sessions                                                                                                                            TREATMENT DATE:   11/10/23: n/a  PATIENT EDUCATION: Education details: Results thus far in  evaluation, will complete eval next session, suggest psychiatrist work with pt ASAP to find attention med for him, expected sx from CVA was located. Person educated: Patient and SO Education method: Explanation Education comprehension: verbalized understanding and needs further education   GOALS: Goals reviewed with patient? No  SHORT TERM GOALS: Target date: 01/07/24  Pt will complete CLQT in first 2 sessions Baseline: Goal status: INITIAL  2.  In cognitive linguistic written tasks pt will look to lt to complete with nonverbal cues to achieve 100% success in 3 sessions Baseline:  Goal status: INITIAL  3.  Pt will tell SLP 2 deficit areas without cues in 2 sessions Baseline:  Goal status: INITIAL  4.  Pt will tell SLP 3 functional memory strategies in 3 sessions Baseline:  Goal status: INITIAL    LONG TERM GOALS: Target date: 02/04/24  Pt will improve PROM from initial administration Baseline:  Goal status: INITIAL  2.  Pt will use 2 trained memory strategies in/between three  sessions Baseline:  Goal status: INITIAL  3.  Pt will complete mod complex/complex written cognitive linguistic tasks with 100% given double checking and self correction in 3 sessions Baseline:  Goal status: INITIAL   ASSESSMENT:  CLINICAL IMPRESSION: Patient is a 73 y.o. M who was seen today for assessment of cognitive linguistics after CVA 09/08/23. Pt with premorbid dx of ADHD who was NOT medicated today. Pt also with lt inattention who req'd cues for looking to lt in evaluation tasks as he did not do so independently. He demonstrated memory deficits as well as awareness deficits, and also attention deficits however SLP unsure at this time how pt's performacne would have been with this aspect had he been medicated for ADHD.  Pt to talk with psychiatrist tomorrow - SLP strongly encouraged pt to obtain information from that professional tomorrow for assistance with meds for attention. For more details please see cognition above. If ascertained pt's attention skills are worse than while medicated prior to CVA, SLP will add goal/s for attention.  OBJECTIVE IMPAIRMENTS: include attention, memory, awareness, and receptive language. These impairments are limiting patient from return to work, managing medications, managing appointments, managing finances, household responsibilities, ADLs/IADLs, and effectively communicating at home and in community. Factors affecting potential to achieve goals and functional outcome are ability to learn/carryover information and previous level of function.. Patient will benefit from skilled SLP services to address above impairments and improve overall function.  REHAB POTENTIAL: Good  PLAN:  SLP FREQUENCY: 2x/week  SLP DURATION: 8 weeks  PLANNED INTERVENTIONS: Cueing hierachy, Cognitive reorganization, Internal/external aids, Functional tasks, SLP instruction and feedback, Compensatory strategies, Patient/family education, and [id number] Treatment of speech (30 or 45  min)     Cameo Schmiesing, CCC-SLP 11/10/2023, 3:54 PM

## 2023-11-10 NOTE — Progress Notes (Signed)
 Sent CBC to Dr. Oliva Boots

## 2023-11-10 NOTE — Therapy (Signed)
 OUTPATIENT OCCUPATIONAL THERAPY NEURO EVALUATION  Patient Name: Terry Hays MRN: [id number] DOB:[birth year], 73 y.o., male Today's Date: 11/10/2023  PCP: [name], MD REFERRING PROVIDER: Bulah Alm RAMAN, PA-C  END OF SESSION:  OT End of Session - 11/10/23 1222     Visit Number 1    Number of Visits 17    Date for OT Re-Evaluation 01/05/24    Authorization Type [employer] Choice PPO    OT Start Time 0808    OT Stop Time 0847    OT Time Calculation (min) 39 min    Activity Tolerance Patient tolerated treatment well    Behavior During Therapy [facility] for tasks assessed/performed          Past Medical History:  Diagnosis Date   Abnormal ECG    Allergy    Anxiety    FLYING   Diverticulosis    Dyslipidemia    History of prostate cancer    Hyperlipidemia    Hypertension    LVH (left ventricular hypertrophy)    Pre-diabetes    Past Surgical History:  Procedure Laterality Date   COLONOSCOPY  2004 2011   MAGOD   EYE SURGERY  03/10/1999   CATOACTS BOTH   HERNIA REPAIR  03/09/1952   R INGHINAL   INGUINAL HERNIA REPAIR Left 04/07/2018   Procedure: OPERN LEFT INGUINAL HERNIA REPAIR WITH MESH ERAS PATHWAY;  Surgeon: Tanda Locus, MD;  Location: [facility];  Service: General;  Laterality: Left;   LOOP RECORDER INSERTION N/A 09/17/2023   Procedure: LOOP RECORDER INSERTION;  Surgeon: Lesia Ozell Barter, PA-C;  Location: [facility] INVASIVE CV LAB;  Service: Cardiovascular;  Laterality: N/A;   PENILE PROSTHESIS IMPLANT  03/09/2009   DAHLSTEDT   PROSTATE SURGERY  03/09/2001   PROSTRATECTOMY   Patient Active Problem List   Diagnosis Date Noted   History of CVA (cerebrovascular accident) 11/06/2023   Cognitive impairment 09/12/2023   Right lower lobe pulmonary nodule 09/08/2023   Attention deficit hyperactivity disorder (ADHD), predominantly hyperactive type 06/03/2021   Cherry angioma 06/03/2021   Seborrheic keratosis 06/03/2021   Arthritis 03/17/2018   Spinal  stenosis of lumbosacral region 03/17/2018   Actinic keratosis 03/17/2018   Degeneration of lumbar intervertebral disc 10/27/2017   History of prostate cancer 09/15/2011   Fear of flying 09/15/2011   Family history of heart disease in male family member before age 31 09/15/2011   Allergic rhinitis 09/15/2011   Hemorrhoids 09/15/2011   Hypertension 06/27/2010   Dyslipidemia    LVH (left ventricular hypertrophy)     ONSET DATE: late June 2025  REFERRING DIAG: Z86.73, Z92.89, H54.7  THERAPY DIAG:  Hemianopia, homonymous, left  Muscle weakness (generalized)  Blindness, one eye, low vision other eye, unspecified eyes  Cognitive social or emotional deficit following other cerebrovascular disease  Cerebrovascular accident (CVA), unspecified mechanism (HCC)  Rationale for Evaluation and Treatment: Rehabilitation  SUBJECTIVE:   SUBJECTIVE STATEMENT: Pt reports a line in visual field and to the L its like it isnt even there. Pt accompanied by: self and significant other  PERTINENT HISTORY: hx of falls  PRECAUTIONS: None  WEIGHT BEARING RESTRICTIONS: No  PAIN:  Are you having pain? No  FALLS: Has patient fallen in last 6 months? Yes. Number of falls unclear, but fell a couple weeks ago  LIVING ENVIRONMENT: Lives with: lives with their spouse Lives in: House/apartment Stairs: Yes: Internal: unclear but has a rail for internal steps steps; steps to enter home with rail and rail doing downstairs  Has following equipment at home: Single point cane  PLOF: Independent, Independent with basic ADLs, Independent with household mobility without device, and Independent with transfers  PATIENT GOALS: Pt would like to be able use his computer again for email, typing in word, etc. Would also love to be able to golf, play pickle ball etc.   OBJECTIVE:  Note: Objective measures were completed at Evaluation unless otherwise noted.  HAND DOMINANCE: Left  ADLs: Overall ADL: Mod I  at this time, using a SPC for mobility.   IADLs: Overall pt is not driving and has assist for grocery shopping, bills, cooking, IADLs at this time.  Handwriting: 90% legible  MOBILITY STATUS: Hx of falls and needs SPC  POSTURE COMMENTS:  rounded shoulders Sitting balance: decresaed core strength noted but overall demonstrates functional sitting balance.   ACTIVITY TOLERANCE: Activity tolerance: decreased but functional for ADL tasks, not able to do IADL tasks such as golf and pickleball  FUNCTIONAL OUTCOME MEASURES: NA - pt truly does not feel like he has LUE deficits from CVA and wants to work on vision  UPPER EXTREMITY ROM:    Active ROM Right eval Left eval  Shoulder flexion    Shoulder abduction    Shoulder adduction    Shoulder extension    Shoulder internal rotation    Shoulder external rotation    Elbow flexion    Elbow extension    Wrist flexion    Wrist extension    Wrist ulnar deviation    Wrist radial deviation    Wrist pronation    Wrist supination    (Blank rows = not tested)  UPPER EXTREMITY MMT:     MMT Right eval Left eval  Shoulder flexion 150 WFL  Shoulder abduction    Shoulder adduction    Shoulder extension    Shoulder internal rotation    Shoulder external rotation    Middle trapezius    Lower trapezius    Elbow flexion    Elbow extension    Wrist flexion    Wrist extension    Wrist ulnar deviation    Wrist radial deviation    Wrist pronation    Wrist supination    (Blank rows = not tested)  Note mild deficits in B shoulders for shoulder flexion 2/2 old injuries from falls   HAND FUNCTION: WFL - note pt verbose and arrived late to evaluation, did not have time to asses grip strength as pt wanted to focus on vision. Will plan to assess in future sessions.   COORDINATION: Finger Nose Finger test: dysmetria and decreased coordination noted but unclear if this is from visual deficits to aim for other hand    SENSATION: WFL  EDEMA: none  MUSCLE TONE: RUE: Within functional limits and LUE: Within functional limits  COGNITION: Overall cognitive status: Impaired and Difficulty to assess due to: Note pt does not think deficits are significant but statements from S.O. in room states that cognition has been limited for comprehension, short term memory, and higher level tasks.   VISION: Subjective report: Pt reports he cannot see anything on the left - like its not even there. States he has learned various scanning and compensatory techniques.  Baseline vision: no glassess but demonstrates hemianopsia  Visual history: none prior to CVA   VISION ASSESSMENT: Impaired To be further assessed in functional context Eye alignment: Impaired:   Tracking/Visual pursuits: Decreased smoothness with horizontal tracking, Decreased smoothness with vertical tracking, Decreased smoothness of eye movement to Left  superior field, Decreased smoothness of eye movement to Left inferior field, and Requires cues, head turns, or add eye shifts to track Visual Fields: Left homonymous hemianopsia  Patient has difficulty with following activities due to following visual impairments: driving, cooking, pickleball, golfing, has ad recent falls.   PERCEPTION: Impaired: Inattention/neglect: does not attend to left visual field  PRAXIS: Not tested  OBSERVATIONS: The pt is a 73 yo male who is accompanied by his S.O. this date walking with a SPC who demonstrates dignificant visual deficits post CVA. Pt demonstrates slight deficits in BUE shoulder ROM but is fairly symmetrical and good MMT - does not feel like strength has been impacted from CVA from a neuro stand point. Pt with significant visual field deficits, L hemianopsia, and decreased attention to L visual field impacting skill performance.                                                                                                                              TREATMENT DATE:  11/10/23  Initial evaluation completed this date, see above and below for details. Pt and S.O. educated on OT role and function as well as POC, answered questions regarding incooperating visual scanning into daily routine as well as visual field cut and CVA.         PATIENT EDUCATION: Education details: educated on visual deficits and field cut Person educated: Patient and significant other Education method: Explanation Education comprehension: needs further education  HOME EXERCISE PROGRAM: TBD    GOALS: Goals reviewed with patient? Yes  SHORT TERM GOALS: Target date: 12/08/23  Pt will be Supervision with vision specific HEP to further integrate strategies and scanning into daily life for max recovery and benefit.  Baseline: Pt does incooperate techniques from hospital but would benefit from further training  Goal status: INITIAL  2.  Pt will demonstrate improved visual scanning to locate 5 items in a room with no more than 2 verbal cues to locate items.  Baseline:  5 items, needing 3+ cues for scanning Goal status: INITIAL  3.  Pt will demonstrate improved visual acuity and/or use of adaptations to use his personal computer for leisure activities such as email, typing in Word, etc with no more than Supervision.  Baseline: Pt reports significant difficulty with this Goal status: INITIAL  4.  Pt will perform a visual scanning task/worksheet of choice with no more than 2 errors in order to demonstrate improved scanning and acuity.  Baseline:  Goal status: INITIAL    LONG TERM GOALS: Target date: 01/05/24  Pt will be MOD I with vision specific HEP to further integrate strategies and scanning into daily life for max recovery and benefit.  Baseline: Pt does incooperate techniques from hospital but would benefit from further training  Goal status: INITIAL  2.  Pt will demonstrate improved visual scanning to locate 5 items in a room with no verbal cues to  locate items.  Baseline:  5  items, needing 3+ cues for scanning Goal status: INITIAL  3.  Pt will demonstrate improved visual acuity and/or use of adaptations to use his personal computer for leisure activities such as email, typing in Word, etc with Independence. Baseline: Pt reports significant difficulty with this Goal status: INITIAL  4.  If safe and appropriate, pt will report a return to some leisure activities such as modified golf, pickle ball, walking, etc with use of strategies, AE, or compensatory techniques to improve safety and participation.  Baseline: not safe and not performing at this time Goal status: INITIAL    ASSESSMENT:  CLINICAL IMPRESSION: Patient is a 74  y.o. male  who was seen today for occupational therapy evaluation for significant visual deficits post CVA.   PERFORMANCE DEFICITS: in functional skills including ADLs, IADLs, proprioception, endurance, and vision, cognitive skills including attention, memory, problem solving, safety awareness, and sequencing, and psychosocial skills including coping strategies, environmental adaptation, habits, and routines and behaviors.   IMPAIRMENTS: are limiting patient from ADLs, IADLs, leisure, and social participation.   CO-MORBIDITIES: has no other co-morbidities that affects occupational performance. Patient will benefit from skilled OT to address above impairments and improve overall function.  MODIFICATION OR ASSISTANCE TO COMPLETE EVALUATION: Min-Moderate modification of tasks or assist with assess necessary to complete an evaluation.  OT OCCUPATIONAL PROFILE AND HISTORY: Detailed assessment: Review of records and additional review of physical, cognitive, psychosocial history related to current functional performance.  CLINICAL DECISION MAKING: Moderate - several treatment options, min-mod task modification necessary  REHAB POTENTIAL: Good  EVALUATION COMPLEXITY: Moderate    PLAN:  OT FREQUENCY:  2x/week  OT DURATION: 8 weeks  PLANNED INTERVENTIONS: 97168 OT Re-evaluation, 97535 self care/ADL training, 02889 therapeutic exercise, 97530 therapeutic activity, and 97112 neuromuscular re-education  RECOMMENDED OTHER SERVICES: NA  CONSULTED AND AGREED WITH PLAN OF CARE: Patient and family member/caregiver  PLAN FOR NEXT SESSION: vision!! Anything visual scanning, visual acuity, worksheets for scanning, cancelling task, etc. Obstacle course?   Chiquita JAYSON Hopping, OT 11/10/2023, 12:26 PM

## 2023-11-11 DIAGNOSIS — F1020 Alcohol dependence, uncomplicated: Secondary | ICD-10-CM | POA: Diagnosis not present

## 2023-11-11 DIAGNOSIS — F321 Major depressive disorder, single episode, moderate: Secondary | ICD-10-CM | POA: Diagnosis not present

## 2023-11-11 DIAGNOSIS — F902 Attention-deficit hyperactivity disorder, combined type: Secondary | ICD-10-CM | POA: Diagnosis not present

## 2023-11-15 DIAGNOSIS — H53483 Generalized contraction of visual field, bilateral: Secondary | ICD-10-CM | POA: Diagnosis not present

## 2023-11-15 DIAGNOSIS — H53462 Homonymous bilateral field defects, left side: Secondary | ICD-10-CM | POA: Diagnosis not present

## 2023-11-16 ENCOUNTER — Encounter: Payer: Self-pay | Admitting: Ophthalmology

## 2023-11-19 ENCOUNTER — Ambulatory Visit

## 2023-11-19 DIAGNOSIS — I10 Essential (primary) hypertension: Secondary | ICD-10-CM | POA: Diagnosis not present

## 2023-11-21 NOTE — Therapy (Signed)
 OUTPATIENT OCCUPATIONAL THERAPY NEURO TREATMENT  Patient Name: Terry Hays MRN: [id number] DOB:1950-09-20, 73 y.o., male Today's Date: 11/22/2023  PCP: [name], MD REFERRING PROVIDER: Bulah Alm RAMAN, PA-C  END OF SESSION:  OT End of Session - 11/22/23 0935     Visit Number 2    Number of Visits 17    Date for OT Re-Evaluation 01/05/24    Authorization Type [employer] PPO    OT Start Time 0934    OT Stop Time 1028    OT Time Calculation (min) 54 min    Activity Tolerance Patient tolerated treatment well    Behavior During Therapy [facility] for tasks assessed/performed           Past Medical History:  Diagnosis Date   Abnormal ECG    Allergy    Anxiety    FLYING   Diverticulosis    Dyslipidemia    History of prostate cancer    Hyperlipidemia    Hypertension    LVH (left ventricular hypertrophy)    Pre-diabetes    Past Surgical History:  Procedure Laterality Date   COLONOSCOPY  2004 2011   MAGOD   EYE SURGERY  03/10/1999   CATOACTS BOTH   HERNIA REPAIR  03/09/1952   R INGHINAL   INGUINAL HERNIA REPAIR Left 04/07/2018   Procedure: OPERN LEFT INGUINAL HERNIA REPAIR WITH MESH ERAS PATHWAY;  Surgeon: Tanda Locus, MD;  Location: [facility];  Service: General;  Laterality: Left;   LOOP RECORDER INSERTION N/A 09/17/2023   Procedure: LOOP RECORDER INSERTION;  Surgeon: Lesia Ozell Barter, PA-C;  Location: [facility] INVASIVE CV LAB;  Service: Cardiovascular;  Laterality: N/A;   PENILE PROSTHESIS IMPLANT  03/09/2009   DAHLSTEDT   PROSTATE SURGERY  03/09/2001   PROSTRATECTOMY   Patient Active Problem List   Diagnosis Date Noted   History of CVA (cerebrovascular accident) 11/06/2023   Cognitive impairment 09/12/2023   Right lower lobe pulmonary nodule 09/08/2023   Attention deficit hyperactivity disorder (ADHD), predominantly hyperactive type 06/03/2021   Cherry angioma 06/03/2021   Seborrheic keratosis 06/03/2021   Arthritis 03/17/2018   Spinal  stenosis of lumbosacral region 03/17/2018   Actinic keratosis 03/17/2018   Degeneration of lumbar intervertebral disc 10/27/2017   History of prostate cancer 09/15/2011   Fear of flying 09/15/2011   Family history of heart disease in male family member before age 35 09/15/2011   Allergic rhinitis 09/15/2011   Hemorrhoids 09/15/2011   Hypertension 06/27/2010   Dyslipidemia    LVH (left ventricular hypertrophy)     ONSET DATE: late June 2025  REFERRING DIAG: Z86.73, Z92.89, H54.7  THERAPY DIAG:  Visuospatial deficit  Unsteadiness on feet  Rationale for Evaluation and Treatment: Rehabilitation  SUBJECTIVE:   SUBJECTIVE STATEMENT: Pt reports 1 fall approx a month ago.  Pt reports that he can't see.   Pt reports that he had neuro ophthalmologist last week, but that they said eyes haven't gotten any better.  My ultimate goal is to drive  Pt accompanied by: self and significant other  PERTINENT HISTORY: hx of falls  PRECAUTIONS: None  WEIGHT BEARING RESTRICTIONS: No  PAIN:  Are you having pain? No  FALLS: Has patient fallen in last 6 months? Yes. Number of falls unclear, but fell a couple weeks ago  LIVING ENVIRONMENT: Lives with: lives with their spouse Lives in: House/apartment Stairs: Yes: Internal: unclear but has a rail for internal steps steps; steps to enter home with rail and rail doing downstairs Has  following equipment at home: Single point cane  PLOF: Independent, Independent with basic ADLs, Independent with household mobility without device, and Independent with transfers  PATIENT GOALS: Pt would like to be able use his computer again for email, typing in word, etc. Would also love to be able to golf, play pickle ball etc.   OBJECTIVE:  Note: Objective measures were completed at Evaluation unless otherwise noted.  HAND DOMINANCE: Left  ADLs: Overall ADL: Mod I at this time, using a SPC for mobility.   IADLs: Overall pt is not driving and has  assist for grocery shopping, bills, cooking, IADLs at this time.  Handwriting: 90% legible  MOBILITY STATUS: Hx of falls and needs SPC  POSTURE COMMENTS:  rounded shoulders Sitting balance: decresaed core strength noted but overall demonstrates functional sitting balance.   ACTIVITY TOLERANCE: Activity tolerance: decreased but functional for ADL tasks, not able to do IADL tasks such as golf and pickleball  FUNCTIONAL OUTCOME MEASURES: NA - pt truly does not feel like he has LUE deficits from CVA and wants to work on vision  UPPER EXTREMITY ROM:    Active ROM Right eval Left eval  Shoulder flexion    Shoulder abduction    Shoulder adduction    Shoulder extension    Shoulder internal rotation    Shoulder external rotation    Elbow flexion    Elbow extension    Wrist flexion    Wrist extension    Wrist ulnar deviation    Wrist radial deviation    Wrist pronation    Wrist supination    (Blank rows = not tested)  UPPER EXTREMITY MMT:     MMT Right eval Left eval  Shoulder flexion 150 WFL  Shoulder abduction    Shoulder adduction    Shoulder extension    Shoulder internal rotation    Shoulder external rotation    Middle trapezius    Lower trapezius    Elbow flexion    Elbow extension    Wrist flexion    Wrist extension    Wrist ulnar deviation    Wrist radial deviation    Wrist pronation    Wrist supination    (Blank rows = not tested)  Note mild deficits in B shoulders for shoulder flexion 2/2 old injuries from falls   HAND FUNCTION: WFL - note pt verbose and arrived late to evaluation, did not have time to asses grip strength as pt wanted to focus on vision. Will plan to assess in future sessions.   COORDINATION: Finger Nose Finger test: dysmetria and decreased coordination noted but unclear if this is from visual deficits to aim for other hand   SENSATION: WFL  EDEMA: none  MUSCLE TONE: RUE: Within functional limits and LUE: Within functional  limits  COGNITION: Overall cognitive status: Impaired and Difficulty to assess due to: Note pt does not think deficits are significant but statements from S.O. in room states that cognition has been limited for comprehension, short term memory, and higher level tasks.   VISION: Subjective report: Pt reports he cannot see anything on the left - like its not even there. States he has learned various scanning and compensatory techniques.  Baseline vision: no glassess but demonstrates hemianopsia  Visual history: none prior to CVA   VISION ASSESSMENT: Impaired To be further assessed in functional context Eye alignment: Impaired:   Tracking/Visual pursuits: Decreased smoothness with horizontal tracking, Decreased smoothness with vertical tracking, Decreased smoothness of eye movement to Left superior  field, Decreased smoothness of eye movement to Left inferior field, and Requires cues, head turns, or add eye shifts to track Visual Fields: Left homonymous hemianopsia  Patient has difficulty with following activities due to following visual impairments: driving, cooking, pickleball, golfing, has ad recent falls.   PERCEPTION: Impaired: Inattention/neglect: does not attend to left visual field  PRAXIS: Not tested  OBSERVATIONS: The pt is a 73 yo male who is accompanied by his S.O. this date walking with a SPC who demonstrates dignificant visual deficits post CVA. Pt demonstrates slight deficits in BUE shoulder ROM but is fairly symmetrical and good MMT - does not feel like strength has been impacted from CVA from a neuro stand point. Pt with significant visual field deficits, L hemianopsia, and decreased attention to L visual field impacting skill performance.                                                                                                                             TREATMENT DATE:  11/22/23:  Pt instructed in visual HEP and compensation strategies.  Visual scanning/Number  cancellation sheet (1.69M) with approx 80% accuracy.  Pt utilized organized scanning pattern but missed 2 entire rows and with multiple other errors.  Recommended line guide and use of finger to keep his place.  Also discussed using visual anchors and emphasized always using organized scanning pattern.  Discussed errors to incr awareness.  Completing 12-piece puzzle with mod cueing for visual compensation strategies and problem solving (looking for edge of table, head turns, look for 1 piece at a time, edge pieces first, looking at pattern, double checking).      PATIENT EDUCATION: Education details: Engineer, structural and compensation strategies.  Discussed visual requirements for driving and that focus on OT will be compensation strategy use.  Person educated: Patient and Spouse Education method: Explanation Education comprehension: needs further education  HOME EXERCISE PROGRAM: 11/22/23:  Visual HEP and compensation strategies    GOALS: Goals reviewed with patient? Yes  SHORT TERM GOALS: Target date: 12/08/23  Pt will be Supervision with vision specific HEP to further integrate strategies and scanning into daily life for max recovery and benefit.  Baseline: Pt does incooperate techniques from hospital but would benefit from further training  Goal status: Ongoing 11/22/23, educated, but would benefit from review  2.  Pt will demonstrate improved visual scanning to locate 5 items in a room with no more than 2 verbal cues to locate items.  Baseline:  5 items, needing 3+ cues for scanning Goal status: INITIAL  3.  Pt will demonstrate improved visual acuity and/or use of adaptations to use his personal computer for leisure activities such as email, typing in Word, etc with no more than Supervision.  Baseline: Pt reports significant difficulty with this Goal status: INITIAL  4.  Pt will perform a visual scanning task/worksheet of choice with no more than 2 errors in order to demonstrate improved  scanning and acuity.  Baseline:  Goal status: INITIAL    LONG TERM GOALS: Target date: 01/05/24  Pt will be MOD I with vision specific HEP to further integrate strategies and scanning into daily life for max recovery and benefit.  Baseline: Pt does incooperate techniques from hospital but would benefit from further training  Goal status: INITIAL  2.  Pt will demonstrate improved visual scanning to locate 5 items in a room with no verbal cues to locate items.  Baseline:  5 items, needing 3+ cues for scanning Goal status: INITIAL  3.  Pt will demonstrate improved visual acuity and/or use of adaptations to use his personal computer for leisure activities such as email, typing in Word, etc with Independence. Baseline: Pt reports significant difficulty with this Goal status: INITIAL  4.  If safe and appropriate, pt will report a return to some leisure activities such as modified golf, pickle ball, walking, etc with use of strategies, AE, or compensatory techniques to improve safety and participation.  Baseline: not safe and not performing at this time Goal status: INITIAL    ASSESSMENT:  CLINICAL IMPRESSION: Pt/wife verbalized understanding of visual compensation strategies and visual HEP, but will benefit from reinforcement and practice with strategies to incr carryover.  PERFORMANCE DEFICITS: in functional skills including ADLs, IADLs, proprioception, endurance, and vision, cognitive skills including attention, memory, problem solving, safety awareness, and sequencing, and psychosocial skills including coping strategies, environmental adaptation, habits, and routines and behaviors.   IMPAIRMENTS: are limiting patient from ADLs, IADLs, leisure, and social participation.   CO-MORBIDITIES: has no other co-morbidities that affects occupational performance. Patient will benefit from skilled OT to address above impairments and improve overall function.  MODIFICATION OR ASSISTANCE TO  COMPLETE EVALUATION: Min-Moderate modification of tasks or assist with assess necessary to complete an evaluation.  OT OCCUPATIONAL PROFILE AND HISTORY: Detailed assessment: Review of records and additional review of physical, cognitive, psychosocial history related to current functional performance.  CLINICAL DECISION MAKING: Moderate - several treatment options, min-mod task modification necessary  REHAB POTENTIAL: Good  EVALUATION COMPLEXITY: Moderate    PLAN:  OT FREQUENCY: 2x/week  OT DURATION: 8 weeks  PLANNED INTERVENTIONS: 97168 OT Re-evaluation, 97535 self care/ADL training, 02889 therapeutic exercise, 97530 therapeutic activity, and 97112 neuromuscular re-education  RECOMMENDED OTHER SERVICES: NA  CONSULTED AND AGREED WITH PLAN OF CARE: Patient and family member/caregiver  PLAN FOR NEXT SESSION:  review visual compensation strategies, environmental scanning, tabletop scanning   Sahas Sluka, OTR/L 11/22/2023, 10:56 AM

## 2023-11-22 ENCOUNTER — Ambulatory Visit: Admitting: Physical Therapy

## 2023-11-22 ENCOUNTER — Ambulatory Visit: Admitting: Occupational Therapy

## 2023-11-22 ENCOUNTER — Encounter: Payer: Self-pay | Admitting: Occupational Therapy

## 2023-11-22 ENCOUNTER — Encounter: Payer: Self-pay | Admitting: Physical Therapy

## 2023-11-22 DIAGNOSIS — H53462 Homonymous bilateral field defects, left side: Secondary | ICD-10-CM | POA: Diagnosis not present

## 2023-11-22 DIAGNOSIS — R41841 Cognitive communication deficit: Secondary | ICD-10-CM | POA: Diagnosis not present

## 2023-11-22 DIAGNOSIS — R2689 Other abnormalities of gait and mobility: Secondary | ICD-10-CM

## 2023-11-22 DIAGNOSIS — I69815 Cognitive social or emotional deficit following other cerebrovascular disease: Secondary | ICD-10-CM | POA: Diagnosis not present

## 2023-11-22 DIAGNOSIS — R2681 Unsteadiness on feet: Secondary | ICD-10-CM | POA: Diagnosis not present

## 2023-11-22 DIAGNOSIS — M6281 Muscle weakness (generalized): Secondary | ICD-10-CM

## 2023-11-22 DIAGNOSIS — R41842 Visuospatial deficit: Secondary | ICD-10-CM | POA: Diagnosis not present

## 2023-11-22 DIAGNOSIS — H5410 Blindness, one eye, low vision other eye, unspecified eyes: Secondary | ICD-10-CM | POA: Diagnosis not present

## 2023-11-22 DIAGNOSIS — I639 Cerebral infarction, unspecified: Secondary | ICD-10-CM | POA: Diagnosis not present

## 2023-11-22 LAB — CUP PACEART REMOTE DEVICE CHECK
Date Time Interrogation Session: 20250912050300
Implantable Pulse Generator Implant Date: 20250711
Pulse Gen Model: 5000
Pulse Gen Serial Number: 511069369

## 2023-11-22 NOTE — Patient Instructions (Signed)
 Visual Compensation Strategies:  1. Look for the edge of objects (to the left and/or right) so that you make sure you are seeing all of an object 2. Turn your head when walking, scan from side to side, particularly in busy environments 3. Use an organized scanning pattern. It's usually easier to scan from top to bottom, and left to right (like you are reading) 4. Double check yourself 5. Use a line guide (like a blank piece of paper) or your finger when reading 6. If necessary, place brightly colored tape at end of table or work area as a reminder to always look until you see the tape.  7.  Have someone with you in busy/unfamiliar environments for safety (on affected side) 8.  Organize items in baskets and/or with labels so they are easier to see/find 9.  Remove clutter 10.  Make sure you have good lighting. 11.  Consider using contrast/brightly colored strips on steps to make it easier to see the edge. 12.  When you enter a new space, stop and take a look around first before trying to move.   13.   Double check yourself 14.  Large print will be easier to see    Vision Activities:  1. Simple word searches 2. Read out loud so someone can correct you if you miss a word 3. Simple Jigsaw puzzle 4. Scavenger hunt--look around house for objects (something a given color, list of objects, cards placed around home, etc). 5. Look for a list of items at a grocery store (with supervision) 6. Card/board games (matching, connect 4, etc.) 7. Matching Games on computer  8.  With someone with you, look for items in grocery or drug store from list.  Begin with a familiar store, then progress to a new store you've never been in before. 9.  Mazes or Connect-the-dots 10. With supervision, tell a family member or caregiver when it is safe to cross a street after looking all directions and any side streets. However, do NOT cross street unless family member or caregiver is with you and says it is OK.   10.  Scavenger hunt-type activities.  (Look for blue items, signs, have some put cards around room, etc). 11.  When riding in car, tell the driver directions

## 2023-11-22 NOTE — Therapy (Signed)
 OUTPATIENT PHYSICAL THERAPY NEURO TREATMENT NOTE   Patient Name: Terry Hays MRN: [id number] DOB:05/06/1950, 73 y.o., male Today's Date: 11/22/2023   PCP: [name], MD  REFERRING PROVIDER: Bulah Alm RAMAN, PA-C   END OF SESSION:  PT End of Session - 11/22/23 1851     Visit Number 2    Number of Visits 13    Date for PT Re-Evaluation 12/17/23    Authorization Type [employer] Medicare-submitted auth at eval    Authorization Time Period 8-29 - 12-17-23    Authorization - Visit Number 2    Authorization - Number of Visits 13    Progress Note Due on Visit 10    PT Start Time 0847    PT Stop Time 0930    PT Time Calculation (min) 43 min    Equipment Utilized During Treatment Gait belt    Activity Tolerance Patient tolerated treatment well    Behavior During Therapy WFL for tasks assessed/performed           Past Medical History:  Diagnosis Date   Abnormal ECG    Allergy    Anxiety    FLYING   Diverticulosis    Dyslipidemia    History of prostate cancer    Hyperlipidemia    Hypertension    LVH (left ventricular hypertrophy)    Pre-diabetes    Past Surgical History:  Procedure Laterality Date   COLONOSCOPY  2004 2011   MAGOD   EYE SURGERY  03/10/1999   CATOACTS BOTH   HERNIA REPAIR  03/09/1952   R INGHINAL   INGUINAL HERNIA REPAIR Left 04/07/2018   Procedure: OPERN LEFT INGUINAL HERNIA REPAIR WITH MESH ERAS PATHWAY;  Surgeon: Tanda Locus, MD;  Location: [facility];  Service: General;  Laterality: Left;   LOOP RECORDER INSERTION N/A 09/17/2023   Procedure: LOOP RECORDER INSERTION;  Surgeon: Lesia Ozell Barter, PA-C;  Location: [facility] INVASIVE CV LAB;  Service: Cardiovascular;  Laterality: N/A;   PENILE PROSTHESIS IMPLANT  03/09/2009   DAHLSTEDT   PROSTATE SURGERY  03/09/2001   PROSTRATECTOMY   Patient Active Problem List   Diagnosis Date Noted   History of CVA (cerebrovascular accident) 11/06/2023   Cognitive impairment 09/12/2023   Right lower  lobe pulmonary nodule 09/08/2023   Attention deficit hyperactivity disorder (ADHD), predominantly hyperactive type 06/03/2021   Cherry angioma 06/03/2021   Seborrheic keratosis 06/03/2021   Arthritis 03/17/2018   Spinal stenosis of lumbosacral region 03/17/2018   Actinic keratosis 03/17/2018   Degeneration of lumbar intervertebral disc 10/27/2017   History of prostate cancer 09/15/2011   Fear of flying 09/15/2011   Family history of heart disease in male family member before age 39 09/15/2011   Allergic rhinitis 09/15/2011   Hemorrhoids 09/15/2011   Hypertension 06/27/2010   Dyslipidemia    LVH (left ventricular hypertrophy)     ONSET DATE: 10/20/2023 (MD referral)  REFERRING DIAG:  Z86.73 (ICD-10-CM) - History of stroke  H54.7 (ICD-10-CM) - Vision loss  Z92.89 (ICD-10-CM) - History of recent hospitalization    THERAPY DIAG:  Muscle weakness (generalized)  Unsteadiness on feet  Other abnormalities of gait and mobility  Rationale for Evaluation and Treatment: Rehabilitation  SUBJECTIVE:  SUBJECTIVE STATEMENT: Pt transferring to this clinic from [facility] due to it being closer location to his home; pt reports he had CVA in July that affected his vision (has Lt homonymous hemianopsia)  Had neuroopthalmology appt last week at [facility];  pt reports he has to be conscious of Rt knee going out;  pt using cane for assistance with ambulation but says he does not use cane in the house Pt accompanied by: self (friend drove him to today's PT appt)   PERTINENT HISTORY: PMH prior CVAs (unknown to patient), ADHD, HTN, HLD, prostate cancer, diverticulosis who presented to the ER with confusion and left visual field changes (09/2023)  PAIN:  Are you having pain? No  PRECAUTIONS: Fall and Other:  decreased peripheral vision from CVA   Avoid driving RED FLAGS: None   WEIGHT BEARING RESTRICTIONS: No  FALLS: Has patient fallen in last 6 months? Yes. Number of falls 1-R knee gave way  LIVING ENVIRONMENT: Lives with: lives with their spouse Lives in: House/apartment Stairs: steps to enter with rails; upstairs and downstairs with rail Has following equipment at home: Single point cane  PLOF: Independent and Leisure: played pickleball and golf; was working out for core strengthening and walking several miles everyday  PATIENT GOALS: To get back to playing pickleball [facility]) and golf  OBJECTIVE:  Note: Objective measures were completed at Evaluation unless otherwise noted.  DIAGNOSTIC FINDINGS: acute CVA of the right posterior cerebral artery,   COGNITION: Overall cognitive status: Within functional limits for tasks assessed   SENSATION: Light touch: WFL  COORDINATION: WFL for bilat alt taps   MUSCLE TONE: WFL  POSTURE: rounded shoulders  LOWER EXTREMITY ROM:   WFL BLEs   LOWER EXTREMITY MMT:    MMT Right Eval Left Eval  Hip flexion 4 4+  Hip extension    Hip abduction 5 5  Hip adduction 5 5  Hip internal rotation    Hip external rotation    Knee flexion 5 5  Knee extension 4 4  Ankle dorsiflexion 4 4  Ankle plantarflexion    Ankle inversion    Ankle eversion    (Blank rows = not tested)  TRANSFERS: Sit to stand: Modified independence  Assistive device utilized: None     Stand to sit: Modified independence  Assistive device utilized: None      GAIT: Findings: Gait Characteristics: veers to R, step through pattern, and wide BOS, Distance walked: 50 ft, Assistive device utilized:Single point cane, and Level of assistance: SBA  FUNCTIONAL TESTS:  5 times sit to stand: 13.91 sec Timed up and go (TUG): NT 10 meter walk test: 13.41 veers to R= 2.44 ft/sec FGA= 16/30  M-CTSIB  Condition 1: Firm Surface, EO 30 Sec, Normal Sway  Condition 2:  Firm Surface, EC 30 Sec, Normal Sway  Condition 3: Foam Surface, EO 30 Sec, Mild Sway  Condition 4: Foam Surface, EC 30 Sec, Mild and Moderate Sway  TREATMENT DATE: 11-22-23  TherEx: 5x sit to stand from chair - score 10.22 secs without UE support from chair  Heel raises unilateral -LLE with bil. UE support on counter 10 reps:  RLE 10 reps with less UE support on counter (Issued Lt unilateral heel raise for HEP)  NeuroRe-ed: SLS - 4.6 secs on LLE:  5.7 secs on RLE Tandem stance  30 secs each foot position - performed at counter to have UE support prn Sit to stand - feet on Airex 3 reps with EO with CGA   Pt performed tossing and catching ball in standing - straight up;  then performed tossing/catching ball on Rt/Lt sides 5 reps each side  Pt performed kicking 5 bean bags 20' x 2 reps - alternating feet -to improve SLS and to increase visual attention to Lt side - cues needed to turn head to see bean bags on far Lt side on floor  Pt performed amb. With head turns horizontally 20' x 2 reps - no device used  Access Code: [email or web address] URL: [email or web address] Date: 11/22/2023 Prepared by: Rock Kussmaul  Exercises - Single Leg Stance with Support  - 1 x daily - 7 x weekly - 1 sets - 2-3 reps - 10 secs hold - Tandem Stance with Chair Support  - 1 x daily - 7 x weekly - 1 sets - 2 reps - 30 secs  hold - Standing Single Leg Heel Raise  - 1 x daily - 7 x weekly - 1 sets - 10 reps - 2 secs hold - Walking with Head Rotation  - 1 x daily - 7 x weekly - 1 sets - 10 reps   PATIENT EDUCATION: Education details: Medbridge HEP - see above Person educated: Patient and Spouse Education method: Explanation Education comprehension: verbalized understanding  HOME EXERCISE PROGRAM: Medbridge  GOALS: Goals reviewed with patient? Yes  SHORT TERM GOALS:  Target date: 11/19/2023  Pt will be independent with HEP for improved balance and gait. Baseline: Goal status: Not met - 1st PT appt 11-22-23  2.  Pt will improve 5x sit<>stand to less than or equal to 12 sec to demonstrate improved functional strength and transfer efficiency. Baseline: 13.41 sec; 11-22-23 - 10.22 secs without UE support from chair Goal status: Goal met 11-22-23   LONG TERM GOALS: Target date: 12/17/2023  Pt will be independent with HEP for improved balance, gait. Baseline:  Goal status: INITIAL  2.  Pt will improve FGA score to at least 23/30 to decrease fall risk. Baseline: 16/30 Goal status: INITIAL  3.  Pt will improve gait velocity to at least 2.62 ft/sec for improved gait efficiency and safety. Baseline: 2.44 ft/sec Goal status: INITIAL  4.  Pt will verbalize understanding of fall prevention in home environment. Baseline:  Goal status: INITIAL  5.  Pt will verbalize plans for continued community fitness upon d/c from PT, to maximize gains made in PT. Baseline:  Goal status: INITIAL#   ASSESSMENT:  CLINICAL IMPRESSION: Pt seen for OP PT at this facility after transferring from [facility] due to this site being closer location from his home.  Today is 1st treatment session since initial eval on 11-05-23.  STG #1 not met as HEP was just initiated in today's session.  STG #2 met as 5x sit to stand score = 10.22 secs.  Pt's balance impairments appear to be due to his visual deficits as his static standing balance is WFL's with dynamic standing balance and gait impaired; pt needed cues  to attend to bean bag on far Lt side on floor when performing kicking activity.  Pt is using cane for assistance with community ambulation due to visual deficits.  Cont with POC.  OBJECTIVE IMPAIRMENTS: Abnormal gait, decreased balance, decreased mobility, difficulty walking, and decreased strength.   ACTIVITY LIMITATIONS: transfers and locomotion level  PARTICIPATION  LIMITATIONS: driving, community activity, and fitness/leisure activities  PERSONAL FACTORS: 3+ comorbidities: PMH above are also affecting patient's functional outcome.   REHAB POTENTIAL: Good  CLINICAL DECISION MAKING: Stable/uncomplicated  EVALUATION COMPLEXITY: Low  PLAN:  PT FREQUENCY: 2x/week (decreased to 1x/week on 11-22-23)  PT DURATION: 6 weeks plus eval  PLANNED INTERVENTIONS: 97750- Physical Performance Testing, 97110-Therapeutic exercises, 97530- Therapeutic activity, [id number]- Neuromuscular re-education, [phone]- Self Care, 02883- Gait training, Patient/Family education, and Balance training  PLAN FOR NEXT SESSION: check HEP issued on 11-22-23; dynamic gait and balance - activities to incorporate visual scanning, esp. On Lt side   Adiva Boettner, [place], PT 11/22/2023, 6:54 PM  [facility] at [facility] [address] [place], [place] [address] Phone # [phone] Fax # [phone]  Referring diagnosis?  Z86.73 (ICD-10-CM) - History of stroke  H54.7 (ICD-10-CM) - Vision loss  Z92.89 (ICD-10-CM) - History of recent hospitalization   Treatment diagnosis? (if different than referring diagnosis) R 26.81, R 26.89, M62.81 What was this (referring dx) caused by? []  Surgery []  Fall []  Ongoing issue []  Arthritis [x]  Other: ____________  Laterality: []  Rt []  Lt [x]  Both  Check all possible CPT codes:  *CHOOSE 10 OR LESS*    See Planned Interventions listed in the Plan section of the Evaluation.

## 2023-11-24 ENCOUNTER — Encounter: Admitting: Occupational Therapy

## 2023-11-24 ENCOUNTER — Ambulatory Visit: Admitting: Physical Therapy

## 2023-11-25 ENCOUNTER — Encounter: Payer: Self-pay | Admitting: Occupational Therapy

## 2023-11-25 ENCOUNTER — Ambulatory Visit: Admitting: Occupational Therapy

## 2023-11-25 DIAGNOSIS — R2681 Unsteadiness on feet: Secondary | ICD-10-CM

## 2023-11-25 DIAGNOSIS — R2689 Other abnormalities of gait and mobility: Secondary | ICD-10-CM | POA: Diagnosis not present

## 2023-11-25 DIAGNOSIS — R41841 Cognitive communication deficit: Secondary | ICD-10-CM | POA: Diagnosis not present

## 2023-11-25 DIAGNOSIS — H53462 Homonymous bilateral field defects, left side: Secondary | ICD-10-CM | POA: Diagnosis not present

## 2023-11-25 DIAGNOSIS — I639 Cerebral infarction, unspecified: Secondary | ICD-10-CM | POA: Diagnosis not present

## 2023-11-25 DIAGNOSIS — M6281 Muscle weakness (generalized): Secondary | ICD-10-CM | POA: Diagnosis not present

## 2023-11-25 DIAGNOSIS — I69815 Cognitive social or emotional deficit following other cerebrovascular disease: Secondary | ICD-10-CM | POA: Diagnosis not present

## 2023-11-25 DIAGNOSIS — H5410 Blindness, one eye, low vision other eye, unspecified eyes: Secondary | ICD-10-CM | POA: Diagnosis not present

## 2023-11-25 DIAGNOSIS — R41842 Visuospatial deficit: Secondary | ICD-10-CM

## 2023-11-25 NOTE — Therapy (Signed)
 OUTPATIENT OCCUPATIONAL THERAPY NEURO TREATMENT  Patient Name: Terry Hays MRN: [id number] DOB:[birth year], 73 y.o., male Today's Date: 11/25/2023  PCP: [name], MD REFERRING PROVIDER: Bulah Alm RAMAN, PA-C  END OF SESSION:  OT End of Session - 11/25/23 0806     Visit Number 3    Number of Visits 17    Date for OT Re-Evaluation 01/05/24    Authorization Type [employer] Medicare Choice PPO    OT Start Time 0803    OT Stop Time 0845    OT Time Calculation (min) 42 min    Activity Tolerance Patient tolerated treatment well    Behavior During Therapy [facility] for tasks assessed/performed           Past Medical History:  Diagnosis Date   Abnormal ECG    Allergy    Anxiety    FLYING   Diverticulosis    Dyslipidemia    History of prostate cancer    Hyperlipidemia    Hypertension    LVH (left ventricular hypertrophy)    Pre-diabetes    Past Surgical History:  Procedure Laterality Date   COLONOSCOPY  2004 2011   MAGOD   EYE SURGERY  03/10/1999   CATOACTS BOTH   HERNIA REPAIR  03/09/1952   R INGHINAL   INGUINAL HERNIA REPAIR Left 04/07/2018   Procedure: OPERN LEFT INGUINAL HERNIA REPAIR WITH MESH ERAS PATHWAY;  Surgeon: Tanda Locus, MD;  Location: [facility];  Service: General;  Laterality: Left;   LOOP RECORDER INSERTION N/A 09/17/2023   Procedure: LOOP RECORDER INSERTION;  Surgeon: Lesia Ozell Barter, PA-C;  Location: [facility] INVASIVE CV LAB;  Service: Cardiovascular;  Laterality: N/A;   PENILE PROSTHESIS IMPLANT  03/09/2009   DAHLSTEDT   PROSTATE SURGERY  03/09/2001   PROSTRATECTOMY   Patient Active Problem List   Diagnosis Date Noted   History of CVA (cerebrovascular accident) 11/06/2023   Cognitive impairment 09/12/2023   Right lower lobe pulmonary nodule 09/08/2023   Attention deficit hyperactivity disorder (ADHD), predominantly hyperactive type 06/03/2021   Cherry angioma 06/03/2021   Seborrheic keratosis 06/03/2021   Arthritis 03/17/2018   Spinal  stenosis of lumbosacral region 03/17/2018   Actinic keratosis 03/17/2018   Degeneration of lumbar intervertebral disc 10/27/2017   History of prostate cancer 09/15/2011   Fear of flying 09/15/2011   Family history of heart disease in male family member before age 73 09/15/2011   Allergic rhinitis 09/15/2011   Hemorrhoids 09/15/2011   Hypertension 06/27/2010   Dyslipidemia    LVH (left ventricular hypertrophy)     ONSET DATE: late June 2025  REFERRING DIAG: Z86.73, Z92.89, H54.7  THERAPY DIAG:  Visuospatial deficit  Unsteadiness on feet  Muscle weakness (generalized)  Rationale for Evaluation and Treatment: Rehabilitation  SUBJECTIVE:   SUBJECTIVE STATEMENT: Pt reports 1 fall approx a month ago.  Pt reports that he can't see.   Pt reports that he had neuro ophthalmologist last week, but that they said eyes haven't gotten any better.  My ultimate goal is to drive  Pt accompanied by: self and friend  PERTINENT HISTORY: CVA 09/08/23, hx of falls  PRECAUTIONS: None  WEIGHT BEARING RESTRICTIONS: No  PAIN:  Are you having pain? No  FALLS: Has patient fallen in last 6 months? Yes. Number of falls unclear, but fell a couple weeks ago  LIVING ENVIRONMENT: Lives with: lives with their spouse Lives in: House/apartment Stairs: Yes: Internal: unclear but has a rail for internal steps steps; steps to enter home with rail  and rail doing downstairs Has following equipment at home: Single point cane  PLOF: Independent, Independent with basic ADLs, Independent with household mobility without device, and Independent with transfers  PATIENT GOALS: Pt would like to be able use his computer again for email, typing in word, etc. Would also love to be able to golf, play pickle ball etc.   OBJECTIVE:  Note: Objective measures were completed at Evaluation unless otherwise noted.  HAND DOMINANCE: Left  ADLs: Overall ADL: Mod I at this time, using a SPC for mobility.    IADLs: Overall pt is not driving and has assist for grocery shopping, bills, cooking, IADLs at this time.  Handwriting: 90% legible  MOBILITY STATUS: Hx of falls and needs SPC  POSTURE COMMENTS:  rounded shoulders Sitting balance: decresaed core strength noted but overall demonstrates functional sitting balance.   ACTIVITY TOLERANCE: Activity tolerance: decreased but functional for ADL tasks, not able to do IADL tasks such as golf and pickleball  FUNCTIONAL OUTCOME MEASURES: NA - pt truly does not feel like he has LUE deficits from CVA and wants to work on vision  UPPER EXTREMITY ROM:    Active ROM Right eval Left eval  Shoulder flexion    Shoulder abduction    Shoulder adduction    Shoulder extension    Shoulder internal rotation    Shoulder external rotation    Elbow flexion    Elbow extension    Wrist flexion    Wrist extension    Wrist ulnar deviation    Wrist radial deviation    Wrist pronation    Wrist supination    (Blank rows = not tested)  UPPER EXTREMITY MMT:     MMT Right eval Left eval  Shoulder flexion 150 WFL  Shoulder abduction    Shoulder adduction    Shoulder extension    Shoulder internal rotation    Shoulder external rotation    Middle trapezius    Lower trapezius    Elbow flexion    Elbow extension    Wrist flexion    Wrist extension    Wrist ulnar deviation    Wrist radial deviation    Wrist pronation    Wrist supination    (Blank rows = not tested)  Note mild deficits in B shoulders for shoulder flexion 2/2 old injuries from falls   HAND FUNCTION: WFL - note pt verbose and arrived late to evaluation, did not have time to asses grip strength as pt wanted to focus on vision. Will plan to assess in future sessions.   COORDINATION: Finger Nose Finger test: dysmetria and decreased coordination noted but unclear if this is from visual deficits to aim for other hand  11/25/23: 9 hole peg test: RT = 49.58 sec (slower d/t missing far  Lt side and difficulty following directions even after multiple cues) Lt = 44.70 (difficulty following directions)   SENSATION: WFL  EDEMA: none  MUSCLE TONE: RUE: Within functional limits and LUE: Within functional limits  COGNITION: Overall cognitive status: Impaired and Difficulty to assess due to: Note pt does not think deficits are significant but statements from S.O. in room states that cognition has been limited for comprehension, short term memory, and higher level tasks.   VISION: Subjective report: Pt reports he cannot see anything on the left - like its not even there. States he has learned various scanning and compensatory techniques.  Baseline vision: no glassess but demonstrates hemianopsia  Visual history: none prior to CVA  VISION ASSESSMENT: Impaired To be further assessed in functional context Eye alignment: Impaired:   Tracking/Visual pursuits: Decreased smoothness with horizontal tracking, Decreased smoothness with vertical tracking, Decreased smoothness of eye movement to Left superior field, Decreased smoothness of eye movement to Left inferior field, and Requires cues, head turns, or add eye shifts to track Visual Fields: Left homonymous hemianopsia  Patient has difficulty with following activities due to following visual impairments: driving, cooking, pickleball, golfing, has ad recent falls.   PERCEPTION: Impaired: Inattention/neglect: does not attend to left visual field  PRAXIS: Not tested  OBSERVATIONS: The pt is a 73 yo male who is accompanied by his S.O. this date walking with a SPC who demonstrates dignificant visual deficits post CVA. Pt demonstrates slight deficits in BUE shoulder ROM but is fairly symmetrical and good MMT - does not feel like strength has been impacted from CVA from a neuro stand point. Pt with significant visual field deficits, L hemianopsia, and decreased attention to L visual field impacting skill performance.                                                                                                                              TREATMENT DATE:  11/25/23:    Extensively reviewed visual compensation strategies and activities to encourage visual scanning at home (from last session)   Assessed coordination with 9 hole peg test (see above) - pt with difficulty following directions and scanning to Lt side even with multiple cues   Pt issued coordination HEP - see pt instructions for details. Pt return demo of each  Pt shown options for reading including recommendations of darker paper for line guide, and pt was also shown reading guide strips and provided handouts on where to purchase   PATIENT EDUCATION: Education details: see above Person educated: Patient and friend Education method: Programmer, multimedia, Facilities manager, Verbal cues, and Handouts Education comprehension: verbalized understanding, returned demonstration, verbal cues required, and needs further education  HOME EXERCISE PROGRAM: 11/22/23:  Visual HEP and compensation strategies  11/25/23: coordination HEP    GOALS: Goals reviewed with patient? Yes  SHORT TERM GOALS: Target date: 12/08/23  Pt will be Supervision with vision specific HEP to further integrate strategies and scanning into daily life for max recovery and benefit.  Baseline: Pt does incooperate techniques from hospital but would benefit from further training  Goal status: Ongoing 11/22/23, educated, but would benefit from review  2.  Pt will demonstrate improved visual scanning to locate 5 items in a room with no more than 2 verbal cues to locate items.  Baseline:  5 items, needing 3+ cues for scanning Goal status: INITIAL  3.  Pt will demonstrate improved visual acuity and/or use of adaptations to use his personal computer for leisure activities such as email, typing in Word, etc with no more than Supervision.  Baseline: Pt reports significant difficulty with this Goal status: INITIAL  4.   Pt will perform a visual scanning  task/worksheet of choice with no more than 2 errors in order to demonstrate improved scanning and acuity.  Baseline:  Goal status: INITIAL    LONG TERM GOALS: Target date: 01/05/24  Pt will be MOD I with vision specific HEP to further integrate strategies and scanning into daily life for max recovery and benefit.  Baseline: Pt does incooperate techniques from hospital but would benefit from further training  Goal status: INITIAL  2.  Pt will demonstrate improved visual scanning to locate 5 items in a room with no verbal cues to locate items.  Baseline:  5 items, needing 3+ cues for scanning Goal status: INITIAL  3.  Pt will demonstrate improved visual acuity and/or use of adaptations to use his personal computer for leisure activities such as email, typing in Word, etc with Independence. Baseline: Pt reports significant difficulty with this Goal status: INITIAL  4.  If safe and appropriate, pt will report a return to some leisure activities such as modified golf, pickle ball, walking, etc with use of strategies, AE, or compensatory techniques to improve safety and participation.  Baseline: not safe and not performing at this time Goal status: INITIAL    ASSESSMENT:  CLINICAL IMPRESSION: Pt with difficulty following directions for 9 hole peg test this date and max cues required to look far Lt of pegboard to fill in. Pt continues to benefit from reinforcement of visual strategies.   PERFORMANCE DEFICITS: in functional skills including ADLs, IADLs, proprioception, endurance, and vision, cognitive skills including attention, memory, problem solving, safety awareness, and sequencing, and psychosocial skills including coping strategies, environmental adaptation, habits, and routines and behaviors.   IMPAIRMENTS: are limiting patient from ADLs, IADLs, leisure, and social participation.   CO-MORBIDITIES: has no other co-morbidities that affects  occupational performance. Patient will benefit from skilled OT to address above impairments and improve overall function.  MODIFICATION OR ASSISTANCE TO COMPLETE EVALUATION: Min-Moderate modification of tasks or assist with assess necessary to complete an evaluation.  OT OCCUPATIONAL PROFILE AND HISTORY: Detailed assessment: Review of records and additional review of physical, cognitive, psychosocial history related to current functional performance.  CLINICAL DECISION MAKING: Moderate - several treatment options, min-mod task modification necessary  REHAB POTENTIAL: Good  EVALUATION COMPLEXITY: Moderate    PLAN:  OT FREQUENCY: 2x/week  OT DURATION: 8 weeks  PLANNED INTERVENTIONS: 97168 OT Re-evaluation, 97535 self care/ADL training, 02889 therapeutic exercise, 97530 therapeutic activity, and 97112 neuromuscular re-education  RECOMMENDED OTHER SERVICES: NA  CONSULTED AND AGREED WITH PLAN OF CARE: Patient and family member/caregiver  PLAN FOR NEXT SESSION:  review visual compensation strategies, environmental scanning, tabletop scanning activities  Burnard JINNY Roads, OTR/L 11/25/2023, 8:06 AM

## 2023-11-25 NOTE — Patient Instructions (Signed)
  Coordination Activities  Perform the following activities for 15 minutes 1-2 times per day with both hand(s).  Rotate ball in fingertips (clockwise and counter-clockwise).  Toss ball in air and catch with the same hand.  Deal cards with your thumb (Hold deck in hand and push card off top with thumb).  Rotate 1 card in hand (clockwise and counter-clockwise).  Pick up coins one at a time until you get 5 in your hand, then move coins from palm to fingertips to stack one at a time.

## 2023-11-26 NOTE — Progress Notes (Signed)
 Remote Loop Recorder Transmission

## 2023-11-28 ENCOUNTER — Ambulatory Visit: Payer: Self-pay | Admitting: Cardiology

## 2023-11-28 NOTE — Therapy (Signed)
 OUTPATIENT OCCUPATIONAL THERAPY NEURO TREATMENT  Patient Name: Terry Hays MRN: [id number] DOB:February 28, 1951, 73 y.o., male Today's Date: 11/29/2023  PCP: [name], MD REFERRING PROVIDER: Bulah Alm RAMAN, PA-C  END OF SESSION:  OT End of Session - 11/29/23 0848     Visit Number 4    Number of Visits 17    Date for Recertification  01/05/24    Authorization Type [employer] PPO    OT Start Time 0932    OT Stop Time 1013    OT Time Calculation (min) 41 min    Activity Tolerance Patient tolerated treatment well    Behavior During Therapy [facility] for tasks assessed/performed            Past Medical History:  Diagnosis Date   Abnormal ECG    Allergy    Anxiety    FLYING   Diverticulosis    Dyslipidemia    History of prostate cancer    Hyperlipidemia    Hypertension    LVH (left ventricular hypertrophy)    Pre-diabetes    Past Surgical History:  Procedure Laterality Date   COLONOSCOPY  2004 2011   MAGOD   EYE SURGERY  03/10/1999   CATOACTS BOTH   HERNIA REPAIR  03/09/1952   R INGHINAL   INGUINAL HERNIA REPAIR Left 04/07/2018   Procedure: OPERN LEFT INGUINAL HERNIA REPAIR WITH MESH ERAS PATHWAY;  Surgeon: Tanda Locus, MD;  Location: [facility];  Service: General;  Laterality: Left;   LOOP RECORDER INSERTION N/A 09/17/2023   Procedure: LOOP RECORDER INSERTION;  Surgeon: Lesia Ozell Barter, PA-C;  Location: [facility] INVASIVE CV LAB;  Service: Cardiovascular;  Laterality: N/A;   PENILE PROSTHESIS IMPLANT  03/09/2009   DAHLSTEDT   PROSTATE SURGERY  03/09/2001   PROSTRATECTOMY   Patient Active Problem List   Diagnosis Date Noted   History of CVA (cerebrovascular accident) 11/06/2023   Cognitive impairment 09/12/2023   Right lower lobe pulmonary nodule 09/08/2023   Attention deficit hyperactivity disorder (ADHD), predominantly hyperactive type 06/03/2021   Cherry angioma 06/03/2021   Seborrheic keratosis 06/03/2021   Arthritis 03/17/2018   Spinal  stenosis of lumbosacral region 03/17/2018   Actinic keratosis 03/17/2018   Degeneration of lumbar intervertebral disc 10/27/2017   History of prostate cancer 09/15/2011   Fear of flying 09/15/2011   Family history of heart disease in male family member before age 1 09/15/2011   Allergic rhinitis 09/15/2011   Hemorrhoids 09/15/2011   Hypertension 06/27/2010   Dyslipidemia    LVH (left ventricular hypertrophy)     ONSET DATE: late June 2025  REFERRING DIAG: Z86.73, Z92.89, H54.7  THERAPY DIAG:  Visuospatial deficit  Unsteadiness on feet  Rationale for Evaluation and Treatment: Rehabilitation  SUBJECTIVE:   SUBJECTIVE STATEMENT: Pt reports that he can't see any better.  Pt reports that he has done scavenger hunt at home with grandchildren/wife   Pt accompanied by: self and friend  PERTINENT HISTORY: CVA 09/08/23, hx of falls  PRECAUTIONS: None  WEIGHT BEARING RESTRICTIONS: No  PAIN:  Are you having pain? No  FALLS: Has patient fallen in last 6 months? Yes. Number of falls unclear, but fell a couple weeks ago  LIVING ENVIRONMENT: Lives with: lives with their spouse Lives in: House/apartment Stairs: Yes: Internal: unclear but has a rail for internal steps steps; steps to enter home with rail and rail doing downstairs Has following equipment at home: Single point cane  PLOF: Independent, Independent with basic ADLs, Independent with household mobility  without device, and Independent with transfers  PATIENT GOALS: Pt would like to be able use his computer again for email, typing in word, etc. Would also love to be able to golf, play pickle ball etc. Ultimate goal is to drive.  OBJECTIVE:  Note: Objective measures were completed at Evaluation unless otherwise noted.  HAND DOMINANCE: Left  ADLs: Overall ADL: Mod I at this time, using a SPC for mobility.   IADLs: Overall pt is not driving and has assist for grocery shopping, bills, cooking, IADLs at this time.   Handwriting: 90% legible  MOBILITY STATUS: Hx of falls and needs SPC  POSTURE COMMENTS:  rounded shoulders Sitting balance: decresaed core strength noted but overall demonstrates functional sitting balance.   ACTIVITY TOLERANCE: Activity tolerance: decreased but functional for ADL tasks, not able to do IADL tasks such as golf and pickleball  FUNCTIONAL OUTCOME MEASURES: NA - pt truly does not feel like he has LUE deficits from CVA and wants to work on vision  UPPER EXTREMITY ROM:    Active ROM Right eval Left eval  Shoulder flexion    Shoulder abduction    Shoulder adduction    Shoulder extension    Shoulder internal rotation    Shoulder external rotation    Elbow flexion    Elbow extension    Wrist flexion    Wrist extension    Wrist ulnar deviation    Wrist radial deviation    Wrist pronation    Wrist supination    (Blank rows = not tested)  UPPER EXTREMITY MMT:     MMT Right eval Left eval  Shoulder flexion 150 WFL  Shoulder abduction    Shoulder adduction    Shoulder extension    Shoulder internal rotation    Shoulder external rotation    Middle trapezius    Lower trapezius    Elbow flexion    Elbow extension    Wrist flexion    Wrist extension    Wrist ulnar deviation    Wrist radial deviation    Wrist pronation    Wrist supination    (Blank rows = not tested)  Note mild deficits in B shoulders for shoulder flexion 2/2 old injuries from falls   HAND FUNCTION: WFL - note pt verbose and arrived late to evaluation, did not have time to asses grip strength as pt wanted to focus on vision. Will plan to assess in future sessions.   COORDINATION: Finger Nose Finger test: dysmetria and decreased coordination noted but unclear if this is from visual deficits to aim for other hand  11/25/23: 9 hole peg test: RT = 49.58 sec (slower d/t missing far Lt side and difficulty following directions even after multiple cues) Lt = 44.70 (difficulty following  directions)   SENSATION: WFL  EDEMA: none  MUSCLE TONE: RUE: Within functional limits and LUE: Within functional limits  COGNITION: Overall cognitive status: Impaired and Difficulty to assess due to: Note pt does not think deficits are significant but statements from S.O. in room states that cognition has been limited for comprehension, short term memory, and higher level tasks.   VISION: Subjective report: Pt reports he cannot see anything on the left - like its not even there. States he has learned various scanning and compensatory techniques.  Baseline vision: no glassess but demonstrates hemianopsia  Visual history: none prior to CVA   VISION ASSESSMENT: Impaired To be further assessed in functional context Eye alignment: Impaired:   Tracking/Visual pursuits: Decreased smoothness  with horizontal tracking, Decreased smoothness with vertical tracking, Decreased smoothness of eye movement to Left superior field, Decreased smoothness of eye movement to Left inferior field, and Requires cues, head turns, or add eye shifts to track Visual Fields: Left homonymous hemianopsia  Patient has difficulty with following activities due to following visual impairments: driving, cooking, pickleball, golfing, has ad recent falls.   PERCEPTION: Impaired: Inattention/neglect: does not attend to left visual field  PRAXIS: Not tested  OBSERVATIONS: The pt is a 73 yo male who is accompanied by his S.O. this date walking with a SPC who demonstrates dignificant visual deficits post CVA. Pt demonstrates slight deficits in BUE shoulder ROM but is fairly symmetrical and good MMT - does not feel like strength has been impacted from CVA from a neuro stand point. Pt with significant visual field deficits, L hemianopsia, and decreased attention to L visual field impacting skill performance.                                                                                                                              TREATMENT DATE:   11/29/23:   Completing 12 piece puzzle with mod cueing initially for review and use of compensation strategies, and for problem solving then min cueing.    Placing small pegs in pegboard to copy design for incr coordination and visual scanning/perception with mod cueing, particularly for L side omissions initially and visual scanning.  .Environmental Scanning/navigation (ambulating) in min distracting environment with 10/15 accuracy = 67% on first pass. Pt able to locate remaining items on second pass with min cueing.  Discussed errors and need to perform head turns and double check himself to compensate for visual deficits.   PATIENT EDUCATION: Education details: see above (reviewed strategies:  looking for edge of objects, head turns, double-check himself, initial scan before moving/activity, organized scanning pattern) Person educated: Patient and friend Education method: Explanation, Demonstration, Verbal cues, and Handouts Education comprehension: verbalized understanding, returned demonstration, verbal cues required, and needs further education  HOME EXERCISE PROGRAM: 11/22/23:  Visual HEP and compensation strategies  11/25/23: coordination HEP    GOALS: Goals reviewed with patient? Yes  SHORT TERM GOALS: Target date: 12/08/23  Pt will be Supervision with vision specific HEP to further integrate strategies and scanning into daily life for max recovery and benefit.  Baseline: Pt does incooperate techniques from hospital but would benefit from further training  Goal status: Ongoing 11/22/23, educated, but would benefit from review  2.  Pt will demonstrate improved visual scanning to locate 5 items in a room with no more than 2 verbal cues to locate items.  Baseline:  5 items, needing 3+ cues for scanning Goal status: INITIAL  3.  Pt will demonstrate improved visual acuity and/or use of adaptations to use his personal computer for leisure activities such as email,  typing in Word, etc with no more than Supervision.  Baseline: Pt reports significant difficulty with this Goal status: INITIAL  4.  Pt will perform a visual scanning task/worksheet of choice with no more than 2 errors in order to demonstrate improved scanning and acuity.  Baseline:  Goal status: INITIAL    LONG TERM GOALS: Target date: 01/05/24  Pt will be MOD I with vision specific HEP to further integrate strategies and scanning into daily life for max recovery and benefit.  Baseline: Pt does incooperate techniques from hospital but would benefit from further training  Goal status: INITIAL  2.  Pt will demonstrate improved visual scanning to locate 5 items in a room with no verbal cues to locate items.  Baseline:  5 items, needing 3+ cues for scanning Goal status: INITIAL  3.  Pt will demonstrate improved visual acuity and/or use of adaptations to use his personal computer for leisure activities such as email, typing in Word, etc with Independence. Baseline: Pt reports significant difficulty with this Goal status: INITIAL  4.  If safe and appropriate, pt will report a return to some leisure activities such as modified golf, pickle ball, walking, etc with use of strategies, AE, or compensatory techniques to improve safety and participation.  Baseline: not safe and not performing at this time Goal status: INITIAL    ASSESSMENT:  CLINICAL IMPRESSION: Pt is progressing towards goals with improved use visual compensation strategies, but continues to need cueing initially and for consistency.  Pt completed 12 piece puzzle with incr ease today.     PERFORMANCE DEFICITS: in functional skills including ADLs, IADLs, proprioception, endurance, and vision, cognitive skills including attention, memory, problem solving, safety awareness, and sequencing, and psychosocial skills including coping strategies, environmental adaptation, habits, and routines and behaviors.   IMPAIRMENTS: are  limiting patient from ADLs, IADLs, leisure, and social participation.   CO-MORBIDITIES: has no other co-morbidities that affects occupational performance. Patient will benefit from skilled OT to address above impairments and improve overall function.  MODIFICATION OR ASSISTANCE TO COMPLETE EVALUATION: Min-Moderate modification of tasks or assist with assess necessary to complete an evaluation.  OT OCCUPATIONAL PROFILE AND HISTORY: Detailed assessment: Review of records and additional review of physical, cognitive, psychosocial history related to current functional performance.  CLINICAL DECISION MAKING: Moderate - several treatment options, min-mod task modification necessary  REHAB POTENTIAL: Good  EVALUATION COMPLEXITY: Moderate    PLAN:  OT FREQUENCY: 2x/week  OT DURATION: 8 weeks  PLANNED INTERVENTIONS: 97168 OT Re-evaluation, 97535 self care/ADL training, 02889 therapeutic exercise, 97530 therapeutic activity, and 97112 neuromuscular re-education  RECOMMENDED OTHER SERVICES: NA  CONSULTED AND AGREED WITH PLAN OF CARE: Patient and family member/caregiver  PLAN FOR NEXT SESSION:  review and practice visual compensation strategies, environmental scanning, tabletop scanning activities  Daneya Hartgrove, OTR/L 11/29/2023, 11:10 AM

## 2023-11-29 ENCOUNTER — Encounter: Admitting: Occupational Therapy

## 2023-11-29 ENCOUNTER — Encounter: Payer: Self-pay | Admitting: Occupational Therapy

## 2023-11-29 ENCOUNTER — Ambulatory Visit: Admitting: Physical Therapy

## 2023-11-29 ENCOUNTER — Ambulatory Visit: Admitting: Occupational Therapy

## 2023-11-29 ENCOUNTER — Encounter: Payer: Self-pay | Admitting: Physical Therapy

## 2023-11-29 VITALS — BP 118/60 | HR 57

## 2023-11-29 DIAGNOSIS — R2681 Unsteadiness on feet: Secondary | ICD-10-CM | POA: Diagnosis not present

## 2023-11-29 DIAGNOSIS — I639 Cerebral infarction, unspecified: Secondary | ICD-10-CM | POA: Diagnosis not present

## 2023-11-29 DIAGNOSIS — R2689 Other abnormalities of gait and mobility: Secondary | ICD-10-CM | POA: Diagnosis not present

## 2023-11-29 DIAGNOSIS — M6281 Muscle weakness (generalized): Secondary | ICD-10-CM | POA: Diagnosis not present

## 2023-11-29 DIAGNOSIS — R41842 Visuospatial deficit: Secondary | ICD-10-CM

## 2023-11-29 DIAGNOSIS — H53462 Homonymous bilateral field defects, left side: Secondary | ICD-10-CM | POA: Diagnosis not present

## 2023-11-29 DIAGNOSIS — R41841 Cognitive communication deficit: Secondary | ICD-10-CM | POA: Diagnosis not present

## 2023-11-29 DIAGNOSIS — I69815 Cognitive social or emotional deficit following other cerebrovascular disease: Secondary | ICD-10-CM | POA: Diagnosis not present

## 2023-11-29 DIAGNOSIS — H5410 Blindness, one eye, low vision other eye, unspecified eyes: Secondary | ICD-10-CM | POA: Diagnosis not present

## 2023-11-29 NOTE — Therapy (Signed)
 OUTPATIENT PHYSICAL THERAPY NEURO TREATMENT NOTE   Patient Name: Terry Hays MRN: [id number] DOB:[birth year], 73 y.o., male Today's Date: 11/29/2023   PCP: [name], MD  REFERRING PROVIDER: Bulah Alm RAMAN, PA-C   END OF SESSION:  PT End of Session - 11/29/23 0849     Visit Number 3    Number of Visits 13    Date for Recertification  12/17/23    Authorization Type [employer] Medicare-submitted auth at eval    Authorization Time Period 8-29 - 12-17-23    Authorization - Number of Visits 13    Progress Note Due on Visit 10    PT Start Time 0848    PT Stop Time 0928    PT Time Calculation (min) 40 min    Equipment Utilized During Treatment Gait belt    Activity Tolerance Patient tolerated treatment well    Behavior During Therapy [facility] for tasks assessed/performed           Past Medical History:  Diagnosis Date   Abnormal ECG    Allergy    Anxiety    FLYING   Diverticulosis    Dyslipidemia    History of prostate cancer    Hyperlipidemia    Hypertension    LVH (left ventricular hypertrophy)    Pre-diabetes    Past Surgical History:  Procedure Laterality Date   COLONOSCOPY  2004 2011   MAGOD   EYE SURGERY  03/10/1999   CATOACTS BOTH   HERNIA REPAIR  03/09/1952   R INGHINAL   INGUINAL HERNIA REPAIR Left 04/07/2018   Procedure: OPERN LEFT INGUINAL HERNIA REPAIR WITH MESH ERAS PATHWAY;  Surgeon: Tanda Locus, MD;  Location: [facility];  Service: General;  Laterality: Left;   LOOP RECORDER INSERTION N/A 09/17/2023   Procedure: LOOP RECORDER INSERTION;  Surgeon: Lesia Ozell Barter, PA-C;  [facility] INVASIVE CV LAB;  Service: Cardiovascular;  Laterality: N/A;   PENILE PROSTHESIS IMPLANT  03/09/2009   DAHLSTEDT   PROSTATE SURGERY  03/09/2001   PROSTRATECTOMY   Patient Active Problem List   Diagnosis Date Noted   History of CVA (cerebrovascular accident) 11/06/2023   Cognitive impairment 09/12/2023   Right lower lobe pulmonary nodule 09/08/2023    Attention deficit hyperactivity disorder (ADHD), predominantly hyperactive type 06/03/2021   Cherry angioma 06/03/2021   Seborrheic keratosis 06/03/2021   Arthritis 03/17/2018   Spinal stenosis of lumbosacral region 03/17/2018   Actinic keratosis 03/17/2018   Degeneration of lumbar intervertebral disc 10/27/2017   History of prostate cancer 09/15/2011   Fear of flying 09/15/2011   Family history of heart disease in male family member before age 6 09/15/2011   Allergic rhinitis 09/15/2011   Hemorrhoids 09/15/2011   Hypertension 06/27/2010   Dyslipidemia    LVH (left ventricular hypertrophy)     ONSET DATE: 10/20/2023 (MD referral)  REFERRING DIAG:  Z86.73 (ICD-10-CM) - History of stroke  H54.7 (ICD-10-CM) - Vision loss  Z92.89 (ICD-10-CM) - History of recent hospitalization    THERAPY DIAG:  Unsteadiness on feet  Muscle weakness (generalized)  Other abnormalities of gait and mobility  Rationale for Evaluation and Treatment: Rehabilitation  SUBJECTIVE:  SUBJECTIVE STATEMENT:  Played some golf, really had to focus on the ball. Has been practicing putting and chipping. Walked 9 holes and played a little golf.  No falls, but stumbled this morning on a brick. No questions about his HEP.   Pt accompanied by: self and Rick  PERTINENT HISTORY: PMH prior CVAs (unknown to patient), ADHD, HTN, HLD, prostate cancer, diverticulosis who presented to the ER with confusion and left visual field changes (09/2023)  PAIN:  Are you having pain? No  PRECAUTIONS: Fall and Other: decreased peripheral vision from CVA   Avoid driving  RED FLAGS: None   WEIGHT BEARING RESTRICTIONS: No  FALLS: Has patient fallen in last 6 months? Yes. Number of falls 1-R knee gave way  LIVING ENVIRONMENT: Lives with:  lives with their spouse Lives in: House/apartment Stairs: steps to enter with rails; upstairs and downstairs with rail Has following equipment at home: Single point cane  PLOF: Independent and Leisure: played pickleball and golf; was working out for core strengthening and walking several miles everyday  PATIENT GOALS: To get back to playing pickleball [facility]) and golf  OBJECTIVE:  Note: Objective measures were completed at Evaluation unless otherwise noted.  DIAGNOSTIC FINDINGS: acute CVA of the right posterior cerebral artery,   COGNITION: Overall cognitive status: Within functional limits for tasks assessed   SENSATION: Light touch: WFL  COORDINATION: WFL for bilat alt taps   MUSCLE TONE: WFL  POSTURE: rounded shoulders  LOWER EXTREMITY ROM:   WFL BLEs   LOWER EXTREMITY MMT:    MMT Right Eval Left Eval  Hip flexion 4 4+  Hip extension    Hip abduction 5 5  Hip adduction 5 5  Hip internal rotation    Hip external rotation    Knee flexion 5 5  Knee extension 4 4  Ankle dorsiflexion 4 4  Ankle plantarflexion    Ankle inversion    Ankle eversion    (Blank rows = not tested)  TRANSFERS: Sit to stand: Modified independence  Assistive device utilized: None     Stand to sit: Modified independence  Assistive device utilized: None      GAIT: Findings: Gait Characteristics: veers to R, step through pattern, and wide BOS, Distance walked: 50 ft, Assistive device utilized:Single point cane, and Level of assistance: SBA  FUNCTIONAL TESTS:  5 times sit to stand: 13.91 sec Timed up and go (TUG): NT 10 meter walk test: 13.41 veers to R= 2.44 ft/sec FGA= 16/30  M-CTSIB  Condition 1: Firm Surface, EO 30 Sec, Normal Sway  Condition 2: Firm Surface, EC 30 Sec, Normal Sway  Condition 3: Foam Surface, EO 30 Sec, Mild Sway  Condition 4: Foam Surface, EC 30 Sec, Mild and Moderate Sway  TREATMENT DATE: 11/29/23  Vitals:   11/29/23 0854  BP: 118/60  Pulse: (!) 57    NMR:  With 5 blaze pods (3 on first step, 2 on 2nd step) to work on weight shifting, visual scanning, SLS, foot clearance, reaction times. Performed on air ex for compliant surface, trying to perform with no UE support, CGA for balance  Performed 3 bouts of 1 minute each: 14 hits (taking incr time initially to find target on the L), 26 hits, 30 hits (alternating between R and L leg each time) Pt fatigued afterwards and needing a seated rest break   Standing at rebounder:  With feet apart > more narrow BOS tossing 1kg red ball with both hands and catching it, 20 reps Alternating forward stepping and tossing and catching ball 2 sets of 10 reps  CGA for balance, pt more challenged with more narrow BOS   On blue foam beam: Tandem gait down and back x4 reps, intermittent UE support for balance  With wide BOS, head turns to R/L and naming cards in multi-directions 2 x 10 reps each side, incr postural sway when turning head to the R   PATIENT EDUCATION: Education details: Continue HEP, purpose of exercises and what they are addressing  Person educated: Patient Education method: Explanation, Demonstration, and Verbal cues Education comprehension: verbalized understanding  HOME EXERCISE PROGRAM: Access Code: [email or web address] URL: [email or web address] Date: 11/22/2023 Prepared by: Rock Kussmaul   Exercises - Single Leg Stance with Support  - 1 x daily - 7 x weekly - 1 sets - 2-3 reps - 10 secs hold - Tandem Stance with Chair Support  - 1 x daily - 7 x weekly - 1 sets - 2 reps - 30 secs  hold - Standing Single Leg Heel Raise  - 1 x daily - 7 x weekly - 1 sets - 10 reps - 2 secs hold - Walking with Head Rotation  - 1 x daily - 7 x weekly - 1 sets - 10 reps  GOALS: Goals reviewed with patient? Yes  SHORT TERM GOALS: Target date: 11/19/2023  Pt will  be independent with HEP for improved balance and gait. Baseline: Goal status: Not met - 1st PT appt 11-22-23  2.  Pt will improve 5x sit<>stand to less than or equal to 12 sec to demonstrate improved functional strength and transfer efficiency. Baseline: 13.41 sec; 11-22-23 - 10.22 secs without UE support from chair Goal status: Goal met 11-22-23   LONG TERM GOALS: Target date: 12/17/2023  Pt will be independent with HEP for improved balance, gait. Baseline:  Goal status: INITIAL  2.  Pt will improve FGA score to at least 23/30 to decrease fall risk. Baseline: 16/30 Goal status: INITIAL  3.  Pt will improve gait velocity to at least 2.62 ft/sec for improved gait efficiency and safety. Baseline: 2.44 ft/sec Goal status: INITIAL  4.  Pt will verbalize understanding of fall prevention in home environment. Baseline:  Goal status: INITIAL  5.  Pt will verbalize plans for continued community fitness upon d/c from PT, to maximize gains made in PT. Baseline:  Goal status: INITIAL#   ASSESSMENT:  CLINICAL IMPRESSION: Today's skilled session focused on working on balance tasks on compliant surfaces with addition of visual scanning to incorporate L sided visual deficits. With blaze pods task, pt needing initial cues to attend to L side to find blaze pod, but did improve afterwards. Pt needing frequent seated rest break during session due to fatigue, mainly due to visual  deficits. Will continue per POC.   OBJECTIVE IMPAIRMENTS: Abnormal gait, decreased balance, decreased mobility, difficulty walking, and decreased strength.   ACTIVITY LIMITATIONS: transfers and locomotion level  PARTICIPATION LIMITATIONS: driving, community activity, and fitness/leisure activities  PERSONAL FACTORS: 3+ comorbidities: PMH above are also affecting patient's functional outcome.   REHAB POTENTIAL: Good  CLINICAL DECISION MAKING: Stable/uncomplicated  EVALUATION COMPLEXITY: Low  PLAN:  PT FREQUENCY:  2x/week (decreased to 1x/week on 11-22-23)  PT DURATION: 6 weeks plus eval  PLANNED INTERVENTIONS: 97750- Physical Performance Testing, 97110-Therapeutic exercises, 97530- Therapeutic activity, [id number]- Neuromuscular re-education, 97535- Self Care, 02883- Gait training, Patient/Family education, and Balance training  PLAN FOR NEXT SESSION: add to HEP for working on balance on unlevel surfaces   dynamic gait and balance - activities to incorporate visual scanning, esp. On Lt side   [place] LOISE Senate, PT, DPT 11/29/2023, 9:40 AM   Referring diagnosis?  Z86.73 (ICD-10-CM) - History of stroke  H54.7 (ICD-10-CM) - Vision loss  Z92.89 (ICD-10-CM) - History of recent hospitalization   Treatment diagnosis? (if different than referring diagnosis) R 26.81, R 26.89, M62.81 What was this (referring dx) caused by? []  Surgery []  Fall []  Ongoing issue []  Arthritis [x]  Other: ____________  Laterality: []  Rt []  Lt [x]  Both  Check all possible CPT codes:  *CHOOSE 10 OR LESS*    See Planned Interventions listed in the Plan section of the Evaluation.

## 2023-12-01 ENCOUNTER — Ambulatory Visit: Admitting: Physical Therapy

## 2023-12-02 ENCOUNTER — Encounter: Payer: Self-pay | Admitting: Occupational Therapy

## 2023-12-02 ENCOUNTER — Ambulatory Visit: Admitting: Occupational Therapy

## 2023-12-02 DIAGNOSIS — R41842 Visuospatial deficit: Secondary | ICD-10-CM

## 2023-12-02 DIAGNOSIS — R2689 Other abnormalities of gait and mobility: Secondary | ICD-10-CM | POA: Diagnosis not present

## 2023-12-02 DIAGNOSIS — R2681 Unsteadiness on feet: Secondary | ICD-10-CM | POA: Diagnosis not present

## 2023-12-02 DIAGNOSIS — H5410 Blindness, one eye, low vision other eye, unspecified eyes: Secondary | ICD-10-CM | POA: Diagnosis not present

## 2023-12-02 DIAGNOSIS — M6281 Muscle weakness (generalized): Secondary | ICD-10-CM

## 2023-12-02 DIAGNOSIS — H53462 Homonymous bilateral field defects, left side: Secondary | ICD-10-CM | POA: Diagnosis not present

## 2023-12-02 DIAGNOSIS — I69815 Cognitive social or emotional deficit following other cerebrovascular disease: Secondary | ICD-10-CM | POA: Diagnosis not present

## 2023-12-02 DIAGNOSIS — I639 Cerebral infarction, unspecified: Secondary | ICD-10-CM | POA: Diagnosis not present

## 2023-12-02 DIAGNOSIS — R41841 Cognitive communication deficit: Secondary | ICD-10-CM | POA: Diagnosis not present

## 2023-12-02 NOTE — Progress Notes (Signed)
 Remote Loop Recorder Transmission

## 2023-12-02 NOTE — Progress Notes (Incomplete)
 Cardiology Office Note:   Date:  12/02/2023  ID:  Terry Hays, DOB [birth year], MRN [id number] PCP: [name], MD  [facility] Providers Cardiologist:  Aleene Passe, MD (Inactive) { Chief Complaint: No chief complaint on file.     History of Present Illness:   [name] is a 73 y.o. male with a PMH of HTN, HLD, prior CVA who presents for follow up.  Last seen by Dr. Passe on 04/02/2022 for follow-up of HTN and HLD.  He was doing well at that time.   Past Medical History:  Diagnosis Date   Abnormal ECG    Allergy    Anxiety    FLYING   Diverticulosis    Dyslipidemia    History of prostate cancer    Hyperlipidemia    Hypertension    LVH (left ventricular hypertrophy)    Pre-diabetes      Studies Reviewed:    EKG: ***       Cardiac Studies & Procedures   ______________________________________________________________________________________________     ECHOCARDIOGRAM  ECHOCARDIOGRAM COMPLETE 09/09/2023  Narrative ECHOCARDIOGRAM REPORT    Patient Name:   Terry Hays [facility] Date of Exam: 09/09/2023 Medical Rec #:  [id number]         Height:       68.0 in Accession #:    [id number]        Weight:       183.0 lb Date of Birth:  22-Jul-1950         BSA:          1.968 m Patient Age:    73 years          BP:           127/50 mmHg Patient Gender: M                 HR:           69 bpm. Exam Location:  Inpatient  Procedure: 2D Echo, Cardiac Doppler, Color Doppler and Intracardiac Opacification Agent (Both Spectral and Color Flow Doppler were utilized during procedure).  Indications:    Stroke  History:        Patient has prior history of Echocardiogram examinations, most recent 10/03/2007. Risk Factors:Hypertension and Dyslipidemia.  Sonographer:    Therisa Crouch Referring Phys: [id number] VISHAL R PATEL  IMPRESSIONS   1. Left ventricular ejection fraction, by estimation, is 60 to 65%. The left ventricle has normal function. The left  ventricle has no regional wall motion abnormalities. There is moderate concentric left ventricular hypertrophy. Left ventricular diastolic parameters are indeterminate. 2. Right ventricular systolic function is normal. The right ventricular size is normal. There is normal pulmonary artery systolic pressure. 3. Left atrial size was moderately dilated. 4. The mitral valve is normal in structure. Trivial mitral valve regurgitation. No evidence of mitral stenosis. 5. The aortic valve is tricuspid. Aortic valve regurgitation is not visualized. No aortic stenosis is present. 6. The inferior vena cava is normal in size with greater than 50% respiratory variability, suggesting right atrial pressure of 3 mmHg.  Comparison(s): Prior images unable to be directly viewed, comparison made by report only.  Conclusion(s)/Recommendation(s): Normal biventricular function without evidence of hemodynamically significant valvular heart disease. No intracardiac source of embolism detected on this transthoracic study. Consider a transesophageal echocardiogram to exclude cardiac source of embolism if clinically indicated. No left ventricular mural or apical thrombus/thrombi.  FINDINGS Left Ventricle: Left ventricular ejection fraction, by estimation, is 60 to 65%.  The left ventricle has normal function. The left ventricle has no regional wall motion abnormalities. Definity  contrast agent was given IV to delineate the left ventricular endocardial borders. The left ventricular internal cavity size was normal in size. There is moderate concentric left ventricular hypertrophy. Left ventricular diastolic parameters are indeterminate.  Right Ventricle: The right ventricular size is normal. No increase in right ventricular wall thickness. Right ventricular systolic function is normal. There is normal pulmonary artery systolic pressure. The tricuspid regurgitant velocity is 2.47 m/s, and with an assumed right atrial pressure of 3  mmHg, the estimated right ventricular systolic pressure is 27.4 mmHg.  Left Atrium: Left atrial size was moderately dilated.  Right Atrium: Right atrial size was normal in size.  Pericardium: There is no evidence of pericardial effusion.  Mitral Valve: The mitral valve is normal in structure. Trivial mitral valve regurgitation. No evidence of mitral valve stenosis.  Tricuspid Valve: The tricuspid valve is normal in structure. Tricuspid valve regurgitation is trivial. No evidence of tricuspid stenosis.  Aortic Valve: The aortic valve is tricuspid. Aortic valve regurgitation is not visualized. No aortic stenosis is present.  Pulmonic Valve: The pulmonic valve was not well visualized. Pulmonic valve regurgitation is trivial. No evidence of pulmonic stenosis.  Aorta: The aortic root, ascending aorta and aortic arch are all structurally normal, with no evidence of dilitation or obstruction.  Venous: The inferior vena cava is normal in size with greater than 50% respiratory variability, suggesting right atrial pressure of 3 mmHg.  IAS/Shunts: The atrial septum is grossly normal.   LEFT VENTRICLE PLAX 2D LVIDd:         4.40 cm      Diastology LVIDs:         2.80 cm      LV e' medial:    7.29 cm/s LV PW:         1.61 cm      LV E/e' medial:  6.4 LV IVS:        1.50 cm      LV e' lateral:   9.03 cm/s LVOT diam:     2.20 cm      LV E/e' lateral: 5.2 LVOT Area:     3.80 cm  LV Volumes (MOD) LV vol d, MOD A2C: 86.6 ml LV vol d, MOD A4C: 122.0 ml LV vol s, MOD A2C: 34.0 ml LV vol s, MOD A4C: 47.1 ml LV SV MOD A2C:     52.6 ml LV SV MOD A4C:     122.0 ml LV SV MOD BP:      68.4 ml  RIGHT VENTRICLE             IVC RV Basal diam:  2.50 cm     IVC diam: 1.70 cm RV S prime:     16.80 cm/s TAPSE (M-mode): 1.9 cm  LEFT ATRIUM             Index LA diam:        4.60 cm 2.34 cm/m LA Vol (A2C):   74.2 ml 37.70 ml/m LA Vol (A4C):   89.0 ml 45.22 ml/m LA Biplane Vol: 82.8 ml 42.07  ml/m  AORTA Ao Root diam: 3.70 cm Ao Asc diam:  3.00 cm  MITRAL VALVE               TRICUSPID VALVE MV Area (PHT): 4.54 cm    TR Peak grad:   24.4 mmHg MV Decel Time: 167 msec    TR  Vmax:        247.00 cm/s MV E velocity: 46.70 cm/s MV A velocity: 49.30 cm/s  SHUNTS MV E/A ratio:  0.95        Systemic Diam: 2.20 cm  Shelda Bruckner MD Electronically signed by Shelda Bruckner MD Signature Date/Time: 09/09/2023/2:02:15 PM    Final          ______________________________________________________________________________________________      Risk Assessment/Calculations:   {Does this patient have ATRIAL FIBRILLATION?[phone]} No BP recorded.  {Refresh Note OR Click here to enter BP  :1}***        Physical Exam:     VS:  There were no vitals taken for this visit. ***    Wt Readings from Last 3 Encounters:  11/09/23 191 lb 6.4 oz (86.8 kg)  10/04/23 186 lb 12.8 oz (84.7 kg)  09/08/23 183 lb (83 kg)     GEN: Well nourished, well developed, in no acute distress NECK: No JVD; No carotid bruits CARDIAC: ***RRR, no murmurs, rubs, gallops RESPIRATORY:  Clear to auscultation without rales, wheezing or rhonchi  ABDOMEN: Soft, non-tender, non-distended, normal bowel sounds EXTREMITIES:  Warm and well perfused, no edema; No deformity, 2+ radial pulses PSYCH: Normal mood and affect   Assessment & Plan       {Are you ordering a CV Procedure (e.g. stress test, cath, DCCV, TEE, etc)?   Press F2        :[id number]}   This note was written with the assistance of a dictation microphone or AI dictation software. Please excuse any typos or grammatical errors.   Signed, [name], MD 12/02/2023 2:17 PM    [facility]

## 2023-12-02 NOTE — Therapy (Signed)
 OUTPATIENT OCCUPATIONAL THERAPY NEURO TREATMENT  Patient Name: Terry Hays MRN: [id number] DOB:04/02/1950, 73 y.o., male Today's Date: 12/02/2023  PCP: [name], MD REFERRING PROVIDER: Bulah Alm RAMAN, PA-C  END OF SESSION:  OT End of Session - 12/02/23 0933     Visit Number 5    Number of Visits 17    Date for Recertification  01/05/24    Authorization Type [employer] Choice PPO    OT Start Time 0930    OT Stop Time 1015    OT Time Calculation (min) 45 min    Activity Tolerance Patient tolerated treatment well    Behavior During Therapy [facility] for tasks assessed/performed            Past Medical History:  Diagnosis Date   Abnormal ECG    Allergy    Anxiety    FLYING   Diverticulosis    Dyslipidemia    History of prostate cancer    Hyperlipidemia    Hypertension    LVH (left ventricular hypertrophy)    Pre-diabetes    Past Surgical History:  Procedure Laterality Date   COLONOSCOPY  2004 2011   MAGOD   EYE SURGERY  03/10/1999   CATOACTS BOTH   HERNIA REPAIR  03/09/1952   R INGHINAL   INGUINAL HERNIA REPAIR Left 04/07/2018   Procedure: OPERN LEFT INGUINAL HERNIA REPAIR WITH MESH ERAS PATHWAY;  Surgeon: Tanda Locus, MD;  Location: [facility];  Service: General;  Laterality: Left;   LOOP RECORDER INSERTION N/A 09/17/2023   Procedure: LOOP RECORDER INSERTION;  Surgeon: Lesia Ozell Barter, PA-C;  Location: [facility] INVASIVE CV LAB;  Service: Cardiovascular;  Laterality: N/A;   PENILE PROSTHESIS IMPLANT  03/09/2009   DAHLSTEDT   PROSTATE SURGERY  03/09/2001   PROSTRATECTOMY   Patient Active Problem List   Diagnosis Date Noted   History of CVA (cerebrovascular accident) 11/06/2023   Cognitive impairment 09/12/2023   Right lower lobe pulmonary nodule 09/08/2023   Attention deficit hyperactivity disorder (ADHD), predominantly hyperactive type 06/03/2021   Cherry angioma 06/03/2021   Seborrheic keratosis 06/03/2021   Arthritis 03/17/2018   Spinal  stenosis of lumbosacral region 03/17/2018   Actinic keratosis 03/17/2018   Degeneration of lumbar intervertebral disc 10/27/2017   History of prostate cancer 09/15/2011   Fear of flying 09/15/2011   Family history of heart disease in male family member before age 42 09/15/2011   Allergic rhinitis 09/15/2011   Hemorrhoids 09/15/2011   Hypertension 06/27/2010   Dyslipidemia    LVH (left ventricular hypertrophy)     ONSET DATE: late June 2025  REFERRING DIAG: Z86.73, Z92.89, H54.7  THERAPY DIAG:  Visuospatial deficit  Unsteadiness on feet  Muscle weakness (generalized)  Rationale for Evaluation and Treatment: Rehabilitation  SUBJECTIVE:   SUBJECTIVE STATEMENT: No pain, no falls  Pt accompanied by: self and friend  PERTINENT HISTORY: CVA 09/08/23, hx of falls  PRECAUTIONS: None  WEIGHT BEARING RESTRICTIONS: No  PAIN:  Are you having pain? No  FALLS: Has patient fallen in last 6 months? Yes. Number of falls unclear, but fell a couple weeks ago  LIVING ENVIRONMENT: Lives with: lives with their spouse Lives in: House/apartment Stairs: Yes: Internal: unclear but has a rail for internal steps steps; steps to enter home with rail and rail doing downstairs Has following equipment at home: Single point cane  PLOF: Independent, Independent with basic ADLs, Independent with household mobility without device, and Independent with transfers  PATIENT GOALS: Pt would like to be  able use his computer again for email, typing in word, etc. Would also love to be able to golf, play pickle ball etc. Ultimate goal is to drive.  OBJECTIVE:  Note: Objective measures were completed at Evaluation unless otherwise noted.  HAND DOMINANCE: Left  ADLs: Overall ADL: Mod I at this time, using a SPC for mobility.   IADLs: Overall pt is not driving and has assist for grocery shopping, bills, cooking, IADLs at this time.  Handwriting: 90% legible  MOBILITY STATUS: Hx of falls and needs  SPC  POSTURE COMMENTS:  rounded shoulders Sitting balance: decresaed core strength noted but overall demonstrates functional sitting balance.   ACTIVITY TOLERANCE: Activity tolerance: decreased but functional for ADL tasks, not able to do IADL tasks such as golf and pickleball  FUNCTIONAL OUTCOME MEASURES: NA - pt truly does not feel like he has LUE deficits from CVA and wants to work on vision  UPPER EXTREMITY ROM:    Active ROM Right eval Left eval  Shoulder flexion    Shoulder abduction    Shoulder adduction    Shoulder extension    Shoulder internal rotation    Shoulder external rotation    Elbow flexion    Elbow extension    Wrist flexion    Wrist extension    Wrist ulnar deviation    Wrist radial deviation    Wrist pronation    Wrist supination    (Blank rows = not tested)  UPPER EXTREMITY MMT:     MMT Right eval Left eval  Shoulder flexion 150 WFL  Shoulder abduction    Shoulder adduction    Shoulder extension    Shoulder internal rotation    Shoulder external rotation    Middle trapezius    Lower trapezius    Elbow flexion    Elbow extension    Wrist flexion    Wrist extension    Wrist ulnar deviation    Wrist radial deviation    Wrist pronation    Wrist supination    (Blank rows = not tested)  Note mild deficits in B shoulders for shoulder flexion 2/2 old injuries from falls   HAND FUNCTION: WFL - note pt verbose and arrived late to evaluation, did not have time to asses grip strength as pt wanted to focus on vision. Will plan to assess in future sessions.   COORDINATION: Finger Nose Finger test: dysmetria and decreased coordination noted but unclear if this is from visual deficits to aim for other hand  11/25/23: 9 hole peg test: RT = 49.58 sec (slower d/t missing far Lt side and difficulty following directions even after multiple cues) Lt = 44.70 (difficulty following directions)   SENSATION: WFL  EDEMA: none  MUSCLE TONE: RUE: Within  functional limits and LUE: Within functional limits  COGNITION: Overall cognitive status: Impaired and Difficulty to assess due to: Note pt does not think deficits are significant but statements from S.O. in room states that cognition has been limited for comprehension, short term memory, and higher level tasks.   VISION: Subjective report: Pt reports he cannot see anything on the left - like its not even there. States he has learned various scanning and compensatory techniques.  Baseline vision: no glassess but demonstrates hemianopsia  Visual history: none prior to CVA   VISION ASSESSMENT: Impaired To be further assessed in functional context Eye alignment: Impaired:   Tracking/Visual pursuits: Decreased smoothness with horizontal tracking, Decreased smoothness with vertical tracking, Decreased smoothness of eye movement to  Left superior field, Decreased smoothness of eye movement to Left inferior field, and Requires cues, head turns, or add eye shifts to track Visual Fields: Left homonymous hemianopsia  Patient has difficulty with following activities due to following visual impairments: driving, cooking, pickleball, golfing, has ad recent falls.   PERCEPTION: Impaired: Inattention/neglect: does not attend to left visual field  PRAXIS: Not tested  OBSERVATIONS: The pt is a 73 yo male who is accompanied by his S.O. this date walking with a SPC who demonstrates dignificant visual deficits post CVA. Pt demonstrates slight deficits in BUE shoulder ROM but is fairly symmetrical and good MMT - does not feel like strength has been impacted from CVA from a neuro stand point. Pt with significant visual field deficits, L hemianopsia, and decreased attention to L visual field impacting skill performance.                                                                                                                             TREATMENT DATE:   12/02/23:   Completing 24 piece puzzle with max  cueing throughout for review and use of compensation strategies, fully scanning to Lt, orienting pc's correctly, and for problem solving. Pt also had difficulty with coordination (placing pieces: Lt hand more difficulty than Rt, but both hands demo difficulty)   Above task took the majority of the session. Pt's wife came last 10 minutes of session and discussed/educated on pt's current deficits and rationale for symptoms. Pt/wife instructed to continue to work on 12 pc puzzles at home for visual/perceptual skills and spatial reasoning as well as promoting visual scanning, working memory, and coordination  PATIENT EDUCATION: Education details: see above (reviewed strategies:  looking for edge of objects, head turns, double-check himself, initial scan before moving/activity, organized scanning pattern) Person educated: Patient and friend Education method: Explanation, Demonstration, Verbal cues, and Handouts Education comprehension: verbalized understanding, returned demonstration, verbal cues required, and needs further education  HOME EXERCISE PROGRAM: 11/22/23:  Visual HEP and compensation strategies  11/25/23: coordination HEP    GOALS: Goals reviewed with patient? Yes  SHORT TERM GOALS: Target date: 12/08/23  Pt will be Supervision with vision specific HEP to further integrate strategies and scanning into daily life for max recovery and benefit.  Baseline: Pt does incooperate techniques from hospital but would benefit from further training  Goal status: Ongoing 11/22/23, educated, but would benefit from review  2.  Pt will demonstrate improved visual scanning to locate 5 items in a room with no more than 2 verbal cues to locate items.  Baseline:  5 items, needing 3+ cues for scanning Goal status: INITIAL  3.  Pt will demonstrate improved visual acuity and/or use of adaptations to use his personal computer for leisure activities such as email, typing in Word, etc with no more than  Supervision.  Baseline: Pt reports significant difficulty with this Goal status: INITIAL  4.  Pt will perform a visual scanning task/worksheet of  choice with no more than 2 errors in order to demonstrate improved scanning and acuity.  Baseline:  Goal status: INITIAL    LONG TERM GOALS: Target date: 01/05/24  Pt will be MOD I with vision specific HEP to further integrate strategies and scanning into daily life for max recovery and benefit.  Baseline: Pt does incooperate techniques from hospital but would benefit from further training  Goal status: INITIAL  2.  Pt will demonstrate improved visual scanning to locate 5 items in a room with no verbal cues to locate items.  Baseline:  5 items, needing 3+ cues for scanning Goal status: INITIAL  3.  Pt will demonstrate improved visual acuity and/or use of adaptations to use his personal computer for leisure activities such as email, typing in Word, etc with Independence. Baseline: Pt reports significant difficulty with this Goal status: INITIAL  4.  If safe and appropriate, pt will report a return to some leisure activities such as modified golf, pickle ball, walking, etc with use of strategies, AE, or compensatory techniques to improve safety and participation.  Baseline: not safe and not performing at this time Goal status: INITIAL    ASSESSMENT:  CLINICAL IMPRESSION: Pt is progressing towards goals with improved use visual compensation strategies, but continues to need cueing for strategy, spatial reasoning, problem solving, visual scanning.  PERFORMANCE DEFICITS: in functional skills including ADLs, IADLs, proprioception, endurance, and vision, cognitive skills including attention, memory, problem solving, safety awareness, and sequencing, and psychosocial skills including coping strategies, environmental adaptation, habits, and routines and behaviors.   IMPAIRMENTS: are limiting patient from ADLs, IADLs, leisure, and social  participation.   CO-MORBIDITIES: has no other co-morbidities that affects occupational performance. Patient will benefit from skilled OT to address above impairments and improve overall function.  MODIFICATION OR ASSISTANCE TO COMPLETE EVALUATION: Min-Moderate modification of tasks or assist with assess necessary to complete an evaluation.  OT OCCUPATIONAL PROFILE AND HISTORY: Detailed assessment: Review of records and additional review of physical, cognitive, psychosocial history related to current functional performance.  CLINICAL DECISION MAKING: Moderate - several treatment options, min-mod task modification necessary  REHAB POTENTIAL: Good  EVALUATION COMPLEXITY: Moderate    PLAN:  OT FREQUENCY: 2x/week  OT DURATION: 8 weeks  PLANNED INTERVENTIONS: 97168 OT Re-evaluation, 97535 self care/ADL training, 02889 therapeutic exercise, 97530 therapeutic activity, and 97112 neuromuscular re-education  RECOMMENDED OTHER SERVICES: NA  CONSULTED AND AGREED WITH PLAN OF CARE: Patient and family member/caregiver  PLAN FOR NEXT SESSION:  review and practice visual compensation strategies, environmental scanning, tabletop scanning activities  Burnard JINNY Roads, OTR/L 12/02/2023, 9:33 AM

## 2023-12-02 NOTE — H&P (View-Only) (Signed)
 Cardiology Office Note:   Date:  12/02/2023  ID:  Terry Hays, DOB [birth year], MRN [id number] PCP: [name], MD  [facility] Providers Cardiologist:  Aleene Passe, MD (Inactive) { Chief Complaint: No chief complaint on file.     History of Present Illness:   Terry Hays is a 73 y.o. male with a PMH of HTN, HLD, prior CVA who presents for follow up.  Last seen by Dr. Passe on 04/02/2022 for follow-up of HTN and HLD.  He was doing well at that time.   Past Medical History:  Diagnosis Date   Abnormal ECG    Allergy    Anxiety    FLYING   Diverticulosis    Dyslipidemia    History of prostate cancer    Hyperlipidemia    Hypertension    LVH (left ventricular hypertrophy)    Pre-diabetes      Studies Reviewed:    EKG: ***       Cardiac Studies & Procedures   ______________________________________________________________________________________________     ECHOCARDIOGRAM  ECHOCARDIOGRAM COMPLETE 09/09/2023  Narrative ECHOCARDIOGRAM REPORT    Patient Name:   Terry Hays [facility] Date of Exam: 09/09/2023 Medical Rec #:  [id number]         Height:       68.0 in Accession #:    [id number]        Weight:       183.0 lb Date of Birth:  22-Jul-1950         BSA:          1.968 m Patient Age:    73 years          BP:           127/50 mmHg Patient Gender: M                 HR:           69 bpm. Exam Location:  Inpatient  Procedure: 2D Echo, Cardiac Doppler, Color Doppler and Intracardiac Opacification Agent (Both Spectral and Color Flow Doppler were utilized during procedure).  Indications:    Stroke  History:        Patient has prior history of Echocardiogram examinations, most recent 10/03/2007. Risk Factors:Hypertension and Dyslipidemia.  Sonographer:    Therisa Crouch Referring Phys: [id number] VISHAL R PATEL  IMPRESSIONS   1. Left ventricular ejection fraction, by estimation, is 60 to 65%. The left ventricle has normal function. The left  ventricle has no regional wall motion abnormalities. There is moderate concentric left ventricular hypertrophy. Left ventricular diastolic parameters are indeterminate. 2. Right ventricular systolic function is normal. The right ventricular size is normal. There is normal pulmonary artery systolic pressure. 3. Left atrial size was moderately dilated. 4. The mitral valve is normal in structure. Trivial mitral valve regurgitation. No evidence of mitral stenosis. 5. The aortic valve is tricuspid. Aortic valve regurgitation is not visualized. No aortic stenosis is present. 6. The inferior vena cava is normal in size with greater than 50% respiratory variability, suggesting right atrial pressure of 3 mmHg.  Comparison(s): Prior images unable to be directly viewed, comparison made by report only.  Conclusion(s)/Recommendation(s): Normal biventricular function without evidence of hemodynamically significant valvular heart disease. No intracardiac source of embolism detected on this transthoracic study. Consider a transesophageal echocardiogram to exclude cardiac source of embolism if clinically indicated. No left ventricular mural or apical thrombus/thrombi.  FINDINGS Left Ventricle: Left ventricular ejection fraction, by estimation, is 60 to 65%.  The left ventricle has normal function. The left ventricle has no regional wall motion abnormalities. Definity  contrast agent was given IV to delineate the left ventricular endocardial borders. The left ventricular internal cavity size was normal in size. There is moderate concentric left ventricular hypertrophy. Left ventricular diastolic parameters are indeterminate.  Right Ventricle: The right ventricular size is normal. No increase in right ventricular wall thickness. Right ventricular systolic function is normal. There is normal pulmonary artery systolic pressure. The tricuspid regurgitant velocity is 2.47 m/s, and with an assumed right atrial pressure of 3  mmHg, the estimated right ventricular systolic pressure is 27.4 mmHg.  Left Atrium: Left atrial size was moderately dilated.  Right Atrium: Right atrial size was normal in size.  Pericardium: There is no evidence of pericardial effusion.  Mitral Valve: The mitral valve is normal in structure. Trivial mitral valve regurgitation. No evidence of mitral valve stenosis.  Tricuspid Valve: The tricuspid valve is normal in structure. Tricuspid valve regurgitation is trivial. No evidence of tricuspid stenosis.  Aortic Valve: The aortic valve is tricuspid. Aortic valve regurgitation is not visualized. No aortic stenosis is present.  Pulmonic Valve: The pulmonic valve was not well visualized. Pulmonic valve regurgitation is trivial. No evidence of pulmonic stenosis.  Aorta: The aortic root, ascending aorta and aortic arch are all structurally normal, with no evidence of dilitation or obstruction.  Venous: The inferior vena cava is normal in size with greater than 50% respiratory variability, suggesting right atrial pressure of 3 mmHg.  IAS/Shunts: The atrial septum is grossly normal.   LEFT VENTRICLE PLAX 2D LVIDd:         4.40 cm      Diastology LVIDs:         2.80 cm      LV e' medial:    7.29 cm/s LV PW:         1.61 cm      LV E/e' medial:  6.4 LV IVS:        1.50 cm      LV e' lateral:   9.03 cm/s LVOT diam:     2.20 cm      LV E/e' lateral: 5.2 LVOT Area:     3.80 cm  LV Volumes (MOD) LV vol d, MOD A2C: 86.6 ml LV vol d, MOD A4C: 122.0 ml LV vol s, MOD A2C: 34.0 ml LV vol s, MOD A4C: 47.1 ml LV SV MOD A2C:     52.6 ml LV SV MOD A4C:     122.0 ml LV SV MOD BP:      68.4 ml  RIGHT VENTRICLE             IVC RV Basal diam:  2.50 cm     IVC diam: 1.70 cm RV S prime:     16.80 cm/s TAPSE (M-mode): 1.9 cm  LEFT ATRIUM             Index LA diam:        4.60 cm 2.34 cm/m LA Vol (A2C):   74.2 ml 37.70 ml/m LA Vol (A4C):   89.0 ml 45.22 ml/m LA Biplane Vol: 82.8 ml 42.07  ml/m  AORTA Ao Root diam: 3.70 cm Ao Asc diam:  3.00 cm  MITRAL VALVE               TRICUSPID VALVE MV Area (PHT): 4.54 cm    TR Peak grad:   24.4 mmHg MV Decel Time: 167 msec    TR  Vmax:        247.00 cm/s MV E velocity: 46.70 cm/s MV A velocity: 49.30 cm/s  SHUNTS MV E/A ratio:  0.95        Systemic Diam: 2.20 cm  Shelda Bruckner MD Electronically signed by Shelda Bruckner MD Signature Date/Time: 09/09/2023/2:02:15 PM    Final          ______________________________________________________________________________________________      Risk Assessment/Calculations:   {Does this patient have ATRIAL FIBRILLATION?[phone]} No BP recorded.  {Refresh Note OR Click here to enter BP  :1}***        Physical Exam:     VS:  There were no vitals taken for this visit. ***    Wt Readings from Last 3 Encounters:  11/09/23 191 lb 6.4 oz (86.8 kg)  10/04/23 186 lb 12.8 oz (84.7 kg)  09/08/23 183 lb (83 kg)     GEN: Well nourished, well developed, in no acute distress NECK: No JVD; No carotid bruits CARDIAC: ***RRR, no murmurs, rubs, gallops RESPIRATORY:  Clear to auscultation without rales, wheezing or rhonchi  ABDOMEN: Soft, non-tender, non-distended, normal bowel sounds EXTREMITIES:  Warm and well perfused, no edema; No deformity, 2+ radial pulses PSYCH: Normal mood and affect   Assessment & Plan       {Are you ordering a CV Procedure (e.g. stress test, cath, DCCV, TEE, etc)?   Press F2        :[id number]}   This note was written with the assistance of a dictation microphone or AI dictation software. Please excuse any typos or grammatical errors.   Signed, [name], MD 12/02/2023 2:17 PM    [facility]

## 2023-12-03 ENCOUNTER — Encounter: Payer: Self-pay | Admitting: Student in an Organized Health Care Education/Training Program

## 2023-12-03 ENCOUNTER — Encounter: Admitting: Occupational Therapy

## 2023-12-03 ENCOUNTER — Other Ambulatory Visit: Payer: Self-pay

## 2023-12-03 ENCOUNTER — Ambulatory Visit
Attending: Student in an Organized Health Care Education/Training Program | Admitting: Student in an Organized Health Care Education/Training Program

## 2023-12-03 VITALS — BP 94/68 | HR 60 | Ht 67.0 in | Wt 190.6 lb

## 2023-12-03 DIAGNOSIS — I10 Essential (primary) hypertension: Secondary | ICD-10-CM

## 2023-12-03 DIAGNOSIS — E785 Hyperlipidemia, unspecified: Secondary | ICD-10-CM

## 2023-12-03 DIAGNOSIS — I2510 Atherosclerotic heart disease of native coronary artery without angina pectoris: Secondary | ICD-10-CM | POA: Diagnosis not present

## 2023-12-03 DIAGNOSIS — Z8673 Personal history of transient ischemic attack (TIA), and cerebral infarction without residual deficits: Secondary | ICD-10-CM

## 2023-12-03 MED ORDER — ATORVASTATIN CALCIUM 80 MG PO TABS: 80.0000 mg | ORAL_TABLET | Freq: Every day | ORAL | 3 refills | Status: AC

## 2023-12-03 NOTE — Assessment & Plan Note (Signed)
 His most recent LDL was 71 but I would prefer it to be less than 55 given his history of recurrent CVAs and significant calcium  found in his arteries on CT imaging.  I will increase his atorvastatin  to 80 mg daily. -Increase atorvastatin  to 80 mg daily -Lipid panel in 3 months

## 2023-12-03 NOTE — Assessment & Plan Note (Addendum)
 Patient had a recent CVA back in July and multiple areas of the brain with some concern for there being a cardioembolic source.  He currently has an ILR in place without evidence of atrial arrhythmias currently.  TTE performed in the hospital was not a bubble study.  I think that he warrants TEE with bubble to rule out PFO or ASD and to assess for any LA masses as potential sources for CVA.  I counseled the patient's on the risk and benefits of a TEE and he is okay to proceed. -TEE with me on 10/6 -CBC, BMP prior to TEE -Continue aspirin  81 mg indefinitely -Cholesterol management as below -Follow-up in 6 months

## 2023-12-03 NOTE — Patient Instructions (Signed)
 Medication Instructions:  INCREASE Atorvastatin  to 80 mg daily   *If you need a refill on your cardiac medications before your next appointment, please call your pharmacy*  Lab Work: TODAY: CBC, BMP  Lipid panel in 3 months   If you have labs (blood work) drawn today and your tests are completely normal, you will receive your results only by: MyChart Message (if you have MyChart) OR A paper copy in the mail If you have any lab test that is abnormal or we need to change your treatment, we will call you to review the results.  Testing/Procedures: TEE with bubble   Your physician has requested that you have a TEE. During a TEE, sound waves are used to create images of your heart. It provides your doctor with information about the size and shape of your heart and how well your heart's chambers and valves are working. In this test, a transducer is attached to the end of a flexible tube that's guided down your throat and into your esophagus (the tube leading from you mouth to your stomach) to get a more detailed image of your heart. You are not awake for the procedure. Please see the instruction sheet given to you today. For further information please visit [email or web address]    Follow-Up: At [facility], you and your health needs are our priority.  As part of our continuing mission to provide you with exceptional heart care, our providers are all part of one team.  This team includes your primary Cardiologist (physician) and Advanced Practice Providers or APPs (Physician Assistants and Nurse Practitioners) who all work together to provide you with the care you need, when you need it.  Your next appointment:   6 month(s)  Provider:   [name], MD        Other Instructions     Dear Terry Hays  You are scheduled for a TEE (Transesophageal Echocardiogram) with Bubble study  on Monday, October 6 with Dr. Archer.  Please arrive at the [facility] (Main Entrance A)  at [facility]: [address] [place], [place] [address] at 8:30 AM (This time is 1 hour(s) before your procedure to ensure your preparation).   Free valet parking service is available. You will check in at ADMITTING.   *Please Note: You will receive a call the day before your procedure to confirm the appointment time. That time may have changed from the original time based on the schedule for that day.*    DIET:  Nothing to eat or drink after midnight except a sip of water with medications (see medication instructions below)  MEDICATION INSTRUCTIONS: !!IF ANY NEW MEDICATIONS ARE STARTED AFTER TODAY, PLEASE NOTIFY YOUR PROVIDER AS SOON AS POSSIBLE!!  FYI: Medications such as Semaglutide (Ozempic, [place]), Tirzepatide (Mounjaro, Zepbound), Dulaglutide (Trulicity), etc (GLP1 agonists) AND Canagliflozin (Invokana), Dapagliflozin (Farxiga), Empagliflozin (Jardiance), Ertugliflozin (Steglatro), Bexagliflozin [employer]) or any combination with one of these drugs such as Invokamet (Canagliflozin/Metformin), Synjardy (Empagliflozin/Metformin), etc (SGLT2 inhibitors) must be held around the time of a procedure. This is not a comprehensive list of all of these drugs. Please review all of your medications and talk to your provider if you take any one of these. If you are not sure, ask your provider.        LABS: CBC, BMP Come to the lab at the [facility] ([address], [place], 1st Floor) between the hours of 8:00 am and 4:30 pm. You  do NOT have to be fasting.  FYI:  For your safety, and to allow us  to monitor your vital signs accurately during the surgery/procedure we request: If you have artificial nails, gel coating, SNS etc, please have those removed prior to your surgery/procedure. Not having the nail coverings /polish removed may result in cancellation or delay of your surgery/procedure.  Your support person will be asked to wait in the  waiting room during your procedure.  It is OK to have someone drop you off and come back when you are ready to be discharged.  You cannot drive after the procedure and will need someone to drive you home.  Bring your insurance cards.  *Special Note: Every effort is made to have your procedure done on time. Occasionally there are emergencies that occur at the hospital that may cause delays. Please be patient if a delay does occur.

## 2023-12-03 NOTE — Assessment & Plan Note (Signed)
 BP is at goal today.  No changes. -Continue amlodipine  and losartan 

## 2023-12-03 NOTE — Assessment & Plan Note (Signed)
 Incidentally found to have three-vessel CAC on CT imaging.  No cardiopulmonary symptoms currently.  No need for further workup at this time but will focus on aggressive secondary prevention. -Continue baby aspirin  indefinitely -Changing statin as described above

## 2023-12-04 LAB — BASIC METABOLIC PANEL WITH GFR
BUN/Creatinine Ratio: 12 (ref 10–24)
BUN: 13 mg/dL (ref 8–27)
CO2: 23 mmol/L (ref 20–29)
Calcium: 9.6 mg/dL (ref 8.6–10.2)
Chloride: 104 mmol/L (ref 96–106)
Creatinine, Ser: 1.1 mg/dL (ref 0.76–1.27)
Glucose: 133 mg/dL — ABNORMAL HIGH (ref 70–99)
Potassium: 4.5 mmol/L (ref 3.5–5.2)
Sodium: 140 mmol/L (ref 134–144)
eGFR: 70 mL/min/1.73 (ref >59)

## 2023-12-04 LAB — CBC
Hematocrit: 49.1 % (ref 37.5–51.0)
Hemoglobin: 15.6 g/dL (ref 13.0–17.7)
MCH: 28.1 pg (ref 26.6–33.0)
MCHC: 31.8 g/dL (ref 31.5–35.7)
MCV: 89 fL (ref 79–97)
Platelets: 226 x10E3/uL (ref 150–450)
RBC: 5.55 x10E6/uL (ref 4.14–5.80)
RDW: 13.9 % (ref 11.6–15.4)
WBC: 6.4 x10E3/uL (ref 3.4–10.8)

## 2023-12-04 NOTE — Therapy (Signed)
 OUTPATIENT OCCUPATIONAL THERAPY NEURO TREATMENT  Patient Name: Terry Hays MRN: [id number] DOB:12-Nov-1950, 73 y.o., male Today's Date: 12/06/2023  PCP: [name], MD REFERRING PROVIDER: Bulah Alm RAMAN, PA-C  END OF SESSION:  OT End of Session - 12/06/23 0939     Visit Number 6    Number of Visits 17    Date for Recertification  01/05/24    Authorization Type [employer] PPO--17 visits approved 11/10/23-01/05/24    Authorization - Visit Number 6    Authorization - Number of Visits 17    Progress Note Due on Visit 10    OT Start Time 0935    OT Stop Time 1015    OT Time Calculation (min) 40 min    Activity Tolerance Patient tolerated treatment well    Behavior During Therapy [facility] for tasks assessed/performed             Past Medical History:  Diagnosis Date   Abnormal ECG    Allergy    Anxiety    FLYING   Diverticulosis    Dyslipidemia    History of prostate cancer    Hyperlipidemia    Hypertension    LVH (left ventricular hypertrophy)    Pre-diabetes    Past Surgical History:  Procedure Laterality Date   COLONOSCOPY  2004 2011   MAGOD   EYE SURGERY  03/10/1999   CATOACTS BOTH   HERNIA REPAIR  03/09/1952   R INGHINAL   INGUINAL HERNIA REPAIR Left 04/07/2018   Procedure: OPERN LEFT INGUINAL HERNIA REPAIR WITH MESH ERAS PATHWAY;  Surgeon: Tanda Locus, MD;  Location: [facility];  Service: General;  Laterality: Left;   LOOP RECORDER INSERTION N/A 09/17/2023   Procedure: LOOP RECORDER INSERTION;  Surgeon: Lesia Ozell Barter, PA-C;  Location: [facility] INVASIVE CV LAB;  Service: Cardiovascular;  Laterality: N/A;   PENILE PROSTHESIS IMPLANT  03/09/2009   DAHLSTEDT   PROSTATE SURGERY  03/09/2001   PROSTRATECTOMY   Patient Active Problem List   Diagnosis Date Noted   Coronary artery disease involving native coronary artery of native heart without angina pectoris 12/03/2023   History of CVA (cerebrovascular accident) 11/06/2023   Cognitive  impairment 09/12/2023   Right lower lobe pulmonary nodule 09/08/2023   Attention deficit hyperactivity disorder (ADHD), predominantly hyperactive type 06/03/2021   Cherry angioma 06/03/2021   Seborrheic keratosis 06/03/2021   Arthritis 03/17/2018   Spinal stenosis of lumbosacral region 03/17/2018   Actinic keratosis 03/17/2018   Degeneration of lumbar intervertebral disc 10/27/2017   History of prostate cancer 09/15/2011   Fear of flying 09/15/2011   Family history of heart disease in male family member before age 31 09/15/2011   Allergic rhinitis 09/15/2011   Hemorrhoids 09/15/2011   Hypertension 06/27/2010   Dyslipidemia    LVH (left ventricular hypertrophy)     ONSET DATE: late June 2025  REFERRING DIAG: Z86.73, Z92.89, H54.7  THERAPY DIAG:  Visuospatial deficit  Unsteadiness on feet  Rationale for Evaluation and Treatment: Rehabilitation  SUBJECTIVE:   SUBJECTIVE STATEMENT: He got me puzzles that I can't do  Pt demo use of contrast/color tape on wallet to make it easier to find at home.  Pt accompanied by: self and friend  PERTINENT HISTORY: CVA 09/08/23, hx of falls  PRECAUTIONS: None  WEIGHT BEARING RESTRICTIONS: No  PAIN:  Are you having pain? No  FALLS: Has patient fallen in last 6 months? Yes. Number of falls unclear, but fell a couple weeks ago  LIVING ENVIRONMENT: Lives  with: lives with their spouse Lives in: House/apartment Stairs: Yes: Internal: unclear but has a rail for internal steps steps; steps to enter home with rail and rail doing downstairs Has following equipment at home: Single point cane  PLOF: Independent, Independent with basic ADLs, Independent with household mobility without device, and Independent with transfers  PATIENT GOALS: Pt would like to be able use his computer again for email, typing in word, etc. Would also love to be able to golf, play pickle ball etc. Ultimate goal is to drive.  OBJECTIVE:  Note: Objective measures  were completed at Evaluation unless otherwise noted.  HAND DOMINANCE: Left  ADLs: Overall ADL: Mod I at this time, using a SPC for mobility.   IADLs: Overall pt is not driving and has assist for grocery shopping, bills, cooking, IADLs at this time.  Handwriting: 90% legible  MOBILITY STATUS: Hx of falls and needs SPC  POSTURE COMMENTS:  rounded shoulders Sitting balance: decresaed core strength noted but overall demonstrates functional sitting balance.   ACTIVITY TOLERANCE: Activity tolerance: decreased but functional for ADL tasks, not able to do IADL tasks such as golf and pickleball  FUNCTIONAL OUTCOME MEASURES: NA - pt truly does not feel like he has LUE deficits from CVA and wants to work on vision  UPPER EXTREMITY ROM:    Active ROM Right eval Left eval  Shoulder flexion    Shoulder abduction    Shoulder adduction    Shoulder extension    Shoulder internal rotation    Shoulder external rotation    Elbow flexion    Elbow extension    Wrist flexion    Wrist extension    Wrist ulnar deviation    Wrist radial deviation    Wrist pronation    Wrist supination    (Blank rows = not tested)  UPPER EXTREMITY MMT:     MMT Right eval Left eval  Shoulder flexion 150 WFL  Shoulder abduction    Shoulder adduction    Shoulder extension    Shoulder internal rotation    Shoulder external rotation    Middle trapezius    Lower trapezius    Elbow flexion    Elbow extension    Wrist flexion    Wrist extension    Wrist ulnar deviation    Wrist radial deviation    Wrist pronation    Wrist supination    (Blank rows = not tested)  Note mild deficits in B shoulders for shoulder flexion 2/2 old injuries from falls   HAND FUNCTION: WFL - note pt verbose and arrived late to evaluation, did not have time to asses grip strength as pt wanted to focus on vision. Will plan to assess in future sessions.   COORDINATION: Finger Nose Finger test: dysmetria and decreased  coordination noted but unclear if this is from visual deficits to aim for other hand  11/25/23: 9 hole peg test: RT = 49.58 sec (slower d/t missing far Lt side and difficulty following directions even after multiple cues) Lt = 44.70 (difficulty following directions)   SENSATION: WFL  EDEMA: none  MUSCLE TONE: RUE: Within functional limits and LUE: Within functional limits  COGNITION: Overall cognitive status: Impaired and Difficulty to assess due to: Note pt does not think deficits are significant but statements from S.O. in room states that cognition has been limited for comprehension, short term memory, and higher level tasks.   VISION: Subjective report: Pt reports he cannot see anything on the left - like  its not even there. States he has learned various scanning and compensatory techniques.  Baseline vision: no glassess but demonstrates hemianopsia  Visual history: none prior to CVA   VISION ASSESSMENT: Impaired To be further assessed in functional context Eye alignment: Impaired:   Tracking/Visual pursuits: Decreased smoothness with horizontal tracking, Decreased smoothness with vertical tracking, Decreased smoothness of eye movement to Left superior field, Decreased smoothness of eye movement to Left inferior field, and Requires cues, head turns, or add eye shifts to track Visual Fields: Left homonymous hemianopsia  Patient has difficulty with following activities due to following visual impairments: driving, cooking, pickleball, golfing, has ad recent falls.   PERCEPTION: Impaired: Inattention/neglect: does not attend to left visual field  PRAXIS: Not tested  OBSERVATIONS: The pt is a 73 yo male who is accompanied by his S.O. this date walking with a SPC who demonstrates dignificant visual deficits post CVA. Pt demonstrates slight deficits in BUE shoulder ROM but is fairly symmetrical and good MMT - does not feel like strength has been impacted from CVA from a neuro stand  point. Pt with significant visual field deficits, L hemianopsia, and decreased attention to L visual field impacting skill performance.                                                                                                                             TREATMENT DATE:  12/06/23  - Constant therapy:  Min cueing for use of visual compensation strategies Symbol Match:  Level 5, 90% accuracy, with 74.38sec average response/completion time. Then, Level 7, 93% accuracy, with 67.03sec average response/completion time.    - Environmental Scanning/navigation (ambulating) in min distracting environment with 12/13 accuracy = 92%, with min v.c. initially for remaining item (for use of strategy, full head turn/look to edge).  - 1.5 Visual scanning/number cancellation sheet with 93% accuracy and initiated use of line guide as compensation strategy and use of line guide.  - Began checking STGs and discussing progress--see below   PATIENT EDUCATION: Education details:  see above for cueing/reinforced use of visual compensation strategies (organized scanning pattern, use of edges, use of contrast, head turns, double checking) Person educated: Patient and friend Education method: Explanation, Demonstration, Verbal cues, and Handouts Education comprehension: verbalized understanding, returned demonstration, verbal cues required, and needs further education  HOME EXERCISE PROGRAM: 11/22/23:  Visual HEP and compensation strategies  11/25/23: coordination HEP    GOALS: Goals reviewed with patient? Yes  SHORT TERM GOALS: Target date: 12/08/23  Pt will be Supervision with vision specific HEP to further integrate strategies and scanning into daily life for max recovery and benefit.  Baseline: Pt does incooperate techniques from hospital but would benefit from further training  Goal status: Ongoing 11/26/23, educated, but needs min cueing/review at times  2.  Pt will demonstrate improved visual scanning  to locate 5 items in a room with no more than 2 verbal cues to locate items.  Baseline:  5 items,  needing 3+ cues for scanning Goal status: Met in min distracting environment.  12/06/23  3.  Pt will demonstrate improved visual acuity and/or use of adaptations to use his personal computer for leisure activities such as email, typing in Word, etc with no more than Supervision.  Baseline: Pt reports significant difficulty with this Goal status: INITIAL  4.  Pt will perform a visual scanning task/worksheet of choice with no more than 2 errors in order to demonstrate improved scanning and acuity.  Goal status: Ongoing 12/06/23 1.68M scanning sheet with 7 errors (93%)     LONG TERM GOALS: Target date: 01/05/24  Pt will be MOD I with vision specific HEP to further integrate strategies and scanning into daily life for max recovery and benefit.  Baseline: Pt does incooperate techniques from hospital but would benefit from further training  Goal status: INITIAL  2.  Pt will demonstrate improved visual scanning to locate 5 items in a room with no verbal cues to locate items.  Baseline:  5 items, needing 3+ cues for scanning Goal status: INITIAL  3.  Pt will demonstrate improved visual acuity and/or use of adaptations to use his personal computer for leisure activities such as email, typing in Word, etc with Independence. Baseline: Pt reports significant difficulty with this Goal status: INITIAL  4.  If safe and appropriate, pt will report a return to some leisure activities such as modified golf, pickle ball, walking, etc with use of strategies, AE, or compensatory techniques to improve safety and participation.  Baseline: not safe and not performing at this time Goal status: INITIAL    ASSESSMENT:  CLINICAL IMPRESSION: Pt is progressing towards goals with improved use visual compensation strategies.  Simple environmental scanning and tabletop scanning with improved accuracy today.  STG #2 met.   Pt would benefit from further practice/reinforcement of strategies in more complex situations/environments.  PERFORMANCE DEFICITS: in functional skills including ADLs, IADLs, proprioception, endurance, and vision, cognitive skills including attention, memory, problem solving, safety awareness, and sequencing, and psychosocial skills including coping strategies, environmental adaptation, habits, and routines and behaviors.   IMPAIRMENTS: are limiting patient from ADLs, IADLs, leisure, and social participation.   CO-MORBIDITIES: has no other co-morbidities that affects occupational performance. Patient will benefit from skilled OT to address above impairments and improve overall function.  MODIFICATION OR ASSISTANCE TO COMPLETE EVALUATION: Min-Moderate modification of tasks or assist with assess necessary to complete an evaluation.  OT OCCUPATIONAL PROFILE AND HISTORY: Detailed assessment: Review of records and additional review of physical, cognitive, psychosocial history related to current functional performance.  CLINICAL DECISION MAKING: Moderate - several treatment options, min-mod task modification necessary  REHAB POTENTIAL: Good  EVALUATION COMPLEXITY: Moderate   PLAN:  OT FREQUENCY: 2x/week  OT DURATION: 8 weeks  PLANNED INTERVENTIONS: 97168 OT Re-evaluation, 97535 self care/ADL training, 02889 therapeutic exercise, 97530 therapeutic activity, and 97112 neuromuscular re-education  RECOMMENDED OTHER SERVICES: NA  CONSULTED AND AGREED WITH PLAN OF CARE: Patient and family member/caregiver  PLAN FOR NEXT SESSION:  check remaining STGs, strategies/practice using computer, continue to review and practice visual compensation strategies, environmental scanning, tabletop scanning activities  Teralyn Mullins, OTR/L 12/06/2023, 10:50 AM

## 2023-12-05 ENCOUNTER — Ambulatory Visit: Payer: Self-pay | Admitting: Student in an Organized Health Care Education/Training Program

## 2023-12-06 ENCOUNTER — Ambulatory Visit: Admitting: Occupational Therapy

## 2023-12-06 ENCOUNTER — Encounter: Payer: Self-pay | Admitting: Physical Therapy

## 2023-12-06 ENCOUNTER — Ambulatory Visit: Admitting: Physical Therapy

## 2023-12-06 ENCOUNTER — Telehealth: Payer: Self-pay | Admitting: Student in an Organized Health Care Education/Training Program

## 2023-12-06 ENCOUNTER — Encounter

## 2023-12-06 ENCOUNTER — Encounter: Payer: Self-pay | Admitting: Occupational Therapy

## 2023-12-06 DIAGNOSIS — H5410 Blindness, one eye, low vision other eye, unspecified eyes: Secondary | ICD-10-CM | POA: Diagnosis not present

## 2023-12-06 DIAGNOSIS — R2681 Unsteadiness on feet: Secondary | ICD-10-CM

## 2023-12-06 DIAGNOSIS — R41841 Cognitive communication deficit: Secondary | ICD-10-CM | POA: Diagnosis not present

## 2023-12-06 DIAGNOSIS — R2689 Other abnormalities of gait and mobility: Secondary | ICD-10-CM

## 2023-12-06 DIAGNOSIS — I639 Cerebral infarction, unspecified: Secondary | ICD-10-CM | POA: Diagnosis not present

## 2023-12-06 DIAGNOSIS — M6281 Muscle weakness (generalized): Secondary | ICD-10-CM

## 2023-12-06 DIAGNOSIS — R41842 Visuospatial deficit: Secondary | ICD-10-CM

## 2023-12-06 DIAGNOSIS — I69815 Cognitive social or emotional deficit following other cerebrovascular disease: Secondary | ICD-10-CM | POA: Diagnosis not present

## 2023-12-06 DIAGNOSIS — H53462 Homonymous bilateral field defects, left side: Secondary | ICD-10-CM | POA: Diagnosis not present

## 2023-12-06 NOTE — Telephone Encounter (Signed)
 Wife Murrel) wants to reschedule patient's procedure to Tuesday, 10/7 or Wednesday, 10/8.  Wife wants to know how long procedure will take.

## 2023-12-06 NOTE — Telephone Encounter (Signed)
 Spoke to patient's wife she was requesting TEE to be rescheduled to 10/8 at a earlier time.[facility] scheduler was called no earlier times available on 10/8.Wife stated he will keep TEE as scheduled 10/6 Arrive at 8:30 am.

## 2023-12-06 NOTE — Therapy (Signed)
 OUTPATIENT PHYSICAL THERAPY NEURO TREATMENT NOTE   Patient Name: Terry Hays MRN: [id number] DOB:05/06/1950, 73 y.o., male Today's Date: 12/06/2023   PCP: [name], MD  REFERRING PROVIDER: Bulah Alm RAMAN, PA-C   END OF SESSION:  PT End of Session - 12/06/23 1018     Visit Number 4    Number of Visits 13    Date for Recertification  12/17/23    Authorization Type [employer] Medicare-submitted auth at eval    Authorization Time Period 8-29 - 12-17-23    Authorization - Number of Visits 13    Progress Note Due on Visit 10    PT Start Time 1018    PT Stop Time 1100    PT Time Calculation (min) 42 min    Equipment Utilized During Treatment Gait belt    Activity Tolerance Patient tolerated treatment well    Behavior During Therapy WFL for tasks assessed/performed           Past Medical History:  Diagnosis Date   Abnormal ECG    Allergy    Anxiety    FLYING   Diverticulosis    Dyslipidemia    History of prostate cancer    Hyperlipidemia    Hypertension    LVH (left ventricular hypertrophy)    Pre-diabetes    Past Surgical History:  Procedure Laterality Date   COLONOSCOPY  2004 2011   MAGOD   EYE SURGERY  03/10/1999   CATOACTS BOTH   HERNIA REPAIR  03/09/1952   R INGHINAL   INGUINAL HERNIA REPAIR Left 04/07/2018   Procedure: OPERN LEFT INGUINAL HERNIA REPAIR WITH MESH ERAS PATHWAY;  Surgeon: Tanda Locus, MD;  Location: [facility];  Service: General;  Laterality: Left;   LOOP RECORDER INSERTION N/A 09/17/2023   Procedure: LOOP RECORDER INSERTION;  Surgeon: Lesia Ozell Barter, PA-C;  Location: [facility] INVASIVE CV LAB;  Service: Cardiovascular;  Laterality: N/A;   PENILE PROSTHESIS IMPLANT  03/09/2009   DAHLSTEDT   PROSTATE SURGERY  03/09/2001   PROSTRATECTOMY   Patient Active Problem List   Diagnosis Date Noted   Coronary artery disease involving native coronary artery of native heart without angina pectoris 12/03/2023   History of CVA  (cerebrovascular accident) 11/06/2023   Cognitive impairment 09/12/2023   Right lower lobe pulmonary nodule 09/08/2023   Attention deficit hyperactivity disorder (ADHD), predominantly hyperactive type 06/03/2021   Cherry angioma 06/03/2021   Seborrheic keratosis 06/03/2021   Arthritis 03/17/2018   Spinal stenosis of lumbosacral region 03/17/2018   Actinic keratosis 03/17/2018   Degeneration of lumbar intervertebral disc 10/27/2017   History of prostate cancer 09/15/2011   Fear of flying 09/15/2011   Family history of heart disease in male family member before age 16 09/15/2011   Allergic rhinitis 09/15/2011   Hemorrhoids 09/15/2011   Hypertension 06/27/2010   Dyslipidemia    LVH (left ventricular hypertrophy)     ONSET DATE: 10/20/2023 (MD referral)  REFERRING DIAG:  Z86.73 (ICD-10-CM) - History of stroke  H54.7 (ICD-10-CM) - Vision loss  Z92.89 (ICD-10-CM) - History of recent hospitalization    THERAPY DIAG:  Unsteadiness on feet  Muscle weakness (generalized)  Other abnormalities of gait and mobility  Rationale for Evaluation and Treatment: Rehabilitation  SUBJECTIVE:  SUBJECTIVE STATEMENT:  Had a busy weekend with his grand kids. Was going shopping for a sports jacket for a wedding and stumbled due to not paying attention to his surroundings. His eyes are exhausted from OT.   Pt accompanied by: self and Rick  PERTINENT HISTORY: PMH prior CVAs (unknown to patient), ADHD, HTN, HLD, prostate cancer, diverticulosis who presented to the ER with confusion and left visual field changes (09/2023)  PAIN:  Are you having pain? No  PRECAUTIONS: Fall and Other: decreased peripheral vision from CVA   Avoid driving  RED FLAGS: None   WEIGHT BEARING RESTRICTIONS: No  FALLS: Has patient  fallen in last 6 months? Yes. Number of falls 1-R knee gave way  LIVING ENVIRONMENT: Lives with: lives with their spouse Lives in: House/apartment Stairs: steps to enter with rails; upstairs and downstairs with rail Has following equipment at home: Single point cane  PLOF: Independent and Leisure: played pickleball and golf; was working out for core strengthening and walking several miles everyday  PATIENT GOALS: To get back to playing pickleball [facility]) and golf  OBJECTIVE:  Note: Objective measures were completed at Evaluation unless otherwise noted.  DIAGNOSTIC FINDINGS: acute CVA of the right posterior cerebral artery,   COGNITION: Overall cognitive status: Within functional limits for tasks assessed   SENSATION: Light touch: WFL  COORDINATION: WFL for bilat alt taps   MUSCLE TONE: WFL  POSTURE: rounded shoulders  LOWER EXTREMITY ROM:   WFL BLEs   LOWER EXTREMITY MMT:    MMT Right Eval Left Eval  Hip flexion 4 4+  Hip extension    Hip abduction 5 5  Hip adduction 5 5  Hip internal rotation    Hip external rotation    Knee flexion 5 5  Knee extension 4 4  Ankle dorsiflexion 4 4  Ankle plantarflexion    Ankle inversion    Ankle eversion    (Blank rows = not tested)  TRANSFERS: Sit to stand: Modified independence  Assistive device utilized: None     Stand to sit: Modified independence  Assistive device utilized: None      GAIT: Findings: Gait Characteristics: veers to R, step through pattern, and wide BOS, Distance walked: 50 ft, Assistive device utilized:Single point cane, and Level of assistance: SBA  FUNCTIONAL TESTS:  5 times sit to stand: 13.91 sec Timed up and go (TUG): NT 10 meter walk test: 13.41 veers to R= 2.44 ft/sec FGA= 16/30  M-CTSIB  Condition 1: Firm Surface, EO 30 Sec, Normal Sway  Condition 2: Firm Surface, EC 30 Sec, Normal Sway  Condition 3: Foam Surface, EO 30 Sec, Mild Sway  Condition 4: Foam Surface, EC 30 Sec, Mild  and Moderate Sway                                                                                                                                  TREATMENT DATE: 12/06/23  NMR:  With 4 blaze pods on mirror (2 on R side, 2 on L side) to work on visual scanning, reaching outside of BOS, reaction times, attention to L side, performed on single hit setting, standing on air ex for compliant surface  Performed 1 bout of 1 minute with just 4 blaze pods on mirror: 18 hits Performed 3 bouts of 1 minute with added 2 blaze pods on floor for lateral stepping: 9 hits, 18 hits, 18 hits Pt reporting RPE as 10/10, I was working like a maniac   On rockerboard in A/P direction: Reaching across body for bean bag to grab it outside of BOS and toss into crate, performed 12 reps to L side (for scanning to L side) and 10 reps to R side, pt initially needing intermittent UE support for balance, but improved with incr reps and able to utilize hip/ankle strategy  Performed alternating SLS taps to 2 cones, 10 reps each side, pt very challenged by this and fatigued easily   On air ex: Feet together EO: 2x 10 reps head turns, 2 x 10 reps head nods, cues to just move head instead of whole body  Feet hip width > more narrow BOS: 2 x 30 seconds EC Mild postural sway, but able to use ankle strategy to maintain   PATIENT EDUCATION: Education details: Continue HEP and corner balance additions   Person educated: Patient Education method: Programmer, multimedia, Facilities manager, Verbal cues, and Handouts Education comprehension: verbalized understanding, returned demonstration, and needs further education  HOME EXERCISE PROGRAM: Access Code: [email or web address] URL: [email or web address] Date: 12/06/2023 Prepared by: Sheffield Senate  Exercises - Single Leg Stance with Support  - 1 x daily - 7 x weekly - 1 sets - 2-3 reps - 10 secs hold - Tandem Stance with Chair Support  - 1 x daily - 7 x weekly - 1 sets - 2 reps - 30  secs  hold - Standing Single Leg Heel Raise  - 1 x daily - 7 x weekly - 1 sets - 10 reps - 2 secs hold - Walking with Head Rotation  - 1 x daily - 7 x weekly - 1 sets - 10 reps - Romberg Stance Eyes Closed on Foam Pad  - 1 x daily - 5 x weekly - 3 sets - 30 hold - Romberg Stance on Foam Pad with Head Rotation  - 1 x daily - 5 x weekly - 2 sets - 10 reps  GOALS: Goals reviewed with patient? Yes  SHORT TERM GOALS: Target date: 11/19/2023  Pt will be independent with HEP for improved balance and gait. Baseline: Goal status: Not met - 1st PT appt 11-22-23  2.  Pt will improve 5x sit<>stand to less than or equal to 12 sec to demonstrate improved functional strength and transfer efficiency. Baseline: 13.41 sec; 11-22-23 - 10.22 secs without UE support from chair Goal status: Goal met 11-22-23   LONG TERM GOALS: Target date: 12/17/2023  Pt will be independent with HEP for improved balance, gait. Baseline:  Goal status: INITIAL  2.  Pt will improve FGA score to at least 23/30 to decrease fall risk. Baseline: 16/30 Goal status: INITIAL  3.  Pt will improve gait velocity to at least 2.62 ft/sec for improved gait efficiency and safety. Baseline: 2.44 ft/sec Goal status: INITIAL  4.  Pt will verbalize understanding of fall prevention in home environment. Baseline:  Goal status: INITIAL  5.  Pt will verbalize plans for continued community fitness upon d/c from PT, to  maximize gains made in PT. Baseline:  Goal status: INITIAL#   ASSESSMENT:  CLINICAL IMPRESSION: Today's skilled session focused on adding corner balance to HEP with head motions/EC and working on further balance tasks on compliant surfaces with working on visual scanning to the L side. Pt fatigues easily with exercises, esp when adding a visual component. Pt with incr sway with corner balance tasks, but with practice able to use appropriate ankle balance strategy to maintain. Pt's balance continues to be affected by visual  impairments. Will continue per POC.   OBJECTIVE IMPAIRMENTS: Abnormal gait, decreased balance, decreased mobility, difficulty walking, and decreased strength.   ACTIVITY LIMITATIONS: transfers and locomotion level  PARTICIPATION LIMITATIONS: driving, community activity, and fitness/leisure activities  PERSONAL FACTORS: 3+ comorbidities: PMH above are also affecting patient's functional outcome.   REHAB POTENTIAL: Good  CLINICAL DECISION MAKING: Stable/uncomplicated  EVALUATION COMPLEXITY: Low  PLAN:  PT FREQUENCY: 2x/week (decreased to 1x/week on 11-22-23)  PT DURATION: 6 weeks plus eval  PLANNED INTERVENTIONS: 97750- Physical Performance Testing, 97110-Therapeutic exercises, 97530- Therapeutic activity, [id number]- Neuromuscular re-education, 97535- Self Care, [id number]- Gait training, Patient/Family education, and Balance training  PLAN FOR NEXT SESSION: goals due at next session (due on the 10th)   dynamic gait and balance - activities to incorporate visual scanning, esp. On Lt side   Sheffield LOISE Senate, PT, DPT 12/06/2023, 11:00 AM   Referring diagnosis?  Z86.73 (ICD-10-CM) - History of stroke  H54.7 (ICD-10-CM) - Vision loss  Z92.89 (ICD-10-CM) - History of recent hospitalization   Treatment diagnosis? (if different than referring diagnosis) R 26.81, R 26.89, M62.81 What was this (referring dx) caused by? []  Surgery []  Fall []  Ongoing issue []  Arthritis [x]  Other: ____________  Laterality: []  Rt []  Lt [x]  Both  Check all possible CPT codes:  *CHOOSE 10 OR LESS*    See Planned Interventions listed in the Plan section of the Evaluation.

## 2023-12-07 ENCOUNTER — Encounter: Payer: Self-pay | Admitting: Student in an Organized Health Care Education/Training Program

## 2023-12-07 ENCOUNTER — Encounter: Payer: Self-pay | Admitting: Speech Pathology

## 2023-12-07 ENCOUNTER — Ambulatory Visit: Admitting: Speech Pathology

## 2023-12-07 DIAGNOSIS — H5410 Blindness, one eye, low vision other eye, unspecified eyes: Secondary | ICD-10-CM | POA: Diagnosis not present

## 2023-12-07 DIAGNOSIS — H53462 Homonymous bilateral field defects, left side: Secondary | ICD-10-CM | POA: Diagnosis not present

## 2023-12-07 DIAGNOSIS — F1020 Alcohol dependence, uncomplicated: Secondary | ICD-10-CM | POA: Diagnosis not present

## 2023-12-07 DIAGNOSIS — F902 Attention-deficit hyperactivity disorder, combined type: Secondary | ICD-10-CM | POA: Diagnosis not present

## 2023-12-07 DIAGNOSIS — R41841 Cognitive communication deficit: Secondary | ICD-10-CM | POA: Diagnosis not present

## 2023-12-07 DIAGNOSIS — M6281 Muscle weakness (generalized): Secondary | ICD-10-CM | POA: Diagnosis not present

## 2023-12-07 DIAGNOSIS — I639 Cerebral infarction, unspecified: Secondary | ICD-10-CM | POA: Diagnosis not present

## 2023-12-07 DIAGNOSIS — R2681 Unsteadiness on feet: Secondary | ICD-10-CM | POA: Diagnosis not present

## 2023-12-07 DIAGNOSIS — R41842 Visuospatial deficit: Secondary | ICD-10-CM | POA: Diagnosis not present

## 2023-12-07 DIAGNOSIS — F321 Major depressive disorder, single episode, moderate: Secondary | ICD-10-CM | POA: Diagnosis not present

## 2023-12-07 DIAGNOSIS — R2689 Other abnormalities of gait and mobility: Secondary | ICD-10-CM | POA: Diagnosis not present

## 2023-12-07 DIAGNOSIS — I69815 Cognitive social or emotional deficit following other cerebrovascular disease: Secondary | ICD-10-CM | POA: Diagnosis not present

## 2023-12-07 NOTE — Patient Instructions (Signed)
   Put in October appointments  You brain is healing - it's OK to use some supports like calendars, notebooks, organization while you are healing  3 ring binder with sections for PT/OT/Speech

## 2023-12-07 NOTE — Therapy (Addendum)
 OUTPATIENT SPEECH LANGUAGE PATHOLOGY EVALUATION   Patient Name: Terry Hays MRN: [id number] DOB:1950-08-31, 73 y.o., male Today's Date: 12/07/2023  PCP: Joyce Rush, MD REFERRING PROVIDER: Bulah Alm RIGGERS  END OF SESSION:  End of Session - 12/07/23 1029     Visit Number 2    Date for Recertification  02/04/24    SLP Start Time 1029   arrived late   SLP Stop Time  1108    SLP Time Calculation (min) 39 min    Activity Tolerance Patient tolerated treatment well          Past Medical History:  Diagnosis Date   Abnormal ECG    Allergy    Anxiety    FLYING   Diverticulosis    Dyslipidemia    History of prostate cancer    Hyperlipidemia    Hypertension    LVH (left ventricular hypertrophy)    Pre-diabetes    Past Surgical History:  Procedure Laterality Date   COLONOSCOPY  2004 2011   MAGOD   EYE SURGERY  03/10/1999   CATOACTS BOTH   HERNIA REPAIR  03/09/1952   R INGHINAL   INGUINAL HERNIA REPAIR Left 04/07/2018   Procedure: OPERN LEFT INGUINAL HERNIA REPAIR WITH MESH ERAS PATHWAY;  Surgeon: Tanda Locus, MD;  Location: [facility];  Service: General;  Laterality: Left;   LOOP RECORDER INSERTION N/A 09/17/2023   Procedure: LOOP RECORDER INSERTION;  Surgeon: Lesia Ozell Barter, PA-C;  Location: [facility] INVASIVE CV LAB;  Service: Cardiovascular;  Laterality: N/A;   PENILE PROSTHESIS IMPLANT  03/09/2009   DAHLSTEDT   PROSTATE SURGERY  03/09/2001   PROSTRATECTOMY   Patient Active Problem List   Diagnosis Date Noted   Coronary artery disease involving native coronary artery of native heart without angina pectoris 12/03/2023   History of CVA (cerebrovascular accident) 11/06/2023   Cognitive impairment 09/12/2023   Right lower lobe pulmonary nodule 09/08/2023   Attention deficit hyperactivity disorder (ADHD), predominantly hyperactive type 06/03/2021   Cherry angioma 06/03/2021   Seborrheic keratosis 06/03/2021   Arthritis 03/17/2018   Spinal stenosis of  lumbosacral region 03/17/2018   Actinic keratosis 03/17/2018   Degeneration of lumbar intervertebral disc 10/27/2017   History of prostate cancer 09/15/2011   Fear of flying 09/15/2011   Family history of heart disease in male family member before age 49 09/15/2011   Allergic rhinitis 09/15/2011   Hemorrhoids 09/15/2011   Hypertension 06/27/2010   Dyslipidemia    LVH (left ventricular hypertrophy)     ONSET DATE: 09/08/23   REFERRING DIAG: S13.26 (ICD-10-CM) - Personal history of transient ischemic attack (TIA), and cerebral infarction without residual deficits Z92.89 (ICD-10-CM) - Personal history of other medical treatment H54.7 (ICD-10-CM) - Unspecified visual loss  THERAPY DIAG:  Cognitive communication deficit  Rationale for Evaluation and Treatment: Rehabilitation  SUBJECTIVE:   SUBJECTIVE STATEMENT: I'm sorry I'm late, I had to walk here arrived 15 minutes late  Pt accompanied by: significant other - Terry Hays  PERTINENT HISTORY:  73yo male admitted 09/08/23 with confusion, left visual field cut. PMH: HTN, HLD, prediabetes, prostate cancer, diverticulosis. MRI - mod-lg (sub)acute right PCA infarct in the medial right occipital lobe. Small chronic infarcts left cerebellum.   PAIN:  Are you having pain? No  FALLS: Has patient fallen in last 6 months?  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  PATIENT GOALS: Improvement in memory   OBJECTIVE:  Note: Objective measures were completed at Evaluation unless otherwise noted.  DIAGNOSTIC FINDINGS:  SLE - 09/09/23 Pt seen for cognitive linguistic evaluation at bedside. Pt was awake and alert. Wife, son, and daughter present during assessment. Pt reports PhD in  Sociology, independent with finances and medications prior to admit. Family reports concern regarding decline in attention and working memory prior to admit. Pt speech is fully intelligible. Receptive and Expressive Language are intact. Pt scored 21/30 on the [employer] Mental Status (SLUMS) Examination, indicating mild neurocognitive deficits. Pt exhibited difficulty with delayed recall, attention, mental math (problem solving), auditory attention/recall, and clock drawing (executive functions). Recommend skilled ST intervention at next venue of care to maximize independence and safety and minimize caregiver burden. Pt/family and medical team informed of results and recommendations.  MRI WO CONTRAST: 09/08/23 IMPRESSION: Moderate to large acute/subacute right PCA territory infarct largely involving the medial right occipital lobe. Small chronic infarcts left cerebellum. Age-related change.   COGNITION: Overall cognitive status: Impaired and History of cognitive impairments - at baseline; pt with premorbid dx of ADHD - was on Vyvanse but now has been removed. Pt to speak with psychiatrist about replacement med for attention tomorrow. Areas of impairment:  Attention: Impaired: Sustained, Selective, Alternating, Divided, Comment: SLP wonders if there would have been a difference in today's testing had pt been medicated for ADHD Memory: Impaired: Short term Awareness: Impaired: Intellectual, emergent. Pt was unaware of errors made in symbol cancellation task (I was just looking for the tentacles, pt stated), generative naming (Yeah, I forgot about the capital M didn't I?), and in symbol trails task (Oh, I didn't even see those over there) until SLP cued him that he made errors, and cues to see them. Some splinter skills in anticipatory awareness however pt did not ever look to left of paper in tasks unless cued to do so by SLP.   Functional deficits: Terry Hays told SLP, I can tell him  something and 5 minutes later he asks me about it again. Pt states he has difficulty remembering where he placed his cane.    PATIENT REPORTED OUTCOME MEASURES (PROM): Cognitive Function: 75/140. Began PROM with Terry Hays alone - he rated no difficulty for first 3 items. Terry Hays arrived and she rated a lot of difficulty indicating continued reduced intellectual awareness of impairments. Due to this, scores were averaged between [place]. They rated a 2 or a lot of difficulty or often, 1x a day, having difficulty keeping track of time, bill paying, managing time, planning activities, getting organized, learning new tasks, multi tasking, keeping track when interrupted, slow thinking, working hard to pay attention when interrupted. They rated a 1 or having difficulty very often/several times a day with trouble concentrating. PMH + ADHD noted  TREATMENT DATE:   11/10/23: Pt arrived 15 minutes late. Cognitive Function PROM completed. See above. Initiated compensatory strategy of using the calendar on his phone to recall and manage his appointments. Prior to CVA, Terry Hays used notes in his phone, however due to increase of appointments s/p CVA, he required max A to demonstrate efficient use of this strategy. With extended time consistently, Terry Hays added 2 OT appointments and 1 ST appointment. Instructed him to add October appointments to his phone. Terry Hays reports inconsistently completing OT/PT HEP - Terry Hays is managing his rehab handouts - Initiated use of notebook with dividers for each discipline to support attention and organization for completion of HEP's and follow up with therapy recommendations. They will return with notebook.   PATIENT EDUCATION: Education details: Results thus far in evaluation, will complete eval next session, suggest psychiatrist work with pt ASAP to find attention med for him,  expected sx from CVA was located. Person educated: Patient and SO Education method: Explanation Education comprehension: verbalized understanding and needs further education   GOALS: Goals reviewed with patient? No  SHORT TERM GOALS: Target date: 01/07/24  Pt will complete CLQT in first 2 sessions Baseline: Goal status: DEFERRED - CLQT initiated at a different clinic -  2.  In cognitive linguistic written tasks pt will look to lt to complete with nonverbal cues to achieve 100% success in 3 sessions Baseline:  Goal status: ONGOING  3.  Pt will tell SLP 2 deficit areas without cues in 2 sessions Baseline:  Goal status: ONGOING  4.  Pt will tell SLP 3 functional memory strategies in 3 sessions Baseline:  Goal status: ONGOING    LONG TERM GOALS: Target date: 02/04/24  Pt will improve PROM from initial administration Baseline:  Goal status: ONGOING  2.  Pt will use 2 trained memory strategies in/between three sessions Baseline:  Goal status: ONGOING  3.  Pt will complete mod complex/complex written cognitive linguistic tasks with 100% given double checking and self correction in 3 sessions Baseline:  Goal status: ONGOING   ASSESSMENT:  CLINICAL IMPRESSION: Patient is a 73 y.o. M who was seen today for assessment of cognitive linguistics after CVA 09/08/23. Pt with premorbid dx of ADHD who was NOT medicated today. Pt also with lt inattention who req'd cues for looking to lt in evaluation tasks as he did not do so independently. He demonstrated memory deficits as well as awareness deficits, and also attention deficits however SLP unsure at this time how pt's performacne would have been with this aspect had he been medicated for ADHD.  Pt to talk with psychiatrist tomorrow - SLP strongly encouraged pt to obtain information from that professional tomorrow for assistance with meds for attention. For more details please see cognition above. If ascertained pt's attention skills are  worse than while medicated prior to CVA, SLP will add goal/s for attention.  OBJECTIVE IMPAIRMENTS: include attention, memory, awareness, and receptive language. These impairments are limiting patient from return to work, managing medications, managing appointments, managing finances, household responsibilities, ADLs/IADLs, and effectively communicating at home and in community. Factors affecting potential to achieve goals and functional outcome are ability to learn/carryover information and previous level of function.. Patient will benefit from skilled SLP services to address above impairments and improve overall function.  REHAB POTENTIAL: Good  PLAN:  SLP FREQUENCY: 2x/week  SLP DURATION: 8 weeks  PLANNED INTERVENTIONS: Cueing hierachy, Cognitive reorganization, Internal/external aids, Functional tasks, SLP instruction and feedback, Compensatory strategies, Patient/family education, and [id number] Treatment of  speech (30 or 45 min)     Helon Wisinski, Leita Caldron, CCC-SLP 12/07/2023, 11:29 AM

## 2023-12-08 ENCOUNTER — Encounter: Admitting: Occupational Therapy

## 2023-12-08 ENCOUNTER — Encounter: Payer: Self-pay | Admitting: Neurology

## 2023-12-08 ENCOUNTER — Ambulatory Visit

## 2023-12-08 ENCOUNTER — Ambulatory Visit: Admitting: Neurology

## 2023-12-08 VITALS — BP 123/70 | HR 65 | Ht 68.0 in | Wt 185.8 lb

## 2023-12-08 DIAGNOSIS — G3184 Mild cognitive impairment, so stated: Secondary | ICD-10-CM

## 2023-12-08 DIAGNOSIS — I639 Cerebral infarction, unspecified: Secondary | ICD-10-CM

## 2023-12-08 DIAGNOSIS — I63531 Cerebral infarction due to unspecified occlusion or stenosis of right posterior cerebral artery: Secondary | ICD-10-CM

## 2023-12-08 NOTE — Progress Notes (Signed)
 Letter sent to fax number below and fax transmission read success.

## 2023-12-08 NOTE — Patient Instructions (Signed)
 I had a long d/w patient , wife and son about his recent cryptogenic stroke, left-sided visual field defect, risk for recurrent stroke/TIAs, personally independently reviewed imaging studies and stroke evaluation results and answered questions.Continue aspirin  81 mg daily  for secondary stroke prevention and maintain strict control of hypertension with blood pressure goal below 130/90, diabetes with hemoglobin A1c goal below 6.5% and lipids with LDL cholesterol goal below 70 mg/dL. I also advised the patient to eat a healthy diet with plenty of whole grains, cereals, fruits and vegetables, exercise regularly and maintain ideal body weight .I recommend increase participate in cognitively challenging activities like solving crossword puzzles, playing bridge and sudoku.  We also discussed memory compensation strategies.  I advised him not to drive till his peripheral vision loss improves.  He is neurologically cleared to undergo colonoscopy and may hold aspirin  for a few days with a small but acceptable periprocedural risk of TIA/stroke if willing.  Followup in the future with my nurse practitioner in 6 to 8 months or call earlier if necessary.  Memory Compensation Strategies  Use WARM strategy.  W= write it down  A= associate it  R= repeat it  M= make a mental note  2.   You can keep a Glass blower/designer.  Use a 3-ring notebook with sections for the following: calendar, important names and phone numbers,  medications, doctors' names/phone numbers, lists/reminders, and a section to journal what you did  each day.   3.    Use a calendar to write appointments down.  4.    Write yourself a schedule for the day.  This can be placed on the calendar or in a separate section of the Memory Notebook.  Keeping a  regular schedule can help memory.  5.    Use medication organizer with sections for each day or morning/evening pills.  You may need help loading it  6.    Keep a basket, or pegboard by the  door.  Place items that you need to take out with you in the basket or on the pegboard.  You may also want to  include a message board for reminders.  7.    Use sticky notes.  Place sticky notes with reminders in a place where the task is performed.  For example:  turn off the  stove placed by the stove, lock the door placed on the door at eye level,  take your medications on  the bathroom mirror or by the place where you normally take your medications.  8.    Use alarms/timers.  Use while cooking to remind yourself to check on food or as a reminder to take your medicine, or as a  reminder to make a call, or as a reminder to perform another task, etc.
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
## 2023-12-09 ENCOUNTER — Encounter: Payer: Self-pay | Admitting: Occupational Therapy

## 2023-12-09 ENCOUNTER — Ambulatory Visit: Attending: Medical | Admitting: Occupational Therapy

## 2023-12-09 DIAGNOSIS — H53462 Homonymous bilateral field defects, left side: Secondary | ICD-10-CM | POA: Insufficient documentation

## 2023-12-09 DIAGNOSIS — R2681 Unsteadiness on feet: Secondary | ICD-10-CM | POA: Diagnosis not present

## 2023-12-09 DIAGNOSIS — R41842 Visuospatial deficit: Secondary | ICD-10-CM | POA: Diagnosis not present

## 2023-12-09 DIAGNOSIS — I639 Cerebral infarction, unspecified: Secondary | ICD-10-CM | POA: Insufficient documentation

## 2023-12-09 DIAGNOSIS — R41844 Frontal lobe and executive function deficit: Secondary | ICD-10-CM | POA: Insufficient documentation

## 2023-12-09 DIAGNOSIS — M6281 Muscle weakness (generalized): Secondary | ICD-10-CM | POA: Diagnosis not present

## 2023-12-09 DIAGNOSIS — R41841 Cognitive communication deficit: Secondary | ICD-10-CM | POA: Diagnosis present

## 2023-12-09 DIAGNOSIS — R2689 Other abnormalities of gait and mobility: Secondary | ICD-10-CM | POA: Diagnosis not present

## 2023-12-09 DIAGNOSIS — H5410 Blindness, one eye, low vision other eye, unspecified eyes: Secondary | ICD-10-CM | POA: Diagnosis present

## 2023-12-09 DIAGNOSIS — R278 Other lack of coordination: Secondary | ICD-10-CM | POA: Insufficient documentation

## 2023-12-09 DIAGNOSIS — R41840 Attention and concentration deficit: Secondary | ICD-10-CM | POA: Diagnosis not present

## 2023-12-09 NOTE — Therapy (Signed)
 OUTPATIENT OCCUPATIONAL THERAPY NEURO TREATMENT  Patient Name: Terry Hays MRN: [id number] DOB:1950-06-03, 73 y.o., male Today's Date: 12/09/2023  PCP: [name], MD REFERRING PROVIDER: Bulah Alm RAMAN, PA-C  END OF SESSION:  OT End of Session - 12/09/23 0851     Visit Number 7    Number of Visits 17    Date for Recertification  01/05/24    Authorization Type [employer] PPO--17 visits approved 11/10/23-01/05/24    Authorization - Visit Number 7    Authorization - Number of Visits 17    Progress Note Due on Visit 10    OT Start Time 0845    OT Stop Time 0930    OT Time Calculation (min) 45 min    Activity Tolerance Patient tolerated treatment well    Behavior During Therapy [facility] for tasks assessed/performed             Past Medical History:  Diagnosis Date   Abnormal ECG    Allergy    Anxiety    FLYING   Diverticulosis    Dyslipidemia    History of prostate cancer    Hyperlipidemia    Hypertension    LVH (left ventricular hypertrophy)    Pre-diabetes    Past Surgical History:  Procedure Laterality Date   COLONOSCOPY  2004 2011   MAGOD   EYE SURGERY  03/10/1999   CATOACTS BOTH   HERNIA REPAIR  03/09/1952   R INGHINAL   INGUINAL HERNIA REPAIR Left 04/07/2018   Procedure: OPERN LEFT INGUINAL HERNIA REPAIR WITH MESH ERAS PATHWAY;  Surgeon: Tanda Locus, MD;  Location: [facility];  Service: General;  Laterality: Left;   LOOP RECORDER INSERTION N/A 09/17/2023   Procedure: LOOP RECORDER INSERTION;  Surgeon: Lesia Ozell Barter, PA-C;  Location: [facility] INVASIVE CV LAB;  Service: Cardiovascular;  Laterality: N/A;   PENILE PROSTHESIS IMPLANT  03/09/2009   DAHLSTEDT   PROSTATE SURGERY  03/09/2001   PROSTRATECTOMY   Patient Active Problem List   Diagnosis Date Noted   Coronary artery disease involving native coronary artery of native heart without angina pectoris 12/03/2023   History of CVA (cerebrovascular accident) 11/06/2023   Cognitive  impairment 09/12/2023   Right lower lobe pulmonary nodule 09/08/2023   Attention deficit hyperactivity disorder (ADHD), predominantly hyperactive type 06/03/2021   Cherry angioma 06/03/2021   Seborrheic keratosis 06/03/2021   Arthritis 03/17/2018   Spinal stenosis of lumbosacral region 03/17/2018   Actinic keratosis 03/17/2018   Degeneration of lumbar intervertebral disc 10/27/2017   History of prostate cancer 09/15/2011   Fear of flying 09/15/2011   Family history of heart disease in male family member before age 4 09/15/2011   Allergic rhinitis 09/15/2011   Hemorrhoids 09/15/2011   Hypertension 06/27/2010   Dyslipidemia    LVH (left ventricular hypertrophy)     ONSET DATE: late June 2025  REFERRING DIAG: Z86.73, Z92.89, H54.7  THERAPY DIAG:  Muscle weakness (generalized)  Unsteadiness on feet  Visuospatial deficit  Attention and concentration deficit  Frontal lobe and executive function deficit  Rationale for Evaluation and Treatment: Rehabilitation  SUBJECTIVE:   SUBJECTIVE STATEMENT: Pt reports he saw doctor yesterday (Dr. [name]) and he told me I couldn't drive Denies pain and no falls  Pt accompanied by: self and friend  PERTINENT HISTORY: CVA 09/08/23, hx of falls  PRECAUTIONS: None  WEIGHT BEARING RESTRICTIONS: No  PAIN:  Are you having pain? No  FALLS: Has patient fallen in last 6 months? Yes. Number of falls  unclear, but fell a couple weeks ago  LIVING ENVIRONMENT: Lives with: lives with their spouse Lives in: House/apartment Stairs: Yes: Internal: unclear but has a rail for internal steps steps; steps to enter home with rail and rail doing downstairs Has following equipment at home: Single point cane  PLOF: Independent, Independent with basic ADLs, Independent with household mobility without device, and Independent with transfers  PATIENT GOALS: Pt would like to be able use his computer again for email, typing in word, etc. Would also love  to be able to golf, play pickle ball etc. Ultimate goal is to drive.  OBJECTIVE:  Note: Objective measures were completed at Evaluation unless otherwise noted.  HAND DOMINANCE: Left  ADLs: Overall ADL: Mod I at this time, using a SPC for mobility.   IADLs: Overall pt is not driving and has assist for grocery shopping, bills, cooking, IADLs at this time.  Handwriting: 90% legible  MOBILITY STATUS: Hx of falls and needs SPC  POSTURE COMMENTS:  rounded shoulders Sitting balance: decresaed core strength noted but overall demonstrates functional sitting balance.   ACTIVITY TOLERANCE: Activity tolerance: decreased but functional for ADL tasks, not able to do IADL tasks such as golf and pickleball  FUNCTIONAL OUTCOME MEASURES: NA - pt truly does not feel like he has LUE deficits from CVA and wants to work on vision  UPPER EXTREMITY ROM:    Active ROM Right eval Left eval  Shoulder flexion    Shoulder abduction    Shoulder adduction    Shoulder extension    Shoulder internal rotation    Shoulder external rotation    Elbow flexion    Elbow extension    Wrist flexion    Wrist extension    Wrist ulnar deviation    Wrist radial deviation    Wrist pronation    Wrist supination    (Blank rows = not tested)  UPPER EXTREMITY MMT:     MMT Right eval Left eval  Shoulder flexion 150 WFL  Shoulder abduction    Shoulder adduction    Shoulder extension    Shoulder internal rotation    Shoulder external rotation    Middle trapezius    Lower trapezius    Elbow flexion    Elbow extension    Wrist flexion    Wrist extension    Wrist ulnar deviation    Wrist radial deviation    Wrist pronation    Wrist supination    (Blank rows = not tested)  Note mild deficits in B shoulders for shoulder flexion 2/2 old injuries from falls   HAND FUNCTION: WFL - note pt verbose and arrived late to evaluation, did not have time to asses grip strength as pt wanted to focus on vision. Will  plan to assess in future sessions.   COORDINATION: Finger Nose Finger test: dysmetria and decreased coordination noted but unclear if this is from visual deficits to aim for other hand  11/25/23: 9 hole peg test: RT = 49.58 sec (slower d/t missing far Lt side and difficulty following directions even after multiple cues) Lt = 44.70 (difficulty following directions)   SENSATION: WFL  EDEMA: none  MUSCLE TONE: RUE: Within functional limits and LUE: Within functional limits  COGNITION: Overall cognitive status: Impaired and Difficulty to assess due to: Note pt does not think deficits are significant but statements from S.O. in room states that cognition has been limited for comprehension, short term memory, and higher level tasks.   VISION: Subjective report:  Pt reports he cannot see anything on the left - like its not even there. States he has learned various scanning and compensatory techniques.  Baseline vision: no glassess but demonstrates hemianopsia  Visual history: none prior to CVA   VISION ASSESSMENT: Impaired To be further assessed in functional context Eye alignment: Impaired:   Tracking/Visual pursuits: Decreased smoothness with horizontal tracking, Decreased smoothness with vertical tracking, Decreased smoothness of eye movement to Left superior field, Decreased smoothness of eye movement to Left inferior field, and Requires cues, head turns, or add eye shifts to track Visual Fields: Left homonymous hemianopsia  Patient has difficulty with following activities due to following visual impairments: driving, cooking, pickleball, golfing, has ad recent falls.   PERCEPTION: Impaired: Inattention/neglect: does not attend to left visual field  PRAXIS: Not tested  OBSERVATIONS: The pt is a 73 yo male who is accompanied by his S.O. this date walking with a SPC who demonstrates dignificant visual deficits post CVA. Pt demonstrates slight deficits in BUE shoulder ROM but is fairly  symmetrical and good MMT - does not feel like strength has been impacted from CVA from a neuro stand point. Pt with significant visual field deficits, L hemianopsia, and decreased attention to L visual field impacting skill performance.                                                                                                                             TREATMENT DATE:  12/09/23  Discussed rationale for why pt is NOT safe to drive currently - not just d/t visual limitations, but decreased awareness and attention especially to Lt side, decreased processing speed and reaction time, and decreased ability to multi-task visual information  Further assessed progress towards remaining STG's  Reviewed visual scanning strategies w/ pt and he confused visual scanning activities w/ strategies.  Worked specifically on STG #3 at computer:  Therapist first showed pt some accommodations that can be done on computer through accessories then accessibility tab to control text size, use of magnifier, high contrast, etc.  Pt then opened Word document w/ cues to scan Lt to find tab needed and to find file to print, etc. Pt wrote letter w/ cues to correct mistakes, use Lt hand, proper use of mouse, etc. Pt required extra time to type 3 sentences.  Pt then required cues to scan for google internet tab located on Lt side of computer and then to open email - however pt could not remember password to get into email.  Wife came back at end of session and instructed pt to work on this at home including use of Word, sending/responding to emails, etc. Wife also shown accessibility features.   PATIENT EDUCATION: Education details:  see above for cueing/reinforced use of visual compensation strategies (organized scanning pattern, use of edges, use of contrast, head turns, double checking) Person educated: Patient and friend Education method: Explanation, Demonstration, Verbal cues, and Handouts Education comprehension:  verbalized understanding, returned demonstration,  verbal cues required, and needs further education  HOME EXERCISE PROGRAM: 11/22/23:  Visual HEP and compensation strategies  11/25/23: coordination HEP    GOALS: Goals reviewed with patient? Yes  SHORT TERM GOALS: Target date: 12/08/23  Pt will be Supervision with vision specific HEP to further integrate strategies and scanning into daily life for max recovery and benefit.  Baseline: Pt does incooperate techniques from hospital but would benefit from further training  Goal status: PARTIALLY MET  11/26/23, educated, but needs min cueing/review at times  2.  Pt will demonstrate improved visual scanning to locate 5 items in a room with no more than 2 verbal cues to locate items.  Baseline:  5 items, needing 3+ cues for scanning Goal status: Met in min distracting environment.  12/06/23  3.  Pt will demonstrate improved visual acuity and/or use of adaptations to use his personal computer for leisure activities such as email, typing in Word, etc with no more than Supervision.  Baseline: Pt reports significant difficulty with this Goal status: IN PROGRESS (12/09/23 - Needed mod cueing to send email, use word, etc)   4.  Pt will perform a visual scanning task/worksheet of choice with no more than 2 errors in order to demonstrate improved scanning and acuity.  Goal status: Ongoing 12/06/23 1.58M scanning sheet with 7 errors (93%)     LONG TERM GOALS: Target date: 01/05/24  Pt will be MOD I with vision specific HEP to further integrate strategies and scanning into daily life for max recovery and benefit.  Baseline: Pt does incooperate techniques from hospital but would benefit from further training  Goal status: INITIAL  2.  Pt will demonstrate improved visual scanning to locate 5 items in a room with no verbal cues to locate items.  Baseline:  5 items, needing 3+ cues for scanning Goal status: INITIAL  3.  Pt will demonstrate improved visual  acuity and/or use of adaptations to use his personal computer for leisure activities such as email, typing in Word, etc with Independence. Baseline: Pt reports significant difficulty with this Goal status: INITIAL  4.  If safe and appropriate, pt will report a return to some leisure activities such as modified golf, pickle ball, walking, etc with use of strategies, AE, or compensatory techniques to improve safety and participation.  Baseline: not safe and not performing at this time Goal status: INITIAL    ASSESSMENT:  CLINICAL IMPRESSION: Pt is progressing towards goals with improved use visual compensation strategies.  Pt will benefit from reinforcement of visual strategies in functional tasks for transference of skills. Pt also needs reinforcement of strategies for functional computer use.   PERFORMANCE DEFICITS: in functional skills including ADLs, IADLs, proprioception, endurance, and vision, cognitive skills including attention, memory, problem solving, safety awareness, and sequencing, and psychosocial skills including coping strategies, environmental adaptation, habits, and routines and behaviors.   IMPAIRMENTS: are limiting patient from ADLs, IADLs, leisure, and social participation.   CO-MORBIDITIES: has no other co-morbidities that affects occupational performance. Patient will benefit from skilled OT to address above impairments and improve overall function.  MODIFICATION OR ASSISTANCE TO COMPLETE EVALUATION: Min-Moderate modification of tasks or assist with assess necessary to complete an evaluation.  OT OCCUPATIONAL PROFILE AND HISTORY: Detailed assessment: Review of records and additional review of physical, cognitive, psychosocial history related to current functional performance.  CLINICAL DECISION MAKING: Moderate - several treatment options, min-mod task modification necessary  REHAB POTENTIAL: Good  EVALUATION COMPLEXITY: Moderate   PLAN:  OT FREQUENCY:  2x/week  OT DURATION: 8 weeks  PLANNED INTERVENTIONS: 97168 OT Re-evaluation, 97535 self care/ADL training, 02889 therapeutic exercise, 97530 therapeutic activity, and 97112 neuromuscular re-education  RECOMMENDED OTHER SERVICES: NA  CONSULTED AND AGREED WITH PLAN OF CARE: Patient and family member/caregiver  PLAN FOR NEXT SESSION:  check remaining STGs, continue computer practice, continue to review and practice visual compensation strategies in functional tasks, environmental scanning, tabletop scanning activities (try matching analogue and digital times)   Burnard JINNY Roads, OTR/L 12/09/2023, 8:51 AM

## 2023-12-10 NOTE — Progress Notes (Addendum)
 Spoke to patient and instructed them to come at 0830  and to be NPO after 0000.     Confirmed that patient will have a ride home and someone to stay with them for 24 hours after the procedure.   Medications reviewed.

## 2023-12-13 ENCOUNTER — Encounter: Admitting: Occupational Therapy

## 2023-12-13 ENCOUNTER — Ambulatory Visit (HOSPITAL_COMMUNITY)
Admission: RE | Admit: 2023-12-13 | Discharge: 2023-12-13 | Disposition: A | Source: Ambulatory Visit | Attending: Student in an Organized Health Care Education/Training Program | Admitting: Student in an Organized Health Care Education/Training Program

## 2023-12-13 ENCOUNTER — Encounter (HOSPITAL_COMMUNITY)
Admission: RE | Disposition: A | Discharge status: Home / Self Care | Payer: Self-pay | Source: Home / Self Care | Attending: Student in an Organized Health Care Education/Training Program

## 2023-12-13 ENCOUNTER — Encounter

## 2023-12-13 ENCOUNTER — Ambulatory Visit: Admitting: Physical Therapy

## 2023-12-13 ENCOUNTER — Ambulatory Visit: Admitting: Occupational Therapy

## 2023-12-13 ENCOUNTER — Other Ambulatory Visit: Payer: Self-pay

## 2023-12-13 ENCOUNTER — Ambulatory Visit (HOSPITAL_COMMUNITY)
Admission: RE | Admit: 2023-12-13 | Discharge: 2023-12-13 | Disposition: A | Attending: Student in an Organized Health Care Education/Training Program | Admitting: Student in an Organized Health Care Education/Training Program

## 2023-12-13 ENCOUNTER — Ambulatory Visit (HOSPITAL_COMMUNITY): Admitting: Certified Registered"

## 2023-12-13 ENCOUNTER — Ambulatory Visit

## 2023-12-13 DIAGNOSIS — Z8673 Personal history of transient ischemic attack (TIA), and cerebral infarction without residual deficits: Secondary | ICD-10-CM | POA: Diagnosis not present

## 2023-12-13 DIAGNOSIS — Z7982 Long term (current) use of aspirin: Secondary | ICD-10-CM | POA: Insufficient documentation

## 2023-12-13 DIAGNOSIS — Z87891 Personal history of nicotine dependence: Secondary | ICD-10-CM

## 2023-12-13 DIAGNOSIS — I119 Hypertensive heart disease without heart failure: Secondary | ICD-10-CM | POA: Diagnosis not present

## 2023-12-13 DIAGNOSIS — Z79899 Other long term (current) drug therapy: Secondary | ICD-10-CM | POA: Diagnosis not present

## 2023-12-13 DIAGNOSIS — Q2112 Patent foramen ovale: Secondary | ICD-10-CM | POA: Insufficient documentation

## 2023-12-13 DIAGNOSIS — I340 Nonrheumatic mitral (valve) insufficiency: Secondary | ICD-10-CM | POA: Diagnosis not present

## 2023-12-13 DIAGNOSIS — I639 Cerebral infarction, unspecified: Secondary | ICD-10-CM

## 2023-12-13 DIAGNOSIS — I2510 Atherosclerotic heart disease of native coronary artery without angina pectoris: Secondary | ICD-10-CM | POA: Insufficient documentation

## 2023-12-13 DIAGNOSIS — E785 Hyperlipidemia, unspecified: Secondary | ICD-10-CM | POA: Diagnosis not present

## 2023-12-13 DIAGNOSIS — I10 Essential (primary) hypertension: Secondary | ICD-10-CM

## 2023-12-13 HISTORY — PX: TRANSESOPHAGEAL ECHOCARDIOGRAM (CATH LAB): EP1270

## 2023-12-13 LAB — ECHO TEE

## 2023-12-13 SURGERY — TRANSESOPHAGEAL ECHOCARDIOGRAM (TEE) (CATHLAB)
Anesthesia: Monitor Anesthesia Care

## 2023-12-13 MED ORDER — LIDOCAINE 2% (20 MG/ML) 5 ML SYRINGE
INTRAMUSCULAR | Status: DC | PRN
  Administered 2023-12-13: 80 mg via INTRAVENOUS

## 2023-12-13 MED ORDER — PROPOFOL 500 MG/50ML IV EMUL
INTRAVENOUS | Status: DC | PRN
  Administered 2023-12-13: 100 ug/kg/min via INTRAVENOUS

## 2023-12-13 MED ORDER — SODIUM CHLORIDE 0.9 % IV SOLN: INTRAVENOUS | Status: DC

## 2023-12-13 NOTE — CV Procedure (Signed)
    TRANSESOPHAGEAL ECHOCARDIOGRAM   NAME:  LAURANCE HEIDE    MRN: [id number] DOB:  [birth year]    ADMIT DATE: 12/13/2023  INDICATIONS: CVA  PROCEDURE:   Informed consent was obtained prior to the procedure. The risks, benefits and alternatives for the procedure were discussed and the patient comprehended these risks.  Risks include, but are not limited to, cough, sore throat, vomiting, nausea, somnolence, esophageal and stomach trauma or perforation, bleeding, low blood pressure, aspiration, pneumonia, infection, trauma to the teeth and death.    Procedural time out performed. The oropharynx was anesthetized with viscous lidocaine .  Anesthesia was administered by the anaesthesilogy team. The patient's heart rate, blood pressure, and oxygen saturation were monitored continuously during the procedure.  The transesophageal probe was inserted in the esophagus and stomach without difficulty and multiple views were obtained.  The patient tolerated the procedure well.  COMPLICATIONS:    There were no immediate complications.  KEY FINDINGS:  Positive bubble study by color flow doppler and agitated saline. Mild MR Normal LV/RV systolic function  Full report to follow.   Zanden Colver T. Floretta HEATH, MD [place]  [facility]  10:02 AM

## 2023-12-13 NOTE — Transfer of Care (Signed)
 Immediate Anesthesia Transfer of Care Note  Patient: Terry Hays  Procedure(s) Performed: TRANSESOPHAGEAL ECHOCARDIOGRAM  Patient Location: PACU  Anesthesia Type:MAC  Level of Consciousness: awake and drowsy  Airway & Oxygen Therapy: Patient Spontanous Breathing and Patient connected to nasal cannula oxygen  Post-op Assessment: Report given to RN and Post -op Vital signs reviewed and stable  Post vital signs: Reviewed and stable  Last Vitals:  Vitals Value Taken Time  BP 94/54 12/13/23 10:00  Temp 36.4 C 12/13/23 10:00  Pulse 46 12/13/23 10:03  Resp 12 12/13/23 10:03  SpO2 93 % 12/13/23 10:03  Vitals shown include unfiled device data.  Last Pain:  Vitals:   12/13/23 1000  TempSrc: Tympanic  PainSc: Asleep         Complications: No notable events documented.

## 2023-12-13 NOTE — Anesthesia Preprocedure Evaluation (Signed)
 Anesthesia Evaluation  Patient identified by MRN, date of birth, ID band Patient awake    Reviewed: Allergy & Precautions, NPO status , Patient's Chart, lab work & pertinent test results  History of Anesthesia Complications Negative for: history of anesthetic complications  Airway Mallampati: II       Dental  (+) Teeth Intact, Dental Advisory Given   Pulmonary sleep apnea , neg COPD, former smoker   breath sounds clear to auscultation       Cardiovascular hypertension, + CAD   Rhythm:Regular Rate:Bradycardia  TTE (2025): 1. Left ventricular ejection fraction, by estimation, is 60 to 65%. The  left ventricle has normal function. The left ventricle has no regional  wall motion abnormalities. There is moderate concentric left ventricular  hypertrophy. Left ventricular  diastolic parameters are indeterminate.   2. Right ventricular systolic function is normal. The right ventricular  size is normal. There is normal pulmonary artery systolic pressure.   3. Left atrial size was moderately dilated.   4. The mitral valve is normal in structure. Trivial mitral valve  regurgitation. No evidence of mitral stenosis.   5. The aortic valve is tricuspid. Aortic valve regurgitation is not  visualized. No aortic stenosis is present.   6. The inferior vena cava is normal in size with greater than 50%  respiratory variability, suggesting right atrial pressure of 3 mmHg.   Comparison(s): Prior images unable to be directly viewed, comparison made  by report only.     Neuro/Psych Peripheral Vision Loss 2/2 CVA CVA    GI/Hepatic   Endo/Other    Renal/GU      Musculoskeletal  (+) Arthritis ,    Abdominal   Peds  Hematology   Anesthesia Other Findings   Reproductive/Obstetrics                              Anesthesia Physical Anesthesia Plan  ASA: 3  Anesthesia Plan: MAC   Post-op Pain Management:     Induction: Intravenous  PONV Risk Score and Plan: 1 and Treatment may vary due to age or medical condition and Propofol  infusion  Airway Management Planned: Natural Airway  Additional Equipment:   Intra-op Plan:   Post-operative Plan: Extubation in OR  Informed Consent:      Dental advisory given  Plan Discussed with: CRNA and Surgeon  Anesthesia Plan Comments:         Anesthesia Quick Evaluation

## 2023-12-13 NOTE — Interval H&P Note (Signed)
 History and Physical Interval Note:  12/13/2023 9:25 AM  Terry Hays  has presented today for surgery, with the diagnosis of CRYPTOGENIC STROKE.  The various methods of treatment have been discussed with the patient and family. After consideration of risks, benefits and other options for treatment, the patient has consented to  Procedure(s): TRANSESOPHAGEAL ECHOCARDIOGRAM (N/A) as a surgical intervention.  The patient's history has been reviewed, patient examined, no change in status, stable for surgery.  I have reviewed the patient's chart and labs.  Questions were answered to the patient's satisfaction.     [name]

## 2023-12-13 NOTE — Anesthesia Postprocedure Evaluation (Signed)
 Anesthesia Post Note  Patient: Terry Hays  Procedure(s) Performed: TRANSESOPHAGEAL ECHOCARDIOGRAM     Patient location during evaluation: Other Anesthesia Type: MAC Level of consciousness: awake Pain management: pain level controlled Vital Signs Assessment: post-procedure vital signs reviewed and stable Respiratory status: spontaneous breathing and nonlabored ventilation Cardiovascular status: blood pressure returned to baseline Postop Assessment: no apparent nausea or vomiting Anesthetic complications: no   No notable events documented.  Last Vitals:  Vitals:   12/13/23 1010 12/13/23 1020  BP: 104/63 132/67  Pulse: (!) 42 (!) 45  Resp: 12 10  Temp:    SpO2: 95% 95%    Last Pain:  Vitals:   12/13/23 1020  TempSrc:   PainSc: 0-No pain                 Lauraine KATHEE [place]

## 2023-12-13 NOTE — Discharge Instructions (Signed)

## 2023-12-13 NOTE — Progress Notes (Signed)
  Echocardiogram 2D Echocardiogram has been performed.  Koleen KANDICE Popper, RDCS 12/13/2023, 11:03 AM

## 2023-12-14 ENCOUNTER — Encounter (HOSPITAL_COMMUNITY): Payer: Self-pay | Admitting: Student in an Organized Health Care Education/Training Program

## 2023-12-14 ENCOUNTER — Other Ambulatory Visit: Payer: Self-pay | Admitting: Family Medicine

## 2023-12-14 ENCOUNTER — Telehealth: Payer: Self-pay

## 2023-12-14 DIAGNOSIS — K921 Melena: Secondary | ICD-10-CM

## 2023-12-14 NOTE — Telephone Encounter (Signed)
 Copied from CRM #[id number]. Topic: General - Other >> Dec 14, 2023  8:53 AM Treva T wrote: Reason for CRM: Received call from [facility] with [facility], [phone], calling on behalf of patient and a referral that was received.  Per caller, states Dr. Alvia at office, has reviewed the referral diagnosis and chart. States per provider is unable to treat for what the patient needs, recommends patient to return to be evaluated by Dr. Robinson.  Can call office if need to discuss further.

## 2023-12-15 ENCOUNTER — Encounter

## 2023-12-15 ENCOUNTER — Ambulatory Visit: Admitting: Speech Pathology

## 2023-12-15 ENCOUNTER — Encounter: Admitting: Occupational Therapy

## 2023-12-15 ENCOUNTER — Ambulatory Visit

## 2023-12-15 DIAGNOSIS — R41842 Visuospatial deficit: Secondary | ICD-10-CM | POA: Diagnosis not present

## 2023-12-15 DIAGNOSIS — H53462 Homonymous bilateral field defects, left side: Secondary | ICD-10-CM | POA: Diagnosis not present

## 2023-12-15 DIAGNOSIS — R2689 Other abnormalities of gait and mobility: Secondary | ICD-10-CM | POA: Diagnosis not present

## 2023-12-15 DIAGNOSIS — I639 Cerebral infarction, unspecified: Secondary | ICD-10-CM | POA: Diagnosis not present

## 2023-12-15 DIAGNOSIS — R278 Other lack of coordination: Secondary | ICD-10-CM | POA: Diagnosis not present

## 2023-12-15 DIAGNOSIS — M6281 Muscle weakness (generalized): Secondary | ICD-10-CM | POA: Diagnosis not present

## 2023-12-15 DIAGNOSIS — R2681 Unsteadiness on feet: Secondary | ICD-10-CM | POA: Diagnosis not present

## 2023-12-15 DIAGNOSIS — R41844 Frontal lobe and executive function deficit: Secondary | ICD-10-CM | POA: Diagnosis not present

## 2023-12-15 DIAGNOSIS — R41841 Cognitive communication deficit: Secondary | ICD-10-CM

## 2023-12-15 DIAGNOSIS — R41840 Attention and concentration deficit: Secondary | ICD-10-CM | POA: Diagnosis not present

## 2023-12-15 NOTE — Therapy (Signed)
 OUTPATIENT SPEECH LANGUAGE PATHOLOGY EVALUATION   Patient Name: Terry Hays MRN: [id number] DOB:1950/05/11, 73 y.o., male Today's Date: 12/15/2023  PCP: Joyce Rush, MD REFERRING PROVIDER: Bulah Alm RIGGERS  END OF SESSION:  End of Session - 12/15/23 1021     Visit Number 3    Number of Visits 17    Date for Recertification  02/04/24    SLP Start Time 1018    SLP Stop Time  1100    SLP Time Calculation (min) 42 min    Activity Tolerance Patient tolerated treatment well          Past Medical History:  Diagnosis Date   Abnormal ECG    Allergy    Anxiety    FLYING   Diverticulosis    Dyslipidemia    History of prostate cancer    Hyperlipidemia    Hypertension    LVH (left ventricular hypertrophy)    Pre-diabetes    Past Surgical History:  Procedure Laterality Date   COLONOSCOPY  2004 2011   MAGOD   EYE SURGERY  03/10/1999   CATOACTS BOTH   HERNIA REPAIR  03/09/1952   R INGHINAL   INGUINAL HERNIA REPAIR Left 04/07/2018   Procedure: OPERN LEFT INGUINAL HERNIA REPAIR WITH MESH ERAS PATHWAY;  Surgeon: Tanda Locus, MD;  Location: [facility];  Service: General;  Laterality: Left;   LOOP RECORDER INSERTION N/A 09/17/2023   Procedure: LOOP RECORDER INSERTION;  Surgeon: Lesia Ozell Barter, PA-C;  Location: [facility] INVASIVE CV LAB;  Service: Cardiovascular;  Laterality: N/A;   PENILE PROSTHESIS IMPLANT  03/09/2009   DAHLSTEDT   PROSTATE SURGERY  03/09/2001   PROSTRATECTOMY   TRANSESOPHAGEAL ECHOCARDIOGRAM (CATH LAB) N/A 12/13/2023   Procedure: TRANSESOPHAGEAL ECHOCARDIOGRAM;  Surgeon: Floretta Mallard, MD;  Location: [facility] INVASIVE CV LAB;  Service: Cardiovascular;  Laterality: N/A;   Patient Active Problem List   Diagnosis Date Noted   Coronary artery disease involving native coronary artery of native heart without angina pectoris 12/03/2023   History of CVA (cerebrovascular accident) 11/06/2023   Cognitive impairment 09/12/2023   Right lower lobe pulmonary  nodule 09/08/2023   Attention deficit hyperactivity disorder (ADHD), predominantly hyperactive type 06/03/2021   Cherry angioma 06/03/2021   Seborrheic keratosis 06/03/2021   Arthritis 03/17/2018   Spinal stenosis of lumbosacral region 03/17/2018   Actinic keratosis 03/17/2018   Degeneration of lumbar intervertebral disc 10/27/2017   History of prostate cancer 09/15/2011   Fear of flying 09/15/2011   Family history of heart disease in male family member before age 43 09/15/2011   Allergic rhinitis 09/15/2011   Hemorrhoids 09/15/2011   Hypertension 06/27/2010   Dyslipidemia    LVH (left ventricular hypertrophy)     ONSET DATE: 09/08/23   REFERRING DIAG: S13.26 (ICD-10-CM) - Personal history of transient ischemic attack (TIA), and cerebral infarction without residual deficits Z92.89 (ICD-10-CM) - Personal history of other medical treatment H54.7 (ICD-10-CM) - Unspecified visual loss  THERAPY DIAG:  Cognitive communication deficit  Rationale for Evaluation and Treatment: Rehabilitation  SUBJECTIVE:   SUBJECTIVE STATEMENT: I'm sorry I'm late, I had to walk here arrived 15 minutes late  Pt accompanied by: significant other - [name] attended last 10 minutes of session  PERTINENT HISTORY:  73yo male admitted 09/08/23 with confusion, left visual field cut. PMH: HTN, HLD, prediabetes, prostate cancer, diverticulosis. MRI - mod-lg (sub)acute right PCA infarct in the medial right occipital lobe. Small chronic infarcts left cerebellum.   PAIN:  Are you having pain? No  TREATMENT DATE:   12/15/23: Terry Hays has put his appointments in his phone and he organized his hutch to have a clean shelf with set places for his phone, wallet, keys - he reports he is not losing these items using this strategy. Trained in energy conservation strategies setting priorities for the week and day  a head - being mindful about what he wants to accomplish to spend his energy on. Compensatory strategies for attention generated including eliminating environmental distractions, having all supplies needed for a task out to avoid interruptions. Today, Terry Hays verbalized awareness of increased word finding difficulties with mod I , indicating improved awareness - targeted moderately complex naming task with rare min A 95% accuracy. See Patient Instructions  11/10/23: Pt arrived 15 minutes late. Cognitive Function PROM completed. See above. Initiated compensatory strategy of using the calendar on his phone to recall and manage his appointments. Prior to CVA, Terry Hays used notes in his phone, however due to increase of appointments s/p CVA, he required max A to demonstrate efficient use of this strategy. With extended time consistently, Terry Hays added 2 OT appointments and 1 ST appointment. Instructed him to add October appointments to his phone. Terry Hays reports inconsistently completing OT/PT HEP - [name] is managing his rehab handouts - Initiated use of notebook with dividers for each discipline to support attention and organization for completion of HEP's and follow up with therapy recommendations. They will return with notebook.   PATIENT EDUCATION: Education details: Results thus far in evaluation, will complete eval next session, suggest psychiatrist work with pt ASAP to find attention med for him, expected sx from CVA was located. Person educated: Patient and SO Education method: Explanation Education comprehension: verbalized understanding and needs further education   GOALS: Goals reviewed with patient? No  SHORT TERM GOALS: Target date: 01/07/24  Pt will complete CLQT in first 2 sessions Baseline: Goal status: DEFERRED - CLQT initiated at a different clinic -  2.  In cognitive linguistic written tasks pt will look to lt to complete with nonverbal cues to achieve 100% success in 3 sessions Baseline:  Goal  status: ONGOING  3.  Pt will tell SLP 2 deficit areas without cues in 2 sessions Baseline:  Goal status: ONGOING  4.  Pt will tell SLP 3 functional memory strategies in 3 sessions Baseline:  Goal status: ONGOING    LONG TERM GOALS: Target date: 02/04/24  Pt will improve PROM from initial administration Baseline:  Goal status: ONGOING  2.  Pt will use 2 trained memory strategies in/between three sessions Baseline:  Goal status: ONGOING  3.  Pt will complete mod complex/complex written cognitive linguistic tasks with 100% given double checking and self correction in 3 sessions Baseline:  Goal status: ONGOING   ASSESSMENT:  CLINICAL IMPRESSION: Patient is a 73 y.o. M who was seen today for assessment of cognitive linguistics after CVA 09/08/23. Pt with premorbid dx of ADHD who was NOT medicated today. Pt also with lt inattention who req'd cues for looking to lt in evaluation tasks as he did not do so independently. He demonstrated memory deficits as well as awareness deficits, and also attention deficits however SLP unsure at this time how pt's performacne would have been with this aspect had he been medicated for ADHD.  Pt to talk with psychiatrist tomorrow - SLP strongly encouraged pt to obtain information from that professional tomorrow for assistance with meds for attention. For more details please see cognition above. If ascertained pt's attention skills are worse than  while medicated prior to CVA, SLP will add goal/s for attention.  OBJECTIVE IMPAIRMENTS: include attention, memory, awareness, and receptive language. These impairments are limiting patient from return to work, managing medications, managing appointments, managing finances, household responsibilities, ADLs/IADLs, and effectively communicating at home and in community. Factors affecting potential to achieve goals and functional outcome are ability to learn/carryover information and previous level of function.. Patient  will benefit from skilled SLP services to address above impairments and improve overall function.  REHAB POTENTIAL: Good  PLAN:  SLP FREQUENCY: 2x/week  SLP DURATION: 8 weeks  PLANNED INTERVENTIONS: Cueing hierachy, Cognitive reorganization, Internal/external aids, Functional tasks, SLP instruction and feedback, Compensatory strategies, Patient/family education, and [id number] Treatment of speech (30 or 45 min)     Saagar Tortorella, Leita Caldron, CCC-SLP 12/15/2023, 12:09 PM

## 2023-12-15 NOTE — Patient Instructions (Signed)
   A good day starts the night before - check your calendar and have clothes, meds, food, notebook ready the night before  Give yourself more time to do basic tasks - things take longer after a stroke -   After the stroke - you have limited energy pennies to spend - you need to prioritize where  you are spending your pennies  Lots of sensory stimulation takes your pennies, your brain is filtering all the time and it takes more energy as it heals  Word finding is common after a stroke - say I know what it is, but I can't think of the word - it's ok to give clues to get help you know the round meat on a pizza  When you get ready to join the public - have a script - something like I've had a stroke, rehab is going well and I'm much better something like that   Tips to help facilitate better attention, concentration, focus   Do harder, longer tasks when you are most alert/awake  Break down larger tasks into small parts  Limit distractions of TV, radio, conversation, e mails/texts, appliance noise, etc - if a job is important, do it in a quiet room  Be aware of how you are functioning in high stimulation environments such as large stores, parties, restaurants - any place with lots of lights, noise, signs etc  Group conversations may be more difficult to process than one on one conversations  Give yourself extra time to process conversation, reading materials, directions or information from your healthcare providers  Organization is key - clutters of laundry, mail, paperwork, dirty dishes - all make it more difficult to concentrate  Before you start a task, have all the needed supplies, directions, recipes ready and organized. This way you don't have to go looking for something in the middle of a task and become distracted.   Be aware of fatigue - take rests or breaks when needed to re-group and re-focus  When you have visitors or phone calls - limit the number of people and the  time if you want The doctor says I can only visit for 20 minutes or The doctor says he should only have a few visitors at a time  Consider having a signal if you need to get out of a social situation (tug your ear or something) - you and your wife can talk about this

## 2023-12-16 ENCOUNTER — Encounter: Payer: Self-pay | Admitting: Occupational Therapy

## 2023-12-16 ENCOUNTER — Ambulatory Visit: Admitting: Occupational Therapy

## 2023-12-16 DIAGNOSIS — M6281 Muscle weakness (generalized): Secondary | ICD-10-CM | POA: Diagnosis not present

## 2023-12-16 DIAGNOSIS — R41842 Visuospatial deficit: Secondary | ICD-10-CM | POA: Diagnosis not present

## 2023-12-16 DIAGNOSIS — R2689 Other abnormalities of gait and mobility: Secondary | ICD-10-CM | POA: Diagnosis not present

## 2023-12-16 DIAGNOSIS — H53462 Homonymous bilateral field defects, left side: Secondary | ICD-10-CM | POA: Diagnosis not present

## 2023-12-16 DIAGNOSIS — L821 Other seborrheic keratosis: Secondary | ICD-10-CM | POA: Diagnosis not present

## 2023-12-16 DIAGNOSIS — R41840 Attention and concentration deficit: Secondary | ICD-10-CM | POA: Diagnosis not present

## 2023-12-16 DIAGNOSIS — L218 Other seborrheic dermatitis: Secondary | ICD-10-CM | POA: Diagnosis not present

## 2023-12-16 DIAGNOSIS — R278 Other lack of coordination: Secondary | ICD-10-CM | POA: Diagnosis not present

## 2023-12-16 DIAGNOSIS — R41844 Frontal lobe and executive function deficit: Secondary | ICD-10-CM

## 2023-12-16 DIAGNOSIS — D225 Melanocytic nevi of trunk: Secondary | ICD-10-CM | POA: Diagnosis not present

## 2023-12-16 DIAGNOSIS — D485 Neoplasm of uncertain behavior of skin: Secondary | ICD-10-CM | POA: Diagnosis not present

## 2023-12-16 DIAGNOSIS — I639 Cerebral infarction, unspecified: Secondary | ICD-10-CM | POA: Diagnosis not present

## 2023-12-16 DIAGNOSIS — R2681 Unsteadiness on feet: Secondary | ICD-10-CM | POA: Diagnosis not present

## 2023-12-16 NOTE — Therapy (Signed)
 OUTPATIENT OCCUPATIONAL THERAPY NEURO TREATMENT  Patient Name: Terry Hays MRN: [id number] DOB:07-15-1950, 73 y.o., male Today's Date: 12/16/2023  PCP: [name], MD REFERRING PROVIDER: Bulah Alm RAMAN, PA-C  END OF SESSION:  OT End of Session - 12/16/23 1105     Visit Number 8    Number of Visits 17    Date for Recertification  01/05/24    Authorization Type [employer] PPO--17 visits approved 11/10/23-01/05/24    Authorization - Number of Visits 17    Progress Note Due on Visit 10    OT Start Time 1103    OT Stop Time 1145    OT Time Calculation (min) 42 min    Activity Tolerance Patient tolerated treatment well    Behavior During Therapy [facility] for tasks assessed/performed             Past Medical History:  Diagnosis Date   Abnormal ECG    Allergy    Anxiety    FLYING   Diverticulosis    Dyslipidemia    History of prostate cancer    Hyperlipidemia    Hypertension    LVH (left ventricular hypertrophy)    Pre-diabetes    Past Surgical History:  Procedure Laterality Date   COLONOSCOPY  2004 2011   MAGOD   EYE SURGERY  03/10/1999   CATOACTS BOTH   HERNIA REPAIR  03/09/1952   R INGHINAL   INGUINAL HERNIA REPAIR Left 04/07/2018   Procedure: OPERN LEFT INGUINAL HERNIA REPAIR WITH MESH ERAS PATHWAY;  Surgeon: Tanda Locus, MD;  Location: [facility];  Service: General;  Laterality: Left;   LOOP RECORDER INSERTION N/A 09/17/2023   Procedure: LOOP RECORDER INSERTION;  Surgeon: Lesia Ozell Barter, PA-C;  Location: [facility] INVASIVE CV LAB;  Service: Cardiovascular;  Laterality: N/A;   PENILE PROSTHESIS IMPLANT  03/09/2009   DAHLSTEDT   PROSTATE SURGERY  03/09/2001   PROSTRATECTOMY   TRANSESOPHAGEAL ECHOCARDIOGRAM (CATH LAB) N/A 12/13/2023   Procedure: TRANSESOPHAGEAL ECHOCARDIOGRAM;  Surgeon: Floretta Mallard, MD;  Location: [facility] INVASIVE CV LAB;  Service: Cardiovascular;  Laterality: N/A;   Patient Active Problem List   Diagnosis Date Noted    Coronary artery disease involving native coronary artery of native heart without angina pectoris 12/03/2023   History of CVA (cerebrovascular accident) 11/06/2023   Cognitive impairment 09/12/2023   Right lower lobe pulmonary nodule 09/08/2023   Attention deficit hyperactivity disorder (ADHD), predominantly hyperactive type 06/03/2021   Cherry angioma 06/03/2021   Seborrheic keratosis 06/03/2021   Arthritis 03/17/2018   Spinal stenosis of lumbosacral region 03/17/2018   Actinic keratosis 03/17/2018   Degeneration of lumbar intervertebral disc 10/27/2017   History of prostate cancer 09/15/2011   Fear of flying 09/15/2011   Family history of heart disease in male family member before age 31 09/15/2011   Allergic rhinitis 09/15/2011   Hemorrhoids 09/15/2011   Hypertension 06/27/2010   Dyslipidemia    LVH (left ventricular hypertrophy)     ONSET DATE: late June 2025  REFERRING DIAG: Z86.73, Z92.89, H54.7  THERAPY DIAG:  Visuospatial deficit  Attention and concentration deficit  Frontal lobe and executive function deficit  Rationale for Evaluation and Treatment: Rehabilitation  SUBJECTIVE:   SUBJECTIVE STATEMENT: Pt reports he saw doctor yesterday (Dr. [name]) and he told me I couldn't drive Denies pain and no falls  Pt accompanied by: self and friend  PERTINENT HISTORY: CVA 09/08/23, hx of falls  PRECAUTIONS: None  WEIGHT BEARING RESTRICTIONS: No  PAIN:  Are you having pain?  No  FALLS: Has patient fallen in last 6 months? Yes. Number of falls unclear, but fell a couple weeks ago  LIVING ENVIRONMENT: Lives with: lives with their spouse Lives in: House/apartment Stairs: Yes: Internal: unclear but has a rail for internal steps steps; steps to enter home with rail and rail doing downstairs Has following equipment at home: Single point cane  PLOF: Independent, Independent with basic ADLs, Independent with household mobility without device, and Independent with  transfers  PATIENT GOALS: Pt would like to be able use his computer again for email, typing in word, etc. Would also love to be able to golf, play pickle ball etc. Ultimate goal is to drive.  OBJECTIVE:  Note: Objective measures were completed at Evaluation unless otherwise noted.  HAND DOMINANCE: Left  ADLs: Overall ADL: Mod I at this time, using a SPC for mobility.   IADLs: Overall pt is not driving and has assist for grocery shopping, bills, cooking, IADLs at this time.  Handwriting: 90% legible  MOBILITY STATUS: Hx of falls and needs SPC  POSTURE COMMENTS:  rounded shoulders Sitting balance: decresaed core strength noted but overall demonstrates functional sitting balance.   ACTIVITY TOLERANCE: Activity tolerance: decreased but functional for ADL tasks, not able to do IADL tasks such as golf and pickleball  FUNCTIONAL OUTCOME MEASURES: NA - pt truly does not feel like he has LUE deficits from CVA and wants to work on vision  UPPER EXTREMITY ROM:    Active ROM Right eval Left eval  Shoulder flexion    Shoulder abduction    Shoulder adduction    Shoulder extension    Shoulder internal rotation    Shoulder external rotation    Elbow flexion    Elbow extension    Wrist flexion    Wrist extension    Wrist ulnar deviation    Wrist radial deviation    Wrist pronation    Wrist supination    (Blank rows = not tested)  UPPER EXTREMITY MMT:     MMT Right eval Left eval  Shoulder flexion 150 WFL  Shoulder abduction    Shoulder adduction    Shoulder extension    Shoulder internal rotation    Shoulder external rotation    Middle trapezius    Lower trapezius    Elbow flexion    Elbow extension    Wrist flexion    Wrist extension    Wrist ulnar deviation    Wrist radial deviation    Wrist pronation    Wrist supination    (Blank rows = not tested)  Note mild deficits in B shoulders for shoulder flexion 2/2 old injuries from falls   HAND FUNCTION: WFL -  note pt verbose and arrived late to evaluation, did not have time to asses grip strength as pt wanted to focus on vision. Will plan to assess in future sessions.   COORDINATION: Finger Nose Finger test: dysmetria and decreased coordination noted but unclear if this is from visual deficits to aim for other hand  11/25/23: 9 hole peg test: RT = 49.58 sec (slower d/t missing far Lt side and difficulty following directions even after multiple cues) Lt = 44.70 (difficulty following directions)   SENSATION: WFL  EDEMA: none  MUSCLE TONE: RUE: Within functional limits and LUE: Within functional limits  COGNITION: Overall cognitive status: Impaired and Difficulty to assess due to: Note pt does not think deficits are significant but statements from S.O. in room states that cognition has been limited  for comprehension, short term memory, and higher level tasks.   VISION: Subjective report: Pt reports he cannot see anything on the left - like its not even there. States he has learned various scanning and compensatory techniques.  Baseline vision: no glassess but demonstrates hemianopsia  Visual history: none prior to CVA   VISION ASSESSMENT: Impaired To be further assessed in functional context Eye alignment: Impaired:   Tracking/Visual pursuits: Decreased smoothness with horizontal tracking, Decreased smoothness with vertical tracking, Decreased smoothness of eye movement to Left superior field, Decreased smoothness of eye movement to Left inferior field, and Requires cues, head turns, or add eye shifts to track Visual Fields: Left homonymous hemianopsia  Patient has difficulty with following activities due to following visual impairments: driving, cooking, pickleball, golfing, has ad recent falls.   PERCEPTION: Impaired: Inattention/neglect: does not attend to left visual field  PRAXIS: Not tested  OBSERVATIONS: The pt is a 73 yo male who is accompanied by his S.O. this date walking with a  SPC who demonstrates dignificant visual deficits post CVA. Pt demonstrates slight deficits in BUE shoulder ROM but is fairly symmetrical and good MMT - does not feel like strength has been impacted from CVA from a neuro stand point. Pt with significant visual field deficits, L hemianopsia, and decreased attention to L visual field impacting skill performance.                                                                                                                             TREATMENT DATE:  12/16/23  Tabletop scanning to match digital times to analogue times - pt required cues and extra time to scan for times on Lt, and cues to scan all the way to edge of table. Pt required extra time and min to moderate cues t/o task, however improved w/ less cueing needed w/ repetition.  Pt also cued to look at Lt hand and see what it's doing when reaching for the digital times, as pt was blindly reaching to Lt side and saying there were no more times left when there was. Reports eye/brain fatigue after this task.   Pt then worked on Museum/gallery exhibitions officer from phone (unable to practice this on computer b/c he cannot remember his password) and successfully sent emai.   Practiced looking up things on computer and still required cues to find google icon located on Lt side of computer.      PATIENT EDUCATION: Education details:  see above for cueing/reinforced use of visual compensation strategies (organized scanning pattern, use of edges, use of contrast, head turns, double checking) Person educated: Patient and friend Education method: Explanation, Demonstration, Verbal cues, and Handouts Education comprehension: verbalized understanding, returned demonstration, verbal cues required, and needs further education  HOME EXERCISE PROGRAM: 11/22/23:  Visual HEP and compensation strategies  11/25/23: coordination HEP    GOALS: Goals reviewed with patient? Yes  SHORT TERM GOALS: Target date: 12/08/23  Pt will be  Supervision with  vision specific HEP to further integrate strategies and scanning into daily life for max recovery and benefit.  Baseline: Pt does incooperate techniques from hospital but would benefit from further training  Goal status: PARTIALLY MET  11/26/23, educated, but needs min cueing/review at times  2.  Pt will demonstrate improved visual scanning to locate 5 items in a room with no more than 2 verbal cues to locate items.  Baseline:  5 items, needing 3+ cues for scanning Goal status: Met in min distracting environment.  12/06/23  3.  Pt will demonstrate improved visual acuity and/or use of adaptations to use his personal computer for leisure activities such as email, typing in Word, etc with no more than Supervision.  Baseline: Pt reports significant difficulty with this Goal status: PARTIALLY MET w/ occasional cues (12/09/23 - Needed mod cueing to send email, use word, etc)   4.  Pt will perform a visual scanning task/worksheet of choice with no more than 2 errors in order to demonstrate improved scanning and acuity.  Goal status: Ongoing 12/06/23 1.3M scanning sheet with 7 errors (93%)     LONG TERM GOALS: Target date: 01/05/24  Pt will be MOD I with vision specific HEP to further integrate strategies and scanning into daily life for max recovery and benefit.  Baseline: Pt does incooperate techniques from hospital but would benefit from further training  Goal status: INITIAL  2.  Pt will demonstrate improved visual scanning to locate 5 items in a room with no verbal cues to locate items.  Baseline:  5 items, needing 3+ cues for scanning Goal status: INITIAL  3.  Pt will demonstrate improved visual acuity and/or use of adaptations to use his personal computer for leisure activities such as email, typing in Word, etc with Independence. Baseline: Pt reports significant difficulty with this Goal status: INITIAL  4.  If safe and appropriate, pt will report a return to some leisure  activities such as modified golf, pickle ball, walking, etc with use of strategies, AE, or compensatory techniques to improve safety and participation.  Baseline: not safe and not performing at this time Goal status: INITIAL    ASSESSMENT:  CLINICAL IMPRESSION:  Pt w/ decreased ability to retain visual scanning strategies and decreased ability to transfer strategies to other tasks. Pt w/ also decreased visual processing of information to Lt side.  ? [place] as well as Lt field cut  PERFORMANCE DEFICITS: in functional skills including ADLs, IADLs, proprioception, endurance, and vision, cognitive skills including attention, memory, problem solving, safety awareness, and sequencing, and psychosocial skills including coping strategies, environmental adaptation, habits, and routines and behaviors.   IMPAIRMENTS: are limiting patient from ADLs, IADLs, leisure, and social participation.   CO-MORBIDITIES: has no other co-morbidities that affects occupational performance. Patient will benefit from skilled OT to address above impairments and improve overall function.  MODIFICATION OR ASSISTANCE TO COMPLETE EVALUATION: Min-Moderate modification of tasks or assist with assess necessary to complete an evaluation.  OT OCCUPATIONAL PROFILE AND HISTORY: Detailed assessment: Review of records and additional review of physical, cognitive, psychosocial history related to current functional performance.  CLINICAL DECISION MAKING: Moderate - several treatment options, min-mod task modification necessary  REHAB POTENTIAL: Good  EVALUATION COMPLEXITY: Moderate   PLAN:  OT FREQUENCY: 2x/week  OT DURATION: 8 weeks  PLANNED INTERVENTIONS: 97168 OT Re-evaluation, 97535 self care/ADL training, 02889 therapeutic exercise, 97530 therapeutic activity, and 97112 neuromuscular re-education  RECOMMENDED OTHER SERVICES: NA  CONSULTED AND AGREED WITH PLAN OF CARE: Patient and  family member/caregiver  PLAN FOR  NEXT SESSION:  check remaining STG,  continue to review and practice visual compensation strategies in functional tasks, environmental scanning, tabletop scanning activities    Burnard JINNY Roads, OTR/L 12/16/2023, 11:05 AM

## 2023-12-17 ENCOUNTER — Telehealth: Payer: Self-pay | Admitting: Student in an Organized Health Care Education/Training Program

## 2023-12-17 NOTE — Telephone Encounter (Signed)
 Pt's wife calling to f/u on TEE results. Please advise

## 2023-12-17 NOTE — Telephone Encounter (Signed)
 Left detailed message that I will forward to MD for final report findings.  Explained that office will contact them once that has been reviewed.

## 2023-12-20 ENCOUNTER — Ambulatory Visit: Admitting: Physical Therapy

## 2023-12-20 ENCOUNTER — Encounter: Payer: Self-pay | Admitting: Physical Therapy

## 2023-12-20 ENCOUNTER — Encounter: Admitting: Occupational Therapy

## 2023-12-20 ENCOUNTER — Encounter

## 2023-12-20 ENCOUNTER — Ambulatory Visit: Admitting: Occupational Therapy

## 2023-12-20 ENCOUNTER — Ambulatory Visit

## 2023-12-20 DIAGNOSIS — H53462 Homonymous bilateral field defects, left side: Secondary | ICD-10-CM

## 2023-12-20 DIAGNOSIS — R41840 Attention and concentration deficit: Secondary | ICD-10-CM | POA: Diagnosis not present

## 2023-12-20 DIAGNOSIS — I639 Cerebral infarction, unspecified: Secondary | ICD-10-CM | POA: Diagnosis not present

## 2023-12-20 DIAGNOSIS — R41842 Visuospatial deficit: Secondary | ICD-10-CM

## 2023-12-20 DIAGNOSIS — R2689 Other abnormalities of gait and mobility: Secondary | ICD-10-CM

## 2023-12-20 DIAGNOSIS — M6281 Muscle weakness (generalized): Secondary | ICD-10-CM | POA: Diagnosis not present

## 2023-12-20 DIAGNOSIS — R2681 Unsteadiness on feet: Secondary | ICD-10-CM | POA: Diagnosis not present

## 2023-12-20 DIAGNOSIS — R278 Other lack of coordination: Secondary | ICD-10-CM

## 2023-12-20 DIAGNOSIS — Z8673 Personal history of transient ischemic attack (TIA), and cerebral infarction without residual deficits: Secondary | ICD-10-CM | POA: Diagnosis not present

## 2023-12-20 DIAGNOSIS — R41844 Frontal lobe and executive function deficit: Secondary | ICD-10-CM | POA: Diagnosis not present

## 2023-12-20 LAB — CUP PACEART REMOTE DEVICE CHECK
Date Time Interrogation Session: 20251013070403
Implantable Pulse Generator Implant Date: 20250711
Pulse Gen Model: 5000
Pulse Gen Serial Number: 511069369

## 2023-12-20 NOTE — Patient Instructions (Signed)
 SABRA

## 2023-12-20 NOTE — Therapy (Signed)
 OUTPATIENT PHYSICAL THERAPY NEURO TREATMENT NOTE/RE-CERT   Patient Name: Terry Hays MRN: [id number] DOB:04/07/1950, 73 y.o., male Today's Date: 12/20/2023   PCP: [name], MD  REFERRING PROVIDER: Bulah Alm RAMAN, PA-C   END OF SESSION:  PT End of Session - 12/20/23 1024     Visit Number 5    Number of Visits 13    Date for Recertification  12/17/23    Authorization Type [employer] Medicare-submitted auth at eval    Authorization Time Period 8-29 - 12-17-23    Authorization - Visit Number 5    Authorization - Number of Visits 13    Progress Note Due on Visit 10    PT Start Time 1019    PT Stop Time 1100    PT Time Calculation (min) 41 min    Equipment Utilized During Treatment Gait belt    Activity Tolerance Patient tolerated treatment well    Behavior During Therapy WFL for tasks assessed/performed           Past Medical History:  Diagnosis Date   Abnormal ECG    Allergy    Anxiety    FLYING   Diverticulosis    Dyslipidemia    History of prostate cancer    Hyperlipidemia    Hypertension    LVH (left ventricular hypertrophy)    Pre-diabetes    Past Surgical History:  Procedure Laterality Date   COLONOSCOPY  2004 2011   MAGOD   EYE SURGERY  03/10/1999   CATOACTS BOTH   HERNIA REPAIR  03/09/1952   R INGHINAL   INGUINAL HERNIA REPAIR Left 04/07/2018   Procedure: OPERN LEFT INGUINAL HERNIA REPAIR WITH MESH ERAS PATHWAY;  Surgeon: Tanda Locus, MD;  Location: [facility];  Service: General;  Laterality: Left;   LOOP RECORDER INSERTION N/A 09/17/2023   Procedure: LOOP RECORDER INSERTION;  Surgeon: Lesia Ozell Barter, PA-C;  Location: [facility] INVASIVE CV LAB;  Service: Cardiovascular;  Laterality: N/A;   PENILE PROSTHESIS IMPLANT  03/09/2009   DAHLSTEDT   PROSTATE SURGERY  03/09/2001   PROSTRATECTOMY   TRANSESOPHAGEAL ECHOCARDIOGRAM (CATH LAB) N/A 12/13/2023   Procedure: TRANSESOPHAGEAL ECHOCARDIOGRAM;  Surgeon: Floretta Mallard, MD;  Location: [facility]  INVASIVE CV LAB;  Service: Cardiovascular;  Laterality: N/A;   Patient Active Problem List   Diagnosis Date Noted   Coronary artery disease involving native coronary artery of native heart without angina pectoris 12/03/2023   History of CVA (cerebrovascular accident) 11/06/2023   Cognitive impairment 09/12/2023   Right lower lobe pulmonary nodule 09/08/2023   Attention deficit hyperactivity disorder (ADHD), predominantly hyperactive type 06/03/2021   Cherry angioma 06/03/2021   Seborrheic keratosis 06/03/2021   Arthritis 03/17/2018   Spinal stenosis of lumbosacral region 03/17/2018   Actinic keratosis 03/17/2018   Degeneration of lumbar intervertebral disc 10/27/2017   History of prostate cancer 09/15/2011   Fear of flying 09/15/2011   Family history of heart disease in male family member before age 39 09/15/2011   Allergic rhinitis 09/15/2011   Hemorrhoids 09/15/2011   Hypertension 06/27/2010   Dyslipidemia    LVH (left ventricular hypertrophy)     ONSET DATE: 10/20/2023 (MD referral)  REFERRING DIAG:  Z86.73 (ICD-10-CM) - History of stroke  H54.7 (ICD-10-CM) - Vision loss  Z92.89 (ICD-10-CM) - History of recent hospitalization    THERAPY DIAG:  Unsteadiness on feet  Other abnormalities of gait and mobility  Rationale for Evaluation and Treatment: Rehabilitation  SUBJECTIVE:  SUBJECTIVE STATEMENT:  Pt reports he is getting better but still stumbles over things occasionally when he forgets to pay attention; reports improved ability with recovery of balance when he does stumble  Pt accompanied by: self  PERTINENT HISTORY: PMH prior CVAs (unknown to patient), ADHD, HTN, HLD, prostate cancer, diverticulosis who presented to the ER with confusion and left visual field changes  (09/2023)  PAIN:  Are you having pain? No  PRECAUTIONS: Fall and Other: decreased peripheral vision from CVA   Avoid driving  RED FLAGS: None   WEIGHT BEARING RESTRICTIONS: No  FALLS: Has patient fallen in last 6 months? Yes. Number of falls 1-R knee gave way  LIVING ENVIRONMENT: Lives with: lives with their spouse Lives in: House/apartment Stairs: steps to enter with rails; upstairs and downstairs with rail Has following equipment at home: Single point cane  PLOF: Independent and Leisure: played pickleball and golf; was working out for core strengthening and walking several miles everyday  PATIENT GOALS: To get back to playing pickleball [employer]) and golf  OBJECTIVE:  Note: Objective measures were completed at Evaluation unless otherwise noted.  DIAGNOSTIC FINDINGS: acute CVA of the right posterior cerebral artery,   COGNITION: Overall cognitive status: Within functional limits for tasks assessed   SENSATION: Light touch: WFL  COORDINATION: WFL for bilat alt taps   MUSCLE TONE: WFL  POSTURE: rounded shoulders  LOWER EXTREMITY ROM:   WFL BLEs   LOWER EXTREMITY MMT:    MMT Right Eval Left Eval  Hip flexion 4 4+  Hip extension    Hip abduction 5 5  Hip adduction 5 5  Hip internal rotation    Hip external rotation    Knee flexion 5 5  Knee extension 4 4  Ankle dorsiflexion 4 4  Ankle plantarflexion    Ankle inversion    Ankle eversion    (Blank rows = not tested)  TRANSFERS: Sit to stand: Modified independence  Assistive device utilized: None     Stand to sit: Modified independence  Assistive device utilized: None      GAIT: Findings: Gait Characteristics: veers to R, step through pattern, and wide BOS, Distance walked: 50 ft, Assistive device utilized:Single point cane, and Level of assistance: SBA  FUNCTIONAL TESTS:  5 times sit to stand: 13.91 sec Timed up and go (TUG): NT 10 meter walk test: 13.41 veers to R= 2.44 ft/sec FGA=  16/30  M-CTSIB  Condition 1: Firm Surface, EO 30 Sec, Normal Sway  Condition 2: Firm Surface, EC 30 Sec, Normal Sway  Condition 3: Foam Surface, EO 30 Sec, Mild Sway  Condition 4: Foam Surface, EC 30 Sec, Mild and Moderate Sway    12-20-23  [facility] PT Assessment - 12/20/23 0001       Functional Gait  Assessment   Gait Level Surface Walks 20 ft in less than 7 sec but greater than 5.5 sec, uses assistive device, slower speed, mild gait deviations, or deviates 6-10 in outside of the 12 in walkway width.   5.75   Change in Gait Speed Able to smoothly change walking speed without loss of balance or gait deviation. Deviate no more than 6 in outside of the 12 in walkway width.    Gait with Horizontal Head Turns Performs head turns smoothly with no change in gait. Deviates no more than 6 in outside 12 in walkway width    Gait with Vertical Head Turns Performs task with slight change in gait velocity (eg, minor disruption to smooth  gait path), deviates 6 - 10 in outside 12 in walkway width or uses assistive device    Gait and Pivot Turn Pivot turns safely within 3 sec and stops quickly with no loss of balance.    Step Over Obstacle Is able to step over 2 stacked shoe boxes taped together (9 in total height) without changing gait speed. No evidence of imbalance.    Gait with Narrow Base of Support Ambulates 4-7 steps.    Gait with Eyes Closed Walks 20 ft, uses assistive device, slower speed, mild gait deviations, deviates 6-10 in outside 12 in walkway width. Ambulates 20 ft in less than 9 sec but greater than 7 sec.    Ambulating Backwards Walks 20 ft, uses assistive device, slower speed, mild gait deviations, deviates 6-10 in outside 12 in walkway width.    Steps Alternating feet, must use rail.    Total Score 23                                                                                                                                       TREATMENT DATE: 12-20-23  Gait:   FGA - see  above:  score 23/30  Gait velocity: 13.63, 11.75 = 2.79 ft/sec without use of SPC  TherAct:  Pt stood on rockerboard inside // bars with 6 squigz placed on table on pt's Lt side; specific color was called for pt to scan on his Lt side and reach for that specific named Squigz and hand to PT on pt's Rt side - in various locations to incorporate head turns to Lt and Rt sides; CGA to SBA - pt did not have to use UE support on // bar for assist with balance Progressed activity to pt standing perpendicular on blue foam balance beam - performed sidestepping both directions to retrieve specific Squigz of named color - performed sidestepping to both Lt and Rt directions on balance beam - CGA to SBA for balance   Placed Squizg on outside of // bars with 2 on floor - pt retrieved Squigz of named color with incorporation of walking forward/backward and head turns to Lt side for improved visual scanning of objects on pt's Lt side;  pt able to pick up the 2 Squizg on the floor without LOB   Performed stepping strategy activity for balance (1 rep each direction) - alternate stepping forward/back, 1/4 turn with performing sidestepping RLE and LLE: 1/4 turn with performing stepping backward and then 1/4 turn with marching in place with horizontal head turns 5 reps   PATIENT EDUCATION: Education details: Continue HEP and corner balance additions   Person educated: Patient Education method: Programmer, multimedia, Demonstration, Verbal cues, and Handouts Education comprehension: verbalized understanding, returned demonstration, and needs further education  HOME EXERCISE PROGRAM: Access Code: [email or web address] URL: [email or web address] Date: 12/06/2023 Prepared by: [place] Senate  Exercises - Single Leg Stance with Support  - 1 x daily -  7 x weekly - 1 sets - 2-3 reps - 10 secs hold - Tandem Stance with Chair Support  - 1 x daily - 7 x weekly - 1 sets - 2 reps - 30 secs  hold - Standing Single Leg Heel Raise  -  1 x daily - 7 x weekly - 1 sets - 10 reps - 2 secs hold - Walking with Head Rotation  - 1 x daily - 7 x weekly - 1 sets - 10 reps - Romberg Stance Eyes Closed on Foam Pad  - 1 x daily - 5 x weekly - 3 sets - 30 hold - Romberg Stance on Foam Pad with Head Rotation  - 1 x daily - 5 x weekly - 2 sets - 10 reps  GOALS: Goals reviewed with patient? Yes  SHORT TERM GOALS: Target date: 11/19/2023  Pt will be independent with HEP for improved balance and gait. Baseline: Goal status: Not met - 1st PT appt 11-22-23  2.  Pt will improve 5x sit<>stand to less than or equal to 12 sec to demonstrate improved functional strength and transfer efficiency. Baseline: 13.41 sec; 11-22-23 - 10.22 secs without UE support from chair Goal status: Goal met 11-22-23   LONG TERM GOALS: Target date: 12/17/2023  Pt will be independent with HEP for improved balance, gait. Baseline:  Goal status: Goal met 12-20-23  2.  Pt will improve FGA score to at least 23/30 to decrease fall risk. Baseline: 16/30;  23/30 (12-20-23) Goal status: Goal met   3.  Pt will improve gait velocity to at least 2.62 ft/sec for improved gait efficiency and safety. Baseline: 2.44 ft/sec;  12-20-23:  13.63, 11.75 = 2.79 ft/sec  Goal status: Goal met 12-20-23  4.  Pt will verbalize understanding of fall prevention in home environment. Baseline:  Goal status: Goal met 12-20-23  5.  Pt will verbalize plans for continued community fitness upon d/c from PT, to maximize gains made in PT. Baseline:  Goal status: Ongoing 12-20-23   NEW LONG TERM GOALS:  Target Date:  01-21-24    Pt will improve FGA score to at least 25/30 to decrease fall risk.    Baseline: 16/30;  23/30 (12-20-23)    Goal status:  Updated   2.  Pt will improve gait velocity to at least 3.0 ft/sec for improved gait efficiency and safety. Baseline: 2.44 ft/sec;  12-20-23:  13.63, 11.75 = 2.79 ft/sec  Goal status: Updated   ASSESSMENT:  CLINICAL IMPRESSION: Pt has  met LTG's #1-4 with LTG #5 ongoing as pt is continuing PT for 3 additional sessions.  Pt's balance continues to be impacted by his visual impairments with Lt visual field deficits.  Pt reported minimal dizziness provoked with marching with horizontal head turns, but no LOB occurred.  Pt's FGA score has increased from 16/30 to 23/30.  Continue per POC.   OBJECTIVE IMPAIRMENTS: Abnormal gait, decreased balance, decreased mobility, difficulty walking, and decreased strength.   ACTIVITY LIMITATIONS: transfers and locomotion level  PARTICIPATION LIMITATIONS: driving, community activity, and fitness/leisure activities  PERSONAL FACTORS: 3+ comorbidities: PMH above are also affecting patient's functional outcome.   REHAB POTENTIAL: Good  CLINICAL DECISION MAKING: Stable/uncomplicated  EVALUATION COMPLEXITY: Low  PLAN:  PT FREQUENCY: 1x/week   PT DURATION: 4 weeks (per recert)  PLANNED INTERVENTIONS: 97750- Physical Performance Testing, 97110-Therapeutic exercises, 97530- Therapeutic activity, [id number]- Neuromuscular re-education, 97535- Self Care, [id number]- Gait training, Patient/Family education, and Balance training  PLAN FOR NEXT SESSION: continue balance with visual  scanning activity; pt requests to progress above rockerboard activity to retrieving object on Lt side and then turning around (180 degrees) to place object on his Rt side   dynamic gait and balance - activities to incorporate visual scanning, esp. On Lt side (Renewal completed 12-20-23)  [place], PT 12/20/2023, 12:58 PM   Referring diagnosis?  Z86.73 (ICD-10-CM) - History of stroke  H54.7 (ICD-10-CM) - Vision loss  Z92.89 (ICD-10-CM) - History of recent hospitalization   Treatment diagnosis? (if different than referring diagnosis) R 26.81, R 26.89, M62.81 What was this (referring dx) caused by? []  Surgery []  Fall []  Ongoing issue []  Arthritis [x]  Other: ____________  Laterality: []  Rt []  Lt [x]   Both  Check all possible CPT codes:  *CHOOSE 10 OR LESS*    See Planned Interventions listed in the Plan section of the Evaluation.

## 2023-12-20 NOTE — Therapy (Unsigned)
**Note Terry-Identified via Obfuscation**  OUTPATIENT OCCUPATIONAL THERAPY NEURO TREATMENT  Patient Name: DEMARRIO Hays MRN: [id number] DOB:19-Aug-1950, 73 y.o., male Today's Date: 12/20/2023  PCP: Terry Norleen BROCKS, MD REFERRING PROVIDER: Bulah Alm RAMAN, PA-C  END OF SESSION:  OT End of Session - 12/20/23 1104     Visit Number 9    Number of Visits 17    Date for Recertification  01/05/24    Authorization Type [employer] PPO--17 visits approved 11/10/23-01/05/24    Authorization - Number of Visits 17    Progress Note Due on Visit 10    OT Start Time 1104    OT Stop Time 1157    OT Time Calculation (min) 53 min    Equipment Utilized During Treatment Cards/puzzle    Activity Tolerance Patient tolerated treatment well    Behavior During Therapy [facility] for tasks assessed/performed             Past Medical History:  Diagnosis Date   Abnormal ECG    Allergy    Anxiety    FLYING   Diverticulosis    Dyslipidemia    History of prostate cancer    Hyperlipidemia    Hypertension    LVH (left ventricular hypertrophy)    Pre-diabetes    Past Surgical History:  Procedure Laterality Date   COLONOSCOPY  2004 2011   MAGOD   EYE SURGERY  03/10/1999   CATOACTS BOTH   HERNIA REPAIR  03/09/1952   R INGHINAL   INGUINAL HERNIA REPAIR Left 04/07/2018   Procedure: OPERN LEFT INGUINAL HERNIA REPAIR WITH MESH ERAS PATHWAY;  Surgeon: Tanda Locus, MD;  Location: [facility];  Service: General;  Laterality: Left;   LOOP RECORDER INSERTION N/A 09/17/2023   Procedure: LOOP RECORDER INSERTION;  Surgeon: Lesia Ozell Barter, PA-C;  Location: [facility] INVASIVE CV LAB;  Service: Cardiovascular;  Laterality: N/A;   PENILE PROSTHESIS IMPLANT  03/09/2009   DAHLSTEDT   PROSTATE SURGERY  03/09/2001   PROSTRATECTOMY   TRANSESOPHAGEAL ECHOCARDIOGRAM (CATH LAB) N/A 12/13/2023   Procedure: TRANSESOPHAGEAL ECHOCARDIOGRAM;  Surgeon: Floretta Mallard, MD;  Location: [facility] INVASIVE CV LAB;  Service: Cardiovascular;  Laterality: N/A;    Patient Active Problem List   Diagnosis Date Noted   Coronary artery disease involving native coronary artery of native heart without angina pectoris 12/03/2023   History of CVA (cerebrovascular accident) 11/06/2023   Cognitive impairment 09/12/2023   Right lower lobe pulmonary nodule 09/08/2023   Attention deficit hyperactivity disorder (ADHD), predominantly hyperactive type 06/03/2021   Cherry angioma 06/03/2021   Seborrheic keratosis 06/03/2021   Arthritis 03/17/2018   Spinal stenosis of lumbosacral region 03/17/2018   Actinic keratosis 03/17/2018   Degeneration of lumbar intervertebral disc 10/27/2017   History of prostate cancer 09/15/2011   Fear of flying 09/15/2011   Family history of heart disease in male family member before age 66 09/15/2011   Allergic rhinitis 09/15/2011   Hemorrhoids 09/15/2011   Hypertension 06/27/2010   Dyslipidemia    LVH (left ventricular hypertrophy)     ONSET DATE: late June 2025  REFERRING DIAG: Z86.73, Z92.89, H54.7  THERAPY DIAG:  Visuospatial deficit  Hemianopia, homonymous, left  Other lack of coordination  Attention and concentration deficit  Rationale for Evaluation and Treatment: Rehabilitation  SUBJECTIVE:   SUBJECTIVE STATEMENT: Pt reports he saw doctor yesterday (Terry Hays) and he told me I couldn't drive Denies pain and no falls  Pt accompanied by: self and spouse  PERTINENT HISTORY: CVA 09/08/23, hx of falls  PRECAUTIONS: None  WEIGHT  BEARING RESTRICTIONS: No  PAIN:  Are you having pain? No  FALLS: Has patient fallen in last 6 months? Yes. Number of falls unclear, but fell a couple weeks ago Not paying attention and has had stumbles but not falls.  LIVING ENVIRONMENT: Lives with: lives with their spouse Lives in: House/apartment Stairs: Yes: Internal: unclear but has a rail for internal steps steps; steps to enter home with rail and rail doing downstairs Has following equipment at home: Single point  cane  PLOF: Independent, Independent with basic ADLs, Independent with household mobility without device, and Independent with transfers  PATIENT GOALS: Pt would like to be able use his computer again for email, typing in word, etc. Would also love to be able to golf, play pickle ball etc. Ultimate goal is to drive.  OBJECTIVE:  Note: Objective measures were completed at Evaluation unless otherwise noted.  HAND DOMINANCE: Left  ADLs: Overall ADL: Mod I at this time, using a SPC for mobility.   IADLs: Overall pt is not driving and has assist for grocery shopping, bills, cooking, IADLs at this time.  Handwriting: 90% legible  MOBILITY STATUS: Hx of falls and needs SPC  POSTURE COMMENTS:  rounded shoulders Sitting balance: decreased core strength noted but overall demonstrates functional sitting balance.   ACTIVITY TOLERANCE: Activity tolerance: decreased but functional for ADL tasks, not able to do IADL tasks such as golf and pickleball  FUNCTIONAL OUTCOME MEASURES: NA - pt truly does not feel like he has LUE deficits from CVA and wants to work on vision  UPPER EXTREMITY ROM:    Active ROM Right eval Left eval  Shoulder flexion    Shoulder abduction    Shoulder adduction    Shoulder extension    Shoulder internal rotation    Shoulder external rotation    Elbow flexion    Elbow extension    Wrist flexion    Wrist extension    Wrist ulnar deviation    Wrist radial deviation    Wrist pronation    Wrist supination    (Blank rows = not tested)  UPPER EXTREMITY MMT:     MMT Right eval Left eval  Shoulder flexion 150 WFL  Shoulder abduction    Shoulder adduction    Shoulder extension    Shoulder internal rotation    Shoulder external rotation    Middle trapezius    Lower trapezius    Elbow flexion    Elbow extension    Wrist flexion    Wrist extension    Wrist ulnar deviation    Wrist radial deviation    Wrist pronation    Wrist supination    (Blank rows  = not tested)  Note mild deficits in B shoulders for shoulder flexion 2/2 old injuries from falls   HAND FUNCTION: WFL - note pt verbose and arrived late to evaluation, did not have time to asses grip strength as pt wanted to focus on vision. Will plan to assess in future sessions.   COORDINATION: Finger Nose Finger test: dysmetria and decreased coordination noted but unclear if this is from visual deficits to aim for other hand  11/25/23: 9 hole peg test: RT = 49.58 sec (slower d/t missing far Lt side and difficulty following directions even after multiple cues) Lt = 44.70 (difficulty following directions)   SENSATION: WFL  EDEMA: none  MUSCLE TONE: RUE: Within functional limits and LUE: Within functional limits  COGNITION: Overall cognitive status: Impaired and Difficulty to assess due to:  Note pt does not think deficits are significant but statements from S.O. in room states that cognition has been limited for comprehension, short term memory, and higher level tasks.   VISION: Subjective report: Pt reports he cannot see anything on the left - like its not even there. States he has learned various scanning and compensatory techniques.  Baseline vision: no glassess but demonstrates hemianopsia  Visual history: none prior to CVA   VISION ASSESSMENT: Impaired To be further assessed in functional context Eye alignment: Impaired:   Tracking/Visual pursuits: Decreased smoothness with horizontal tracking, Decreased smoothness with vertical tracking, Decreased smoothness of eye movement to Left superior field, Decreased smoothness of eye movement to Left inferior field, and Requires cues, head turns, or add eye shifts to track Visual Fields: Left homonymous hemianopsia  Patient has difficulty with following activities due to following visual impairments: driving, cooking, pickleball, golfing, has ad recent falls.   PERCEPTION: Impaired: Inattention/neglect: does not attend to left  visual field  PRAXIS: Not tested  OBSERVATIONS: The pt is a 73 yo male who is accompanied by his S.O. this date walking with a SPC who demonstrates dignificant visual deficits post CVA. Pt demonstrates slight deficits in BUE shoulder ROM but is fairly symmetrical and good MMT - does not feel like strength has been impacted from CVA from a neuro stand point. Pt with significant visual field deficits, L hemianopsia, and decreased attention to L visual field impacting skill performance.                                                                                                                             TREATMENT DATE:  12/20/23  - Therapeutic activities/self care education completed for duration as noted below including:  Pt participated in tabletop visual-motor and cognitive activities Scientist, forensic and puzzle sorting) to address visual scanning, attention to left and inferior visual fields, and L UE coordination to improve performance in IADL and leisure participation.  Education provided throughout re: visual compensatory strategies, positioning and carryover at home ie) playing with grandchildren a nd walking in the grocery store with spouse to find a few food items on his own.  Pt educated in Manufacturing systems engineer (see pt instructions) using a standard deck of cards arranged in 7 columns.  OT placed a red folder on pt's L side as a high-contrast visual anchor to encourage consistent head and eye turning toward the L visual field, particularly to locate the furthest card piles.  Pt demonstrated difficulty with inferior visual field awareness, requiring verbal and visual cues to locate lower L-side cards.  Activity emphasized use of L UE for grasping, flipping, and placing cards to improve coordination of LUE.  Transitioned to flipping and sorting puzzle pieces to further address fine motor coordination and visual search to L and lower quadrants.  Initial pieces were upside down and similar color to  wood table.  Education provided on options to improve success ie) back up from  table, slight turn in position ie) turn knees slightly to his L to improve ease of visual scanning and ongoing use of a visual anchor to his L side.  Task complexity graded by decreasing cueing and increasing task demands as tolerated.  PATIENT EDUCATION: Education details:  Manufacturing systems engineer for visual scanning Person educated: Patient and spouse Education method: Explanation, Facilities manager, Verbal cues, and Handouts Education comprehension: verbalized understanding, returned demonstration, verbal cues required, and needs further education  HOME EXERCISE PROGRAM: 11/22/23:  Visual HEP and compensation strategies  11/25/23: coordination HEP  12/20/23: Golf Solitaire  GOALS: Goals reviewed with patient? Yes  SHORT TERM GOALS: Target date: 12/08/23  Pt will be Supervision with vision specific HEP to further integrate strategies and scanning into daily life for max recovery and benefit.  Baseline: Pt does incooperate techniques from hospital but would benefit from further training  Goal status: PARTIALLY MET  11/26/23, educated, but needs min cueing/review at times  2.  Pt will demonstrate improved visual scanning to locate 5 items in a room with no more than 2 verbal cues to locate items.  Baseline:  5 items, needing 3+ cues for scanning Goal status: Met in min distracting environment.  12/06/23  3.  Pt will demonstrate improved visual acuity and/or use of adaptations to use his personal computer for leisure activities such as email, typing in Word, etc with no more than Supervision.  Baseline: Pt reports significant difficulty with this Goal status: PARTIALLY MET w/ occasional cues (12/09/23 - Needed mod cueing to send email, use word, etc)   4.  Pt will perform a visual scanning task/worksheet of choice with no more than 2 errors in order to demonstrate improved scanning and acuity.  Goal status: Ongoing 12/06/23 1.78M  scanning sheet with 7 errors (93%)     LONG TERM GOALS: Target date: 01/05/24  Pt will be MOD I with vision specific HEP to further integrate strategies and scanning into daily life for max recovery and benefit.  Baseline: Pt does incooperate techniques from hospital but would benefit from further training  Goal status: IN Progress  2.  Pt will demonstrate improved visual scanning to locate 5 items in a room with no verbal cues to locate items.  Baseline:  5 items, needing 3+ cues for scanning Goal status: INITIAL  3.  Pt will demonstrate improved visual acuity and/or use of adaptations to use his personal computer for leisure activities such as email, typing in Word, etc with Independence. Baseline: Pt reports significant difficulty with this Goal status: INITIAL  4.  If safe and appropriate, pt will report a return to some leisure activities such as modified golf, pickle ball, walking, etc with use of strategies, AE, or compensatory techniques to improve safety and participation.  Baseline: not safe and not performing at this time Goal status: INITIAL    ASSESSMENT:  CLINICAL IMPRESSION: Pt required min to mod cues to attend to inferior and far-left visual areas but red folder provided effective external cueing for compensatory scanning strategy. Continued skilled OT warranted to reinforce visual compensatory techniques and improve task efficiency in functional contexts.  PERFORMANCE DEFICITS: in functional skills including ADLs, IADLs, proprioception, endurance, and vision, cognitive skills including attention, memory, problem solving, safety awareness, and sequencing, and psychosocial skills including coping strategies, environmental adaptation, habits, and routines and behaviors.   IMPAIRMENTS: are limiting patient from ADLs, IADLs, leisure, and social participation.   CO-MORBIDITIES: has no other co-morbidities that affects occupational performance. Patient will benefit from  skilled OT to address above impairments and improve overall function.  REHAB POTENTIAL: Good   PLAN:  OT FREQUENCY: 2x/week  OT DURATION: 8 weeks  PLANNED INTERVENTIONS: 97168 OT Re-evaluation, 97535 self care/ADL training, 02889 therapeutic exercise, 97530 therapeutic activity, and 97112 neuromuscular re-education  RECOMMENDED OTHER SERVICES: NA  CONSULTED AND AGREED WITH PLAN OF CARE: Patient and family member/caregiver  PLAN FOR NEXT SESSION:  check remaining STG,  continue to review and practice visual compensation strategies in functional tasks, environmental scanning, tabletop scanning activities   Consider 2 color external cue to L side with scanning ie) top and bottom visual field   Clarita LITTIE Pride, OTR/L 12/20/2023, 12:29 PM

## 2023-12-21 NOTE — Progress Notes (Signed)
 Cardiology Office Note:   Date:  12/21/2023  ID:  Terry Hays, DOB 1950/04/01, MRN [id number] PCP: [name], MD  [facility] Providers Cardiologist:  [name], MD { Chief Complaint: No chief complaint on file.     History of Present Illness:   Terry Hays is a 73 y.o. male with a PMH of HTN, HLD, prior CVA who presents for follow up.   Interval History 12/22/23: - Patient seen by me for follow-up in clinic on 12/03/2023 after suffering CVA. - I performed a TEE on the patient and found that he has a large PFO - He presents today to discuss these findings   Past Medical History:  Diagnosis Date   Abnormal ECG    Allergy    Anxiety    FLYING   Diverticulosis    Dyslipidemia    History of prostate cancer    Hyperlipidemia    Hypertension    LVH (left ventricular hypertrophy)    Pre-diabetes      Studies Reviewed:    EKG: ***       Cardiac Studies & Procedures   ______________________________________________________________________________________________     ECHOCARDIOGRAM  ECHOCARDIOGRAM COMPLETE 09/09/2023  Narrative ECHOCARDIOGRAM REPORT    Patient Name:   Terry Hays [facility] Date of Exam: 09/09/2023 Medical Rec #:  [id number]         Height:       68.0 in Accession #:    [id number]        Weight:       183.0 lb Date of Birth:  [birth year]         BSA:          1.968 m Patient Age:    73 years          BP:           127/50 mmHg Patient Gender: M                 HR:           69 bpm. Exam Location:  Inpatient  Procedure: 2D Echo, Cardiac Doppler, Color Doppler and Intracardiac Opacification Agent (Both Spectral and Color Flow Doppler were utilized during procedure).  Indications:    Stroke  History:        Patient has prior history of Echocardiogram examinations, most recent 10/03/2007. Risk Factors:Hypertension and Dyslipidemia.  Sonographer:    Therisa Crouch Referring Phys: [id number] VISHAL R PATEL  IMPRESSIONS   1.  Left ventricular ejection fraction, by estimation, is 60 to 65%. The left ventricle has normal function. The left ventricle has no regional wall motion abnormalities. There is moderate concentric left ventricular hypertrophy. Left ventricular diastolic parameters are indeterminate. 2. Right ventricular systolic function is normal. The right ventricular size is normal. There is normal pulmonary artery systolic pressure. 3. Left atrial size was moderately dilated. 4. The mitral valve is normal in structure. Trivial mitral valve regurgitation. No evidence of mitral stenosis. 5. The aortic valve is tricuspid. Aortic valve regurgitation is not visualized. No aortic stenosis is present. 6. The inferior vena cava is normal in size with greater than 50% respiratory variability, suggesting right atrial pressure of 3 mmHg.  Comparison(s): Prior images unable to be directly viewed, comparison made by report only.  Conclusion(s)/Recommendation(s): Normal biventricular function without evidence of hemodynamically significant valvular heart disease. No intracardiac source of embolism detected on this transthoracic study. Consider a transesophageal echocardiogram to exclude cardiac source of embolism if clinically indicated. No  left ventricular mural or apical thrombus/thrombi.  FINDINGS Left Ventricle: Left ventricular ejection fraction, by estimation, is 60 to 65%. The left ventricle has normal function. The left ventricle has no regional wall motion abnormalities. Definity  contrast agent was given IV to delineate the left ventricular endocardial borders. The left ventricular internal cavity size was normal in size. There is moderate concentric left ventricular hypertrophy. Left ventricular diastolic parameters are indeterminate.  Right Ventricle: The right ventricular size is normal. No increase in right ventricular wall thickness. Right ventricular systolic function is normal. There is normal pulmonary artery  systolic pressure. The tricuspid regurgitant velocity is 2.47 m/s, and with an assumed right atrial pressure of 3 mmHg, the estimated right ventricular systolic pressure is 27.4 mmHg.  Left Atrium: Left atrial size was moderately dilated.  Right Atrium: Right atrial size was normal in size.  Pericardium: There is no evidence of pericardial effusion.  Mitral Valve: The mitral valve is normal in structure. Trivial mitral valve regurgitation. No evidence of mitral valve stenosis.  Tricuspid Valve: The tricuspid valve is normal in structure. Tricuspid valve regurgitation is trivial. No evidence of tricuspid stenosis.  Aortic Valve: The aortic valve is tricuspid. Aortic valve regurgitation is not visualized. No aortic stenosis is present.  Pulmonic Valve: The pulmonic valve was not well visualized. Pulmonic valve regurgitation is trivial. No evidence of pulmonic stenosis.  Aorta: The aortic root, ascending aorta and aortic arch are all structurally normal, with no evidence of dilitation or obstruction.  Venous: The inferior vena cava is normal in size with greater than 50% respiratory variability, suggesting right atrial pressure of 3 mmHg.  IAS/Shunts: The atrial septum is grossly normal.   LEFT VENTRICLE PLAX 2D LVIDd:         4.40 cm      Diastology LVIDs:         2.80 cm      LV e' medial:    7.29 cm/s LV PW:         1.61 cm      LV E/e' medial:  6.4 LV IVS:        1.50 cm      LV e' lateral:   9.03 cm/s LVOT diam:     2.20 cm      LV E/e' lateral: 5.2 LVOT Area:     3.80 cm  LV Volumes (MOD) LV vol d, MOD A2C: 86.6 ml LV vol d, MOD A4C: 122.0 ml LV vol s, MOD A2C: 34.0 ml LV vol s, MOD A4C: 47.1 ml LV SV MOD A2C:     52.6 ml LV SV MOD A4C:     122.0 ml LV SV MOD BP:      68.4 ml  RIGHT VENTRICLE             IVC RV Basal diam:  2.50 cm     IVC diam: 1.70 cm RV S prime:     16.80 cm/s TAPSE (M-mode): 1.9 cm  LEFT ATRIUM             Index LA diam:        4.60 cm 2.34  cm/m LA Vol (A2C):   74.2 ml 37.70 ml/m LA Vol (A4C):   89.0 ml 45.22 ml/m LA Biplane Vol: 82.8 ml 42.07 ml/m  AORTA Ao Root diam: 3.70 cm Ao Asc diam:  3.00 cm  MITRAL VALVE               TRICUSPID VALVE MV Area (PHT): 4.54  cm    TR Peak grad:   24.4 mmHg MV Decel Time: 167 msec    TR Vmax:        247.00 cm/s MV E velocity: 46.70 cm/s MV A velocity: 49.30 cm/s  SHUNTS MV E/A ratio:  0.95        Systemic Diam: 2.20 cm  Shelda Bruckner MD Electronically signed by Shelda Bruckner MD Signature Date/Time: 09/09/2023/2:02:15 PM    Final   TEE  ECHO TEE 12/13/2023  Narrative TRANSESOPHOGEAL ECHO REPORT    Patient Name:   Terry Hays Date of Exam: 12/13/2023 Medical Rec #:  [id number]         Height:       68.0 in Accession #:    [id number]        Weight:       185.8 lb Date of Birth:  19-Feb-1951         BSA:          1.981 m Patient Age:    73 years          BP:           149/74 mmHg Patient Gender: M                 HR:           51 bpm. Exam Location:  Inpatient  Procedure: Transesophageal Echo, Limited Color Doppler, Cardiac Doppler and Saline Contrast Bubble Study (Both Spectral and Color Flow Doppler were utilized during procedure).  Indications:     PFO  History:         Patient has prior history of Echocardiogram examinations, most recent 09/09/2023. CAD, CVA; Risk Factors:Dyslipidemia, Hypertension and hx of cancer, pre-diabetes.  Sonographer:     Koleen Popper RDCS Referring Phys:  [id number] [name] Diagnosing Phys: [name]  PROCEDURE: After discussion of the risks and benefits of a TEE, an informed consent was obtained from the patient. The transesophogeal probe was passed without difficulty through the esophogus of the patient. Imaged were obtained with the patient in a left lateral decubitus position. Sedation performed by different physician. The patient was monitored while under deep sedation. Anesthestetic sedation was  provided intravenously by Anesthesiology: 190mg  of Propofol , 80mg  of Lidocaine . Image quality was excellent. The patient's vital signs; including heart rate, blood pressure, and oxygen saturation; remained stable throughout the procedure. The patient developed no complications during the procedure.  IMPRESSIONS   1. Left ventricular ejection fraction, by estimation, is 55 to 60%. The left ventricle has normal function. The left ventricle has no regional wall motion abnormalities. There is moderate concentric left ventricular hypertrophy. 2. Right ventricular systolic function is normal. The right ventricular size is normal. 3. No left atrial/left atrial appendage thrombus was detected. 4. The mitral valve is normal in structure. Mild mitral valve regurgitation. 5. Small fibrinous strand on the ventricular side of the AV favored to be a lambl's excresence. . The aortic valve is tricuspid. Aortic valve regurgitation is trivial. No aortic stenosis is present. 6. Definitive PFO by color doppler and agitated saline.. Agitated saline contrast bubble study was positive with shunting observed within 3-6 cardiac cycles suggestive of interatrial shunt. There is a patent foramen ovale with predominantly left to right shunting across the atrial septum.  Conclusion(s)/Recommendation(s): TEE demonstrates large PFO which is evident by both color doppler and agitated saline. Shunting accross the defect is left to right. The interatrial septum is aneurysmal. No LA or LA appendage masses or  thrombi. Small fibrinous strand on the ventricular side of the AV favored to be a lambl's excrescence.  FINDINGS Left Ventricle: Left ventricular ejection fraction, by estimation, is 55 to 60%. The left ventricle has normal function. The left ventricle has no regional wall motion abnormalities. The left ventricular internal cavity size was normal in size. There is moderate concentric left ventricular hypertrophy.  Right  Ventricle: The right ventricular size is normal. Right vetricular wall thickness was not assessed. Right ventricular systolic function is normal.  Left Atrium: Left atrial size was normal in size. No left atrial/left atrial appendage thrombus was detected.  Right Atrium: Right atrial size was normal in size.  Pericardium: There is no evidence of pericardial effusion.  Mitral Valve: The mitral valve is normal in structure. Mild mitral valve regurgitation.  Tricuspid Valve: The tricuspid valve is normal in structure. Tricuspid valve regurgitation is trivial.  Aortic Valve: Small fibrinous strand on the ventricular side of the AV favored to be a lambl's excresence. The aortic valve is tricuspid. Aortic valve regurgitation is trivial. No aortic stenosis is present.  Pulmonic Valve: The pulmonic valve was normal in structure. Pulmonic valve regurgitation is trivial.  Aorta: The aortic root, ascending aorta and aortic arch are all structurally normal, with no evidence of dilitation or obstruction.  IAS/Shunts: The interatrial septum is aneurysmal. Definitive PFO by color doppler and agitated saline. Agitated saline contrast was given intravenously to evaluate for intracardiac shunting. Agitated saline contrast bubble study was positive with shunting observed within 3-6 cardiac cycles suggestive of interatrial shunt. There is no evidence of an atrial septal defect.   LEFT VENTRICLE PLAX 2D LVIDd:         0.03 cm LV IVS:        1.54 cm LVOT diam:     2.50 cm LVOT Area:     4.91 cm    AORTA Ao Root diam: 3.70 cm Ao Asc diam:  3.00 cm   SHUNTS Systemic Diam: 2.50 cm  [name] Electronically signed by [name] Signature Date/Time: 12/13/2023/2:09:42 PM    Final        ______________________________________________________________________________________________      Risk Assessment/Calculations:   {Does this patient have ATRIAL FIBRILLATION?:[id number]} No  BP recorded.  {Refresh Note OR Click here to enter BP  :1}***        Physical Exam:     VS:  There were no vitals taken for this visit. ***    Wt Readings from Last 3 Encounters:  12/13/23 182 lb (82.6 kg)  12/08/23 185 lb 12.8 oz (84.3 kg)  12/03/23 190 lb 9.6 oz (86.5 kg)     GEN: Well nourished, well developed, in no acute distress NECK: No JVD; No carotid bruits CARDIAC: ***RRR, no murmurs, rubs, gallops RESPIRATORY:  Clear to auscultation without rales, wheezing or rhonchi  ABDOMEN: Soft, non-tender, non-distended, normal bowel sounds EXTREMITIES:  Warm and well perfused, no edema; No deformity, 2+ radial pulses PSYCH: Normal mood and affect   Assessment & Plan       {Are you ordering a CV Procedure (e.g. stress test, cath, DCCV, TEE, etc)?   Press F2        :[id number]}   This note was written with the assistance of a dictation microphone or AI dictation software. Please excuse any typos or grammatical errors.   Signed, [name], MD 12/21/2023 1:48 PM    [facility]

## 2023-12-22 ENCOUNTER — Ambulatory Visit
Attending: Student in an Organized Health Care Education/Training Program | Admitting: Student in an Organized Health Care Education/Training Program

## 2023-12-22 ENCOUNTER — Ambulatory Visit: Admitting: Occupational Therapy

## 2023-12-22 ENCOUNTER — Encounter: Payer: Self-pay | Admitting: Student in an Organized Health Care Education/Training Program

## 2023-12-22 ENCOUNTER — Ambulatory Visit: Payer: Self-pay | Admitting: Cardiology

## 2023-12-22 ENCOUNTER — Ambulatory Visit

## 2023-12-22 ENCOUNTER — Encounter: Admitting: Occupational Therapy

## 2023-12-22 ENCOUNTER — Encounter

## 2023-12-22 VITALS — BP 118/78 | HR 56 | Ht 68.0 in | Wt 187.6 lb

## 2023-12-22 DIAGNOSIS — R2681 Unsteadiness on feet: Secondary | ICD-10-CM | POA: Diagnosis not present

## 2023-12-22 DIAGNOSIS — H5410 Blindness, one eye, low vision other eye, unspecified eyes: Secondary | ICD-10-CM

## 2023-12-22 DIAGNOSIS — R41840 Attention and concentration deficit: Secondary | ICD-10-CM | POA: Diagnosis not present

## 2023-12-22 DIAGNOSIS — Z8673 Personal history of transient ischemic attack (TIA), and cerebral infarction without residual deficits: Secondary | ICD-10-CM | POA: Diagnosis not present

## 2023-12-22 DIAGNOSIS — M6281 Muscle weakness (generalized): Secondary | ICD-10-CM | POA: Diagnosis not present

## 2023-12-22 DIAGNOSIS — R278 Other lack of coordination: Secondary | ICD-10-CM | POA: Diagnosis not present

## 2023-12-22 DIAGNOSIS — R2689 Other abnormalities of gait and mobility: Secondary | ICD-10-CM | POA: Diagnosis not present

## 2023-12-22 DIAGNOSIS — R41842 Visuospatial deficit: Secondary | ICD-10-CM | POA: Diagnosis not present

## 2023-12-22 DIAGNOSIS — H53462 Homonymous bilateral field defects, left side: Secondary | ICD-10-CM | POA: Diagnosis not present

## 2023-12-22 DIAGNOSIS — R001 Bradycardia, unspecified: Secondary | ICD-10-CM

## 2023-12-22 DIAGNOSIS — I639 Cerebral infarction, unspecified: Secondary | ICD-10-CM

## 2023-12-22 DIAGNOSIS — R41844 Frontal lobe and executive function deficit: Secondary | ICD-10-CM

## 2023-12-22 NOTE — Therapy (Signed)
 OUTPATIENT OCCUPATIONAL THERAPY NEURO TREATMENT AND PROGRESS NOTE  Patient Name: Terry Hays MRN: [id number] DOB:November 22, 1950, 73 y.o., male Today's Date: 12/22/2023  PCP: [name], MD REFERRING PROVIDER: Bulah Alm RAMAN, PA-C  END OF SESSION:  OT End of Session - 12/22/23 1037     Visit Number 10    Number of Visits 17    Date for Recertification  01/05/24    Authorization Type [employer] PPO--17 visits approved 11/10/23-01/05/24    Authorization - Number of Visits 17    Progress Note Due on Visit 20    OT Start Time 1038   pt arrived 8 min late to therapy session   OT Stop Time 1109    OT Time Calculation (min) 31 min    Equipment Utilized During Treatment Cards/puzzle    Activity Tolerance Patient tolerated treatment well    Behavior During Therapy [facility] for tasks assessed/performed         Past Medical History:  Diagnosis Date   Abnormal ECG    Allergy    Anxiety    FLYING   Diverticulosis    Dyslipidemia    History of prostate cancer    Hyperlipidemia    Hypertension    LVH (left ventricular hypertrophy)    Pre-diabetes    Past Surgical History:  Procedure Laterality Date   COLONOSCOPY  2004 2011   MAGOD   EYE SURGERY  03/10/1999   CATOACTS BOTH   HERNIA REPAIR  03/09/1952   R INGHINAL   INGUINAL HERNIA REPAIR Left 04/07/2018   Procedure: OPERN LEFT INGUINAL HERNIA REPAIR WITH MESH ERAS PATHWAY;  Surgeon: Tanda Locus, MD;  Location: [facility];  Service: General;  Laterality: Left;   LOOP RECORDER INSERTION N/A 09/17/2023   Procedure: LOOP RECORDER INSERTION;  Surgeon: Lesia Ozell Barter, PA-C;  Location: [facility] INVASIVE CV LAB;  Service: Cardiovascular;  Laterality: N/A;   PENILE PROSTHESIS IMPLANT  03/09/2009   DAHLSTEDT   PROSTATE SURGERY  03/09/2001   PROSTRATECTOMY   TRANSESOPHAGEAL ECHOCARDIOGRAM (CATH LAB) N/A 12/13/2023   Procedure: TRANSESOPHAGEAL ECHOCARDIOGRAM;  Surgeon: Floretta Mallard, MD;  Location: [facility] INVASIVE CV LAB;   Service: Cardiovascular;  Laterality: N/A;   Patient Active Problem List   Diagnosis Date Noted   Coronary artery disease involving native coronary artery of native heart without angina pectoris 12/03/2023   History of CVA (cerebrovascular accident) 11/06/2023   Cognitive impairment 09/12/2023   Right lower lobe pulmonary nodule 09/08/2023   Attention deficit hyperactivity disorder (ADHD), predominantly hyperactive type 06/03/2021   Cherry angioma 06/03/2021   Seborrheic keratosis 06/03/2021   Arthritis 03/17/2018   Spinal stenosis of lumbosacral region 03/17/2018   Actinic keratosis 03/17/2018   Degeneration of lumbar intervertebral disc 10/27/2017   History of prostate cancer 09/15/2011   Fear of flying 09/15/2011   Family history of heart disease in male family member before age 16 09/15/2011   Allergic rhinitis 09/15/2011   Hemorrhoids 09/15/2011   Hypertension 06/27/2010   Dyslipidemia    LVH (left ventricular hypertrophy)     ONSET DATE: late June 2025  REFERRING DIAG: Z86.73, Z92.89, H54.7  THERAPY DIAG:  Visuospatial deficit  Hemianopia, homonymous, left  Other lack of coordination  Attention and concentration deficit  Frontal lobe and executive function deficit  Muscle weakness (generalized)  Blindness, one eye, low vision other eye, unspecified eyes  Cerebrovascular accident (CVA), unspecified mechanism (HCC)  Rationale for Evaluation and Treatment: Rehabilitation  SUBJECTIVE:   SUBJECTIVE STATEMENT: Pt reports he walked  to his appointment today to give his wife a break. He reports this is exhausting, but he wears his ball cap and sunglasses to help with eye fatigue.   Denies pain and no falls  Pt accompanied by: self  PERTINENT HISTORY: CVA 09/08/23, hx of falls  PRECAUTIONS: None  WEIGHT BEARING RESTRICTIONS: No  PAIN:  Are you having pain? No  FALLS: Has patient fallen in last 6 months? Yes. Number of falls unclear, but fell a couple  weeks ago Not paying attention and has had stumbles but not falls.  LIVING ENVIRONMENT: Lives with: lives with their spouse Lives in: House/apartment Stairs: Yes: Internal: unclear but has a rail for internal steps steps; steps to enter home with rail and rail doing downstairs Has following equipment at home: Single point cane  PLOF: Independent, Independent with basic ADLs, Independent with household mobility without device, and Independent with transfers  PATIENT GOALS: Pt would like to be able use his computer again for email, typing in word, etc. Would also love to be able to golf, play pickle ball etc. Ultimate goal is to drive.  OBJECTIVE:  Note: Objective measures were completed at Evaluation unless otherwise noted.  HAND DOMINANCE: Left  ADLs: Overall ADL: Mod I at this time, using a SPC for mobility.   IADLs: Overall pt is not driving and has assist for grocery shopping, bills, cooking, IADLs at this time.  Handwriting: 90% legible  MOBILITY STATUS: Hx of falls and needs SPC  POSTURE COMMENTS:  rounded shoulders Sitting balance: decreased core strength noted but overall demonstrates functional sitting balance.   ACTIVITY TOLERANCE: Activity tolerance: decreased but functional for ADL tasks, not able to do IADL tasks such as golf and pickleball  FUNCTIONAL OUTCOME MEASURES: NA - pt truly does not feel like he has LUE deficits from CVA and wants to work on vision  UPPER EXTREMITY ROM:    Active ROM Right eval Left eval  Shoulder flexion    Shoulder abduction    Shoulder adduction    Shoulder extension    Shoulder internal rotation    Shoulder external rotation    Elbow flexion    Elbow extension    Wrist flexion    Wrist extension    Wrist ulnar deviation    Wrist radial deviation    Wrist pronation    Wrist supination    (Blank rows = not tested)  UPPER EXTREMITY MMT:     MMT Right eval Left eval  Shoulder flexion 150 WFL  Shoulder abduction     Shoulder adduction    Shoulder extension    Shoulder internal rotation    Shoulder external rotation    Middle trapezius    Lower trapezius    Elbow flexion    Elbow extension    Wrist flexion    Wrist extension    Wrist ulnar deviation    Wrist radial deviation    Wrist pronation    Wrist supination    (Blank rows = not tested)  Note mild deficits in B shoulders for shoulder flexion 2/2 old injuries from falls   HAND FUNCTION: WFL - note pt verbose and arrived late to evaluation, did not have time to asses grip strength as pt wanted to focus on vision. Will plan to assess in future sessions.   COORDINATION: Finger Nose Finger test: dysmetria and decreased coordination noted but unclear if this is from visual deficits to aim for other hand  11/25/23: 9 hole peg test: RT =  49.58 sec (slower d/t missing far Lt side and difficulty following directions even after multiple cues) Lt = 44.70 (difficulty following directions)   SENSATION: WFL  EDEMA: none  MUSCLE TONE: RUE: Within functional limits and LUE: Within functional limits  COGNITION: Overall cognitive status: Impaired and Difficulty to assess due to: Note pt does not think deficits are significant but statements from S.O. in room states that cognition has been limited for comprehension, short term memory, and higher level tasks.   VISION: Subjective report: Pt reports he cannot see anything on the left - like its not even there. States he has learned various scanning and compensatory techniques.  Baseline vision: no glassess but demonstrates hemianopsia  Visual history: none prior to CVA   VISION ASSESSMENT: Impaired To be further assessed in functional context Eye alignment: Impaired:   Tracking/Visual pursuits: Decreased smoothness with horizontal tracking, Decreased smoothness with vertical tracking, Decreased smoothness of eye movement to Left superior field, Decreased smoothness of eye movement to Left inferior  field, and Requires cues, head turns, or add eye shifts to track Visual Fields: Left homonymous hemianopsia  Patient has difficulty with following activities due to following visual impairments: driving, cooking, pickleball, golfing, has ad recent falls.   PERCEPTION: Impaired: Inattention/neglect: does not attend to left visual field  PRAXIS: Not tested  OBSERVATIONS: The pt is a 73 yo male who is accompanied by his S.O. this date walking with a SPC who demonstrates dignificant visual deficits post CVA. Pt demonstrates slight deficits in BUE shoulder ROM but is fairly symmetrical and good MMT - does not feel like strength has been impacted from CVA from a neuro stand point. Pt with significant visual field deficits, L hemianopsia, and decreased attention to L visual field impacting skill performance.                                                                                                                             TREATMENT:    - Self-care/home management completed for duration as noted below including:  Objective measures assessed as noted in Goals section to determine progression towards goals.  Therapist reviewed goals with patient and updated patient progression.    Pt completed double number cross out 1.75M with use of line guide and increased time and 91% accuracy for visual scanning.   PATIENT EDUCATION: Education details:  Goal review and visual scanning Person educated: Patient Education method: Explanation, Demonstration, and Verbal cues Education comprehension: verbalized understanding, returned demonstration, verbal cues required, and needs further education  HOME EXERCISE PROGRAM: 11/22/23:  Visual HEP and compensation strategies  11/25/23: coordination HEP  12/20/23: Golf Solitaire  GOALS: Goals reviewed with patient? Yes  SHORT TERM GOALS: Target date: 12/08/23  Pt will be Supervision with vision specific HEP to further integrate strategies and scanning into  daily life for max recovery and benefit.  Baseline: Pt does incooperate techniques from hospital but would benefit from further training  Goal status: MET  2.  Pt will demonstrate improved visual scanning to locate 5 items in a room with no more than 2 verbal cues to locate items.  Baseline:  5 items, needing 3+ cues for scanning Goal status: MET in min distracting environment.  12/06/23  3.  Pt will demonstrate improved visual acuity and/or use of adaptations to use his personal computer for leisure activities such as email, typing in Word, etc with no more than Supervision.  Baseline: Pt reports significant difficulty with this Goal status: PARTIALLY MET w/ occasional cues (12/09/23 - Needed mod cueing to send email, use word, etc) 12/23/2023 - using phone more than computer  4.  Pt will perform a visual scanning task/worksheet of choice with no more than 2 errors in order to demonstrate improved scanning and acuity.  Goal status: Ongoing  12/06/23 1.1M scanning sheet with 7 errors (93%)  12/22/2023: 1.1M double cancellation sheet (91% accuracy)  LONG TERM GOALS: Target date: 01/05/24  Pt will be MOD I with vision specific HEP to further integrate strategies and scanning into daily life for max recovery and benefit.  Baseline: Pt does incooperate techniques from hospital but would benefit from further training  Goal status: IN Progress  2.  Pt will demonstrate improved visual scanning to locate 5 items in a room with no verbal cues to locate items.  Baseline:  5 items, needing 3+ cues for scanning Goal status: INITIAL  3.  Pt will demonstrate improved visual acuity and/or use of adaptations to use his personal computer for leisure activities such as email, typing in Word, etc with Independence. Baseline: Pt reports significant difficulty with this Goal status: INITIAL  4.  If safe and appropriate, pt will report a return to some leisure activities such as modified golf, pickle ball,  walking, etc with use of strategies, AE, or compensatory techniques to improve safety and participation.  Baseline: not safe and not performing at this time 12/22/2023: pt able to walk to therapy appointment today Goal status: IN PROGRESS  ASSESSMENT:  CLINICAL IMPRESSION: This 10th progress note is for dates: 11/10/2023 to 12/22/2023. Pt has met 2/4 STGs and 0/4 LTGs while making progress towards all goals. Pt continues to benefit from skilled OT services in the outpatient setting to work towards remaining goals or until max rehab potential is met.   PERFORMANCE DEFICITS: in functional skills including ADLs, IADLs, proprioception, endurance, and vision, cognitive skills including attention, memory, problem solving, safety awareness, and sequencing, and psychosocial skills including coping strategies, environmental adaptation, habits, and routines and behaviors.   IMPAIRMENTS: are limiting patient from ADLs, IADLs, leisure, and social participation.   CO-MORBIDITIES: has no other co-morbidities that affects occupational performance. Patient will benefit from skilled OT to address above impairments and improve overall function.  REHAB POTENTIAL: Good   PLAN:  OT FREQUENCY: 2x/week  OT DURATION: 8 weeks  PLANNED INTERVENTIONS: 97168 OT Re-evaluation, 97535 self care/ADL training, 02889 therapeutic exercise, 97530 therapeutic activity, and 97112 neuromuscular re-education  RECOMMENDED OTHER SERVICES: NA  CONSULTED AND AGREED WITH PLAN OF CARE: Patient and family member/caregiver  PLAN FOR NEXT SESSION:  [email or web address]  continue to review and practice visual compensation strategies in functional tasks, environmental scanning, tabletop scanning activities   Consider 2 color external cue to L side with scanning ie) top and bottom visual field   Jocelyn CHRISTELLA Bottom, OTR/L 12/22/2023, 4:18 PM

## 2023-12-22 NOTE — Patient Instructions (Signed)
 Medication Instructions:  No medication changes were made at this visit. Continue current regimen.   *If you need a refill on your cardiac medications before your next appointment, please call your pharmacy*  Lab Work: None ordered today. If you have labs (blood work) drawn today and your tests are completely normal, you will receive your results only by: MyChart Message (if you have MyChart) OR A paper copy in the mail If you have any lab test that is abnormal or we need to change your treatment, we will call you to review the results.  Testing/Procedures: None ordered today.  Follow-Up: At [facility], you and your health needs are our priority.  As part of our continuing mission to provide you with exceptional heart care, our providers are all part of one team.  This team includes your primary Cardiologist (physician) and Advanced Practice Providers or APPs (Physician Assistants and Nurse Practitioners) who all work together to provide you with the care you need, when you need it.  Your next appointment:    01/20/24 at 9 AM with Dr. Wendel   September 2026 with [name], MD    We recommend signing up for the patient portal called MyChart.  Sign up information is provided on this After Visit Summary.  MyChart is used to connect with patients for Virtual Visits (Telemedicine).  Patients are able to view lab/test results, encounter notes, upcoming appointments, etc.  Non-urgent messages can be sent to your provider as well.   To learn more about what you can do with MyChart, go to [email or web address].   Other Instructions You have been referred to our structural heart team discuss PFO closure. You have an appointment with Dr. Wendel on 01/20/24 at 9 AM.

## 2023-12-22 NOTE — Assessment & Plan Note (Addendum)
 Patient presents for follow-up regarding his TEE results.  His TEE demonstrates a large PFO with a large shunt.  I calculated the patient's ROPE score = 4 which puts him at a 38% chance that his stroke was secondary to his PFO and a 12% risk of recurrence of his stroke in 2 years.  I had a long discussion with the patient and his wife regarding what a PFO is and how it can be associated with CVA, but is not definitive.  The patient has had 1 clinical stroke and several subclinical CVAs.  Neurology has evaluated the patient and felt that his strokes could be cardioembolic in etiology.  Most of the data for PFO closure is seen in patients <91 years old, but I feel that given how significant his PFO shunt is and the appearance of his strokes on imaging are certainly concerning for his PFO being at play.  After discussing the option of PFO closure vs continuing medical management, the patient and I feel that it is appropriate for him to undergo evaluation for PFO closure.  I will refer him to structural cardiology for PFO closure at this time. - Refer to structural cardiology - Continue ILR monitoring - Follow-up in 12 months

## 2023-12-22 NOTE — Progress Notes (Signed)
 Remote Loop Recorder Transmission

## 2023-12-24 DIAGNOSIS — D2261 Melanocytic nevi of right upper limb, including shoulder: Secondary | ICD-10-CM | POA: Diagnosis not present

## 2023-12-24 DIAGNOSIS — D485 Neoplasm of uncertain behavior of skin: Secondary | ICD-10-CM | POA: Diagnosis not present

## 2023-12-29 ENCOUNTER — Ambulatory Visit: Admitting: Occupational Therapy

## 2023-12-29 ENCOUNTER — Ambulatory Visit: Payer: Self-pay | Admitting: Internal Medicine

## 2023-12-29 ENCOUNTER — Encounter: Payer: Self-pay | Admitting: Occupational Therapy

## 2023-12-29 DIAGNOSIS — R41844 Frontal lobe and executive function deficit: Secondary | ICD-10-CM

## 2023-12-29 DIAGNOSIS — H53462 Homonymous bilateral field defects, left side: Secondary | ICD-10-CM | POA: Diagnosis not present

## 2023-12-29 DIAGNOSIS — R2689 Other abnormalities of gait and mobility: Secondary | ICD-10-CM | POA: Diagnosis not present

## 2023-12-29 DIAGNOSIS — I639 Cerebral infarction, unspecified: Secondary | ICD-10-CM | POA: Diagnosis not present

## 2023-12-29 DIAGNOSIS — R41842 Visuospatial deficit: Secondary | ICD-10-CM | POA: Diagnosis not present

## 2023-12-29 DIAGNOSIS — R41840 Attention and concentration deficit: Secondary | ICD-10-CM | POA: Diagnosis not present

## 2023-12-29 DIAGNOSIS — R278 Other lack of coordination: Secondary | ICD-10-CM | POA: Diagnosis not present

## 2023-12-29 DIAGNOSIS — R2681 Unsteadiness on feet: Secondary | ICD-10-CM | POA: Diagnosis not present

## 2023-12-29 DIAGNOSIS — M6281 Muscle weakness (generalized): Secondary | ICD-10-CM | POA: Diagnosis not present

## 2023-12-29 NOTE — Therapy (Signed)
 OUTPATIENT OCCUPATIONAL THERAPY NEURO TREATMENT   Patient Name: Terry Hays MRN: [id number] DOB:10/13/1950, 73 y.o., male Today's Date: 12/29/2023  PCP: [name], MD REFERRING PROVIDER: Bulah Alm RAMAN, PA-C  END OF SESSION:  OT End of Session - 12/29/23 1109     Visit Number 11    Number of Visits 17    Date for Recertification  01/05/24    Authorization Type [employer] PPO--17 visits approved 11/10/23-01/05/24    Authorization - Number of Visits 17    Progress Note Due on Visit 20    OT Start Time 1106    OT Stop Time 1146    OT Time Calculation (min) 40 min    Activity Tolerance Patient tolerated treatment well    Behavior During Therapy [facility] for tasks assessed/performed         Past Medical History:  Diagnosis Date   Abnormal ECG    Allergy    Anxiety    FLYING   Diverticulosis    Dyslipidemia    History of prostate cancer    Hyperlipidemia    Hypertension    LVH (left ventricular hypertrophy)    Pre-diabetes    Past Surgical History:  Procedure Laterality Date   COLONOSCOPY  2004 2011   MAGOD   EYE SURGERY  03/10/1999   CATOACTS BOTH   HERNIA REPAIR  03/09/1952   R INGHINAL   INGUINAL HERNIA REPAIR Left 04/07/2018   Procedure: OPERN LEFT INGUINAL HERNIA REPAIR WITH MESH ERAS PATHWAY;  Surgeon: Tanda Locus, MD;  Location: [facility];  Service: General;  Laterality: Left;   LOOP RECORDER INSERTION N/A 09/17/2023   Procedure: LOOP RECORDER INSERTION;  Surgeon: Lesia Ozell Barter, PA-C;  Location: [facility] INVASIVE CV LAB;  Service: Cardiovascular;  Laterality: N/A;   PENILE PROSTHESIS IMPLANT  03/09/2009   DAHLSTEDT   PROSTATE SURGERY  03/09/2001   PROSTRATECTOMY   TRANSESOPHAGEAL ECHOCARDIOGRAM (CATH LAB) N/A 12/13/2023   Procedure: TRANSESOPHAGEAL ECHOCARDIOGRAM;  Surgeon: Floretta Mallard, MD;  Location: [facility] INVASIVE CV LAB;  Service: Cardiovascular;  Laterality: N/A;   Patient Active Problem List   Diagnosis Date Noted   Coronary  artery disease involving native coronary artery of native heart without angina pectoris 12/03/2023   History of CVA (cerebrovascular accident) 11/06/2023   Cognitive impairment 09/12/2023   Right lower lobe pulmonary nodule 09/08/2023   Attention deficit hyperactivity disorder (ADHD), predominantly hyperactive type 06/03/2021   Cherry angioma 06/03/2021   Seborrheic keratosis 06/03/2021   Arthritis 03/17/2018   Spinal stenosis of lumbosacral region 03/17/2018   Actinic keratosis 03/17/2018   Degeneration of lumbar intervertebral disc 10/27/2017   History of prostate cancer 09/15/2011   Fear of flying 09/15/2011   Family history of heart disease in male family member before age 42 09/15/2011   Allergic rhinitis 09/15/2011   Hemorrhoids 09/15/2011   Hypertension 06/27/2010   Dyslipidemia    LVH (left ventricular hypertrophy)     ONSET DATE: late June 2025  REFERRING DIAG: Z86.73, Z92.89, H54.7  THERAPY DIAG:  Visuospatial deficit  Hemianopia, homonymous, left  Other lack of coordination  Attention and concentration deficit  Frontal lobe and executive function deficit  Rationale for Evaluation and Treatment: Rehabilitation  SUBJECTIVE:   SUBJECTIVE STATEMENT: Pt reports he walked to his appointment today again. Pt instructed NOT to do this d/t continued visual and spatial awareness deficits as well as decreased attention and being exhausted getting here. (Pt/wife also later reported major street crossing required at intersection with no  pedestrian signs/crossing)   Pt also played golf yesterday and was able to find the tee for the first time  Denies pain and no falls  Pt accompanied by: self  PERTINENT HISTORY: CVA 09/08/23, hx of falls  PRECAUTIONS: None  WEIGHT BEARING RESTRICTIONS: No  PAIN:  Are you having pain? No  FALLS: Has patient fallen in last 6 months? Yes. Number of falls unclear, but fell a couple weeks ago Not paying attention and has had stumbles  but not falls.  LIVING ENVIRONMENT: Lives with: lives with their spouse Lives in: House/apartment Stairs: Yes: Internal: unclear but has a rail for internal steps steps; steps to enter home with rail and rail doing downstairs Has following equipment at home: Single point cane  PLOF: Independent, Independent with basic ADLs, Independent with household mobility without device, and Independent with transfers  PATIENT GOALS: Pt would like to be able use his computer again for email, typing in word, etc. Would also love to be able to golf, play pickle ball etc. Ultimate goal is to drive.  OBJECTIVE:  Note: Objective measures were completed at Evaluation unless otherwise noted.  HAND DOMINANCE: Left  ADLs: Overall ADL: Mod I at this time, using a SPC for mobility.   IADLs: Overall pt is not driving and has assist for grocery shopping, bills, cooking, IADLs at this time.  Handwriting: 90% legible  MOBILITY STATUS: Hx of falls and needs SPC  POSTURE COMMENTS:  rounded shoulders Sitting balance: decreased core strength noted but overall demonstrates functional sitting balance.   ACTIVITY TOLERANCE: Activity tolerance: decreased but functional for ADL tasks, not able to do IADL tasks such as golf and pickleball  FUNCTIONAL OUTCOME MEASURES: NA - pt truly does not feel like he has LUE deficits from CVA and wants to work on vision  UPPER EXTREMITY ROM:    Active ROM Right eval Left eval  Shoulder flexion    Shoulder abduction    Shoulder adduction    Shoulder extension    Shoulder internal rotation    Shoulder external rotation    Elbow flexion    Elbow extension    Wrist flexion    Wrist extension    Wrist ulnar deviation    Wrist radial deviation    Wrist pronation    Wrist supination    (Blank rows = not tested)  UPPER EXTREMITY MMT:     MMT Right eval Left eval  Shoulder flexion 150 WFL  Shoulder abduction    Shoulder adduction    Shoulder extension     Shoulder internal rotation    Shoulder external rotation    Middle trapezius    Lower trapezius    Elbow flexion    Elbow extension    Wrist flexion    Wrist extension    Wrist ulnar deviation    Wrist radial deviation    Wrist pronation    Wrist supination    (Blank rows = not tested)  Note mild deficits in B shoulders for shoulder flexion 2/2 old injuries from falls   HAND FUNCTION: WFL - note pt verbose and arrived late to evaluation, did not have time to asses grip strength as pt wanted to focus on vision. Will plan to assess in future sessions.   COORDINATION: Finger Nose Finger test: dysmetria and decreased coordination noted but unclear if this is from visual deficits to aim for other hand  11/25/23: 9 hole peg test: RT = 49.58 sec (slower d/t missing far Lt side and  difficulty following directions even after multiple cues) Lt = 44.70 (difficulty following directions)   SENSATION: WFL  EDEMA: none  MUSCLE TONE: RUE: Within functional limits and LUE: Within functional limits  COGNITION: Overall cognitive status: Impaired and Difficulty to assess due to: Note pt does not think deficits are significant but statements from S.O. in room states that cognition has been limited for comprehension, short term memory, and higher level tasks.   VISION: Subjective report: Pt reports he cannot see anything on the left - like its not even there. States he has learned various scanning and compensatory techniques.  Baseline vision: no glassess but demonstrates hemianopsia  Visual history: none prior to CVA   VISION ASSESSMENT: Impaired To be further assessed in functional context Eye alignment: Impaired:   Tracking/Visual pursuits: Decreased smoothness with horizontal tracking, Decreased smoothness with vertical tracking, Decreased smoothness of eye movement to Left superior field, Decreased smoothness of eye movement to Left inferior field, and Requires cues, head turns, or add  eye shifts to track Visual Fields: Left homonymous hemianopsia  Patient has difficulty with following activities due to following visual impairments: driving, cooking, pickleball, golfing, has ad recent falls.   PERCEPTION: Impaired: Inattention/neglect: does not attend to left visual field  PRAXIS: Not tested  OBSERVATIONS: The pt is a 73 yo male who is accompanied by his S.O. this date walking with a SPC who demonstrates dignificant visual deficits post CVA. Pt demonstrates slight deficits in BUE shoulder ROM but is fairly symmetrical and good MMT - does not feel like strength has been impacted from CVA from a neuro stand point. Pt with significant visual field deficits, L hemianopsia, and decreased attention to L visual field impacting skill performance.                                                                                                                             TREATMENT:    Pt appeared exhausted when he arrived and reports he walked to clinic from home - he reports it's about a 2.5 mile walk - therapist advised him NOT to do this (for above reasons listed in subjective). Pt became upset with therapist but explained this is for his safety.  Wife came at end of session and discussed renewal vs. D/C and agreed to renew for 4 more weeks at end of next week. Also reviewed w/ wife why this therapist thinks it is unsafe for pt to walk to clinic (pt/wife also reported major street crossings as well w/ no cross walks)  Reviewed results of crossing out double numbers worksheet at 91% accuracy, and asked pt to find double numbers he missed. Pt required initial mod to max cueing to utilize line guide correctly including using Rt hand to hold paper in place to avoid correcting wrong line Pt able to find missing double numbers (w/ how many missed written for each line)   Pt then asked to cross out numbers in order (62M print  size) in which they appear for visual scanning, working memory,  sequencing - pt did numbers 1-20 w/ 1 error with last number.  Then did numbers 20-35 w/o errors  Attempted Kanoodle game for visual scanning, visual/perceptual skills: pt only able to complete game with 2 pcs missing and min cues to orient pieces correctly. Unable to finish w/ 3 pcs missing. Pt also w/ difficulty in object constancy (difficulty recognizing object if angled or turned differently)     PATIENT EDUCATION: Education details:  Goal review and visual scanning Person educated: Patient Education method: Explanation, Demonstration, and Verbal cues Education comprehension: verbalized understanding, returned demonstration, verbal cues required, and needs further education  HOME EXERCISE PROGRAM: 11/22/23:  Visual HEP and compensation strategies  11/25/23: coordination HEP  12/20/23: Golf Solitaire  GOALS: Goals reviewed with patient? Yes  SHORT TERM GOALS: Target date: 12/08/23  Pt will be Supervision with vision specific HEP to further integrate strategies and scanning into daily life for max recovery and benefit.  Baseline: Pt does incooperate techniques from hospital but would benefit from further training  Goal status: MET  2.  Pt will demonstrate improved visual scanning to locate 5 items in a room with no more than 2 verbal cues to locate items.  Baseline:  5 items, needing 3+ cues for scanning Goal status: MET in min distracting environment.  12/06/23  3.  Pt will demonstrate improved visual acuity and/or use of adaptations to use his personal computer for leisure activities such as email, typing in Word, etc with no more than Supervision.  Baseline: Pt reports significant difficulty with this Goal status: PARTIALLY MET w/ occasional cues (12/09/23 - Needed mod cueing to send email, use word, etc) 12/23/2023 - using phone more than computer  4.  Pt will perform a visual scanning task/worksheet of choice with no more than 2 errors in order to demonstrate improved scanning  and acuity.  Goal status: Ongoing  12/06/23 1.62M scanning sheet with 7 errors (93%)  12/22/2023: 1.62M double cancellation sheet (91% accuracy)  LONG TERM GOALS: Target date: 01/05/24  Pt will be MOD I with vision specific HEP to further integrate strategies and scanning into daily life for max recovery and benefit.  Baseline: Pt does incooperate techniques from hospital but would benefit from further training  Goal status: IN Progress  2.  Pt will demonstrate improved visual scanning to locate 5 items in a room with no verbal cues to locate items.  Baseline:  5 items, needing 3+ cues for scanning Goal status: INITIAL  3.  Pt will demonstrate improved visual acuity and/or use of adaptations to use his personal computer for leisure activities such as email, typing in Word, etc with Independence. Baseline: Pt reports significant difficulty with this Goal status: IN PROGRESS  4.  If safe and appropriate, pt will report a return to some leisure activities such as modified golf, pickle ball, walking, etc with use of strategies, AE, or compensatory techniques to improve safety and participation.  Baseline: not safe and not performing at this time 12/22/2023: pt able to walk to therapy appointment today Goal status: IN PROGRESS  ASSESSMENT:  CLINICAL IMPRESSION: Pt has met 2/4 STGs and 0/4 LTGs while making progress towards all goals. Pt continues to benefit from skilled OT services in the outpatient setting to work towards remaining goals or until max rehab potential is met.   PERFORMANCE DEFICITS: in functional skills including ADLs, IADLs, proprioception, endurance, and vision, cognitive skills including attention, memory, problem solving, safety  awareness, and sequencing, and psychosocial skills including coping strategies, environmental adaptation, habits, and routines and behaviors.   IMPAIRMENTS: are limiting patient from ADLs, IADLs, leisure, and social participation.    CO-MORBIDITIES: has no other co-morbidities that affects occupational performance. Patient will benefit from skilled OT to address above impairments and improve overall function.  REHAB POTENTIAL: Good   PLAN:  OT FREQUENCY: 2x/week  OT DURATION: 8 weeks  PLANNED INTERVENTIONS: 97168 OT Re-evaluation, 97535 self care/ADL training, 02889 therapeutic exercise, 97530 therapeutic activity, and 97112 neuromuscular re-education  RECOMMENDED OTHER SERVICES: NA  CONSULTED AND AGREED WITH PLAN OF CARE: Patient and family member/caregiver  PLAN FOR NEXT SESSION:  [email or web address]  environmental scanning, consider playing Connect 4, begin checking goals - plan to renew next week for 2x/wk for 4 weeks Consider 2 color external cue to L side with scanning ie) top and bottom visual field   Burnard JINNY Roads, OTR/L 12/29/2023, 11:10 AM

## 2023-12-31 ENCOUNTER — Encounter: Payer: Self-pay | Admitting: Physical Therapy

## 2023-12-31 ENCOUNTER — Ambulatory Visit: Admitting: Physical Therapy

## 2023-12-31 ENCOUNTER — Ambulatory Visit: Admitting: Occupational Therapy

## 2023-12-31 VITALS — BP 117/65 | HR 60

## 2023-12-31 DIAGNOSIS — R41844 Frontal lobe and executive function deficit: Secondary | ICD-10-CM | POA: Diagnosis not present

## 2023-12-31 DIAGNOSIS — H53462 Homonymous bilateral field defects, left side: Secondary | ICD-10-CM | POA: Diagnosis not present

## 2023-12-31 DIAGNOSIS — R41842 Visuospatial deficit: Secondary | ICD-10-CM

## 2023-12-31 DIAGNOSIS — R2681 Unsteadiness on feet: Secondary | ICD-10-CM

## 2023-12-31 DIAGNOSIS — R278 Other lack of coordination: Secondary | ICD-10-CM

## 2023-12-31 DIAGNOSIS — I639 Cerebral infarction, unspecified: Secondary | ICD-10-CM | POA: Diagnosis not present

## 2023-12-31 DIAGNOSIS — M6281 Muscle weakness (generalized): Secondary | ICD-10-CM | POA: Diagnosis not present

## 2023-12-31 DIAGNOSIS — R2689 Other abnormalities of gait and mobility: Secondary | ICD-10-CM

## 2023-12-31 DIAGNOSIS — R41840 Attention and concentration deficit: Secondary | ICD-10-CM | POA: Diagnosis not present

## 2023-12-31 NOTE — Therapy (Signed)
 OUTPATIENT OCCUPATIONAL THERAPY NEURO TREATMENT   Patient Name: Terry Hays MRN: [id number] DOB:Jul 08, 1950, 73 y.o., male Today's Date: 12/31/2023  PCP: [name], MD REFERRING PROVIDER: Bulah Alm RAMAN, PA-C  END OF SESSION:  OT End of Session - 12/31/23 1059     Visit Number 12    Number of Visits 17    Date for Recertification  01/05/24    Authorization Type [employer] PPO--17 visits approved 11/10/23-01/05/24    Authorization - Visit Number 12    Authorization - Number of Visits 17    Progress Note Due on Visit 20    OT Start Time 1100    OT Stop Time 1148    OT Time Calculation (min) 48 min    Activity Tolerance Patient tolerated treatment well    Behavior During Therapy [facility] for tasks assessed/performed         Past Medical History:  Diagnosis Date   Abnormal ECG    Allergy    Anxiety    FLYING   Diverticulosis    Dyslipidemia    History of prostate cancer    Hyperlipidemia    Hypertension    LVH (left ventricular hypertrophy)    Pre-diabetes    Past Surgical History:  Procedure Laterality Date   COLONOSCOPY  2004 2011   MAGOD   EYE SURGERY  03/10/1999   CATOACTS BOTH   HERNIA REPAIR  03/09/1952   R INGHINAL   INGUINAL HERNIA REPAIR Left 04/07/2018   Procedure: OPERN LEFT INGUINAL HERNIA REPAIR WITH MESH ERAS PATHWAY;  Surgeon: Tanda Locus, MD;  Location: [facility];  Service: General;  Laterality: Left;   LOOP RECORDER INSERTION N/A 09/17/2023   Procedure: LOOP RECORDER INSERTION;  Surgeon: Lesia Ozell Barter, PA-C;  Location: [facility] INVASIVE CV LAB;  Service: Cardiovascular;  Laterality: N/A;   PENILE PROSTHESIS IMPLANT  03/09/2009   DAHLSTEDT   PROSTATE SURGERY  03/09/2001   PROSTRATECTOMY   TRANSESOPHAGEAL ECHOCARDIOGRAM (CATH LAB) N/A 12/13/2023   Procedure: TRANSESOPHAGEAL ECHOCARDIOGRAM;  Surgeon: Floretta Mallard, MD;  Location: [facility] INVASIVE CV LAB;  Service: Cardiovascular;  Laterality: N/A;   Patient Active Problem List    Diagnosis Date Noted   Coronary artery disease involving native coronary artery of native heart without angina pectoris 12/03/2023   History of CVA (cerebrovascular accident) 11/06/2023   Cognitive impairment 09/12/2023   Right lower lobe pulmonary nodule 09/08/2023   Attention deficit hyperactivity disorder (ADHD), predominantly hyperactive type 06/03/2021   Cherry angioma 06/03/2021   Seborrheic keratosis 06/03/2021   Arthritis 03/17/2018   Spinal stenosis of lumbosacral region 03/17/2018   Actinic keratosis 03/17/2018   Degeneration of lumbar intervertebral disc 10/27/2017   History of prostate cancer 09/15/2011   Fear of flying 09/15/2011   Family history of heart disease in male family member before age 53 09/15/2011   Allergic rhinitis 09/15/2011   Hemorrhoids 09/15/2011   Hypertension 06/27/2010   Dyslipidemia    LVH (left ventricular hypertrophy)     ONSET DATE: late June 2025  REFERRING DIAG: Z86.73, Z92.89, H54.7  THERAPY DIAG:  Visuospatial deficit  Hemianopia, homonymous, left  Other lack of coordination  Muscle weakness (generalized)  Cerebrovascular accident (CVA), unspecified mechanism (HCC)  Rationale for Evaluation and Treatment: Rehabilitation  SUBJECTIVE:   SUBJECTIVE STATEMENT: He feels that his left eye lid droops and impairs his peripheral vision. He wonders if there is a way to make this better.   Denies pain and no falls  Pt accompanied by: self and  spouse for end of session  PERTINENT HISTORY: CVA 09/08/23, hx of falls  PRECAUTIONS: None  WEIGHT BEARING RESTRICTIONS: No  PAIN:  Are you having pain? No  FALLS: Has patient fallen in last 6 months? Yes. Number of falls unclear, but fell a couple weeks ago Not paying attention and has had stumbles but not falls.  LIVING ENVIRONMENT: Lives with: lives with their spouse Lives in: House/apartment Stairs: Yes: Internal: unclear but has a rail for internal steps steps; steps to enter  home with rail and rail doing downstairs Has following equipment at home: Single point cane  PLOF: Independent, Independent with basic ADLs, Independent with household mobility without device, and Independent with transfers  PATIENT GOALS: Pt would like to be able use his computer again for email, typing in word, etc. Would also love to be able to golf, play pickle ball etc. Ultimate goal is to drive.  OBJECTIVE:  Note: Objective measures were completed at Evaluation unless otherwise noted.  HAND DOMINANCE: Left  ADLs: Overall ADL: Mod I at this time, using a SPC for mobility.   IADLs: Overall pt is not driving and has assist for grocery shopping, bills, cooking, IADLs at this time.  Handwriting: 90% legible  MOBILITY STATUS: Hx of falls and needs SPC  POSTURE COMMENTS:  rounded shoulders Sitting balance: decreased core strength noted but overall demonstrates functional sitting balance.   ACTIVITY TOLERANCE: Activity tolerance: decreased but functional for ADL tasks, not able to do IADL tasks such as golf and pickleball  FUNCTIONAL OUTCOME MEASURES: NA - pt truly does not feel like he has LUE deficits from CVA and wants to work on vision  UPPER EXTREMITY ROM and MMT:     Active ROM Right eval Left eval  Shoulder flexion 150 WFL  (Blank rows = not tested)  MMT - Note mild deficits in B shoulders for shoulder flexion 2/2 old injuries from falls   HAND FUNCTION: WFL - note pt verbose and arrived late to evaluation, did not have time to asses grip strength as pt wanted to focus on vision. Will plan to assess in future sessions.   COORDINATION: Finger Nose Finger test: dysmetria and decreased coordination noted but unclear if this is from visual deficits to aim for other hand  11/25/23: 9 hole peg test: RT = 49.58 sec (slower d/t missing far Lt side and difficulty following directions even after multiple cues) Lt = 44.70 (difficulty following directions)    SENSATION: WFL  EDEMA: none  MUSCLE TONE: RUE: Within functional limits and LUE: Within functional limits  COGNITION: Overall cognitive status: Impaired and Difficulty to assess due to: Note pt does not think deficits are significant but statements from S.O. in room states that cognition has been limited for comprehension, short term memory, and higher level tasks.   VISION: Subjective report: Pt reports he cannot see anything on the left - like its not even there. States he has learned various scanning and compensatory techniques.  Baseline vision: no glassess but demonstrates hemianopsia  Visual history: none prior to CVA   VISION ASSESSMENT: Impaired To be further assessed in functional context Eye alignment: Impaired:   Tracking/Visual pursuits: Decreased smoothness with horizontal tracking, Decreased smoothness with vertical tracking, Decreased smoothness of eye movement to Left superior field, Decreased smoothness of eye movement to Left inferior field, and Requires cues, head turns, or add eye shifts to track Visual Fields: Left homonymous hemianopsia  Patient has difficulty with following activities due to following visual impairments: driving,  cooking, pickleball, golfing, has ad recent falls.   PERCEPTION: Impaired: Inattention/neglect: does not attend to left visual field  PRAXIS: Not tested  OBSERVATIONS: The pt is a 73 yo male who is accompanied by his S.O. this date walking with a SPC who demonstrates dignificant visual deficits post CVA. Pt demonstrates slight deficits in BUE shoulder ROM but is fairly symmetrical and good MMT - does not feel like strength has been impacted from CVA from a neuro stand point. Pt with significant visual field deficits, L hemianopsia, and decreased attention to L visual field impacting skill performance.                                                                                                                              TREATMENT:    - Self-care/home management completed for duration as noted below including:  OT educated pt on options to assess ptosis of the L eye. Explained this could further complicate his L field cut, but likely will not fully resolve it. OT provided pt with Transpore to mechanically lift the eyelid. Therapist applied tape with mirror placed in front of pt at table level.   - Therapeutic activities completed for duration as noted below including:  Pt completed 24 piece puzzle for visual scanning, locating of items, processing, bimanual coordination/trunk control, sequencing of unfamiliar motor movements or tasks, motor planning, and item/pattern recognition. Pt requiring max cues for placement of pieces and general puzzle orientation. 1 cue to look to the left.   PATIENT EDUCATION: Education details: visual scanning; ptosis Person educated: Patient and wife Education method: Explanation, Demonstration, and Verbal cues Education comprehension: verbalized understanding, returned demonstration, verbal cues required, and needs further education  HOME EXERCISE PROGRAM: 11/22/23:  Visual HEP and compensation strategies  11/25/23: coordination HEP  12/20/23: Golf Solitaire  GOALS: Goals reviewed with patient? Yes  SHORT TERM GOALS: Target date: 12/08/23  Pt will be Supervision with vision specific HEP to further integrate strategies and scanning into daily life for max recovery and benefit.  Baseline: Pt does incooperate techniques from hospital but would benefit from further training  Goal status: MET  2.  Pt will demonstrate improved visual scanning to locate 5 items in a room with no more than 2 verbal cues to locate items.  Baseline:  5 items, needing 3+ cues for scanning Goal status: MET in min distracting environment.  12/06/23  3.  Pt will demonstrate improved visual acuity and/or use of adaptations to use his personal computer for leisure activities such as email, typing in  Word, etc with no more than Supervision.  Baseline: Pt reports significant difficulty with this Goal status: PARTIALLY MET w/ occasional cues (12/09/23 - Needed mod cueing to send email, use word, etc) 12/23/2023 - using phone more than computer  4.  Pt will perform a visual scanning task/worksheet of choice with no more than 2 errors in order to demonstrate improved scanning and acuity.  Goal status: Ongoing  12/06/23 1.86M scanning sheet with 7 errors (93%)  12/22/2023: 1.86M double cancellation sheet (91% accuracy)  LONG TERM GOALS: Target date: 01/05/24  Pt will be MOD I with vision specific HEP to further integrate strategies and scanning into daily life for max recovery and benefit.  Baseline: Pt does incooperate techniques from hospital but would benefit from further training  Goal status: IN Progress  2.  Pt will demonstrate improved visual scanning to locate 5 items in a room with no verbal cues to locate items.  Baseline:  5 items, needing 3+ cues for scanning Goal status: INITIAL  3.  Pt will demonstrate improved visual acuity and/or use of adaptations to use his personal computer for leisure activities such as email, typing in Word, etc with Independence. Baseline: Pt reports significant difficulty with this Goal status: IN PROGRESS  4.  If safe and appropriate, pt will report a return to some leisure activities such as modified golf, pickle ball, walking, etc with use of strategies, AE, or compensatory techniques to improve safety and participation.  Baseline: not safe and not performing at this time 12/22/2023: pt able to walk to therapy appointment today Goal status: IN PROGRESS  ASSESSMENT:  CLINICAL IMPRESSION: Pt reporting benefit from lifted taping of L eyelid. He was motivated to participate in therapy though required max cues for proper completion. He will benefit from review of strategies with completion of functional tasks.   PERFORMANCE DEFICITS: in functional  skills including ADLs, IADLs, proprioception, endurance, and vision, cognitive skills including attention, memory, problem solving, safety awareness, and sequencing, and psychosocial skills including coping strategies, environmental adaptation, habits, and routines and behaviors.   IMPAIRMENTS: are limiting patient from ADLs, IADLs, leisure, and social participation.   CO-MORBIDITIES: has no other co-morbidities that affects occupational performance. Patient will benefit from skilled OT to address above impairments and improve overall function.  REHAB POTENTIAL: Good  PLAN:  OT FREQUENCY: 2x/week  OT DURATION: 8 weeks  PLANNED INTERVENTIONS: 97168 OT Re-evaluation, 97535 self care/ADL training, 02889 therapeutic exercise, 97530 therapeutic activity, and 97112 neuromuscular re-education  RECOMMENDED OTHER SERVICES: NA  CONSULTED AND AGREED WITH PLAN OF CARE: Patient and family member/caregiver  PLAN FOR NEXT SESSION:  [email or web address]  environmental scanning, consider playing Connect 4, begin checking goals - plan to renew next week for 2x/wk for 4 weeks Consider 2 color external cue to L side with scanning ie) top and bottom visual field  Jocelyn CHRISTELLA Bottom, OTR/L 12/31/2023, 12:20 PM

## 2023-12-31 NOTE — Therapy (Signed)
 OUTPATIENT PHYSICAL THERAPY NEURO TREATMENT NOTE   Patient Name: Terry Hays MRN: [id number] DOB:11/03/1950, 73 y.o., male Today's Date: 12/31/2023   PCP: [name], MD  REFERRING PROVIDER: Bulah Alm RAMAN, PA-C   END OF SESSION:  PT End of Session - 12/31/23 1020     Visit Number 6    Number of Visits 13    Date for Recertification  12/17/23    Authorization Type [employer] Medicare-submitted auth at eval    Authorization Time Period 4 PT visits 10/13-1/11/26    Authorization - Number of Visits 13    Progress Note Due on Visit 10    PT Start Time 1017    Equipment Utilized During Treatment Gait belt    Activity Tolerance Patient tolerated treatment well    Behavior During Therapy [facility] for tasks assessed/performed           Past Medical History:  Diagnosis Date   Abnormal ECG    Allergy    Anxiety    FLYING   Diverticulosis    Dyslipidemia    History of prostate cancer    Hyperlipidemia    Hypertension    LVH (left ventricular hypertrophy)    Pre-diabetes    Past Surgical History:  Procedure Laterality Date   COLONOSCOPY  2004 2011   MAGOD   EYE SURGERY  03/10/1999   CATOACTS BOTH   HERNIA REPAIR  03/09/1952   R INGHINAL   INGUINAL HERNIA REPAIR Left 04/07/2018   Procedure: OPERN LEFT INGUINAL HERNIA REPAIR WITH MESH ERAS PATHWAY;  Surgeon: Tanda Locus, MD;  Location: [facility];  Service: General;  Laterality: Left;   LOOP RECORDER INSERTION N/A 09/17/2023   Procedure: LOOP RECORDER INSERTION;  Surgeon: Lesia Ozell Barter, PA-C;  Location: [facility] INVASIVE CV LAB;  Service: Cardiovascular;  Laterality: N/A;   PENILE PROSTHESIS IMPLANT  03/09/2009   DAHLSTEDT   PROSTATE SURGERY  03/09/2001   PROSTRATECTOMY   TRANSESOPHAGEAL ECHOCARDIOGRAM (CATH LAB) N/A 12/13/2023   Procedure: TRANSESOPHAGEAL ECHOCARDIOGRAM;  Surgeon: Floretta Mallard, MD;  Location: [facility] INVASIVE CV LAB;  Service: Cardiovascular;  Laterality: N/A;   Patient Active Problem List    Diagnosis Date Noted   Coronary artery disease involving native coronary artery of native heart without angina pectoris 12/03/2023   History of CVA (cerebrovascular accident) 11/06/2023   Cognitive impairment 09/12/2023   Right lower lobe pulmonary nodule 09/08/2023   Attention deficit hyperactivity disorder (ADHD), predominantly hyperactive type 06/03/2021   Cherry angioma 06/03/2021   Seborrheic keratosis 06/03/2021   Arthritis 03/17/2018   Spinal stenosis of lumbosacral region 03/17/2018   Actinic keratosis 03/17/2018   Degeneration of lumbar intervertebral disc 10/27/2017   History of prostate cancer 09/15/2011   Fear of flying 09/15/2011   Family history of heart disease in male family member before age 75 09/15/2011   Allergic rhinitis 09/15/2011   Hemorrhoids 09/15/2011   Hypertension 06/27/2010   Dyslipidemia    LVH (left ventricular hypertrophy)     ONSET DATE: 10/20/2023 (MD referral)  REFERRING DIAG:  Z86.73 (ICD-10-CM) - History of stroke  H54.7 (ICD-10-CM) - Vision loss  Z92.89 (ICD-10-CM) - History of recent hospitalization    THERAPY DIAG:  Muscle weakness (generalized)  Other abnormalities of gait and mobility  Unsteadiness on feet  Rationale for Evaluation and Treatment: Rehabilitation  SUBJECTIVE:  SUBJECTIVE STATEMENT:  Feels like he can easily catch himself more with his balance. No falls, just a stumble or two. Was found to have a PFO and going to undergo a PFO closure mid November. Played 9 holes of golf and it went really well.   Pt accompanied by: self  PERTINENT HISTORY: PMH prior CVAs (unknown to patient), ADHD, HTN, HLD, prostate cancer, diverticulosis who presented to the ER with confusion and left visual field changes (09/2023)  PAIN:  Are you having pain?  No  PRECAUTIONS: Fall and Other: decreased peripheral vision from CVA   Avoid driving  RED FLAGS: None   WEIGHT BEARING RESTRICTIONS: No  FALLS: Has patient fallen in last 6 months? Yes. Number of falls 1-R knee gave way  LIVING ENVIRONMENT: Lives with: lives with their spouse Lives in: House/apartment Stairs: steps to enter with rails; upstairs and downstairs with rail Has following equipment at home: Single point cane  PLOF: Independent and Leisure: played pickleball and golf; was working out for core strengthening and walking several miles everyday  PATIENT GOALS: To get back to playing pickleball [facility]) and golf  OBJECTIVE:  Note: Objective measures were completed at Evaluation unless otherwise noted.  DIAGNOSTIC FINDINGS: acute CVA of the right posterior cerebral artery,   COGNITION: Overall cognitive status: Within functional limits for tasks assessed   SENSATION: Light touch: WFL  COORDINATION: WFL for bilat alt taps   MUSCLE TONE: WFL  POSTURE: rounded shoulders  LOWER EXTREMITY ROM:   WFL BLEs   LOWER EXTREMITY MMT:    MMT Right Eval Left Eval  Hip flexion 4 4+  Hip extension    Hip abduction 5 5  Hip adduction 5 5  Hip internal rotation    Hip external rotation    Knee flexion 5 5  Knee extension 4 4  Ankle dorsiflexion 4 4  Ankle plantarflexion    Ankle inversion    Ankle eversion    (Blank rows = not tested)  TRANSFERS: Sit to stand: Modified independence  Assistive device utilized: None     Stand to sit: Modified independence  Assistive device utilized: None      GAIT: Findings: Gait Characteristics: veers to R, step through pattern, and wide BOS, Distance walked: 50 ft, Assistive device utilized:Single point cane, and Level of assistance: SBA  FUNCTIONAL TESTS:  5 times sit to stand: 13.91 sec Timed up and go (TUG): NT 10 meter walk test: 13.41 veers to R= 2.44 ft/sec FGA= 16/30  M-CTSIB  Condition 1: Firm Surface, EO  30 Sec, Normal Sway  Condition 2: Firm Surface, EC 30 Sec, Normal Sway  Condition 3: Foam Surface, EO 30 Sec, Mild Sway  Condition 4: Foam Surface, EC 30 Sec, Mild and Moderate Sway  TREATMENT DATE: 12/31/23  Therapeutic Activity:  Vitals:   12/31/23 1027  BP: 117/65  Pulse: 60   On air ex: 1/2 tandem stance and playing Spot It with turning head R/L to card, performed 2 sets of 2 cards on each side, pt taking incr time to find matching picture, reporting incr visual and brain fatigue with this, but enjoyed this exercise  Alternating forward stepping and tossing 4# medicine ball to floor and catching it with trunk rotation/head turns, performed 10 reps each side, pt needing intermittent reminder cues to step off with L foot  On rockerboard in A/P direction: Alternating SLS taps to 2 cones, performed 12 reps each side, pt reporting RPE as 8/10, pt challenged with SLS on LLE, pt sometimes knocking cone over, cues for slowed and controlled  Tossing ball with PT student in multi-directions for visual scanning/head motions with pt more challenged with catching ball to the L side, then naming foods in alphabetical order for cognitive challenge, halfway through alphabet turned board in M/L direction with pt more challenged keeping his balance this way   PATIENT EDUCATION: Education details: Continue HEP, discussed can purchase Spot It to practice at home, educated on what each balance task is addressing  Person educated: Patient Education method: Explanation, Demonstration, and Verbal cues Education comprehension: verbalized understanding, returned demonstration, and needs further education  HOME EXERCISE PROGRAM: Access Code: [email or web address] URL: [email or web address] Date: 12/06/2023 Prepared by: Sheffield Senate  Exercises - Single Leg Stance with Support   - 1 x daily - 7 x weekly - 1 sets - 2-3 reps - 10 secs hold - Tandem Stance with Chair Support  - 1 x daily - 7 x weekly - 1 sets - 2 reps - 30 secs  hold - Standing Single Leg Heel Raise  - 1 x daily - 7 x weekly - 1 sets - 10 reps - 2 secs hold - Walking with Head Rotation  - 1 x daily - 7 x weekly - 1 sets - 10 reps - Romberg Stance Eyes Closed on Foam Pad  - 1 x daily - 5 x weekly - 3 sets - 30 hold - Romberg Stance on Foam Pad with Head Rotation  - 1 x daily - 5 x weekly - 2 sets - 10 reps  GOALS: Goals reviewed with patient? Yes  SHORT TERM GOALS: Target date: 11/19/2023  Pt will be independent with HEP for improved balance and gait. Baseline: Goal status: Not met - 1st PT appt 11-22-23  2.  Pt will improve 5x sit<>stand to less than or equal to 12 sec to demonstrate improved functional strength and transfer efficiency. Baseline: 13.41 sec; 11-22-23 - 10.22 secs without UE support from chair Goal status: Goal met 11-22-23   LONG TERM GOALS: Target date: 12/17/2023  Pt will be independent with HEP for improved balance, gait. Baseline:  Goal status: Goal met 12-20-23  2.  Pt will improve FGA score to at least 23/30 to decrease fall risk. Baseline: 16/30;  23/30 (12-20-23) Goal status: Goal met   3.  Pt will improve gait velocity to at least 2.62 ft/sec for improved gait efficiency and safety. Baseline: 2.44 ft/sec;  12-20-23:  13.63, 11.75 = 2.79 ft/sec  Goal status: Goal met 12-20-23  4.  Pt will verbalize understanding of fall prevention in home environment. Baseline:  Goal status: Goal met 12-20-23  5.  Pt will verbalize plans for continued community fitness upon d/c from PT, to maximize gains made in PT. Baseline:  Goal status: Ongoing 12-20-23   NEW LONG TERM GOALS:  Target Date:  01-21-24    Pt will improve FGA score to at least 25/30 to decrease fall risk.    Baseline: 16/30;  23/30 (12-20-23)    Goal status:  Updated   2.  Pt will improve gait velocity to at  least 3.0 ft/sec for improved gait efficiency and safety. Baseline: 2.44 ft/sec;  12-20-23:  13.63, 11.75 = 2.79 ft/sec  Goal status: Updated   ASSESSMENT:  CLINICAL IMPRESSION: Today's skilled session focused on high level balance tasks on compliant surfaces with narrow BOS and incorporation of head movements and visual scanning. Pt most challenged by playing Spot It activity due to head motions and cognitive task. Pt interested in doing this activity again in the future. Continue per POC.   OBJECTIVE IMPAIRMENTS: Abnormal gait, decreased balance, decreased mobility, difficulty walking, and decreased strength.   ACTIVITY LIMITATIONS: transfers and locomotion level  PARTICIPATION LIMITATIONS: driving, community activity, and fitness/leisure activities  PERSONAL FACTORS: 3+ comorbidities: PMH above are also affecting patient's functional outcome.   REHAB POTENTIAL: Good  CLINICAL DECISION MAKING: Stable/uncomplicated  EVALUATION COMPLEXITY: Low  PLAN:  PT FREQUENCY: 1x/week   PT DURATION: 4 weeks (per recert)  PLANNED INTERVENTIONS: 97750- Physical Performance Testing, 97110-Therapeutic exercises, 97530- Therapeutic activity, [id number]- Neuromuscular re-education, 97535- Self Care, [id number]- Gait training, Patient/Family education, and Balance training  PLAN FOR NEXT SESSION: continue balance with visual scanning activity; pt requests to progress above rockerboard activity to retrieving object on Lt side and then turning around (180 degrees) to place object on his Rt side   dynamic gait and balance - activities to incorporate visual scanning, esp. On Lt side (Renewal completed 12-20-23)  Sheffield LOISE Senate, PT, DPT 12/31/2023, 10:21 AM   Referring diagnosis?  Z86.73 (ICD-10-CM) - History of stroke  H54.7 (ICD-10-CM) - Vision loss  Z92.89 (ICD-10-CM) - History of recent hospitalization   Treatment diagnosis? (if different than referring diagnosis) R 26.81, R 26.89, M62.81 What  was this (referring dx) caused by? []  Surgery []  Fall []  Ongoing issue []  Arthritis [x]  Other: ____________  Laterality: []  Rt []  Lt [x]  Both  Check all possible CPT codes:  *CHOOSE 10 OR LESS*    See Planned Interventions listed in the Plan section of the Evaluation.

## 2024-01-03 ENCOUNTER — Ambulatory Visit: Admitting: Occupational Therapy

## 2024-01-03 DIAGNOSIS — I639 Cerebral infarction, unspecified: Secondary | ICD-10-CM | POA: Diagnosis not present

## 2024-01-03 DIAGNOSIS — R41844 Frontal lobe and executive function deficit: Secondary | ICD-10-CM

## 2024-01-03 DIAGNOSIS — H53462 Homonymous bilateral field defects, left side: Secondary | ICD-10-CM | POA: Diagnosis not present

## 2024-01-03 DIAGNOSIS — R2689 Other abnormalities of gait and mobility: Secondary | ICD-10-CM | POA: Diagnosis not present

## 2024-01-03 DIAGNOSIS — R41840 Attention and concentration deficit: Secondary | ICD-10-CM

## 2024-01-03 DIAGNOSIS — R278 Other lack of coordination: Secondary | ICD-10-CM | POA: Diagnosis not present

## 2024-01-03 DIAGNOSIS — R2681 Unsteadiness on feet: Secondary | ICD-10-CM

## 2024-01-03 DIAGNOSIS — M6281 Muscle weakness (generalized): Secondary | ICD-10-CM | POA: Diagnosis not present

## 2024-01-03 DIAGNOSIS — R41842 Visuospatial deficit: Secondary | ICD-10-CM

## 2024-01-03 NOTE — Therapy (Signed)
 OUTPATIENT OCCUPATIONAL THERAPY NEURO TREATMENT   Patient Name: Terry Hays MRN: [id number] DOB:02-12-1951, 73 y.o., male Today's Date: 01/03/2024  PCP: [name], MD REFERRING PROVIDER: Bulah Alm RAMAN, PA-C  END OF SESSION:  OT End of Session - 01/03/24 1109     Visit Number 13    Number of Visits 22    Date for Recertification  02/05/24    Authorization Type [employer] PPO--17 visits approved 11/10/23-01/05/24    Authorization - Visit Number 13    Authorization - Number of Visits 17    Progress Note Due on Visit 20    OT Start Time 1105    OT Stop Time 1145    OT Time Calculation (min) 40 min    Activity Tolerance Patient tolerated treatment well    Behavior During Therapy [facility] for tasks assessed/performed         Past Medical History:  Diagnosis Date   Abnormal ECG    Allergy    Anxiety    FLYING   Diverticulosis    Dyslipidemia    History of prostate cancer    Hyperlipidemia    Hypertension    LVH (left ventricular hypertrophy)    Pre-diabetes    Past Surgical History:  Procedure Laterality Date   COLONOSCOPY  2004 2011   MAGOD   EYE SURGERY  03/10/1999   CATOACTS BOTH   HERNIA REPAIR  03/09/1952   R INGHINAL   INGUINAL HERNIA REPAIR Left 04/07/2018   Procedure: OPERN LEFT INGUINAL HERNIA REPAIR WITH MESH ERAS PATHWAY;  Surgeon: Tanda Locus, MD;  Location: [facility];  Service: General;  Laterality: Left;   LOOP RECORDER INSERTION N/A 09/17/2023   Procedure: LOOP RECORDER INSERTION;  Surgeon: Lesia Ozell Barter, PA-C;  Location: [facility] INVASIVE CV LAB;  Service: Cardiovascular;  Laterality: N/A;   PENILE PROSTHESIS IMPLANT  03/09/2009   DAHLSTEDT   PROSTATE SURGERY  03/09/2001   PROSTRATECTOMY   TRANSESOPHAGEAL ECHOCARDIOGRAM (CATH LAB) N/A 12/13/2023   Procedure: TRANSESOPHAGEAL ECHOCARDIOGRAM;  Surgeon: Floretta Mallard, MD;  Location: [facility] INVASIVE CV LAB;  Service: Cardiovascular;  Laterality: N/A;   Patient Active Problem List    Diagnosis Date Noted   Coronary artery disease involving native coronary artery of native heart without angina pectoris 12/03/2023   History of CVA (cerebrovascular accident) 11/06/2023   Cognitive impairment 09/12/2023   Right lower lobe pulmonary nodule 09/08/2023   Attention deficit hyperactivity disorder (ADHD), predominantly hyperactive type 06/03/2021   Cherry angioma 06/03/2021   Seborrheic keratosis 06/03/2021   Arthritis 03/17/2018   Spinal stenosis of lumbosacral region 03/17/2018   Actinic keratosis 03/17/2018   Degeneration of lumbar intervertebral disc 10/27/2017   History of prostate cancer 09/15/2011   Fear of flying 09/15/2011   Family history of heart disease in male family member before age 61 09/15/2011   Allergic rhinitis 09/15/2011   Hemorrhoids 09/15/2011   Hypertension 06/27/2010   Dyslipidemia    LVH (left ventricular hypertrophy)     ONSET DATE: late June 2025  REFERRING DIAG: Z86.73, Z92.89, H54.7  THERAPY DIAG:  Visuospatial deficit - Plan: Ot plan of care cert/re-cert  Other lack of coordination - Plan: Ot plan of care cert/re-cert  Hemianopia, homonymous, left - Plan: Ot plan of care cert/re-cert  Unsteadiness on feet - Plan: Ot plan of care cert/re-cert  Muscle weakness (generalized) - Plan: Ot plan of care cert/re-cert  Attention and concentration deficit - Plan: Ot plan of care cert/re-cert  Frontal lobe and executive function  deficit - Plan: Ot plan of care cert/re-cert  Rationale for Evaluation and Treatment: Rehabilitation  SUBJECTIVE:   SUBJECTIVE STATEMENT: Taping my eyelid helped me see a little better.  No pain today but sore yesterday from Friday's P.T. - she worked me out  Denies pain and no falls  Pt accompanied by: self and spouse for end of session  PERTINENT HISTORY: CVA 09/08/23, hx of falls  PRECAUTIONS: None  WEIGHT BEARING RESTRICTIONS: No  PAIN:  Are you having pain? No  FALLS: Has patient fallen in  last 6 months? Yes. Number of falls unclear, but fell a couple weeks ago Not paying attention and has had stumbles but not falls.  LIVING ENVIRONMENT: Lives with: lives with their spouse Lives in: House/apartment Stairs: Yes: Internal: unclear but has a rail for internal steps steps; steps to enter home with rail and rail doing downstairs Has following equipment at home: Single point cane  PLOF: Independent, Independent with basic ADLs, Independent with household mobility without device, and Independent with transfers  PATIENT GOALS: Pt would like to be able use his computer again for email, typing in word, etc. Would also love to be able to golf, play pickle ball etc. Ultimate goal is to drive.  OBJECTIVE:  Note: Objective measures were completed at Evaluation unless otherwise noted.  HAND DOMINANCE: Left  ADLs: Overall ADL: Mod I at this time, using a SPC for mobility.   IADLs: Overall pt is not driving and has assist for grocery shopping, bills, cooking, IADLs at this time.  Handwriting: 90% legible  MOBILITY STATUS: Hx of falls and needs SPC  POSTURE COMMENTS:  rounded shoulders Sitting balance: decreased core strength noted but overall demonstrates functional sitting balance.   ACTIVITY TOLERANCE: Activity tolerance: decreased but functional for ADL tasks, not able to do IADL tasks such as golf and pickleball  FUNCTIONAL OUTCOME MEASURES: NA - pt truly does not feel like he has LUE deficits from CVA and wants to work on vision  UPPER EXTREMITY ROM and MMT:     Active ROM Right eval Left eval  Shoulder flexion 150 WFL  (Blank rows = not tested)  MMT - Note mild deficits in B shoulders for shoulder flexion 2/2 old injuries from falls   HAND FUNCTION: WFL - note pt verbose and arrived late to evaluation, did not have time to asses grip strength as pt wanted to focus on vision. Will plan to assess in future sessions.   COORDINATION: Finger Nose Finger test:  dysmetria and decreased coordination noted but unclear if this is from visual deficits to aim for other hand  11/25/23: 9 hole peg test: RT = 49.58 sec (slower d/t missing far Lt side and difficulty following directions even after multiple cues) Lt = 44.70 (difficulty following directions)   SENSATION: WFL  EDEMA: none  MUSCLE TONE: RUE: Within functional limits and LUE: Within functional limits  COGNITION: Overall cognitive status: Impaired and Difficulty to assess due to: Note pt does not think deficits are significant but statements from S.O. in room states that cognition has been limited for comprehension, short term memory, and higher level tasks.   VISION: Subjective report: Pt reports he cannot see anything on the left - like its not even there. States he has learned various scanning and compensatory techniques.  Baseline vision: no glassess but demonstrates hemianopsia  Visual history: none prior to CVA   VISION ASSESSMENT: Impaired To be further assessed in functional context Eye alignment: Impaired:   Tracking/Visual pursuits:  Decreased smoothness with horizontal tracking, Decreased smoothness with vertical tracking, Decreased smoothness of eye movement to Left superior field, Decreased smoothness of eye movement to Left inferior field, and Requires cues, head turns, or add eye shifts to track Visual Fields: Left homonymous hemianopsia  Patient has difficulty with following activities due to following visual impairments: driving, cooking, pickleball, golfing, has ad recent falls.   PERCEPTION: Impaired: Inattention/neglect: does not attend to left visual field  PRAXIS: Not tested  OBSERVATIONS: The pt is a 73 yo male who is accompanied by his S.O. this date walking with a SPC who demonstrates dignificant visual deficits post CVA. Pt demonstrates slight deficits in BUE shoulder ROM but is fairly symmetrical and good MMT - does not feel like strength has been impacted from CVA  from a neuro stand point. Pt with significant visual field deficits, L hemianopsia, and decreased attention to L visual field impacting skill performance.                                                                                                                             TREATMENT:    Pt found 5 items in room however missed first item initially when setting down cane right beside item. Pt also tripped over stool because he didn't see it fully.   Pt performed environmental scanning down quiet hallway finding 12/15 items (80% accuracy) on first pass w/ 1 questioning cue to find one item with busy background, and extra time required.  Pt missed 3 items w/ 2 being on Lt side. Pt found 2/3 remaining items on 2nd pass.   Pt performing simple word search (8M print size) all horizontal words Lt to Rt. Pt issued another word search to practice at home (84M print size)   Pt/wife given further ideas to work on at home including:  simulated pickle ball standing against wall or seated (for balance) and bouncing wiffle ball off wall. If this is too fast, then pt can try hitting balloon; also recommended Connect 4 for visual scanning, planning ahead and cognitive challenge  Renewal completed today for additional month   PATIENT EDUCATION: Education details: visual scanning; ptosis Person educated: Patient and wife Education method: Explanation, Demonstration, and Verbal cues Education comprehension: verbalized understanding, returned demonstration, verbal cues required, and needs further education  HOME EXERCISE PROGRAM: 11/22/23:  Visual HEP and compensation strategies  11/25/23: coordination HEP  12/20/23: Golf Solitaire  GOALS: Goals reviewed with patient? Yes  SHORT TERM GOALS: Target date: 12/08/23  Pt will be Supervision with vision specific HEP to further integrate strategies and scanning into daily life for max recovery and benefit.  Baseline: Pt does incooperate techniques from  hospital but would benefit from further training  Goal status: MET  2.  Pt will demonstrate improved visual scanning to locate 5 items in a room with no more than 2 verbal cues to locate items.  Baseline:  5 items, needing 3+ cues for scanning Goal status:  MET in min distracting environment.  12/06/23  3.  Pt will demonstrate improved visual acuity and/or use of adaptations to use his personal computer for leisure activities such as email, typing in Word, etc with no more than Supervision.  Baseline: Pt reports significant difficulty with this Goal status: PARTIALLY MET w/ occasional cues (12/09/23 - Needed mod cueing to send email, use word, etc) 12/23/2023 - using phone more than computer  4.  Pt will perform a visual scanning task/worksheet of choice with no more than 2 errors in order to demonstrate improved scanning and acuity.  Goal status: Ongoing  12/06/23 1.36M scanning sheet with 7 errors (93%)  12/22/2023: 1.36M double cancellation sheet (91% accuracy)  LONG TERM GOALS: Target date: 01/05/24  Pt will be MOD I with vision specific HEP to further integrate strategies and scanning into daily life for max recovery and benefit.  Baseline: Pt does incooperate techniques from hospital but would benefit from further training  Goal status: IN Progress  2.  Pt will demonstrate improved visual scanning to locate 5 items in a room with no verbal cues to locate items.  Baseline:  5 items, needing 3+ cues for scanning Goal status: MET  3.  Pt will demonstrate improved visual acuity and/or use of adaptations to use his personal computer for leisure activities such as email, typing in Word, etc with Independence. Baseline: Pt reports significant difficulty with this Goal status: IN PROGRESS (have practiced in clinic but continues to need some assist)   4.  If safe and appropriate, pt will report a return to some leisure activities such as modified golf, pickle ball, walking, etc with use of  strategies, AE, or compensatory techniques to improve safety and participation.  Baseline: not safe and not performing at this time 12/22/2023: pt able to walk to therapy appointment today Goal status: IN PROGRESS (pt has returned to all but pickle ball d/t vision and balance)  5.  Pt will perform environmental scanning in moderately distracting gym consistently at 80% accuracy  Baseline: 01/03/24 = 80% accuracy down quiet hallway only with extra time Goal status: INITIAL   ASSESSMENT:  CLINICAL IMPRESSION: Pt has met 3/4 STG's and 1 LTG. Pt currently progressing towards remaining LTG's and added new goal (LTG #5) for renewal period. Pt would continue to benefit from skilled O.T. to address these remaining goals, improve safety, and maximize function.   PERFORMANCE DEFICITS: in functional skills including ADLs, IADLs, proprioception, endurance, and vision, cognitive skills including attention, memory, problem solving, safety awareness, and sequencing, and psychosocial skills including coping strategies, environmental adaptation, habits, and routines and behaviors.   IMPAIRMENTS: are limiting patient from ADLs, IADLs, leisure, and social participation.   CO-MORBIDITIES: has no other co-morbidities that affects occupational performance. Patient will benefit from skilled OT to address above impairments and improve overall function.  REHAB POTENTIAL: Good  PLAN:  OT FREQUENCY: 2x/week  OT DURATION: 4 additional weeks (through end of November)  PLANNED INTERVENTIONS: 02831 OT Re-evaluation, 97535 self care/ADL training, 02889 therapeutic exercise, 97530 therapeutic activity, and 97112 neuromuscular re-education  RECOMMENDED OTHER SERVICES: NA  CONSULTED AND AGREED WITH PLAN OF CARE: Patient and family member/caregiver  PLAN FOR NEXT SESSION:  CHECK FOR [employer] AUTH FROM RENEWAL (Form submitted), [email or web address] consider playing Connect 4,  Consider 2 color external cue to L side with  scanning ie) top and bottom visual field  Burnard JINNY Roads, OTR/L 01/03/2024, 12:23 PM

## 2024-01-05 ENCOUNTER — Encounter: Payer: Self-pay | Admitting: Physical Therapy

## 2024-01-05 ENCOUNTER — Encounter: Payer: Self-pay | Admitting: Occupational Therapy

## 2024-01-05 ENCOUNTER — Ambulatory Visit: Admitting: Physical Therapy

## 2024-01-05 ENCOUNTER — Ambulatory Visit: Admitting: Occupational Therapy

## 2024-01-05 DIAGNOSIS — R41840 Attention and concentration deficit: Secondary | ICD-10-CM | POA: Diagnosis not present

## 2024-01-05 DIAGNOSIS — R2689 Other abnormalities of gait and mobility: Secondary | ICD-10-CM

## 2024-01-05 DIAGNOSIS — I639 Cerebral infarction, unspecified: Secondary | ICD-10-CM | POA: Diagnosis not present

## 2024-01-05 DIAGNOSIS — R41842 Visuospatial deficit: Secondary | ICD-10-CM

## 2024-01-05 DIAGNOSIS — R41844 Frontal lobe and executive function deficit: Secondary | ICD-10-CM | POA: Diagnosis not present

## 2024-01-05 DIAGNOSIS — R2681 Unsteadiness on feet: Secondary | ICD-10-CM | POA: Diagnosis not present

## 2024-01-05 DIAGNOSIS — M6281 Muscle weakness (generalized): Secondary | ICD-10-CM

## 2024-01-05 DIAGNOSIS — H53462 Homonymous bilateral field defects, left side: Secondary | ICD-10-CM | POA: Diagnosis not present

## 2024-01-05 DIAGNOSIS — R278 Other lack of coordination: Secondary | ICD-10-CM | POA: Diagnosis not present

## 2024-01-05 NOTE — Therapy (Signed)
 OUTPATIENT PHYSICAL THERAPY NEURO TREATMENT NOTE   Patient Name: Terry Hays MRN: [id number] DOB:[birth year], 73 y.o., male Today's Date: 01/05/2024   PCP: [name], MD  REFERRING PROVIDER: Bulah Alm RAMAN, PA-C   END OF SESSION:  PT End of Session - 01/05/24 1019     Visit Number 7    Number of Visits 13    Date for Recertification  02/06/24   from re-cert   Authorization Type [employer]-submitted auth at eval    Authorization Time Period 4 PT visits 10/13-1/11/26    Authorization - Number of Visits 13    Progress Note Due on Visit 10    PT Start Time 1018    PT Stop Time 1058    PT Time Calculation (min) 40 min    Equipment Utilized During Treatment Gait belt    Activity Tolerance Patient tolerated treatment well    Behavior During Therapy WFL for tasks assessed/performed           Past Medical History:  Diagnosis Date   Abnormal ECG    Allergy    Anxiety    FLYING   Diverticulosis    Dyslipidemia    History of prostate cancer    Hyperlipidemia    Hypertension    LVH (left ventricular hypertrophy)    Pre-diabetes    Past Surgical History:  Procedure Laterality Date   COLONOSCOPY  2004 2011   MAGOD   EYE SURGERY  03/10/1999   CATOACTS BOTH   HERNIA REPAIR  03/09/1952   R INGHINAL   INGUINAL HERNIA REPAIR Left 04/07/2018   Procedure: OPERN LEFT INGUINAL HERNIA REPAIR WITH MESH ERAS PATHWAY;  Surgeon: Tanda Locus, MD;  Location: [facility];  Service: General;  Laterality: Left;   LOOP RECORDER INSERTION N/A 09/17/2023   Procedure: LOOP RECORDER INSERTION;  Surgeon: Lesia Ozell Barter, PA-C;  Location: [facility] INVASIVE CV LAB;  Service: Cardiovascular;  Laterality: N/A;   PENILE PROSTHESIS IMPLANT  03/09/2009   DAHLSTEDT   PROSTATE SURGERY  03/09/2001   PROSTRATECTOMY   TRANSESOPHAGEAL ECHOCARDIOGRAM (CATH LAB) N/A 12/13/2023   Procedure: TRANSESOPHAGEAL ECHOCARDIOGRAM;  Surgeon: Floretta Mallard, MD;  Location: [facility] INVASIVE CV LAB;   Service: Cardiovascular;  Laterality: N/A;   Patient Active Problem List   Diagnosis Date Noted   Coronary artery disease involving native coronary artery of native heart without angina pectoris 12/03/2023   History of CVA (cerebrovascular accident) 11/06/2023   Cognitive impairment 09/12/2023   Right lower lobe pulmonary nodule 09/08/2023   Attention deficit hyperactivity disorder (ADHD), predominantly hyperactive type 06/03/2021   Cherry angioma 06/03/2021   Seborrheic keratosis 06/03/2021   Arthritis 03/17/2018   Spinal stenosis of lumbosacral region 03/17/2018   Actinic keratosis 03/17/2018   Degeneration of lumbar intervertebral disc 10/27/2017   History of prostate cancer 09/15/2011   Fear of flying 09/15/2011   Family history of heart disease in male family member before age 48 09/15/2011   Allergic rhinitis 09/15/2011   Hemorrhoids 09/15/2011   Hypertension 06/27/2010   Dyslipidemia    LVH (left ventricular hypertrophy)     ONSET DATE: 10/20/2023 (MD referral)  REFERRING DIAG:  Z86.73 (ICD-10-CM) - History of stroke  H54.7 (ICD-10-CM) - Vision loss  Z92.89 (ICD-10-CM) - History of recent hospitalization    THERAPY DIAG:  Unsteadiness on feet  Muscle weakness (generalized)  Other abnormalities of gait and mobility  Rationale for Evaluation and Treatment: Rehabilitation  SUBJECTIVE:  SUBJECTIVE STATEMENT:  Reports everything was bothering him after last PT session - his legs, his hips, his arms. Was hurting too much to do any exercise all weekend. Still wants to work hard with PT today.   Pt accompanied by: self  PERTINENT HISTORY: PMH prior CVAs (unknown to patient), ADHD, HTN, HLD, prostate cancer, diverticulosis who presented to the ER with confusion and left visual field  changes (09/2023)  PAIN:  Are you having pain? No  PRECAUTIONS: Fall and Other: decreased peripheral vision from CVA   Avoid driving  RED FLAGS: None   WEIGHT BEARING RESTRICTIONS: No  FALLS: Has patient fallen in last 6 months? Yes. Number of falls 1-R knee gave way  LIVING ENVIRONMENT: Lives with: lives with their spouse Lives in: House/apartment Stairs: steps to enter with rails; upstairs and downstairs with rail Has following equipment at home: Single point cane  PLOF: Independent and Leisure: played pickleball and golf; was working out for core strengthening and walking several miles everyday  PATIENT GOALS: To get back to playing pickleball [facility]) and golf  OBJECTIVE:  Note: Objective measures were completed at Evaluation unless otherwise noted.  DIAGNOSTIC FINDINGS: acute CVA of the right posterior cerebral artery,   COGNITION: Overall cognitive status: Within functional limits for tasks assessed   SENSATION: Light touch: WFL  COORDINATION: WFL for bilat alt taps   MUSCLE TONE: WFL  POSTURE: rounded shoulders  LOWER EXTREMITY ROM:   WFL BLEs   LOWER EXTREMITY MMT:    MMT Right Eval Left Eval  Hip flexion 4 4+  Hip extension    Hip abduction 5 5  Hip adduction 5 5  Hip internal rotation    Hip external rotation    Knee flexion 5 5  Knee extension 4 4  Ankle dorsiflexion 4 4  Ankle plantarflexion    Ankle inversion    Ankle eversion    (Blank rows = not tested)  TRANSFERS: Sit to stand: Modified independence  Assistive device utilized: None     Stand to sit: Modified independence  Assistive device utilized: None      GAIT: Findings: Gait Characteristics: veers to R, step through pattern, and wide BOS, Distance walked: 50 ft, Assistive device utilized:Single point cane, and Level of assistance: SBA  FUNCTIONAL TESTS:  5 times sit to stand: 13.91 sec Timed up and go (TUG): NT 10 meter walk test: 13.41 veers to R= 2.44 ft/sec FGA=  16/30  M-CTSIB  Condition 1: Firm Surface, EO 30 Sec, Normal Sway  Condition 2: Firm Surface, EC 30 Sec, Normal Sway  Condition 3: Foam Surface, EO 30 Sec, Mild Sway  Condition 4: Foam Surface, EC 30 Sec, Mild and Moderate Sway                                                                                                                                TREATMENT DATE: 01/05/24  NMR:  On blue foam  beam: Side step and feet together (or more narrow BOS), down and back x2 reps, progressed to pt holding 4# medicine ball and throwing to floor and catching it for an additional down and back x2 reps, intermittent CGA for balance, and when pt lost his balance, he was able to recover by taking a step posteriorly  Alternating forward stepping off beam and head turns to R/L, 15 reps each side, pt needing CGA and needing intermittent grabs to bar for balance   With 6 blaze pods (3 on first step, 2 on 2nd step) to work on weight shifting, SLS, foot clearance, reaction times, visual scanning. Performed on air ex for compliant surface, trying to perform with no UE support, alternating between tapping with RLE an dLLE  Performed 2 bouts of 1 minute each: 13 hits, 25 hits Pt needing one episode of min A for balance due to almost losing balance anteriorly   On rockerboard in A/P direction EO 10 reps head turns, performed an additional 10 reps with pt having to name card on R/L side in different directions, cued to just turn head instead of upper body  EO 10 reps head nods, pt with tendency to move his trunk as well   PATIENT EDUCATION: Education details: Continue HEP and importance of performing at home  Person educated: Patient Education method: Explanation, Demonstration, and Verbal cues Education comprehension: verbalized understanding, returned demonstration, and needs further education  HOME EXERCISE PROGRAM: Access Code: [email or web address] URL: [email or web address] Date:  12/06/2023 Prepared by: Sheffield Senate  Exercises - Single Leg Stance with Support  - 1 x daily - 7 x weekly - 1 sets - 2-3 reps - 10 secs hold - Tandem Stance with Chair Support  - 1 x daily - 7 x weekly - 1 sets - 2 reps - 30 secs  hold - Standing Single Leg Heel Raise  - 1 x daily - 7 x weekly - 1 sets - 10 reps - 2 secs hold - Walking with Head Rotation  - 1 x daily - 7 x weekly - 1 sets - 10 reps - Romberg Stance Eyes Closed on Foam Pad  - 1 x daily - 5 x weekly - 3 sets - 30 hold - Romberg Stance on Foam Pad with Head Rotation  - 1 x daily - 5 x weekly - 2 sets - 10 reps  GOALS: Goals reviewed with patient? Yes  SHORT TERM GOALS: Target date: 11/19/2023  Pt will be independent with HEP for improved balance and gait. Baseline: Goal status: Not met - 1st PT appt 11-22-23  2.  Pt will improve 5x sit<>stand to less than or equal to 12 sec to demonstrate improved functional strength and transfer efficiency. Baseline: 13.41 sec; 11-22-23 - 10.22 secs without UE support from chair Goal status: Goal met 11-22-23   LONG TERM GOALS: Target date: 12/17/2023  Pt will be independent with HEP for improved balance, gait. Baseline:  Goal status: Goal met 12-20-23  2.  Pt will improve FGA score to at least 23/30 to decrease fall risk. Baseline: 16/30;  23/30 (12-20-23) Goal status: Goal met   3.  Pt will improve gait velocity to at least 2.62 ft/sec for improved gait efficiency and safety. Baseline: 2.44 ft/sec;  12-20-23:  13.63, 11.75 = 2.79 ft/sec  Goal status: Goal met 12-20-23  4.  Pt will verbalize understanding of fall prevention in home environment. Baseline:  Goal status: Goal met 12-20-23  5.  Pt will verbalize plans for continued  community fitness upon d/c from PT, to maximize gains made in PT. Baseline:  Goal status: Ongoing 12-20-23   NEW LONG TERM GOALS:  Target Date:  01-21-24    Pt will improve FGA score to at least 25/30 to decrease fall risk.    Baseline: 16/30;   23/30 (12-20-23)    Goal status:  Updated   2.  Pt will improve gait velocity to at least 3.0 ft/sec for improved gait efficiency and safety. Baseline: 2.44 ft/sec;  12-20-23:  13.63, 11.75 = 2.79 ft/sec  Goal status: Updated   ASSESSMENT:  CLINICAL IMPRESSION: Today's skilled session continue to focus on high level balance tasks on compliant surfaces with narrow BOS, SLS, and head movements. Pt challenged by tasks on unlevel surfaces and fatigues easily, esp on rockerboard with use of hip and ankle strategy for balance. With initial task with blaze pods on step, pt with incr difficulty spotting blaze pod on L, but improved with incr reps and did significantly more taps the 2nd rep. Pt's vision impairments continues to be a big factor in regards to pt's balance. Will continue per POC.   OBJECTIVE IMPAIRMENTS: Abnormal gait, decreased balance, decreased mobility, difficulty walking, and decreased strength.   ACTIVITY LIMITATIONS: transfers and locomotion level  PARTICIPATION LIMITATIONS: driving, community activity, and fitness/leisure activities  PERSONAL FACTORS: 3+ comorbidities: PMH above are also affecting patient's functional outcome.   REHAB POTENTIAL: Good  CLINICAL DECISION MAKING: Stable/uncomplicated  EVALUATION COMPLEXITY: Low  PLAN:  PT FREQUENCY: 1x/week   PT DURATION: 4 weeks (per recert)  PLANNED INTERVENTIONS: 97750- Physical Performance Testing, 97110-Therapeutic exercises, 97530- Therapeutic activity, [id number]- Neuromuscular re-education, 97535- Self Care, [id number]- Gait training, Patient/Family education, and Balance training  PLAN FOR NEXT SESSION: continue balance with visual scanning activity; work on balance tasks on compliant surfaces, head motions  dynamic gait and balance - activities to incorporate visual scanning, esp. On Lt side   Sheffield LOISE Senate, PT, DPT 01/05/2024, 10:58 AM   Referring diagnosis?  Z86.73 (ICD-10-CM) - History of stroke  H54.7  (ICD-10-CM) - Vision loss  Z92.89 (ICD-10-CM) - History of recent hospitalization   Treatment diagnosis? (if different than referring diagnosis) R 26.81, R 26.89, M62.81 What was this (referring dx) caused by? []  Surgery []  Fall []  Ongoing issue []  Arthritis [x]  Other: ____________  Laterality: []  Rt []  Lt [x]  Both  Check all possible CPT codes:  *CHOOSE 10 OR LESS*    See Planned Interventions listed in the Plan section of the Evaluation.

## 2024-01-05 NOTE — Therapy (Signed)
 OUTPATIENT OCCUPATIONAL THERAPY NEURO TREATMENT   Patient Name: Terry Hays MRN: [id number] DOB:Oct 07, 1950, 73 y.o., male Today's Date: 01/05/2024  PCP: [name], MD REFERRING PROVIDER: Bulah Alm RAMAN, PA-C  END OF SESSION:  OT End of Session - 01/05/24 1103     Visit Number 14    Number of Visits 22    Date for Recertification  02/05/24    Authorization Type [employer] PPO--17 visits approved 11/10/23-01/05/24, requested another month through November    Authorization - Number of Visits 17    Progress Note Due on Visit 20    OT Start Time 1102    OT Stop Time 1145    OT Time Calculation (min) 43 min    Activity Tolerance Patient tolerated treatment well    Behavior During Therapy [facility] for tasks assessed/performed         Past Medical History:  Diagnosis Date   Abnormal ECG    Allergy    Anxiety    FLYING   Diverticulosis    Dyslipidemia    History of prostate cancer    Hyperlipidemia    Hypertension    LVH (left ventricular hypertrophy)    Pre-diabetes    Past Surgical History:  Procedure Laterality Date   COLONOSCOPY  2004 2011   MAGOD   EYE SURGERY  03/10/1999   CATOACTS BOTH   HERNIA REPAIR  03/09/1952   R INGHINAL   INGUINAL HERNIA REPAIR Left 04/07/2018   Procedure: OPERN LEFT INGUINAL HERNIA REPAIR WITH MESH ERAS PATHWAY;  Surgeon: Tanda Locus, MD;  Location: [facility];  Service: General;  Laterality: Left;   LOOP RECORDER INSERTION N/A 09/17/2023   Procedure: LOOP RECORDER INSERTION;  Surgeon: Lesia Ozell Barter, PA-C;  Location: [facility] INVASIVE CV LAB;  Service: Cardiovascular;  Laterality: N/A;   PENILE PROSTHESIS IMPLANT  03/09/2009   DAHLSTEDT   PROSTATE SURGERY  03/09/2001   PROSTRATECTOMY   TRANSESOPHAGEAL ECHOCARDIOGRAM (CATH LAB) N/A 12/13/2023   Procedure: TRANSESOPHAGEAL ECHOCARDIOGRAM;  Surgeon: Floretta Mallard, MD;  Location: [facility] INVASIVE CV LAB;  Service: Cardiovascular;  Laterality: N/A;   Patient Active Problem  List   Diagnosis Date Noted   Coronary artery disease involving native coronary artery of native heart without angina pectoris 12/03/2023   History of CVA (cerebrovascular accident) 11/06/2023   Cognitive impairment 09/12/2023   Right lower lobe pulmonary nodule 09/08/2023   Attention deficit hyperactivity disorder (ADHD), predominantly hyperactive type 06/03/2021   Cherry angioma 06/03/2021   Seborrheic keratosis 06/03/2021   Arthritis 03/17/2018   Spinal stenosis of lumbosacral region 03/17/2018   Actinic keratosis 03/17/2018   Degeneration of lumbar intervertebral disc 10/27/2017   History of prostate cancer 09/15/2011   Fear of flying 09/15/2011   Family history of heart disease in male family member before age 36 09/15/2011   Allergic rhinitis 09/15/2011   Hemorrhoids 09/15/2011   Hypertension 06/27/2010   Dyslipidemia    LVH (left ventricular hypertrophy)     ONSET DATE: late June 2025  REFERRING DIAG: Z86.73, Z92.89, H54.7  THERAPY DIAG:  Visuospatial deficit  Hemianopia, homonymous, left  Attention and concentration deficit  Frontal lobe and executive function deficit  Rationale for Evaluation and Treatment: Rehabilitation  SUBJECTIVE:   SUBJECTIVE STATEMENT: Sore from P.T. (hips and knees)   Denies pain and no falls  Pt accompanied by: self and spouse for end of session  PERTINENT HISTORY: CVA 09/08/23, hx of falls  PRECAUTIONS: None  WEIGHT BEARING RESTRICTIONS: No  PAIN:  Are  you having pain? No  FALLS: Has patient fallen in last 6 months? Yes. Number of falls unclear, but fell a couple weeks ago Not paying attention and has had stumbles but not falls.  LIVING ENVIRONMENT: Lives with: lives with their spouse Lives in: House/apartment Stairs: Yes: Internal: unclear but has a rail for internal steps steps; steps to enter home with rail and rail doing downstairs Has following equipment at home: Single point cane  PLOF: Independent, Independent  with basic ADLs, Independent with household mobility without device, and Independent with transfers  PATIENT GOALS: Pt would like to be able use his computer again for email, typing in word, etc. Would also love to be able to golf, play pickle ball etc. Ultimate goal is to drive.  OBJECTIVE:  Note: Objective measures were completed at Evaluation unless otherwise noted.  HAND DOMINANCE: Left  ADLs: Overall ADL: Mod I at this time, using a SPC for mobility.   IADLs: Overall pt is not driving and has assist for grocery shopping, bills, cooking, IADLs at this time.  Handwriting: 90% legible  MOBILITY STATUS: Hx of falls and needs SPC  POSTURE COMMENTS:  rounded shoulders Sitting balance: decreased core strength noted but overall demonstrates functional sitting balance.   ACTIVITY TOLERANCE: Activity tolerance: decreased but functional for ADL tasks, not able to do IADL tasks such as golf and pickleball  FUNCTIONAL OUTCOME MEASURES: NA - pt truly does not feel like he has LUE deficits from CVA and wants to work on vision  UPPER EXTREMITY ROM and MMT:     Active ROM Right eval Left eval  Shoulder flexion 150 WFL  (Blank rows = not tested)  MMT - Note mild deficits in B shoulders for shoulder flexion 2/2 old injuries from falls   HAND FUNCTION: WFL - note pt verbose and arrived late to evaluation, did not have time to asses grip strength as pt wanted to focus on vision. Will plan to assess in future sessions.   COORDINATION: Finger Nose Finger test: dysmetria and decreased coordination noted but unclear if this is from visual deficits to aim for other hand  11/25/23: 9 hole peg test: RT = 49.58 sec (slower d/t missing far Lt side and difficulty following directions even after multiple cues) Lt = 44.70 (difficulty following directions)   SENSATION: WFL  EDEMA: none  MUSCLE TONE: RUE: Within functional limits and LUE: Within functional limits  COGNITION: Overall cognitive  status: Impaired and Difficulty to assess due to: Note pt does not think deficits are significant but statements from S.O. in room states that cognition has been limited for comprehension, short term memory, and higher level tasks.   VISION: Subjective report: Pt reports he cannot see anything on the left - like its not even there. States he has learned various scanning and compensatory techniques.  Baseline vision: no glassess but demonstrates hemianopsia  Visual history: none prior to CVA   VISION ASSESSMENT: Impaired To be further assessed in functional context Eye alignment: Impaired:   Tracking/Visual pursuits: Decreased smoothness with horizontal tracking, Decreased smoothness with vertical tracking, Decreased smoothness of eye movement to Left superior field, Decreased smoothness of eye movement to Left inferior field, and Requires cues, head turns, or add eye shifts to track Visual Fields: Left homonymous hemianopsia  Patient has difficulty with following activities due to following visual impairments: driving, cooking, pickleball, golfing, has ad recent falls.   PERCEPTION: Impaired: Inattention/neglect: does not attend to left visual field  PRAXIS: Not tested  OBSERVATIONS:  The pt is a 73 yo male who is accompanied by his S.O. this date walking with a SPC who demonstrates dignificant visual deficits post CVA. Pt demonstrates slight deficits in BUE shoulder ROM but is fairly symmetrical and good MMT - does not feel like strength has been impacted from CVA from a neuro stand point. Pt with significant visual field deficits, L hemianopsia, and decreased attention to L visual field impacting skill performance.                                                                                                                             TREATMENT:    Played Connect 4 (2 trials) for visual scanning, planning ahead, and problem solving: pt required min to mod verbal cues to fully scan to Lt  side to prevent opponent from winning. Pt also had more difficulty with seeing diagonal Connect 4. Pt however did demo improvement with 2nd trial despite opponent winning.   Pt participated in 2 games of Uzzle at level(s) 2 using LUE for eye-hand coordination, visual scanning, and cognition by matching game pieces to visual patterns. Pt required max cueing to id correct blocks and to turn piece around correctly to follow pattern. Pt also required mod cueing to find blocks on Lt side of table.    PATIENT EDUCATION: Education details: see above Person educated: Patient  Education method: Explanation, Demonstration, and Verbal cues Education comprehension: verbalized understanding, returned demonstration, verbal cues required, and needs further education  HOME EXERCISE PROGRAM: 11/22/23:  Visual HEP and compensation strategies  11/25/23: coordination HEP  12/20/23: Golf Solitaire  GOALS: Goals reviewed with patient? Yes  SHORT TERM GOALS: Target date: 12/08/23  Pt will be Supervision with vision specific HEP to further integrate strategies and scanning into daily life for max recovery and benefit.  Baseline: Pt does incooperate techniques from hospital but would benefit from further training  Goal status: MET  2.  Pt will demonstrate improved visual scanning to locate 5 items in a room with no more than 2 verbal cues to locate items.  Baseline:  5 items, needing 3+ cues for scanning Goal status: MET in min distracting environment.  12/06/23  3.  Pt will demonstrate improved visual acuity and/or use of adaptations to use his personal computer for leisure activities such as email, typing in Word, etc with no more than Supervision.  Baseline: Pt reports significant difficulty with this Goal status: PARTIALLY MET w/ occasional cues (12/09/23 - Needed mod cueing to send email, use word, etc) 12/23/2023 - using phone more than computer  4.  Pt will perform a visual scanning task/worksheet of  choice with no more than 2 errors in order to demonstrate improved scanning and acuity.  Goal status: Ongoing  12/06/23 1.64M scanning sheet with 7 errors (93%)  12/22/2023: 1.64M double cancellation sheet (91% accuracy)  LONG TERM GOALS: Target date: 01/05/24  Pt will be MOD I with vision specific HEP to further integrate  strategies and scanning into daily life for max recovery and benefit.  Baseline: Pt does incooperate techniques from hospital but would benefit from further training  Goal status: IN Progress  2.  Pt will demonstrate improved visual scanning to locate 5 items in a room with no verbal cues to locate items.  Baseline:  5 items, needing 3+ cues for scanning Goal status: MET  3.  Pt will demonstrate improved visual acuity and/or use of adaptations to use his personal computer for leisure activities such as email, typing in Word, etc with Independence. Baseline: Pt reports significant difficulty with this Goal status: IN PROGRESS (have practiced in clinic but continues to need some assist)   4.  If safe and appropriate, pt will report a return to some leisure activities such as modified golf, pickle ball, walking, etc with use of strategies, AE, or compensatory techniques to improve safety and participation.  Baseline: not safe and not performing at this time 12/22/2023: pt able to walk to therapy appointment today Goal status: IN PROGRESS (pt has returned to all but pickle ball d/t vision and balance)  5.  Pt will perform environmental scanning in moderately distracting gym consistently at 80% accuracy  Baseline: 01/03/24 = 80% accuracy down quiet hallway only with extra time Goal status: INITIAL   ASSESSMENT:  CLINICAL IMPRESSION: Pt has met 3/4 STG's and 1 LTG. Pt currently progressing towards remaining LTG's and added new goal (LTG #5) for renewal period. Pt continues to require cues to look to Lt side. Pt also continues to demo deficits in visual/perceptual skills  (particularly with puzzles and puzzle type games orienting pcs correctly). Pt would continue to benefit from skilled O.T. to address these remaining goals, improve safety, and maximize function.   PERFORMANCE DEFICITS: in functional skills including ADLs, IADLs, proprioception, endurance, and vision, cognitive skills including attention, memory, problem solving, safety awareness, and sequencing, and psychosocial skills including coping strategies, environmental adaptation, habits, and routines and behaviors.   IMPAIRMENTS: are limiting patient from ADLs, IADLs, leisure, and social participation.   CO-MORBIDITIES: has no other co-morbidities that affects occupational performance. Patient will benefit from skilled OT to address above impairments and improve overall function.  REHAB POTENTIAL: Good  PLAN:  OT FREQUENCY: 2x/week  OT DURATION: 4 additional weeks (through end of November)  PLANNED INTERVENTIONS: 02831 OT Re-evaluation, 97535 self care/ADL training, 02889 therapeutic exercise, 97530 therapeutic activity, and 97112 neuromuscular re-education  RECOMMENDED OTHER SERVICES: NA  CONSULTED AND AGREED WITH PLAN OF CARE: Patient and family member/caregiver  PLAN FOR NEXT SESSION:  CHECK FOR [employer] AUTH FROM RENEWAL (Form submitted), [email or web address] Work on therapist, art and [employer] for [employer] (per pt request)   Burnard JINNY Roads, OTR/L 01/05/2024, 12:10 PM

## 2024-01-06 DIAGNOSIS — Z8673 Personal history of transient ischemic attack (TIA), and cerebral infarction without residual deficits: Secondary | ICD-10-CM | POA: Diagnosis not present

## 2024-01-06 DIAGNOSIS — Z83518 Family history of other specified eye disorder: Secondary | ICD-10-CM | POA: Diagnosis not present

## 2024-01-06 DIAGNOSIS — Z961 Presence of intraocular lens: Secondary | ICD-10-CM | POA: Diagnosis not present

## 2024-01-06 DIAGNOSIS — H53462 Homonymous bilateral field defects, left side: Secondary | ICD-10-CM | POA: Diagnosis not present

## 2024-01-06 LAB — OPHTHALMOLOGY REPORT-SCANNED

## 2024-01-07 DIAGNOSIS — K5901 Slow transit constipation: Secondary | ICD-10-CM | POA: Insufficient documentation

## 2024-01-10 ENCOUNTER — Encounter: Admitting: Occupational Therapy

## 2024-01-12 ENCOUNTER — Ambulatory Visit: Attending: Medical | Admitting: Occupational Therapy

## 2024-01-12 ENCOUNTER — Ambulatory Visit: Admitting: Physical Therapy

## 2024-01-12 ENCOUNTER — Encounter: Payer: Self-pay | Admitting: Occupational Therapy

## 2024-01-12 DIAGNOSIS — R2681 Unsteadiness on feet: Secondary | ICD-10-CM | POA: Diagnosis not present

## 2024-01-12 DIAGNOSIS — R41842 Visuospatial deficit: Secondary | ICD-10-CM | POA: Diagnosis not present

## 2024-01-12 DIAGNOSIS — H53462 Homonymous bilateral field defects, left side: Secondary | ICD-10-CM | POA: Insufficient documentation

## 2024-01-12 DIAGNOSIS — R41844 Frontal lobe and executive function deficit: Secondary | ICD-10-CM | POA: Insufficient documentation

## 2024-01-12 DIAGNOSIS — R278 Other lack of coordination: Secondary | ICD-10-CM | POA: Insufficient documentation

## 2024-01-12 DIAGNOSIS — R2689 Other abnormalities of gait and mobility: Secondary | ICD-10-CM | POA: Insufficient documentation

## 2024-01-12 DIAGNOSIS — R41840 Attention and concentration deficit: Secondary | ICD-10-CM | POA: Diagnosis not present

## 2024-01-12 DIAGNOSIS — M6281 Muscle weakness (generalized): Secondary | ICD-10-CM | POA: Diagnosis not present

## 2024-01-12 NOTE — Therapy (Signed)
 OUTPATIENT OCCUPATIONAL THERAPY NEURO TREATMENT   Patient Name: Terry Hays MRN: [id number] DOB:05-20-1950, 73 y.o., male Today's Date: 01/12/2024  PCP: [name], MD REFERRING PROVIDER: Bulah Alm RAMAN, PA-C  END OF SESSION:  OT End of Session - 01/12/24 1103     Visit Number 15    Number of Visits 22    Date for Recertification  02/05/24    Authorization Type [employer] P[employer]--17 visits approved 11/10/23-01/05/24, approved 8 visits from 01/05/24 - 04/04/24    Authorization - Visit Number 2    Authorization - Number of Visits 8    Progress Note Due on Visit 20    OT Start Time 1100    OT Stop Time 1145    OT Time Calculation (min) 45 min    Activity Tolerance Patient tolerated treatment well    Behavior During Therapy [facility] for tasks assessed/performed         Past Medical History:  Diagnosis Date   Abnormal ECG    Allergy    Anxiety    FLYING   Diverticulosis    Dyslipidemia    History of prostate cancer    Hyperlipidemia    Hypertension    LVH (left ventricular hypertrophy)    Pre-diabetes    Past Surgical History:  Procedure Laterality Date   COLONOSCOPY  2004 2011   MAGOD   EYE SURGERY  03/10/1999   CATOACTS BOTH   HERNIA REPAIR  03/09/1952   R INGHINAL   INGUINAL HERNIA REPAIR Left 04/07/2018   Procedure: OPERN LEFT INGUINAL HERNIA REPAIR WITH MESH ERAS PATHWAY;  Surgeon: Tanda Locus, MD;  Location: [facility];  Service: General;  Laterality: Left;   LOOP RECORDER INSERTION N/A 09/17/2023   Procedure: LOOP RECORDER INSERTION;  Surgeon: Lesia Ozell Barter, PA-C;  Location: [facility] INVASIVE CV LAB;  Service: Cardiovascular;  Laterality: N/A;   PENILE PROSTHESIS IMPLANT  03/09/2009   DAHLSTEDT   PROSTATE SURGERY  03/09/2001   PROSTRATECTOMY   TRANSESOPHAGEAL ECHOCARDIOGRAM (CATH LAB) N/A 12/13/2023   Procedure: TRANSESOPHAGEAL ECHOCARDIOGRAM;  Surgeon: Floretta Mallard, MD;  Location: [facility] INVASIVE CV LAB;  Service: Cardiovascular;   Laterality: N/A;   Patient Active Problem List   Diagnosis Date Noted   Coronary artery disease involving native coronary artery of native heart without angina pectoris 12/03/2023   History of CVA (cerebrovascular accident) 11/06/2023   Cognitive impairment 09/12/2023   Right lower lobe pulmonary nodule 09/08/2023   Attention deficit hyperactivity disorder (ADHD), predominantly hyperactive type 06/03/2021   Cherry angioma 06/03/2021   Seborrheic keratosis 06/03/2021   Arthritis 03/17/2018   Spinal stenosis of lumbosacral region 03/17/2018   Actinic keratosis 03/17/2018   Degeneration of lumbar intervertebral disc 10/27/2017   History of prostate cancer 09/15/2011   Fear of flying 09/15/2011   Family history of heart disease in male family member before age 2 09/15/2011   Allergic rhinitis 09/15/2011   Hemorrhoids 09/15/2011   Hypertension 06/27/2010   Dyslipidemia    LVH (left ventricular hypertrophy)     ONSET DATE: late June 2025  REFERRING DIAG: Z86.73, Z92.89, H54.7  THERAPY DIAG:  Attention and concentration deficit  Visuospatial deficit  Other lack of coordination  Frontal lobe and executive function deficit  Rationale for Evaluation and Treatment: Rehabilitation  SUBJECTIVE:   SUBJECTIVE STATEMENT: I got prism  sunglasses last Thursday at [facility] because I forgot my regular glasses, but I got the prescription to take my regular glasses to an optician.  I drove a golf cart  yesterday on the golf course. I also took the bus yesterday and did ok. I had a stumble but didn't fall over the sidewalk. No pain today but sore from golf yesterday.   Pt accompanied by: self and spouse for end of session  PERTINENT HISTORY: CVA 09/08/23, hx of falls  PRECAUTIONS: None  WEIGHT BEARING RESTRICTIONS: No  PAIN:  Are you having pain? No  FALLS: Has patient fallen in last 6 months? Yes. Number of falls unclear, but fell a couple weeks ago Not paying attention and has had  stumbles but not falls.  LIVING ENVIRONMENT: Lives with: lives with their spouse Lives in: House/apartment Stairs: Yes: Internal: unclear but has a rail for internal steps steps; steps to enter home with rail and rail doing downstairs Has following equipment at home: Single point cane  PLOF: Independent, Independent with basic ADLs, Independent with household mobility without device, and Independent with transfers  PATIENT GOALS: Pt would like to be able use his computer again for email, typing in word, etc. Would also love to be able to golf, play pickle ball etc. Ultimate goal is to drive.  OBJECTIVE:  Note: Objective measures were completed at Evaluation unless otherwise noted.  HAND DOMINANCE: Left  ADLs: Overall ADL: Mod I at this time, using a SPC for mobility.   IADLs: Overall pt is not driving and has assist for grocery shopping, bills, cooking, IADLs at this time.  Handwriting: 90% legible  MOBILITY STATUS: Hx of falls and needs SPC  POSTURE COMMENTS:  rounded shoulders Sitting balance: decreased core strength noted but overall demonstrates functional sitting balance.   ACTIVITY TOLERANCE: Activity tolerance: decreased but functional for ADL tasks, not able to do IADL tasks such as golf and pickleball  FUNCTIONAL OUTCOME MEASURES: NA - pt truly does not feel like he has LUE deficits from CVA and wants to work on vision  UPPER EXTREMITY ROM and MMT:     Active ROM Right eval Left eval  Shoulder flexion 150 WFL  (Blank rows = not tested)  MMT - Note mild deficits in B shoulders for shoulder flexion 2/2 old injuries from falls   HAND FUNCTION: WFL - note pt verbose and arrived late to evaluation, did not have time to asses grip strength as pt wanted to focus on vision. Will plan to assess in future sessions.   COORDINATION: Finger Nose Finger test: dysmetria and decreased coordination noted but unclear if this is from visual deficits to aim for other hand   11/25/23: 9 hole peg test: RT = 49.58 sec (slower d/t missing far Lt side and difficulty following directions even after multiple cues) Lt = 44.70 (difficulty following directions)   SENSATION: WFL  EDEMA: none  MUSCLE TONE: RUE: Within functional limits and LUE: Within functional limits  COGNITION: Overall cognitive status: Impaired and Difficulty to assess due to: Note pt does not think deficits are significant but statements from S.O. in room states that cognition has been limited for comprehension, short term memory, and higher level tasks.   VISION: Subjective report: Pt reports he cannot see anything on the left - like its not even there. States he has learned various scanning and compensatory techniques.  Baseline vision: no glassess but demonstrates hemianopsia  Visual history: none prior to CVA   VISION ASSESSMENT: Impaired To be further assessed in functional context Eye alignment: Impaired:   Tracking/Visual pursuits: Decreased smoothness with horizontal tracking, Decreased smoothness with vertical tracking, Decreased smoothness of eye movement to Left superior  field, Decreased smoothness of eye movement to Left inferior field, and Requires cues, head turns, or add eye shifts to track Visual Fields: Left homonymous hemianopsia  Patient has difficulty with following activities due to following visual impairments: driving, cooking, pickleball, golfing, has ad recent falls.   PERCEPTION: Impaired: Inattention/neglect: does not attend to left visual field  PRAXIS: Not tested  OBSERVATIONS: The pt is a 73 yo male who is accompanied by his S.O. this date walking with a SPC who demonstrates dignificant visual deficits post CVA. Pt demonstrates slight deficits in BUE shoulder ROM but is fairly symmetrical and good MMT - does not feel like strength has been impacted from CVA from a neuro stand point. Pt with significant visual field deficits, L hemianopsia, and decreased attention  to L visual field impacting skill performance.                                                                                                                             TREATMENT:    Discussed subjective report today (see above). Pt got prisms placed in sunglasses as he forgot to bring regular glasses to appointment. He reports they wrote a prescription for him to take his regular glasses to an optician for necessary adjustments.  Pt reports he was wearing his prism  sunglasses playing golf yesterday  Performed internet search, logging into [employer] account and looking for Christmas presents, adding and deleting to cart w/ mod cues to scan to Lt side  Pt also going on [email or web address] to play games (keyboard jump) w/ cues to scan to see words further Lt side. Pt also with min to mod errors for typing Lt sided words   PATIENT EDUCATION: Education details: see above Person educated: Patient  Education method: Explanation, Demonstration, and Verbal cues Education comprehension: verbalized understanding, returned demonstration, verbal cues required, and needs further education  HOME EXERCISE PROGRAM: 11/22/23:  Visual HEP and compensation strategies  11/25/23: coordination HEP  12/20/23: Golf Solitaire  GOALS: Goals reviewed with patient? Yes  SHORT TERM GOALS: Target date: 12/08/23  Pt will be Supervision with vision specific HEP to further integrate strategies and scanning into daily life for max recovery and benefit.  Baseline: Pt does incooperate techniques from hospital but would benefit from further training  Goal status: MET  2.  Pt will demonstrate improved visual scanning to locate 5 items in a room with no more than 2 verbal cues to locate items.  Baseline:  5 items, needing 3+ cues for scanning Goal status: MET in min distracting environment.  12/06/23  3.  Pt will demonstrate improved visual acuity and/or use of adaptations to use his personal computer for leisure activities such as  email, typing in Word, etc with no more than Supervision.  Baseline: Pt reports significant difficulty with this Goal status: PARTIALLY MET w/ occasional cues (12/09/23 - Needed mod cueing to send email, use word, etc) 12/23/2023 - using phone more than computer  4.  Pt will perform a visual scanning task/worksheet of choice with no more than 2 errors in order to demonstrate improved scanning and acuity.  Goal status: Ongoing  12/06/23 1.93M scanning sheet with 7 errors (93%)  12/22/2023: 1.93M double cancellation sheet (91% accuracy)  LONG TERM GOALS: Target date: 01/05/24  Pt will be MOD I with vision specific HEP to further integrate strategies and scanning into daily life for max recovery and benefit.  Baseline: Pt does incooperate techniques from hospital but would benefit from further training  Goal status: IN Progress  2.  Pt will demonstrate improved visual scanning to locate 5 items in a room with no verbal cues to locate items.  Baseline:  5 items, needing 3+ cues for scanning Goal status: MET  3.  Pt will demonstrate improved visual acuity and/or use of adaptations to use his personal computer for leisure activities such as email, typing in Word, etc with Independence. Baseline: Pt reports significant difficulty with this Goal status: IN PROGRESS (have practiced in clinic but continues to need some assist)   4.  If safe and appropriate, pt will report a return to some leisure activities such as modified golf, pickle ball, walking, etc with use of strategies, AE, or compensatory techniques to improve safety and participation.  Baseline: not safe and not performing at this time 12/22/2023: pt able to walk to therapy appointment today Goal status: IN PROGRESS (pt has returned to all but pickle ball d/t vision and balance)  5.  Pt will perform environmental scanning in moderately distracting gym consistently at 80% accuracy  Baseline: 01/03/24 = 80% accuracy down quiet hallway only  with extra time Goal status: INITIAL   ASSESSMENT:  CLINICAL IMPRESSION: Pt has met 3/4 STG's and 1 LTG. Pt currently progressing towards remaining LTG's and added new goal (LTG #5) for renewal period. Pt continues to require cues to look to Lt side. Pt also continues to demo deficits in visual/perceptual skills (particularly with puzzles and puzzle type games orienting pcs correctly). Pt would continue to benefit from skilled O.T. to address these remaining goals, improve safety, and maximize function.   PERFORMANCE DEFICITS: in functional skills including ADLs, IADLs, proprioception, endurance, and vision, cognitive skills including attention, memory, problem solving, safety awareness, and sequencing, and psychosocial skills including coping strategies, environmental adaptation, habits, and routines and behaviors.   IMPAIRMENTS: are limiting patient from ADLs, IADLs, leisure, and social participation.   CO-MORBIDITIES: has no other co-morbidities that affects occupational performance. Patient will benefit from skilled OT to address above impairments and improve overall function.  REHAB POTENTIAL: Good  PLAN:  OT FREQUENCY: 2x/week  OT DURATION: 4 additional weeks (through end of November)  PLANNED INTERVENTIONS: 02831 OT Re-evaluation, 97535 self care/ADL training, 02889 therapeutic exercise, 97530 therapeutic activity, and 97112 neuromuscular re-education  RECOMMENDED OTHER SERVICES: NA  CONSULTED AND AGREED WITH PLAN OF CARE: Patient and family member/caregiver  PLAN FOR NEXT SESSION:  More typing games for scanning/typing skills, consider blaze pods, copying simple design, modified Londa, visual memory (matching cards - limit to 10 cards/5 matches, etc), dominoes, dice game  Burnard JINNY Roads, OTR/L 01/12/2024, 11:06 AM

## 2024-01-13 ENCOUNTER — Ambulatory Visit: Admitting: Physical Therapy

## 2024-01-13 ENCOUNTER — Encounter: Payer: Self-pay | Admitting: Occupational Therapy

## 2024-01-13 ENCOUNTER — Ambulatory Visit: Admitting: Occupational Therapy

## 2024-01-13 ENCOUNTER — Encounter: Payer: Self-pay | Admitting: Physical Therapy

## 2024-01-13 DIAGNOSIS — R41840 Attention and concentration deficit: Secondary | ICD-10-CM | POA: Diagnosis not present

## 2024-01-13 DIAGNOSIS — R41842 Visuospatial deficit: Secondary | ICD-10-CM

## 2024-01-13 DIAGNOSIS — R278 Other lack of coordination: Secondary | ICD-10-CM | POA: Diagnosis not present

## 2024-01-13 DIAGNOSIS — H53462 Homonymous bilateral field defects, left side: Secondary | ICD-10-CM | POA: Diagnosis not present

## 2024-01-13 DIAGNOSIS — R2681 Unsteadiness on feet: Secondary | ICD-10-CM

## 2024-01-13 DIAGNOSIS — R2689 Other abnormalities of gait and mobility: Secondary | ICD-10-CM | POA: Diagnosis not present

## 2024-01-13 DIAGNOSIS — M6281 Muscle weakness (generalized): Secondary | ICD-10-CM | POA: Diagnosis not present

## 2024-01-13 DIAGNOSIS — R41844 Frontal lobe and executive function deficit: Secondary | ICD-10-CM | POA: Diagnosis not present

## 2024-01-13 NOTE — Therapy (Signed)
 OUTPATIENT PHYSICAL THERAPY NEURO TREATMENT NOTE/RE-CERT   Patient Name: Terry Hays MRN: [id number] DOB:1950-08-02, 73 y.o., male Today's Date: 01/13/2024   PCP: [name], MD  REFERRING PROVIDER: Bulah Alm RAMAN, PA-C   END OF SESSION:  PT End of Session - 01/13/24 1847     Visit Number 8    Number of Visits 12    Date for Recertification  02/11/24   from re-cert   Authorization Type [employer]-submitted auth at eval    Authorization Time Period 4 PT visits 10/13-1/11/26    Authorization - Visit Number 4   4/4 visits in this auth. period   Authorization - Number of Visits --    Progress Note Due on Visit 10    PT Start Time 1019    PT Stop Time 1102    PT Time Calculation (min) 43 min    Equipment Utilized During Treatment Gait belt    Activity Tolerance Patient tolerated treatment well    Behavior During Therapy WFL for tasks assessed/performed            Past Medical History:  Diagnosis Date   Abnormal ECG    Allergy    Anxiety    FLYING   Diverticulosis    Dyslipidemia    History of prostate cancer    Hyperlipidemia    Hypertension    LVH (left ventricular hypertrophy)    Pre-diabetes    Past Surgical History:  Procedure Laterality Date   COLONOSCOPY  2004 2011   MAGOD   EYE SURGERY  03/10/1999   CATOACTS BOTH   HERNIA REPAIR  03/09/1952   R INGHINAL   INGUINAL HERNIA REPAIR Left 04/07/2018   Procedure: OPERN LEFT INGUINAL HERNIA REPAIR WITH MESH ERAS PATHWAY;  Surgeon: Tanda Locus, MD;  Location: [facility];  Service: General;  Laterality: Left;   LOOP RECORDER INSERTION N/A 09/17/2023   Procedure: LOOP RECORDER INSERTION;  Surgeon: Lesia Ozell Barter, PA-C;  Location: [facility]VASIVE CV LAB;  Service: Cardiovascular;  Laterality: N/A;   PENILE PROSTHESIS IMPLANT  03/09/2009   DAHLSTEDT   PROSTATE SURGERY  03/09/2001   PROSTRATECTOMY   TRANSESOPHAGEAL ECHOCARDIOGRAM (CATH LAB) N/A 12/13/2023   Procedure: TRANSESOPHAGEAL  ECHOCARDIOGRAM;  Surgeon: Floretta Mallard, MD;  Location: [facility] INVASIVE CV LAB;  Service: Cardiovascular;  Laterality: N/A;   Patient Active Problem List   Diagnosis Date Noted   Coronary artery disease involving native coronary artery of native heart without angina pectoris 12/03/2023   History of CVA (cerebrovascular accident) 11/06/2023   Cognitive impairment 09/12/2023   Right lower lobe pulmonary nodule 09/08/2023   Attention deficit hyperactivity disorder (ADHD), predominantly hyperactive type 06/03/2021   Cherry angioma 06/03/2021   Seborrheic keratosis 06/03/2021   Arthritis 03/17/2018   Spinal stenosis of lumbosacral region 03/17/2018   Actinic keratosis 03/17/2018   Degeneration of lumbar intervertebral disc 10/27/2017   History of prostate cancer 09/15/2011   Fear of flying 09/15/2011   Family history of heart disease in male family member before age 74 09/15/2011   Allergic rhinitis 09/15/2011   Hemorrhoids 09/15/2011   Hypertension 06/27/2010   Dyslipidemia    LVH (left ventricular hypertrophy)     ONSET DATE: 10/20/2023 (MD referral)  REFERRING DIAG:  Z86.73 (ICD-10-CM) - History of stroke  H54.7 (ICD-10-CM) - Vision loss  Z92.89 (ICD-10-CM) - History of recent hospitalization    THERAPY DIAG:  Unsteadiness on feet - Plan: PT plan of care cert/re-cert  Other abnormalities of gait and mobility -  Plan: PT plan of care cert/re-cert  Hemianopia, homonymous, left - Plan: PT plan of care cert/re-cert  Rationale for Evaluation and Treatment: Rehabilitation  SUBJECTIVE:                                                                                                                                                                                             SUBJECTIVE STATEMENT:  Pt reports he went to vision clinic at [facility] last week and got a prism  for his sunglasses - says it helps a lot.  Will be getting prism  in his eyeglasses soon.  Drove a golf cart - says he did  fine.  Discussed discharging from PT today as today is 4/4 visit authorized in this auth. Period - pt requests to continue PT for few more visits as he feels this is very helpful.  Says he continues to stumble but has been able to recover his balance.  Feels that visual scanning activities with balance is very helpful for him.    Pt accompanied by: self  PERTINENT HISTORY: PMH prior CVAs (unknown to patient), ADHD, HTN, HLD, prostate cancer, diverticulosis who presented to the ER with confusion and left visual field changes (09/2023)  PAIN:  Are you having pain? No  PRECAUTIONS: Fall and Other: decreased peripheral vision from CVA   Avoid driving  RED FLAGS: None   WEIGHT BEARING RESTRICTIONS: No  FALLS: Has patient fallen in last 6 months? Yes. Number of falls 1-R knee gave way  LIVING ENVIRONMENT: Lives with: lives with their spouse Lives in: House/apartment Stairs: steps to enter with rails; upstairs and downstairs with rail Has following equipment at home: Single point cane  PLOF: Independent and Leisure: played pickleball and golf; was working out for core strengthening and walking several miles everyday  PATIENT GOALS: To get back to playing pickleball [facility]) and golf  OBJECTIVE:  Note: Objective measures were completed at Evaluation unless otherwise noted.  DIAGNOSTIC FINDINGS: acute CVA of the right posterior cerebral artery,   COGNITION: Overall cognitive status: Within functional limits for tasks assessed   SENSATION: Light touch: WFL  COORDINATION: WFL for bilat alt taps   MUSCLE TONE: WFL  POSTURE: rounded shoulders  LOWER EXTREMITY ROM:   WFL BLEs   LOWER EXTREMITY MMT:    MMT Right Eval Left Eval  Hip flexion 4 4+  Hip extension    Hip abduction 5 5  Hip adduction 5 5  Hip internal rotation    Hip external rotation    Knee flexion 5 5  Knee extension 4 4  Ankle dorsiflexion 4 4  Ankle plantarflexion  Ankle inversion    Ankle  eversion    (Blank rows = not tested)  TRANSFERS: Sit to stand: Modified independence  Assistive device utilized: None     Stand to sit: Modified independence  Assistive device utilized: None      GAIT: Findings: Gait Characteristics: veers to R, step through pattern, and wide BOS, Distance walked: 50 ft, Assistive device utilized:Single point cane, and Level of assistance: SBA  FUNCTIONAL TESTS:  5 times sit to stand: 13.91 sec Timed up and go (TUG): NT 10 meter walk test: 13.41 veers to R= 2.44 ft/sec FGA= 16/30  M-CTSIB  Condition 1: Firm Surface, EO 30 Sec, Normal Sway  Condition 2: Firm Surface, EC 30 Sec, Normal Sway  Condition 3: Foam Surface, EO 30 Sec, Mild Sway  Condition 4: Foam Surface, EC 30 Sec, Mild and Moderate Sway                                                                                                                                TREATMENT DATE: 01-13-24  Gait:  [facility] PT Assessment - 01/13/24 0001       Functional Gait  Assessment   Gait Level Surface Walks 20 ft in less than 5.5 sec, no assistive devices, good speed, no evidence for imbalance, normal gait pattern, deviates no more than 6 in outside of the 12 in walkway width.   5.47 secs   Change in Gait Speed Able to smoothly change walking speed without loss of balance or gait deviation. Deviate no more than 6 in outside of the 12 in walkway width.    Gait with Horizontal Head Turns Performs head turns smoothly with slight change in gait velocity (eg, minor disruption to smooth gait path), deviates 6-10 in outside 12 in walkway width, or uses an assistive device.    Gait with Vertical Head Turns Performs head turns with no change in gait. Deviates no more than 6 in outside 12 in walkway width.    Gait and Pivot Turn Pivot turns safely within 3 sec and stops quickly with no loss of balance.    Step Over Obstacle Is able to step over 2 stacked shoe boxes taped together (9 in total height) without changing  gait speed. No evidence of imbalance.    Gait with Narrow Base of Support Ambulates 7-9 steps.    Gait with Eyes Closed Walks 20 ft, uses assistive device, slower speed, mild gait deviations, deviates 6-10 in outside 12 in walkway width. Ambulates 20 ft in less than 9 sec but greater than 7 sec.   7.66 secs   Ambulating Backwards Walks 20 ft, no assistive devices, good speed, no evidence for imbalance, normal gait    Steps Alternating feet, must use rail.    Total Score 26          Gait velocity:  10.53 secs = 3.11 ft/sec (without device)  TherAct:  Visual scanning activity -  red and blue mats placed on floor - pt performed marching in place with Squigz (5) placed on outside rail Of // bar and 5 squigz placed on mat table across from // bars; pt marched forward and named color of Squigz on Lt side (rail on // bar) and on Rt side (on mat) - performed this activity 2 reps  Squigz placed on each side of mats on floor and also on end of mat  - specific color named and pt performed visual scanning with walking/turning on mat to locate that specific Squigz and then bent down to pick up off floor - CGA with verbal cues to attend and scan on pt's Lt side  Rockerboard - inside // bars - EO 10 reps Progressed to standing on board and performing visual scanning to name card  (I.e. 7 of hearts) held in various locations for visual scanning - no UE support used; pt able to recover balance independently   Pt performed rocking with EC 10 reps without UE support with CGA to SBA       PATIENT EDUCATION: Education details: Continue HEP and importance of performing at home  Person educated: Patient Education method: Explanation, Demonstration, and Verbal cues Education comprehension: verbalized understanding, returned demonstration, and needs further education  HOME EXERCISE PROGRAM: Access Code: [email or web address] URL: [email or web address] Date: 12/06/2023 Prepared by: [place] Senate  Exercises - Single Leg Stance with Support  - 1 x daily - 7 x weekly - 1 sets - 2-3 reps - 10 secs hold - Tandem Stance with Chair Support  - 1 x daily - 7 x weekly - 1 sets - 2 reps - 30 secs  hold - Standing Single Leg Heel Raise  - 1 x daily - 7 x weekly - 1 sets - 10 reps - 2 secs hold - Walking with Head Rotation  - 1 x daily - 7 x weekly - 1 sets - 10 reps - Romberg Stance Eyes Closed on Foam Pad  - 1 x daily - 5 x weekly - 3 sets - 30 hold - Romberg Stance on Foam Pad with Head Rotation  - 1 x daily - 5 x weekly - 2 sets - 10 reps  GOALS: Goals reviewed with patient? Yes  SHORT TERM GOALS: Target date: 11/19/2023  Pt will be independent with HEP for improved balance and gait. Baseline: Goal status: Not met - 1st PT appt 11-22-23  2.  Pt will improve 5x sit<>stand to less than or equal to 12 sec to demonstrate improved functional strength and transfer efficiency. Baseline: 13.41 sec; 11-22-23 - 10.22 secs without UE support from chair Goal status: Goal met 11-22-23   LONG TERM GOALS: Target date: 12/17/2023  Pt will be independent with HEP for improved balance, gait. Baseline:  Goal status: Goal met 12-20-23  2.  Pt will improve FGA score to at least 23/30 to decrease fall risk. Baseline: 16/30;  23/30 (12-20-23) Goal status: Goal met   3.  Pt will improve gait velocity to at least 2.62 ft/sec for improved gait efficiency and safety. Baseline: 2.44 ft/sec;  12-20-23:  13.63, 11.75 = 2.79 ft/sec  Goal status: Goal met 12-20-23  4.  Pt will verbalize understanding of fall prevention in home environment. Baseline:  Goal status: Goal met 12-20-23  5.  Pt will verbalize plans for continued community fitness upon d/c from PT, to maximize gains made in PT. Baseline:  Goal status: Ongoing 12-20-23   NEW LONG TERM GOALS:  Target Date:  01-21-24    Pt will improve FGA score to at least 25/30 to decrease fall risk.    Baseline: 16/30;  23/30 (12-20-23); score  26/30    Goal status:  Goal met 01-13-24  2.  Pt will improve gait velocity to at least 3.0 ft/sec for improved gait efficiency and safety. Baseline: 2.44 ft/sec;  12-20-23:  13.63, 11.75 = 2.79 ft/sec; 10.53 secs = 3.11 ft/sec  Goal status:  Goal met 01-13-24  NEW UPDATED LONG TERM GOALS:  Target Date:  02-11-24    Pt will improve FGA score to at least 28/30 to decrease fall risk. Baseline: 16/30;  23/30 (12-20-23); score 26/30 Goal status:  Upgraded  2.  Pt will improve gait velocity to at least 3.4 ft/sec for improved gait efficiency and safety. Baseline: 2.44 ft/sec;  12-20-23:  13.63, 11.75 = 2.79 ft/sec; 10.53 secs = 3.11 ft/sec  Goal status:  Upgraded  3.  Pt will subjectively report at least 50% improvement in ability to recover LOB when he stumbles.    Baseline:   Goal status:  New    ASSESSMENT:  CLINICAL IMPRESSION: PT session focused on assessment of the 2 LTG's in this cert. period as today's visit was #4 of the 4 authorized visits in this auth. Period.  Pt has met the 2 new LTG's.  Pt's FGA score has increased from 23/30 on 12-20-23 to 26/30 in today's session.  Pt's gait velocity has increased from 2.79 ft/sec on 12-20-23 to 3.11 ft/sec in today's session with no use of assistive device.  Pt's balance is impacted by pt's Lt visual field deficits.  Pt needs verbal cues to increase Lt cervical rotation to visually scan and locate objects on his left side.  Pt requests to continue with PT services; renewal completed for 4 additional visits.  Cont with POC.   OBJECTIVE IMPAIRMENTS: Abnormal gait, decreased balance, decreased mobility, difficulty walking, and decreased strength.   ACTIVITY LIMITATIONS: transfers and locomotion level  PARTICIPATION LIMITATIONS: driving, community activity, and fitness/leisure activities  PERSONAL FACTORS: 3+ comorbidities: PMH above are also affecting patient's functional outcome.   REHAB POTENTIAL: Good  CLINICAL DECISION MAKING:  Stable/uncomplicated  EVALUATION COMPLEXITY: Low  PLAN:  PT FREQUENCY: 1x/week   PT DURATION: 4 weeks (per recert)  PLANNED INTERVENTIONS: 97750- Physical Performance Testing, 97110-Therapeutic exercises, 97530- Therapeutic activity, [id number]- Neuromuscular re-education, 97535- Self Care, [id number]- Gait training, Patient/Family education, and Balance training  PLAN FOR NEXT SESSION: continue balance with visual scanning activity; work on balance tasks on compliant surfaces, head motions  dynamic gait and balance - activities to incorporate visual scanning, esp. On Lt side   R[place]anna [place], PT 01/13/2024, 7:46 PM   Referring diagnosis?  Z86.73 (ICD-10-CM) - History of stroke  H54.7 (ICD-10-CM) - Vision loss  Z92.89 (ICD-10-CM) - History of recent hospitalization   Treatment diagnosis? (if different than referring diagnosis) R 26.81, R 26.89, M62.81 What was this (referring dx) caused by? []  Surgery []  Fall []  Ongoing issue []  Arthritis [x]  Other: ____________  Laterality: []  Rt []  Lt [x]  Both  Check all possible CPT codes:  *CHOOSE 10 OR LESS*    See Planned Interventions listed in the Plan section of the Evaluation.

## 2024-01-13 NOTE — Therapy (Signed)
 OUTPATIENT OCCUPATIONAL THERAPY NEURO TREATMENT   Patient Name: Terry Hays MRN: [id number] DOB:Aug 02, 1950, 73 y.o., male Today's Date: 01/13/2024  PCP: [name], MD REFERRING PROVIDER: Bulah Alm RAMAN, PA-C  END OF SESSION:  OT End of Session - 01/13/24 0935     Visit Number 16    Number of Visits 22    Date for Recertification  02/05/24    Authorization Type [employer] PPO--17 visits approved 11/10/23-01/05/24, approved 8 visits from 01/05/24 - 04/04/24    Authorization - Visit Number 3    Authorization - Number of Visits 8    Progress Note Due on Visit 20    OT Start Time 0930    OT Stop Time 1015    OT Time Calculation (min) 45 min    Activity Tolerance Patient tolerated treatment well    Behavior During Therapy [facility] for tasks assessed/performed         Past Medical History:  Diagnosis Date   Abnormal ECG    Allergy    Anxiety    FLYING   Diverticulosis    Dyslipidemia    History of prostate cancer    Hyperlipidemia    Hypertension    LVH (left ventricular hypertrophy)    Pre-diabetes    Past Surgical History:  Procedure Laterality Date   COLONOSCOPY  2004 2011   MAGOD   EYE SURGERY  03/10/1999   CATOACTS BOTH   HERNIA REPAIR  03/09/1952   R INGHINAL   INGUINAL HERNIA REPAIR Left 04/07/2018   Procedure: OPERN LEFT INGUINAL HERNIA REPAIR WITH MESH ERAS PATHWAY;  Surgeon: Tanda Locus, MD;  Location: [facility] ORS;  Service: General;  Laterality: Left;   LOOP RECORDER INSERTION N/A 09/17/2023   Procedure: LOOP RECORDER INSERTION;  Surgeon: Lesia Ozell Barter, PA-C;  Location: [facility] INVASIVE CV LAB;  Service: Cardiovascular;  Laterality: N/A;   PENILE PROSTHESIS IMPLANT  03/09/2009   DAHLSTEDT   PROSTATE SURGERY  03/09/2001   PROSTRATECTOMY   TRANSESOPHAGEAL ECHOCARDIOGRAM (CATH LAB) N/A 12/13/2023   Procedure: TRANSESOPHAGEAL ECHOCARDIOGRAM;  Surgeon: Floretta Mallard, MD;  Location: [facility] INVASIVE CV LAB;  Service: Cardiovascular;   Laterality: N/A;   Patient Active Problem List   Diagnosis Date Noted   Coronary artery disease involving native coronary artery of native heart without angina pectoris 12/03/2023   History of CVA (cerebrovascular accident) 11/06/2023   Cognitive impairment 09/12/2023   Right lower lobe pulmonary nodule 09/08/2023   Attention deficit hyperactivity disorder (ADHD), predominantly hyperactive type 06/03/2021   Cherry angioma 06/03/2021   Seborrheic keratosis 06/03/2021   Arthritis 03/17/2018   Spinal stenosis of lumbosacral region 03/17/2018   Actinic keratosis 03/17/2018   Degeneration of lumbar intervertebral disc 10/27/2017   History of prostate cancer 09/15/2011   Fear of flying 09/15/2011   Family history of heart disease in male family member before age 36 09/15/2011   Allergic rhinitis 09/15/2011   Hemorrhoids 09/15/2011   Hypertension 06/27/2010   Dyslipidemia    LVH (left ventricular hypertrophy)     ONSET DATE: late June 2025  REFERRING DIAG: Z86.73, Z92.89, H54.7  THERAPY DIAG:  Hemianopia, homonymous, left  Visuospatial deficit  Attention and concentration deficit  Other lack of coordination  Rationale for Evaluation and Treatment: Rehabilitation  SUBJECTIVE:   SUBJECTIVE STATEMENT: 01/13/24: No changes from yesterday 01/12/24: I got prism  sunglasses last Thursday at [facility] because I forgot my regular glasses, but I got the prescription to take my regular glasses to an optician.  I drove  a golf cart yesterday on the golf course. I also took the bus yesterday and did ok. I had a stumble but didn't fall over the sidewalk. No pain today but sore from golf yesterday.   Pt accompanied by: self and spouse for end of session  PERTINENT HISTORY: CVA 09/08/23, hx of falls  PRECAUTIONS: None  WEIGHT BEARING RESTRICTIONS: No  PAIN:  Are you having pain? No  FALLS: Has patient fallen in last 6 months? Yes. Number of falls unclear, but fell a couple weeks ago Not  paying attention and has had stumbles but not falls.  LIVING ENVIRONMENT: Lives with: lives with their spouse Lives in: House/apartment Stairs: Yes: Internal: unclear but has a rail for internal steps steps; steps to enter home with rail and rail doing downstairs Has following equipment at home: Single point cane  PLOF: Independent, Independent with basic ADLs, Independent with household mobility without device, and Independent with transfers  PATIENT GOALS: Pt would like to be able use his computer again for email, typing in word, etc. Would also love to be able to golf, play pickle ball etc. Ultimate goal is to drive.  OBJECTIVE:  Note: Objective measures were completed at Evaluation unless otherwise noted.  HAND DOMINANCE: Left  ADLs: Overall ADL: Mod I at this time, using a SPC for mobility.   IADLs: Overall pt is not driving and has assist for grocery shopping, bills, cooking, IADLs at this time.  Handwriting: 90% legible  MOBILITY STATUS: Hx of falls and needs SPC  POSTURE COMMENTS:  rounded shoulders Sitting balance: decreased core strength noted but overall demonstrates functional sitting balance.   ACTIVITY TOLERANCE: Activity tolerance: decreased but functional for ADL tasks, not able to do IADL tasks such as golf and pickleball  FUNCTIONAL OUTCOME MEASURES: NA - pt truly does not feel like he has LUE deficits from CVA and wants to work on vision  UPPER EXTREMITY ROM and MMT:     Active ROM Right eval Left eval  Shoulder flexion 150 WFL  (Blank rows = not tested)  MMT - Note mild deficits in B shoulders for shoulder flexion 2/2 old injuries from falls   HAND FUNCTION: WFL - note pt verbose and arrived late to evaluation, did not have time to asses grip strength as pt wanted to focus on vision. Will plan to assess in future sessions.   COORDINATION: Finger Nose Finger test: dysmetria and decreased coordination noted but unclear if this is from visual  deficits to aim for other hand  11/25/23: 9 hole peg test: RT = 49.58 sec (slower d/t missing far Lt side and difficulty following directions even after multiple cues) Lt = 44.70 (difficulty following directions)   SENSATION: WFL  EDEMA: none  MUSCLE TONE: RUE: Within functional limits and LUE: Within functional limits  COGNITION: Overall cognitive status: Impaired and Difficulty to assess due to: Note pt does not think deficits are significant but statements from S.O. in room states that cognition has been limited for comprehension, short term memory, and higher level tasks.   VISION: Subjective report: Pt reports he cannot see anything on the left - like its not even there. States he has learned various scanning and compensatory techniques.  Baseline vision: no glassess but demonstrates hemianopsia  Visual history: none prior to CVA   VISION ASSESSMENT: Impaired To be further assessed in functional context Eye alignment: Impaired:   Tracking/Visual pursuits: Decreased smoothness with horizontal tracking, Decreased smoothness with vertical tracking, Decreased smoothness of eye movement  to Left superior field, Decreased smoothness of eye movement to Left inferior field, and Requires cues, head turns, or add eye shifts to track Visual Fields: Left homonymous hemianopsia  Patient has difficulty with following activities due to following visual impairments: driving, cooking, pickleball, golfing, has ad recent falls.   PERCEPTION: Impaired: Inattention/neglect: does not attend to left visual field  PRAXIS: Not tested  OBSERVATIONS: The pt is a 73 yo male who is accompanied by his S.O. this date walking with a SPC who demonstrates dignificant visual deficits post CVA. Pt demonstrates slight deficits in BUE shoulder ROM but is fairly symmetrical and good MMT - does not feel like strength has been impacted from CVA from a neuro stand point. Pt with significant visual field deficits, L  hemianopsia, and decreased attention to L visual field impacting skill performance.                                                                                                                             TREATMENT:    Pt arrived w/ Lt eyelid taped however tape was coming off. Pt reports he wore it last night. Therapist advised pt NOT to wear tape at night, but only during day prn. Pt also just pulled tape off w/o protecting the eyelid - pt instructed to hold eyelid w/ other hand, while gently taking off tape.   Pt copying block designs using 1 colored blocks - pt able to copy simple designs w/ mild difficulty. Pt given 4 trials getting increasingly slightly harder (but blocks still delineated in pictures) Progressed to copying large parquetry wooden block design - pt required mod cues to distinguish b/t triangles and parallelograms and also cues to develop strategies (getting all pieces out of box prior to making design). Pt also required cues to look far Lt to see all pieces. Pt has improved with correctly angling/oriented pieces during activity today  Pt played kid aged dominoes w/ initial cues but then played very well w/ only min cues to scan fully to Lt side  PATIENT EDUCATION: Education details: see above Person educated: Patient  Education method: Explanation, Demonstration, and Verbal cues Education comprehension: verbalized understanding, returned demonstration, verbal cues required, and needs further education  HOME EXERCISE PROGRAM: 11/22/23:  Visual HEP and compensation strategies  11/25/23: coordination HEP  12/20/23: Golf Solitaire  GOALS: Goals reviewed with patient? Yes  SHORT TERM GOALS: Target date: 12/08/23  Pt will be Supervision with vision specific HEP to further integrate strategies and scanning into daily life for max recovery and benefit.  Baseline: Pt does incooperate techniques from hospital but would benefit from further training  Goal status: MET  2.  Pt  will demonstrate improved visual scanning to locate 5 items in a room with no more than 2 verbal cues to locate items.  Baseline:  5 items, needing 3+ cues for scanning Goal status: MET in min distracting environment.  12/06/23  3.  Pt will demonstrate improved  visual acuity and/or use of adaptations to use his personal computer for leisure activities such as email, typing in Word, etc with no more than Supervision.  Baseline: Pt reports significant difficulty with this Goal status: PARTIALLY MET w/ occasional cues (12/09/23 - Needed mod cueing to send email, use word, etc) 12/23/2023 - using phone more than computer  4.  Pt will perform a visual scanning task/worksheet of choice with no more than 2 errors in order to demonstrate improved scanning and acuity.  Goal status: Ongoing  12/06/23 1.50M scanning sheet with 7 errors (93%)  12/22/2023: 1.50M double cancellation sheet (91% accuracy)  LONG TERM GOALS: Target date: 01/05/24  Pt will be MOD I with vision specific HEP to further integrate strategies and scanning into daily life for max recovery and benefit.  Baseline: Pt does incooperate techniques from hospital but would benefit from further training  Goal status: IN Progress  2.  Pt will demonstrate improved visual scanning to locate 5 items in a room with no verbal cues to locate items.  Baseline:  5 items, needing 3+ cues for scanning Goal status: MET  3.  Pt will demonstrate improved visual acuity and/or use of adaptations to use his personal computer for leisure activities such as email, typing in Word, etc with Independence. Baseline: Pt reports significant difficulty with this Goal status: IN PROGRESS (have practiced in clinic but continues to need some assist)   4.  If safe and appropriate, pt will report a return to some leisure activities such as modified golf, pickle ball, walking, etc with use of strategies, AE, or compensatory techniques to improve safety and participation.   Baseline: not safe and not performing at this time 12/22/2023: pt able to walk to therapy appointment today Goal status: IN PROGRESS (pt has returned to all but pickle ball d/t vision and balance)  5.  Pt will perform environmental scanning in moderately distracting gym consistently at 80% accuracy  Baseline: 01/03/24 = 80% accuracy down quiet hallway only with extra time Goal status: INITIAL   ASSESSMENT:  CLINICAL IMPRESSION: Pt has met 3/4 STG's and 1 LTG.  Pt continues to require cues to look to Lt side. Pt also continues to demo deficits in visual/perceptual skills however has demo slight improvement in turning/angling pieces better today with parquetry designs. Pt would continue to benefit from skilled O.T. to address these remaining goals, improve safety, and maximize function.   PERFORMANCE DEFICITS: in functional skills including ADLs, IADLs, proprioception, endurance, and vision, cognitive skills including attention, memory, problem solving, safety awareness, and sequencing, and psychosocial skills including coping strategies, environmental adaptation, habits, and routines and behaviors.   IMPAIRMENTS: are limiting patient from ADLs, IADLs, leisure, and social participation.   CO-MORBIDITIES: has no other co-morbidities that affects occupational performance. Patient will benefit from skilled OT to address above impairments and improve overall function.  REHAB POTENTIAL: Good  PLAN:  OT FREQUENCY: 2x/week  OT DURATION: 4 additional weeks (through end of November)  PLANNED INTERVENTIONS: 02831 OT Re-evaluation, 97535 self care/ADL training, 02889 therapeutic exercise, 97530 therapeutic activity, and 97112 neuromuscular re-education  RECOMMENDED OTHER SERVICES: NA  CONSULTED AND AGREED WITH PLAN OF CARE: Patient and family member/caregiver  PLAN FOR NEXT SESSION:  More typing games for scanning/typing skills, consider blaze pods,  modified Londa, visual memory (matching  cards - limit to 10 cards/5 matches, etc), dice game  Burnard JINNY Roads, OTR/L 01/13/2024, 9:36 AM

## 2024-01-17 ENCOUNTER — Ambulatory Visit: Admitting: Occupational Therapy

## 2024-01-17 NOTE — Progress Notes (Unsigned)
 Cardiology Office Note:   Date:  01/20/2024  ID:  Terry Hays, DOB Sep 01, 1950, MRN [id number] PCP:  Terry Norleen BROCKS, MD  [facility] Providers Cardiologist:  Wendel Haws, MD Referring MD: Terry Norleen BROCKS, MD  Chief Complaint/Reason for Referral:  PFO/CVA ASSESSMENT:    1. History of CVA (cerebrovascular accident)   2. PFO (patent foramen ovale)   3. Coronary artery disease involving native coronary artery of native heart without angina pectoris   4. Hyperlipidemia LDL goal <55   5. Essential hypertension   6. CKD (chronic kidney disease) stage 2, GFR 60-89 ml/min     PLAN:   In order of problems listed above: Stroke: The patient has a Linq monitor in place.  A TEE demonstrated a large shunt associated with a PFO.  Recent data of the last few years has suggested that PFO closure in patients over the age of 73 is beneficial (PMID [id number]).  Will have patient follow-up with neurology.  This will give us  more time to monitor the patient for arrhythmia and a particular atrial fibrillation.  For now continue aspirin  81 mg, atorvastatin  80 mg.  For now I will see the patient back in 6 months.  The patient will be seeing Dr. [name] in January.  Neurology opinion will be important in regards to whether PFO closure should be pursued.  Of note he does have relatively high risk anatomy and carries that Pascal classification of possible. PFO: See discussion above. CAD: Continue aspirin  81 mg, atorvastatin  80 mg Hyperlipidemia: Continue atorvastatin  80 mg.  Check lipid panel and LFTs today.  LDL in July was 71.  Goal LDL with history of stroke is less than 55. Hypertension: Continue amlodipine  5 mg, losartan  50 mg.  BP well-controlled today. CKD stage II: Continue losartan  50 mg            Dispo:  Return in about 6 months (around 07/19/2024).       I spent 52 minutes reviewing all clinical data during and prior to this visit including all relevant imaging studies, laboratories,  clinical information from other health systems and prior notes from both Cardiology and other specialties, interviewing the patient, conducting a complete physical examination, and coordinating care in order to formulate a comprehensive and personalized evaluation and treatment plan.   History of Present Illness:    FOCUSED PROBLEM LIST:   Left-sided vision loss July 2025 No acute abnormality head CT Moderate to large acute/subacute infarction right PCA territory MRI Occlusion of P2P right PCA head and neck CTA No AF  (Linq monitor implanted) PFO with large shunting TEE ROPE score 3; PASCAL possible Coronary artery calcification Chest CT 2025 Hyperlipidemia Hypertension CKD stage II BMI 04 February 2023:  Patient consents to use of AI scribe. The patient is a 73 year old male with the above listed medical problems referred for recommendations regarding a history of stroke with a large PFO.  In July, he experienced vision loss on the left side, leading to the diagnosis of a stroke. He continues to have this visual defect and uses a cane to aid in mobility due to issues with proprioception related to his vision. Following the stroke, a loop monitor was placed to investigate the cause, and he was started on medications. An ultrasound revealed a communication in the heart, and the patient recalls being told there may be a hole in his heart.  No difficulty moving arms and legs, slurred speech, palpitations, or chest pain. Occasional short-term memory loss, which  he attributes to long-term use of Lipitor.  He is actively engaged in occupational and physical therapy, focusing on eye exercises and general physical fitness, including daily exercises and walking a couple of miles a day.  He is currently on aspirin  81 mg and a stronger dose of Lipitor, which was started about a month ago.     Current Medications: No outpatient medications have been marked as taking for the 01/20/24 encounter  (Office Visit) with Traver Meckes K, MD.     Review of Systems:   Please see the history of present illness.    All other systems reviewed and are negative.     EKGs/Labs/Other Test Reviewed:   EKG: 2025 sinus bradycardia with PACs  EKG Interpretation Date/Time:    Ventricular Rate:    PR Interval:    QRS Duration:    QT Interval:    QTC Calculation:   R Axis:      Text Interpretation:          CARDIAC STUDIES: Refer to CV Procedures and Imaging Tabs   Risk Assessment/Calculations:          Physical Exam:   VS:  BP 130/68 (BP Location: Left Arm, Patient Position: Sitting, Cuff Size: Large)   Pulse 64   Resp 16   Ht 5' 8 (1.727 m)   Wt 184 lb 9.6 oz (83.7 kg)   SpO2 97%   BMI 28.07 kg/m        Wt Readings from Last 3 Encounters:  01/20/24 184 lb 9.6 oz (83.7 kg)  12/22/23 187 lb 9.6 oz (85.1 kg)  12/13/23 182 lb (82.6 kg)      GENERAL:  No apparent distress, AOx3 HEENT:  No carotid bruits, +2 carotid impulses, no scleral icterus CAR: RRR no murmurs, gallops, rubs, or thrills RES:  Clear to auscultation bilaterally ABD:  Soft, nontender, nondistended, positive bowel sounds x 4 VASC:  +2 radial pulses, +2 carotid pulses NEURO:  CN 2-12 grossly intact; motor and sensory grossly intact PSYCH:  No active depression or anxiety EXT:  No edema, ecchymosis, or cyanosis  Signed, Terry Winters K Hermenia Fritcher, MD  01/20/2024 9:44 AM    [facility] [address] [place], [place], [place]  [address] Phone: [phone]; Fax: [phone]   Note:  This document was prepared using Dragon voice recognition software and may include unintentional dictation errors.

## 2024-01-19 ENCOUNTER — Encounter: Payer: Self-pay | Admitting: Occupational Therapy

## 2024-01-19 ENCOUNTER — Ambulatory Visit: Admitting: Occupational Therapy

## 2024-01-19 ENCOUNTER — Ambulatory Visit: Admitting: Physical Therapy

## 2024-01-19 VITALS — BP 136/71 | HR 64

## 2024-01-19 DIAGNOSIS — R2689 Other abnormalities of gait and mobility: Secondary | ICD-10-CM | POA: Diagnosis not present

## 2024-01-19 DIAGNOSIS — H53462 Homonymous bilateral field defects, left side: Secondary | ICD-10-CM

## 2024-01-19 DIAGNOSIS — R278 Other lack of coordination: Secondary | ICD-10-CM | POA: Diagnosis not present

## 2024-01-19 DIAGNOSIS — R2681 Unsteadiness on feet: Secondary | ICD-10-CM | POA: Diagnosis not present

## 2024-01-19 DIAGNOSIS — M6281 Muscle weakness (generalized): Secondary | ICD-10-CM

## 2024-01-19 DIAGNOSIS — R41840 Attention and concentration deficit: Secondary | ICD-10-CM

## 2024-01-19 DIAGNOSIS — R41844 Frontal lobe and executive function deficit: Secondary | ICD-10-CM | POA: Diagnosis not present

## 2024-01-19 DIAGNOSIS — R41842 Visuospatial deficit: Secondary | ICD-10-CM | POA: Diagnosis not present

## 2024-01-19 NOTE — Therapy (Signed)
 OUTPATIENT OCCUPATIONAL THERAPY NEURO TREATMENT   Patient Name: Terry Hays MRN: [id number] DOB:Aug 13, 1950, 73 y.o., male Today's Date: 01/19/2024  PCP: [name], MD REFERRING PROVIDER: Bulah Alm RAMAN, PA-C  END OF SESSION:  OT End of Session - 01/19/24 0937     Visit Number 17    Number of Visits 22    Date for Recertification  02/05/24    Authorization Type [employer] P[employer]--17 visits approved 11/10/23-01/05/24, approved 8 visits from 01/05/24 - 04/04/24    Authorization - Visit Number 4    Authorization - Number of Visits 9    Progress Note Due on Visit 20    OT Start Time 0930    OT Stop Time 1015    OT Time Calculation (min) 45 min    Activity Tolerance Patient tolerated treatment well    Behavior During Therapy [facility] for tasks assessed/performed         Past Medical History:  Diagnosis Date   Abnormal ECG    Allergy    Anxiety    FLYING   Diverticulosis    Dyslipidemia    History of prostate cancer    Hyperlipidemia    Hypertension    LVH (left ventricular hypertrophy)    Pre-diabetes    Past Surgical History:  Procedure Laterality Date   COLONOSCOPY  2004 2011   MAGOD   EYE SURGERY  03/10/1999   CATOACTS BOTH   HERNIA REPAIR  03/09/1952   R INGHINAL   INGUINAL HERNIA REPAIR Left 04/07/2018   Procedure: OPERN LEFT INGUINAL HERNIA REPAIR WITH MESH ERAS PATHWAY;  Surgeon: Tanda Locus, MD;  Location: [facility];  Service: General;  Laterality: Left;   LOOP RECORDER INSERTION N/A 09/17/2023   Procedure: LOOP RECORDER INSERTION;  Surgeon: Lesia Ozell Barter, PA-C;  Location: [facility] INVASIVE CV LAB;  Service: Cardiovascular;  Laterality: N/A;   PENILE PROSTHESIS IMPLANT  03/09/2009   DAHLSTEDT   PROSTATE SURGERY  03/09/2001   PROSTRATECTOMY   TRANSESOPHAGEAL ECHOCARDIOGRAM (CATH LAB) N/A 12/13/2023   Procedure: TRANSESOPHAGEAL ECHOCARDIOGRAM;  Surgeon: Floretta Mallard, MD;  Location: [facility] INVASIVE CV LAB;  Service: Cardiovascular;   Laterality: N/A;   Patient Active Problem List   Diagnosis Date Noted   Coronary artery disease involving native coronary artery of native heart without angina pectoris 12/03/2023   History of CVA (cerebrovascular accident) 11/06/2023   Cognitive impairment 09/12/2023   Right lower lobe pulmonary nodule 09/08/2023   Attention deficit hyperactivity disorder (ADHD), predominantly hyperactive type 06/03/2021   Cherry angioma 06/03/2021   Seborrheic keratosis 06/03/2021   Arthritis 03/17/2018   Spinal stenosis of lumbosacral region 03/17/2018   Actinic keratosis 03/17/2018   Degeneration of lumbar intervertebral disc 10/27/2017   History of prostate cancer 09/15/2011   Fear of flying 09/15/2011   Family history of heart disease in male family member before age 55 09/15/2011   Allergic rhinitis 09/15/2011   Hemorrhoids 09/15/2011   Hypertension 06/27/2010   Dyslipidemia    LVH (left ventricular hypertrophy)     ONSET DATE: late June 2025  REFERRING DIAG: Z86.73, Z92.89, H54.7  THERAPY DIAG:  Hemianopia, homonymous, left  Visuospatial deficit  Attention and concentration deficit  Other lack of coordination  Frontal lobe and executive function deficit  Rationale for Evaluation and Treatment: Rehabilitation  SUBJECTIVE:   SUBJECTIVE STATEMENT: I got the prisms in my regular glasses (pt arrived w/ glasses) - they help but don't solve everything.   Pt accompanied by: self and spouse for end  of session  PERTINENT HISTORY: CVA 09/08/23, hx of falls  PRECAUTIONS: None  WEIGHT BEARING RESTRICTIONS: No  PAIN:  Are you having pain? No  FALLS: Has patient fallen in last 6 months? Yes. Number of falls unclear, but fell a couple weeks ago Not paying attention and has had stumbles but not falls.  LIVING ENVIRONMENT: Lives with: lives with their spouse Lives in: House/apartment Stairs: Yes: Internal: unclear but has a rail for internal steps steps; steps to enter home with  rail and rail doing downstairs Has following equipment at home: Single point cane  PLOF: Independent, Independent with basic ADLs, Independent with household mobility without device, and Independent with transfers  PATIENT GOALS: Pt would like to be able use his computer again for email, typing in word, etc. Would also love to be able to golf, play pickle ball etc. Ultimate goal is to drive.  OBJECTIVE:  Note: Objective measures were completed at Evaluation unless otherwise noted.  HAND DOMINANCE: Left  ADLs: Overall ADL: Mod I at this time, using a SPC for mobility.   IADLs: Overall pt is not driving and has assist for grocery shopping, bills, cooking, IADLs at this time.  Handwriting: 90% legible  MOBILITY STATUS: Hx of falls and needs SPC  POSTURE COMMENTS:  rounded shoulders Sitting balance: decreased core strength noted but overall demonstrates functional sitting balance.   ACTIVITY TOLERANCE: Activity tolerance: decreased but functional for ADL tasks, not able to do IADL tasks such as golf and pickleball  FUNCTIONAL OUTCOME MEASURES: NA - pt truly does not feel like he has LUE deficits from CVA and wants to work on vision  UPPER EXTREMITY ROM and MMT:     Active ROM Right eval Left eval  Shoulder flexion 150 WFL  (Blank rows = not tested)  MMT - Note mild deficits in B shoulders for shoulder flexion 2/2 old injuries from falls   HAND FUNCTION: WFL - note pt verbose and arrived late to evaluation, did not have time to asses grip strength as pt wanted to focus on vision. Will plan to assess in future sessions.   COORDINATION: Finger Nose Finger test: dysmetria and decreased coordination noted but unclear if this is from visual deficits to aim for other hand  11/25/23: 9 hole peg test: RT = 49.58 sec (slower d/t missing far Lt side and difficulty following directions even after multiple cues) Lt = 44.70 (difficulty following directions)   SENSATION: WFL  EDEMA:  none  MUSCLE TONE: RUE: Within functional limits and LUE: Within functional limits  COGNITION: Overall cognitive status: Impaired and Difficulty to assess due to: Note pt does not think deficits are significant but statements from S.O. in room states that cognition has been limited for comprehension, short term memory, and higher level tasks.   VISION: Subjective report: Pt reports he cannot see anything on the left - like its not even there. States he has learned various scanning and compensatory techniques.  Baseline vision: no glassess but demonstrates hemianopsia  Visual history: none prior to CVA   VISION ASSESSMENT: Impaired To be further assessed in functional context Eye alignment: Impaired:   Tracking/Visual pursuits: Decreased smoothness with horizontal tracking, Decreased smoothness with vertical tracking, Decreased smoothness of eye movement to Left superior field, Decreased smoothness of eye movement to Left inferior field, and Requires cues, head turns, or add eye shifts to track Visual Fields: Left homonymous hemianopsia  Patient has difficulty with following activities due to following visual impairments: driving, cooking, pickleball, golfing,  has ad recent falls.   PERCEPTION: Impaired: Inattention/neglect: does not attend to left visual field  PRAXIS: Not tested  OBSERVATIONS: The pt is a 73 yo male who is accompanied by his S.O. this date walking with a SPC who demonstrates dignificant visual deficits post CVA. Pt demonstrates slight deficits in BUE shoulder ROM but is fairly symmetrical and good MMT - does not feel like strength has been impacted from CVA from a neuro stand point. Pt with significant visual field deficits, L hemianopsia, and decreased attention to L visual field impacting skill performance.                                                                                                                             TREATMENT:    01/19/24:  Dice game  for coordination, visual scanning, and cognition - pt required min cues initially and often did not realize he was pushing additional tiles down w/ targeted tile(s). Pt did better developing strategy w/ less rolls needed 2nd trial. However, required more rolls 3rd trial and occasional cues  Played memory game (5 pairs/matches of 5) 2 rows w/ min difficulty and cues to keep cards in same place for visual memory. 3rd trial added 6 pairs/matches of 6 (3 rows) w/ max difficulty and max cues needed.   01/13/24:  Pt arrived w/ Lt eyelid taped however tape was coming off. Pt reports he wore it last night. Therapist advised pt NOT to wear tape at night, but only during day prn. Pt also just pulled tape off w/o protecting the eyelid - pt instructed to hold eyelid w/ other hand, while gently taking off tape.   Pt copying block designs using 1 colored blocks - pt able to copy simple designs w/ mild difficulty. Pt given 4 trials getting increasingly slightly harder (but blocks still delineated in pictures) Progressed to copying large parquetry wooden block design - pt required mod cues to distinguish b/t triangles and parallelograms and also cues to develop strategies (getting all pieces out of box prior to making design). Pt also required cues to look far Lt to see all pieces. Pt has improved with correctly angling/oriented pieces during activity today  Pt played kid aged dominoes w/ initial cues but then played very well w/ only min cues to scan fully to Lt side  PATIENT EDUCATION: Education details: see above Person educated: Patient  Education method: Explanation, Demonstration, and Verbal cues Education comprehension: verbalized understanding, returned demonstration, verbal cues required, and needs further education  HOME EXERCISE PROGRAM: 11/22/23:  Visual HEP and compensation strategies  11/25/23: coordination HEP  12/20/23: Golf Solitaire  GOALS: Goals reviewed with patient? Yes  SHORT TERM  GOALS: Target date: 12/08/23  Pt will be Supervision with vision specific HEP to further integrate strategies and scanning into daily life for max recovery and benefit.  Baseline: Pt does incooperate techniques from hospital but would benefit from further training  Goal status: MET  2.  Pt will demonstrate improved visual scanning to locate 5 items in a room with no more than 2 verbal cues to locate items.  Baseline:  5 items, needing 3+ cues for scanning Goal status: MET in min distracting environment.  12/06/23  3.  Pt will demonstrate improved visual acuity and/or use of adaptations to use his personal computer for leisure activities such as email, typing in Word, etc with no more than Supervision.  Baseline: Pt reports significant difficulty with this Goal status: PARTIALLY MET w/ occasional cues (12/09/23 - Needed mod cueing to send email, use word, etc) 12/23/2023 - using phone more than computer  4.  Pt will perform a visual scanning task/worksheet of choice with no more than 2 errors in order to demonstrate improved scanning and acuity.  Goal status: Ongoing  12/06/23 1.18M scanning sheet with 7 errors (93%)  12/22/2023: 1.18M double cancellation sheet (91% accuracy)  LONG TERM GOALS: Target date: 01/05/24  Pt will be MOD I with vision specific HEP to further integrate strategies and scanning into daily life for max recovery and benefit.  Baseline: Pt does incooperate techniques from hospital but would benefit from further training  Goal status: IN Progress  2.  Pt will demonstrate improved visual scanning to locate 5 items in a room with no verbal cues to locate items.  Baseline:  5 items, needing 3+ cues for scanning Goal status: MET  3.  Pt will demonstrate improved visual acuity and/or use of adaptations to use his personal computer for leisure activities such as email, typing in Word, etc with Independence. Baseline: Pt reports significant difficulty with this Goal status: IN  PROGRESS (have practiced in clinic but continues to need some assist)   4.  If safe and appropriate, pt will report a return to some leisure activities such as modified golf, pickle ball, walking, etc with use of strategies, AE, or compensatory techniques to improve safety and participation.  Baseline: not safe and not performing at this time 12/22/2023: pt able to walk to therapy appointment today Goal status: IN PROGRESS (pt has returned to all but pickle ball d/t vision and balance)  5.  Pt will perform environmental scanning in moderately distracting gym consistently at 80% accuracy  Baseline: 01/03/24 = 80% accuracy down quiet hallway only with extra time Goal status: INITIAL   ASSESSMENT:  CLINICAL IMPRESSION: Pt has met 3/4 STG's and 1 LTG.  Pt continues to require cues to look to Lt side. Pt also continues to demo deficits in visual/perceptual skills and visual memory. Pt would continue to benefit from skilled O.T. to address these remaining goals, improve safety, and maximize function.   PERFORMANCE DEFICITS: in functional skills including ADLs, IADLs, proprioception, endurance, and vision, cognitive skills including attention, memory, problem solving, safety awareness, and sequencing, and psychosocial skills including coping strategies, environmental adaptation, habits, and routines and behaviors.   IMPAIRMENTS: are limiting patient from ADLs, IADLs, leisure, and social participation.   CO-MORBIDITIES: has no other co-morbidities that affects occupational performance. Patient will benefit from skilled OT to address above impairments and improve overall function.  REHAB POTENTIAL: Good  PLAN:  OT FREQUENCY: 2x/week  OT DURATION: 4 additional weeks (through end of November)  PLANNED INTERVENTIONS: 02831 OT Re-evaluation, 97535 self care/ADL training, 02889 therapeutic exercise, 97530 therapeutic activity, and 97112 neuromuscular re-education  RECOMMENDED OTHER SERVICES:  NA  CONSULTED AND AGREED WITH PLAN OF CARE: Patient and family member/caregiver  PLAN FOR NEXT SESSION:  More typing games for scanning/typing skills, consider  blaze pods,  modified Londa Burnard JINNY Abelardo, OTR/L 01/19/2024, 12:07 PM

## 2024-01-19 NOTE — Therapy (Signed)
 OUTPATIENT PHYSICAL THERAPY NEURO TREATMENT NOTE   Patient Name: Terry Hays MRN: [id number] DOB:07-28-1950, 73 y.o., male Today's Date: 01/19/2024   PCP: [name], MD  REFERRING PROVIDER: Bulah Alm RAMAN, PA-C   END OF SESSION:  PT End of Session - 01/19/24 1509     Visit Number 9    Number of Visits 12    Date for Recertification  02/11/24   from re-cert   Authorization Type [employer]-submitted auth at eval    Authorization Time Period 4 PT visits 10/13-1/11/26    Progress Note Due on Visit 10    PT Start Time 0850    PT Stop Time 0931    PT Time Calculation (min) 41 min    Equipment Utilized During Treatment Gait belt    Activity Tolerance Patient tolerated treatment well    Behavior During Therapy [facility] for tasks assessed/performed             Past Medical History:  Diagnosis Date   Abnormal ECG    Allergy    Anxiety    FLYING   Diverticulosis    Dyslipidemia    History of prostate cancer    Hyperlipidemia    Hypertension    LVH (left ventricular hypertrophy)    Pre-diabetes    Past Surgical History:  Procedure Laterality Date   COLONOSCOPY  2004 2011   MAGOD   EYE SURGERY  03/10/1999   CATOACTS BOTH   HERNIA REPAIR  03/09/1952   R INGHINAL   INGUINAL HERNIA REPAIR Left 04/07/2018   Procedure: OPERN LEFT INGUINAL HERNIA REPAIR WITH MESH ERAS PATHWAY;  Surgeon: Tanda Locus, MD;  Location: [facility];  Service: General;  Laterality: Left;   LOOP RECORDER INSERTION N/A 09/17/2023   Procedure: LOOP RECORDER INSERTION;  Surgeon: Lesia Ozell Barter, PA-C;  Location: [facility] INVASIVE CV LAB;  Service: Cardiovascular;  Laterality: N/A;   PENILE PROSTHESIS IMPLANT  03/09/2009   DAHLSTEDT   PROSTATE SURGERY  03/09/2001   PROSTRATECTOMY   TRANSESOPHAGEAL ECHOCARDIOGRAM (CATH LAB) N/A 12/13/2023   Procedure: TRANSESOPHAGEAL ECHOCARDIOGRAM;  Surgeon: Floretta Mallard, MD;  Location: [facility] INVASIVE CV LAB;  Service: Cardiovascular;  Laterality: N/A;    Patient Active Problem List   Diagnosis Date Noted   Coronary artery disease involving native coronary artery of native heart without angina pectoris 12/03/2023   History of CVA (cerebrovascular accident) 11/06/2023   Cognitive impairment 09/12/2023   Right lower lobe pulmonary nodule 09/08/2023   Attention deficit hyperactivity disorder (ADHD), predominantly hyperactive type 06/03/2021   Cherry angioma 06/03/2021   Seborrheic keratosis 06/03/2021   Arthritis 03/17/2018   Spinal stenosis of lumbosacral region 03/17/2018   Actinic keratosis 03/17/2018   Degeneration of lumbar intervertebral disc 10/27/2017   History of prostate cancer 09/15/2011   Fear of flying 09/15/2011   Family history of heart disease in male family member before age 75 09/15/2011   Allergic rhinitis 09/15/2011   Hemorrhoids 09/15/2011   Hypertension 06/27/2010   Dyslipidemia    LVH (left ventricular hypertrophy)     ONSET DATE: 10/20/2023 (MD referral)  REFERRING DIAG:  Z86.73 (ICD-10-CM) - History of stroke  H54.7 (ICD-10-CM) - Vision loss  Z92.89 (ICD-10-CM) - History of recent hospitalization    THERAPY DIAG:  Muscle weakness (generalized)  Unsteadiness on feet  Other lack of coordination  Hemianopia, homonymous, left  Rationale for Evaluation and Treatment: Rehabilitation  SUBJECTIVE:  SUBJECTIVE STATEMENT:  Pt enters clinic today ambulating with SPC. He got his prism  glasses and are wearing them today. He states he is running into less things on his left side and says it seems to be helping. Patient denies falls.  Pt accompanied by: self  PERTINENT HISTORY: PMH prior CVAs (unknown to patient), ADHD, HTN, HLD, prostate cancer, diverticulosis who presented to the ER with confusion and left visual field  changes (09/2023)  PAIN:  Are you having pain? No  PRECAUTIONS: Fall and Other: decreased peripheral vision from CVA   Avoid driving  RED FLAGS: None   WEIGHT BEARING RESTRICTIONS: No  FALLS: Has patient fallen in last 6 months? Yes. Number of falls 1-R knee gave way  LIVING ENVIRONMENT: Lives with: lives with their spouse Lives in: House/apartment Stairs: steps to enter with rails; upstairs and downstairs with rail Has following equipment at home: Single point cane  PLOF: Independent and Leisure: played pickleball and golf; was working out for core strengthening and walking several miles everyday  PATIENT GOALS: To get back to playing pickleball [facility]) and golf  OBJECTIVE:  Note: Objective measures were completed at Evaluation unless otherwise noted.  DIAGNOSTIC FINDINGS: acute CVA of the right posterior cerebral artery,   COGNITION: Overall cognitive status: Within functional limits for tasks assessed   SENSATION: Light touch: WFL  COORDINATION: WFL for bilat alt taps   MUSCLE TONE: WFL  POSTURE: rounded shoulders  LOWER EXTREMITY ROM:   WFL BLEs   LOWER EXTREMITY MMT:    MMT Right Eval Left Eval  Hip flexion 4 4+  Hip extension    Hip abduction 5 5  Hip adduction 5 5  Hip internal rotation    Hip external rotation    Knee flexion 5 5  Knee extension 4 4  Ankle dorsiflexion 4 4  Ankle plantarflexion    Ankle inversion    Ankle eversion    (Blank rows = not tested)  TRANSFERS: Sit to stand: Modified independence  Assistive device utilized: None     Stand to sit: Modified independence  Assistive device utilized: None      GAIT: Findings: Gait Characteristics: veers to R, step through pattern, and wide BOS, Distance walked: 50 ft, Assistive device utilized:Single point cane, and Level of assistance: SBA  FUNCTIONAL TESTS:  5 times sit to stand: 13.91 sec Timed up and go (TUG): NT 10 meter walk test: 13.41 veers to R= 2.44 ft/sec FGA=  16/30  M-CTSIB  Condition 1: Firm Surface, EO 30 Sec, Normal Sway  Condition 2: Firm Surface, EC 30 Sec, Normal Sway  Condition 3: Foam Surface, EO 30 Sec, Mild Sway  Condition 4: Foam Surface, EC 30 Sec, Mild and Moderate Sway                                                                                                                                TREATMENT DATE: 01-19-24  NMR:  On foam at steps: Toe taps: 6 blaze pods, 4 on left side of mirror and 4 at 2 level steps 4 rounds, 1 minute each of random color hits with 5 distracting colors Improved balance and finding of left sided blazepods with repetitions Min assist needed throughout due to minor loss of balance especially with L SLS Multidirectional stepping off of foam in relation to blazepod light up 3 rounds, each 2 minutes 4 pods (2 high and 2 low placed behind bar for visual challenge) Improved stepping with repetitions Min assist needed throughout due to minor loss of balance especially with L SLS  Balance beam tandem walking and side stepping Semi tandem and tandem throughout, difficult, CGA Side stepping, challenging, CGA *Rest breaks needed throughout due to fatigue *clinic ambulation without AD and SBA and CGA based on levels of fatigue  PATIENT EDUCATION: Education details: Continuing HEP and scanning more for objects, cervical rotation to prevent hitting/tripping over things Person educated: Patient Education method: Explanation, Demonstration, and Verbal cues Education comprehension: verbalized understanding, returned demonstration, and needs further education  HOME EXERCISE PROGRAM: Access Code: [email or web address] URL: [email or web address] Date: 12/06/2023 Prepared by: [place] Senate  Exercises - Single Leg Stance with Support  - 1 x daily - 7 x weekly - 1 sets - 2-3 reps - 10 secs hold - Tandem Stance with Chair Support  - 1 x daily - 7 x weekly - 1 sets - 2 reps - 30 secs  hold - Standing Single  Leg Heel Raise  - 1 x daily - 7 x weekly - 1 sets - 10 reps - 2 secs hold - Walking with Head Rotation  - 1 x daily - 7 x weekly - 1 sets - 10 reps - Romberg Stance Eyes Closed on Foam Pad  - 1 x daily - 5 x weekly - 3 sets - 30 hold - Romberg Stance on Foam Pad with Head Rotation  - 1 x daily - 5 x weekly - 2 sets - 10 reps  GOALS: Goals reviewed with patient? Yes  SHORT TERM GOALS: Target date: 11/19/2023  Pt will be independent with HEP for improved balance and gait. Baseline: Goal status: Not met - 1st PT appt 11-22-23  2.  Pt will improve 5x sit<>stand to less than or equal to 12 sec to demonstrate improved functional strength and transfer efficiency. Baseline: 13.41 sec; 11-22-23 - 10.22 secs without UE support from chair Goal status: Goal met 11-22-23   LONG TERM GOALS: Target date: 12/17/2023  Pt will be independent with HEP for improved balance, gait. Baseline:  Goal status: Goal met 12-20-23  2.  Pt will improve FGA score to at least 23/30 to decrease fall risk. Baseline: 16/30;  23/30 (12-20-23) Goal status: Goal met   3.  Pt will improve gait velocity to at least 2.62 ft/sec for improved gait efficiency and safety. Baseline: 2.44 ft/sec;  12-20-23:  13.63, 11.75 = 2.79 ft/sec  Goal status: Goal met 12-20-23  4.  Pt will verbalize understanding of fall prevention in home environment. Baseline:  Goal status: Goal met 12-20-23  5.  Pt will verbalize plans for continued community fitness upon d/c from PT, to maximize gains made in PT. Baseline:  Goal status: Ongoing 12-20-23   NEW LONG TERM GOALS:  Target Date:  01-21-24    Pt will improve FGA score to at least 25/30 to decrease fall risk.    Baseline: 16/30;  23/30 (12-20-23); score 26/30    Goal status:  Goal  met 01-13-24  2.  Pt will improve gait velocity to at least 3.0 ft/sec for improved gait efficiency and safety. Baseline: 2.44 ft/sec;  12-20-23:  13.63, 11.75 = 2.79 ft/sec; 10.53 secs = 3.11 ft/sec  Goal  status:  Goal met 01-13-24  NEW UPDATED LONG TERM GOALS:  Target Date:  02-11-24    Pt will improve FGA score to at least 28/30 to decrease fall risk. Baseline: 16/30;  23/30 (12-20-23); score 26/30 Goal status:  Upgraded  2.  Pt will improve gait velocity to at least 3.4 ft/sec for improved gait efficiency and safety. Baseline: 2.44 ft/sec;  12-20-23:  13.63, 11.75 = 2.79 ft/sec; 10.53 secs = 3.11 ft/sec  Goal status:  Upgraded  3.  Pt will subjectively report at least 50% improvement in ability to recover LOB when he stumbles.    Baseline:   Goal status:  New    ASSESSMENT:  CLINICAL IMPRESSION: Patient was seem for skilled PT session today with a focus on dynamic balance and multidirectional stepping to and from compliant surface for task practice that reduce risk of falls. Intervention today also emphasized left visual field training with visual input from blazepods specifically on the left side. Patient found this challenging with increased time to find things in the left side of the body and when required to look down. No AD was used during ambulation around clinic today with SBA and CGA throughout based on levels of fatigue. Pt continues to need verbal cues to increase Lt cervical rotation to locate objects on his left side. Patient continues to benefit from skilled PT in order to improve above mention deficits and decrease risk of falls. Cont with POC.   OBJECTIVE IMPAIRMENTS: Abnormal gait, decreased balance, decreased mobility, difficulty walking, and decreased strength.   ACTIVITY LIMITATIONS: transfers and locomotion level  PARTICIPATION LIMITATIONS: driving, community activity, and fitness/leisure activities  PERSONAL FACTORS: 3+ comorbidities: PMH above are also affecting patient's functional outcome.   REHAB POTENTIAL: Good  CLINICAL DECISION MAKING: Stable/uncomplicated  EVALUATION COMPLEXITY: Low  PLAN:  PT FREQUENCY: 1x/week   PT DURATION: 4 weeks (per  recert)  PLANNED INTERVENTIONS: 97750- Physical Performance Testing, 97110-Therapeutic exercises, 97530- Therapeutic activity, [id number]- Neuromuscular re-education, 97535- Self Care, [id number]- Gait training, Patient/Family education, and Balance training  PLAN FOR NEXT SESSION: continue balance with visual scanning activity; work on balance tasks on compliant surfaces, head motions  dynamic gait and balance - activities to incorporate visual scanning, esp. On Lt side   Emmalene Sherry, Student-PT 01/19/2024, 3:11 PM   Referring diagnosis?  Z86.73 (ICD-10-CM) - History of stroke  H54.7 (ICD-10-CM) - Vision loss  Z92.89 (ICD-10-CM) - History of recent hospitalization   Treatment diagnosis? (if different than referring diagnosis) R 26.81, R 26.89, M62.81 What was this (referring dx) caused by? []  Surgery []  Fall []  Ongoing issue []  Arthritis [x]  Other: ____________  Laterality: []  Rt []  Lt [x]  Both  Check all possible CPT codes:  *CHOOSE 10 OR LESS*    See Planned Interventions listed in the Plan section of the Evaluation.

## 2024-01-20 ENCOUNTER — Encounter: Payer: Self-pay | Admitting: Internal Medicine

## 2024-01-20 ENCOUNTER — Ambulatory Visit (INDEPENDENT_AMBULATORY_CARE_PROVIDER_SITE_OTHER)

## 2024-01-20 ENCOUNTER — Ambulatory Visit: Attending: Internal Medicine | Admitting: Internal Medicine

## 2024-01-20 VITALS — BP 130/68 | HR 64 | Resp 16 | Ht 68.0 in | Wt 184.6 lb

## 2024-01-20 DIAGNOSIS — I10 Essential (primary) hypertension: Secondary | ICD-10-CM

## 2024-01-20 DIAGNOSIS — Z8673 Personal history of transient ischemic attack (TIA), and cerebral infarction without residual deficits: Secondary | ICD-10-CM | POA: Diagnosis not present

## 2024-01-20 DIAGNOSIS — I2510 Atherosclerotic heart disease of native coronary artery without angina pectoris: Secondary | ICD-10-CM

## 2024-01-20 DIAGNOSIS — N182 Chronic kidney disease, stage 2 (mild): Secondary | ICD-10-CM

## 2024-01-20 DIAGNOSIS — E785 Hyperlipidemia, unspecified: Secondary | ICD-10-CM | POA: Diagnosis not present

## 2024-01-20 DIAGNOSIS — Q2112 Patent foramen ovale: Secondary | ICD-10-CM | POA: Diagnosis not present

## 2024-01-20 DIAGNOSIS — I639 Cerebral infarction, unspecified: Secondary | ICD-10-CM

## 2024-01-20 LAB — CUP PACEART REMOTE DEVICE CHECK
Date Time Interrogation Session: 20251113055955
Implantable Pulse Generator Implant Date: 20250711
Pulse Gen Model: 5000
Pulse Gen Serial Number: 511069369

## 2024-01-20 NOTE — Patient Instructions (Signed)
 Medication Instructions:  No medication changes were made at this visit. Continue current regimen.   *If you need a refill on your cardiac medications before your next appointment, please call your pharmacy*  Lab Work: None ordered today. If you have labs (blood work) drawn today and your tests are completely normal, you will receive your results only by: MyChart Message (if you have MyChart) OR A paper copy in the mail If you have any lab test that is abnormal or we need to change your treatment, we will call you to review the results.  Testing/Procedures: None ordered today.  Follow-Up: At [facility], you and your health needs are our priority.  As part of our continuing mission to provide you with exceptional heart care, our providers are all part of one team.  This team includes your primary Cardiologist (physician) and Advanced Practice Providers or APPs (Physician Assistants and Nurse Practitioners) who all work together to provide you with the care you need, when you need it.  Your next appointment:   6 month(s)  Provider:   Arun Thukkani, MD

## 2024-01-21 ENCOUNTER — Ambulatory Visit: Payer: Self-pay | Admitting: Internal Medicine

## 2024-01-21 ENCOUNTER — Ambulatory Visit: Payer: Self-pay | Admitting: Cardiology

## 2024-01-21 LAB — HEPATIC FUNCTION PANEL
ALT: 25 IU/L (ref 0–44)
AST: 19 IU/L (ref 0–40)
Albumin: 4.4 g/dL (ref 3.8–4.8)
Alkaline Phosphatase: 96 IU/L (ref 47–123)
Bilirubin Total: 0.9 mg/dL (ref 0.0–1.2)
Bilirubin, Direct: 0.31 mg/dL (ref 0.00–0.40)
Total Protein: 6.5 g/dL (ref 6.0–8.5)

## 2024-01-21 LAB — LIPID PANEL
Chol/HDL Ratio: 2.7 ratio (ref 0.0–5.0)
Cholesterol, Total: 141 mg/dL (ref 100–199)
HDL: 52 mg/dL (ref >39)
LDL Chol Calc (NIH): 65 mg/dL (ref 0–99)
Triglycerides: 137 mg/dL (ref 0–149)
VLDL Cholesterol Cal: 24 mg/dL (ref 5–40)

## 2024-01-21 NOTE — Progress Notes (Signed)
 Remote Loop Recorder Transmission

## 2024-01-24 ENCOUNTER — Ambulatory Visit: Admitting: Occupational Therapy

## 2024-01-24 ENCOUNTER — Encounter: Payer: Self-pay | Admitting: Occupational Therapy

## 2024-01-24 ENCOUNTER — Ambulatory Visit: Admitting: Physical Therapy

## 2024-01-24 ENCOUNTER — Encounter: Payer: Self-pay | Admitting: Physical Therapy

## 2024-01-24 DIAGNOSIS — R41840 Attention and concentration deficit: Secondary | ICD-10-CM | POA: Diagnosis not present

## 2024-01-24 DIAGNOSIS — M6281 Muscle weakness (generalized): Secondary | ICD-10-CM | POA: Diagnosis not present

## 2024-01-24 DIAGNOSIS — R41844 Frontal lobe and executive function deficit: Secondary | ICD-10-CM

## 2024-01-24 DIAGNOSIS — R278 Other lack of coordination: Secondary | ICD-10-CM | POA: Diagnosis not present

## 2024-01-24 DIAGNOSIS — R2681 Unsteadiness on feet: Secondary | ICD-10-CM | POA: Diagnosis not present

## 2024-01-24 DIAGNOSIS — R2689 Other abnormalities of gait and mobility: Secondary | ICD-10-CM

## 2024-01-24 DIAGNOSIS — H53462 Homonymous bilateral field defects, left side: Secondary | ICD-10-CM

## 2024-01-24 DIAGNOSIS — R41842 Visuospatial deficit: Secondary | ICD-10-CM | POA: Diagnosis not present

## 2024-01-24 NOTE — Therapy (Signed)
 OUTPATIENT OCCUPATIONAL THERAPY NEURO TREATMENT   Patient Name: Terry Hays MRN: [id number] DOB:[birth year], 73 y.o., male Today's Date: 01/24/2024  PCP: [name], MD REFERRING PROVIDER: Bulah Alm RAMAN, PA-C  END OF SESSION:  OT End of Session - 01/24/24 0929     Visit Number 18    Number of Visits 22    Date for Recertification  02/05/24    Authorization Type [employer] PPO--17 visits approved 11/10/23-01/05/24, approved 8 visits from 01/05/24 - 04/04/24    Authorization - Visit Number 5    Authorization - Number of Visits 9    Progress Note Due on Visit 20    OT Start Time 0930    OT Stop Time 1015    OT Time Calculation (min) 45 min    Activity Tolerance Patient tolerated treatment well    Behavior During Therapy [facility] for tasks assessed/performed         Past Medical History:  Diagnosis Date   Abnormal ECG    Allergy    Anxiety    FLYING   Diverticulosis    Dyslipidemia    History of prostate cancer    Hyperlipidemia    Hypertension    LVH (left ventricular hypertrophy)    Pre-diabetes    Past Surgical History:  Procedure Laterality Date   COLONOSCOPY  2004 2011   MAGOD   EYE SURGERY  03/10/1999   CATOACTS BOTH   HERNIA REPAIR  03/09/1952   R INGHINAL   INGUINAL HERNIA REPAIR Left 04/07/2018   Procedure: OPERN LEFT INGUINAL HERNIA REPAIR WITH MESH ERAS PATHWAY;  Surgeon: Tanda Locus, MD;  Location: [facility];  Service: General;  Laterality: Left;   LOOP RECORDER INSERTION N/A 09/17/2023   Procedure: LOOP RECORDER INSERTION;  Surgeon: Lesia Ozell Barter, PA-C;  Location: [facility] INVASIVE CV LAB;  Service: Cardiovascular;  Laterality: N/A;   PENILE PROSTHESIS IMPLANT  03/09/2009   DAHLSTEDT   PROSTATE SURGERY  03/09/2001   PROSTRATECTOMY   TRANSESOPHAGEAL ECHOCARDIOGRAM (CATH LAB) N/A 12/13/2023   Procedure: TRANSESOPHAGEAL ECHOCARDIOGRAM;  Surgeon: Floretta Mallard, MD;  Location: [facility] INVASIVE CV LAB;  Service: Cardiovascular;   Laterality: N/A;   Patient Active Problem List   Diagnosis Date Noted   Coronary artery disease involving native coronary artery of native heart without angina pectoris 12/03/2023   History of CVA (cerebrovascular accident) 11/06/2023   Cognitive impairment 09/12/2023   Right lower lobe pulmonary nodule 09/08/2023   Attention deficit hyperactivity disorder (ADHD), predominantly hyperactive type 06/03/2021   Cherry angioma 06/03/2021   Seborrheic keratosis 06/03/2021   Arthritis 03/17/2018   Spinal stenosis of lumbosacral region 03/17/2018   Actinic keratosis 03/17/2018   Degeneration of lumbar intervertebral disc 10/27/2017   History of prostate cancer 09/15/2011   Fear of flying 09/15/2011   Family history of heart disease in male family member before age 52 09/15/2011   Allergic rhinitis 09/15/2011   Hemorrhoids 09/15/2011   Hypertension 06/27/2010   Dyslipidemia    LVH (left ventricular hypertrophy)     ONSET DATE: late June 2025  REFERRING DIAG: Z86.73, Z92.89, H54.7  THERAPY DIAG:  Hemianopia, homonymous, left  Visuospatial deficit  Attention and concentration deficit  Other lack of coordination  Frontal lobe and executive function deficit  Rationale for Evaluation and Treatment: Rehabilitation  SUBJECTIVE:   SUBJECTIVE STATEMENT: No pain and falls. P.T. has helped a lot with my balance  Pt accompanied by: self and spouse for end of session  PERTINENT HISTORY: CVA 09/08/23, hx  of falls  PRECAUTIONS: None  WEIGHT BEARING RESTRICTIONS: No  PAIN:  Are you having pain? No  FALLS: Has patient fallen in last 6 months? Yes. Number of falls unclear, but fell a couple weeks ago Not paying attention and has had stumbles but not falls.  LIVING ENVIRONMENT: Lives with: lives with their spouse Lives in: House/apartment Stairs: Yes: Internal: unclear but has a rail for internal steps steps; steps to enter home with rail and rail doing downstairs Has following  equipment at home: Single point cane  PLOF: Independent, Independent with basic ADLs, Independent with household mobility without device, and Independent with transfers  PATIENT GOALS: Pt would like to be able use his computer again for email, typing in word, etc. Would also love to be able to golf, play pickle ball etc. Ultimate goal is to drive.  OBJECTIVE:  Note: Objective measures were completed at Evaluation unless otherwise noted.  HAND DOMINANCE: Left  ADLs: Overall ADL: Mod I at this time, using a SPC for mobility.   IADLs: Overall pt is not driving and has assist for grocery shopping, bills, cooking, IADLs at this time.  Handwriting: 90% legible  MOBILITY STATUS: Hx of falls and needs SPC  POSTURE COMMENTS:  rounded shoulders Sitting balance: decreased core strength noted but overall demonstrates functional sitting balance.   ACTIVITY TOLERANCE: Activity tolerance: decreased but functional for ADL tasks, not able to do IADL tasks such as golf and pickleball  FUNCTIONAL OUTCOME MEASURES: NA - pt truly does not feel like he has LUE deficits from CVA and wants to work on vision  UPPER EXTREMITY ROM and MMT:     Active ROM Right eval Left eval  Shoulder flexion 150 WFL  (Blank rows = not tested)  MMT - Note mild deficits in B shoulders for shoulder flexion 2/2 old injuries from falls   HAND FUNCTION: WFL - note pt verbose and arrived late to evaluation, did not have time to asses grip strength as pt wanted to focus on vision. Will plan to assess in future sessions.   COORDINATION: Finger Nose Finger test: dysmetria and decreased coordination noted but unclear if this is from visual deficits to aim for other hand  11/25/23: 9 hole peg test: RT = 49.58 sec (slower d/t missing far Lt side and difficulty following directions even after multiple cues) Lt = 44.70 (difficulty following directions)   SENSATION: WFL  EDEMA: none  MUSCLE TONE: RUE: Within functional  limits and LUE: Within functional limits  COGNITION: Overall cognitive status: Impaired and Difficulty to assess due to: Note pt does not think deficits are significant but statements from S.O. in room states that cognition has been limited for comprehension, short term memory, and higher level tasks.   VISION: Subjective report: Pt reports he cannot see anything on the left - like its not even there. States he has learned various scanning and compensatory techniques.  Baseline vision: no glassess but demonstrates hemianopsia  Visual history: none prior to CVA   VISION ASSESSMENT: Impaired To be further assessed in functional context Eye alignment: Impaired:   Tracking/Visual pursuits: Decreased smoothness with horizontal tracking, Decreased smoothness with vertical tracking, Decreased smoothness of eye movement to Left superior field, Decreased smoothness of eye movement to Left inferior field, and Requires cues, head turns, or add eye shifts to track Visual Fields: Left homonymous hemianopsia  Patient has difficulty with following activities due to following visual impairments: driving, cooking, pickleball, golfing, has ad recent falls.   PERCEPTION: Impaired:  Inattention/neglect: does not attend to left visual field  PRAXIS: Not tested  OBSERVATIONS: The pt is a 73 yo male who is accompanied by his S.O. this date walking with a SPC who demonstrates dignificant visual deficits post CVA. Pt demonstrates slight deficits in BUE shoulder ROM but is fairly symmetrical and good MMT - does not feel like strength has been impacted from CVA from a neuro stand point. Pt with significant visual field deficits, L hemianopsia, and decreased attention to L visual field impacting skill performance.                                                                                                                             TREATMENT:    01/24/24:  Pt typing with computer typing games with prism   glasses - pt still missing words to center and slightly left of him and required cues to find word  Blaze pods for near body scanning:  - Random 1 color taps 3-1 minute sessions hitting 22, 27, 27 target colors with avg reaction 1910 - Distraction and scanning for cognitive component hitting 38 targets and 1 strike for longer session, avg reaction [id number]  Memory game for visual scanning, coordination, attention and visual memory with 6 pairs - mod to max cueing    01/19/24: Dice game for coordination, visual scanning, and cognition - pt required min cues initially and often did not realize he was pushing additional tiles down w/ targeted tile(s). Pt did better developing strategy w/ less rolls needed 2nd trial. However, required more rolls 3rd trial and occasional cues  Played memory game (5 pairs/matches of 5) 2 rows w/ min difficulty and cues to keep cards in same place for visual memory. 3rd trial added 6 pairs/matches of 6 (3 rows) w/ max difficulty and max cues needed.   01/13/24:  Pt arrived w/ Lt eyelid taped however tape was coming off. Pt reports he wore it last night. Therapist advised pt NOT to wear tape at night, but only during day prn. Pt also just pulled tape off w/o protecting the eyelid - pt instructed to hold eyelid w/ other hand, while gently taking off tape.   Pt copying block designs using 1 colored blocks - pt able to copy simple designs w/ mild difficulty. Pt given 4 trials getting increasingly slightly harder (but blocks still delineated in pictures) Progressed to copying large parquetry wooden block design - pt required mod cues to distinguish b/t triangles and parallelograms and also cues to develop strategies (getting all pieces out of box prior to making design). Pt also required cues to look far Lt to see all pieces. Pt has improved with correctly angling/oriented pieces during activity today  Pt played kid aged dominoes w/ initial cues but then played very well w/  only min cues to scan fully to Lt side  PATIENT EDUCATION: Education details: see above Person educated: Patient  Education method: Explanation, Demonstration, and Verbal cues Education comprehension: verbalized  understanding, returned demonstration, verbal cues required, and needs further education  HOME EXERCISE PROGRAM: 11/22/23:  Visual HEP and compensation strategies  11/25/23: coordination HEP  12/20/23: Golf Solitaire  GOALS: Goals reviewed with patient? Yes  SHORT TERM GOALS: Target date: 12/08/23  Pt will be Supervision with vision specific HEP to further integrate strategies and scanning into daily life for max recovery and benefit.  Baseline: Pt does incooperate techniques from hospital but would benefit from further training  Goal status: MET  2.  Pt will demonstrate improved visual scanning to locate 5 items in a room with no more than 2 verbal cues to locate items.  Baseline:  5 items, needing 3+ cues for scanning Goal status: MET in min distracting environment.  12/06/23  3.  Pt will demonstrate improved visual acuity and/or use of adaptations to use his personal computer for leisure activities such as email, typing in Word, etc with no more than Supervision.  Baseline: Pt reports significant difficulty with this Goal status: PARTIALLY MET w/ occasional cues (12/09/23 - Needed mod cueing to send email, use word, etc) 12/23/2023 - using phone more than computer  4.  Pt will perform a visual scanning task/worksheet of choice with no more than 2 errors in order to demonstrate improved scanning and acuity.  Goal status: Ongoing  12/06/23 1.84M scanning sheet with 7 errors (93%)  12/22/2023: 1.84M double cancellation sheet (91% accuracy)  LONG TERM GOALS: Target date: 01/05/24  Pt will be MOD I with vision specific HEP to further integrate strategies and scanning into daily life for max recovery and benefit.  Baseline: Pt does incooperate techniques from hospital but would  benefit from further training  Goal status: IN Progress  2.  Pt will demonstrate improved visual scanning to locate 5 items in a room with no verbal cues to locate items.  Baseline:  5 items, needing 3+ cues for scanning Goal status: MET  3.  Pt will demonstrate improved visual acuity and/or use of adaptations to use his personal computer for leisure activities such as email, typing in Word, etc with Independence. Baseline: Pt reports significant difficulty with this Goal status: IN PROGRESS (have practiced in clinic but continues to need some assist)   4.  If safe and appropriate, pt will report a return to some leisure activities such as modified golf, pickle ball, walking, etc with use of strategies, AE, or compensatory techniques to improve safety and participation.  Baseline: not safe and not performing at this time 12/22/2023: pt able to walk to therapy appointment today Goal status: IN PROGRESS (pt has returned to all but pickle ball d/t vision and balance)  5.  Pt will perform environmental scanning in moderately distracting gym consistently at 80% accuracy  Baseline: 01/03/24 = 80% accuracy down quiet hallway only with extra time Goal status: INITIAL   ASSESSMENT:  CLINICAL IMPRESSION: Pt has met 3/4 STG's and 1 LTG.  Pt continues to require cues to look to Lt side. Pt also continues to demo deficits in visual/perceptual skills and visual memory. Pt would continue to benefit from skilled O.T. to address these remaining goals, improve safety, and maximize function.   PERFORMANCE DEFICITS: in functional skills including ADLs, IADLs, proprioception, endurance, and vision, cognitive skills including attention, memory, problem solving, safety awareness, and sequencing, and psychosocial skills including coping strategies, environmental adaptation, habits, and routines and behaviors.   IMPAIRMENTS: are limiting patient from ADLs, IADLs, leisure, and social participation.    CO-MORBIDITIES: has no other co-morbidities that  affects occupational performance. Patient will benefit from skilled OT to address above impairments and improve overall function.  REHAB POTENTIAL: Good  PLAN:  OT FREQUENCY: 2x/week  OT DURATION: 4 additional weeks (through end of November)  PLANNED INTERVENTIONS: 02831 OT Re-evaluation, 97535 self care/ADL training, 02889 therapeutic exercise, 97530 therapeutic activity, and 97112 neuromuscular re-education  RECOMMENDED OTHER SERVICES: NA  CONSULTED AND AGREED WITH PLAN OF CARE: Patient and family adult nurse  PLAN FOR NEXT SESSION:  copying parquetry designs, modified Londa Burnard JINNY Abelardo, OTR/L 01/24/2024, 9:30 AM

## 2024-01-24 NOTE — Therapy (Signed)
 OUTPATIENT PHYSICAL THERAPY NEURO TREATMENT NOTE/10th VISIT PN   Patient Name: Terry Hays MRN: [id number] DOB:Feb 03, 1951, 73 y.o., male Today's Date: 01/24/2024   PCP: [name], MD  REFERRING PROVIDER: Bulah Alm RAMAN, PA-C   10th Visit Physical Therapy Progress Note  Dates of Reporting Period: 11/05/23 to 01/24/24    END OF SESSION:  PT End of Session - 01/24/24 0846     Visit Number 10    Number of Visits 12    Date for Recertification  02/11/24   from re-cert   Authorization Type [employer]-submitted auth at eval    Authorization Time Period 4 PT visits 10/13-1/11/26    Progress Note Due on Visit 10    PT Start Time 0845    PT Stop Time 0926    PT Time Calculation (min) 41 min    Equipment Utilized During Treatment Gait belt    Activity Tolerance Patient tolerated treatment well    Behavior During Therapy [facility] for tasks assessed/performed             Past Medical History:  Diagnosis Date   Abnormal ECG    Allergy    Anxiety    FLYING   Diverticulosis    Dyslipidemia    History of prostate cancer    Hyperlipidemia    Hypertension    LVH (left ventricular hypertrophy)    Pre-diabetes    Past Surgical History:  Procedure Laterality Date   COLONOSCOPY  2004 2011   MAGOD   EYE SURGERY  03/10/1999   CATOACTS BOTH   HERNIA REPAIR  03/09/1952   R INGHINAL   INGUINAL HERNIA REPAIR Left 04/07/2018   Procedure: OPERN LEFT INGUINAL HERNIA REPAIR WITH MESH ERAS PATHWAY;  Surgeon: Tanda Locus, MD;  Location: [facility]S;  Service: General;  Laterality: Left;   LOOP RECORDER INSERTION N/A 09/17/2023   Procedure: LOOP RECORDER INSERTION;  Surgeon: Lesia Ozell Barter, PA-C;  Location: [facility] INVASIVE CV LAB;  Service: Cardiovascular;  Laterality: N/A;   PENILE PROSTHESIS IMPLANT  03/09/2009   DAHLSTEDT   PROSTATE SURGERY  03/09/2001   PROSTRATECTOMY   TRANSESOPHAGEAL ECHOCARDIOGRAM (CATH LAB) N/A 12/13/2023   Procedure: TRANSESOPHAGEAL  ECHOCARDIOGRAM;  Surgeon: Floretta Mallard, MD;  Location: [facility] INVASIVE CV LAB;  Service: Cardiovascular;  Laterality: N/A;   Patient Active Problem List   Diagnosis Date Noted   Coronary artery disease involving native coronary artery of native heart without angina pectoris 12/03/2023   History of CVA (cerebrovascular accident) 11/06/2023   Cognitive impairment 09/12/2023   Right lower lobe pulmonary nodule 09/08/2023   Attention deficit hyperactivity disorder (ADHD), predominantly hyperactive type 06/03/2021   Cherry angioma 06/03/2021   Seborrheic keratosis 06/03/2021   Arthritis 03/17/2018   Spinal stenosis of lumbosacral region 03/17/2018   Actinic keratosis 03/17/2018   Degeneration of lumbar intervertebral disc 10/27/2017   History of prostate cancer 09/15/2011   Fear of flying 09/15/2011   Family history of heart disease in male family member before age 55 09/15/2011   Allergic rhinitis 09/15/2011   Hemorrhoids 09/15/2011   Hypertension 06/27/2010   Dyslipidemia    LVH (left ventricular hypertrophy)     ONSET DATE: 10/20/2023 (MD referral)  REFERRING DIAG:  Z86.73 (ICD-10-CM) - History of stroke  H54.7 (ICD-10-CM) - Vision loss  Z92.89 (ICD-10-CM) - History of recent hospitalization    THERAPY DIAG:  Muscle weakness (generalized)  Unsteadiness on feet  Other abnormalities of gait and mobility  Rationale for Evaluation and Treatment: Rehabilitation  SUBJECTIVE:                                                                                                                                                                                             SUBJECTIVE STATEMENT:  No falls, will have a couple of stumbles, but is always able to catch himself. Notes HEP is still a challenge. Went to see the [place] [employer] over the weekend and his eyes were tired. His prisms keep him from bumping into things on his L side.   Pt accompanied by: self  PERTINENT HISTORY: PMH prior  CVAs (unknown to patient), ADHD, HTN, HLD, prostate cancer, diverticulosis who presented to the ER with confusion and left visual field changes (09/2023)  PAIN:  Are you having pain? No  PRECAUTIONS: Fall and Other: decreased peripheral vision from CVA   Avoid driving  RED FLAGS: None   WEIGHT BEARING RESTRICTIONS: No  FALLS: Has patient fallen in last 6 months? Yes. Number of falls 1-R knee gave way  LIVING ENVIRONMENT: Lives with: lives with their spouse Lives in: House/apartment Stairs: steps to enter with rails; upstairs and downstairs with rail Has following equipment at home: Single point cane  PLOF: Independent and Leisure: played pickleball and golf; was working out for core strengthening and walking several miles everyday  PATIENT GOALS: To get back to playing pickleball [facility]) and golf  OBJECTIVE:  Note: Objective measures were completed at Evaluation unless otherwise noted.  DIAGNOSTIC FINDINGS: acute CVA of the right posterior cerebral artery,   COGNITION: Overall cognitive status: Within functional limits for tasks assessed   SENSATION: Light touch: WFL  COORDINATION: WFL for bilat alt taps   MUSCLE TONE: WFL  POSTURE: rounded shoulders  LOWER EXTREMITY ROM:   WFL BLEs   LOWER EXTREMITY MMT:    MMT Right Eval Left Eval  Hip flexion 4 4+  Hip extension    Hip abduction 5 5  Hip adduction 5 5  Hip internal rotation    Hip external rotation    Knee flexion 5 5  Knee extension 4 4  Ankle dorsiflexion 4 4  Ankle plantarflexion    Ankle inversion    Ankle eversion    (Blank rows = not tested)  TRANSFERS: Sit to stand: Modified independence  Assistive device utilized: None     Stand to sit: Modified independence  Assistive device utilized: None      GAIT: Findings: Gait Characteristics: veers to R, step through pattern, and wide BOS, Distance walked: 50 ft, Assistive device utilized:Single point cane, and Level of assistance:  SBA  FUNCTIONAL TESTS:  5 times  sit to stand: 13.91 sec Timed up and go (TUG): NT 10 meter walk test: 13.41 veers to R= 2.44 ft/sec FGA= 16/30  M-CTSIB  Condition 1: Firm Surface, EO 30 Sec, Normal Sway  Condition 2: Firm Surface, EC 30 Sec, Normal Sway  Condition 3: Foam Surface, EO 30 Sec, Mild Sway  Condition 4: Foam Surface, EC 30 Sec, Mild and Moderate Sway                                                                                                                                TREATMENT DATE: 01/24/24  NMR:  On air ex: Holding 3/4 tandem stance and playing Spot It to work on visual scanning, head movements, scanning to the L, performed 3 reps with each leg posteriorly, pt taking variable amount of time with each card, pt fatigues easily with this  Alternating forward stepping off air ex and throwing 6# medicine ball to the floor (performing trunk rotation over forward leg) and catching it and stepping back on, performed 2 sets of 4-5 reps each side, pt fatigues easily with this as pt reports this exercise is a lot to think about with the sequencing, CGA for balance  With feet together: EO diagonals each direction with ball 10 reps, holding ball and making CW/CCW circles 10 reps each side  With 4 larger orange obstacles and 2 air ex pads in between each: Working on alternating stepping over obstacles, down and back x5 reps, cues for slowed/controlled and clearing obstacles with hip/knee flexion rather than circumduction, pt intermittently performing with step to pattern due to fatigue, CGA for balance, pt reporting RPE as 10/10  PATIENT EDUCATION: Education details: Continuing HEP and scanning more for objects due to visual field deficit Person educated: Patient Education method: Programmer, Multimedia, Facilities Manager, and Verbal cues Education comprehension: verbalized understanding, returned demonstration, and needs further education  HOME EXERCISE PROGRAM: Access Code:  [email or web address] URL: [email or web address] Date: 12/06/2023 Prepared by: Sheffield Senate  Exercises - Single Leg Stance with Support  - 1 x daily - 7 x weekly - 1 sets - 2-3 reps - 10 secs hold - Tandem Stance with Chair Support  - 1 x daily - 7 x weekly - 1 sets - 2 reps - 30 secs  hold - Standing Single Leg Heel Raise  - 1 x daily - 7 x weekly - 1 sets - 10 reps - 2 secs hold - Walking with Head Rotation  - 1 x daily - 7 x weekly - 1 sets - 10 reps - Romberg Stance Eyes Closed on Foam Pad  - 1 x daily - 5 x weekly - 3 sets - 30 hold - Romberg Stance on Foam Pad with Head Rotation  - 1 x daily - 5 x weekly - 2 sets - 10 reps  GOALS: Goals reviewed with patient? Yes  SHORT TERM GOALS: Target date: 11/19/2023  Pt will be independent with HEP for improved balance  and gait. Baseline: Goal status: Not met - 1st PT appt 11-22-23  2.  Pt will improve 5x sit<>stand to less than or equal to 12 sec to demonstrate improved functional strength and transfer efficiency. Baseline: 13.41 sec; 11-22-23 - 10.22 secs without UE support from chair Goal status: Goal met 11-22-23   LONG TERM GOALS: Target date: 12/17/2023  Pt will be independent with HEP for improved balance, gait. Baseline:  Goal status: Goal met 12-20-23  2.  Pt will improve FGA score to at least 23/30 to decrease fall risk. Baseline: 16/30;  23/30 (12-20-23) Goal status: Goal met   3.  Pt will improve gait velocity to at least 2.62 ft/sec for improved gait efficiency and safety. Baseline: 2.44 ft/sec;  12-20-23:  13.63, 11.75 = 2.79 ft/sec  Goal status: Goal met 12-20-23  4.  Pt will verbalize understanding of fall prevention in home environment. Baseline:  Goal status: Goal met 12-20-23  5.  Pt will verbalize plans for continued community fitness upon d/c from PT, to maximize gains made in PT. Baseline:  Goal status: Ongoing 12-20-23   NEW LONG TERM GOALS:  Target Date:  01-21-24    Pt will improve FGA score to  at least 25/30 to decrease fall risk.    Baseline: 16/30;  23/30 (12-20-23); score 26/30    Goal status:  Goal met 01-13-24  2.  Pt will improve gait velocity to at least 3.0 ft/sec for improved gait efficiency and safety. Baseline: 2.44 ft/sec;  12-20-23:  13.63, 11.75 = 2.79 ft/sec; 10.53 secs = 3.11 ft/sec  Goal status:  Goal met 01-13-24  NEW UPDATED LONG TERM GOALS:  Target Date:  02-11-24    Pt will improve FGA score to at least 28/30 to decrease fall risk. Baseline: 16/30;  23/30 (12-20-23); score 26/30 Goal status:  Upgraded  2.  Pt will improve gait velocity to at least 3.4 ft/sec for improved gait efficiency and safety. Baseline: 2.44 ft/sec;  12-20-23:  13.63, 11.75 = 2.79 ft/sec; 10.53 secs = 3.11 ft/sec  Goal status:  Upgraded  3.  Pt will subjectively report at least 50% improvement in ability to recover LOB when he stumbles.    Baseline:   Goal status:  New    ASSESSMENT:  CLINICAL IMPRESSION: 10th visit PN: Pt's LTGs assessed on 01/13/24. Pt met goals in regards to FGA (scored a 26/30, indicating a low fall risk) and improved his gait speed with no AD to demo improved community mobility. Pt's balance continues to be impacted by pt's Lt visual field deficits, however this has improved since pt recently got prisms in his glasses on the L side. Pt reports he has been running into objects less on the L. Today's skilled session continued to focus on high level balance tasks with visual scanning, head movements, and coordination. Pt needing CGA for balance tasks and needing intermittent rest breaks due to fatigue (esp with exercises with visual task or multi-instruction). Will continue per POC.    OBJECTIVE IMPAIRMENTS: Abnormal gait, decreased balance, decreased mobility, difficulty walking, and decreased strength.   ACTIVITY LIMITATIONS: transfers and locomotion level  PARTICIPATION LIMITATIONS: driving, community activity, and fitness/leisure activities  PERSONAL FACTORS:  3+ comorbidities: PMH above are also affecting patient's functional outcome.   REHAB POTENTIAL: Good  CLINICAL DECISION MAKING: Stable/uncomplicated  EVALUATION COMPLEXITY: Low  PLAN:  PT FREQUENCY: 1x/week   PT DURATION: 4 weeks (per recert)  PLANNED INTERVENTIONS: 97750- Physical Performance Testing, 97110-Therapeutic exercises, 97530- Therapeutic activity, [id number]- Neuromuscular  re-education, [phone]- Self Care, [id number]- Gait training, Patient/Family education, and Balance training  PLAN FOR NEXT SESSION: continue balance with visual scanning activity; work on balance tasks on compliant surfaces, head motions  dynamic gait and balance - activities to incorporate visual scanning, esp. On Lt side   Sheffield LOISE Senate, PT, DPT 01/24/2024, 9:30 AM   Referring diagnosis?  Z86.73 (ICD-10-CM) - History of stroke  H54.7 (ICD-10-CM) - Vision loss  Z92.89 (ICD-10-CM) - History of recent hospitalization   Treatment diagnosis? (if different than referring diagnosis) R 26.81, R 26.89, M62.81 What was this (referring dx) caused by? []  Surgery []  Fall []  Ongoing issue []  Arthritis [x]  Other: ____________  Laterality: []  Rt []  Lt [x]  Both  Check all possible CPT codes:  *CHOOSE 10 OR LESS*    See Planned Interventions listed in the Plan section of the Evaluation.

## 2024-01-26 ENCOUNTER — Encounter: Payer: Self-pay | Admitting: Occupational Therapy

## 2024-01-26 ENCOUNTER — Ambulatory Visit: Admitting: Occupational Therapy

## 2024-01-26 ENCOUNTER — Other Ambulatory Visit: Payer: Self-pay

## 2024-01-26 DIAGNOSIS — R41842 Visuospatial deficit: Secondary | ICD-10-CM

## 2024-01-26 DIAGNOSIS — R2681 Unsteadiness on feet: Secondary | ICD-10-CM | POA: Diagnosis not present

## 2024-01-26 DIAGNOSIS — H53462 Homonymous bilateral field defects, left side: Secondary | ICD-10-CM | POA: Diagnosis not present

## 2024-01-26 DIAGNOSIS — E785 Hyperlipidemia, unspecified: Secondary | ICD-10-CM

## 2024-01-26 DIAGNOSIS — M6281 Muscle weakness (generalized): Secondary | ICD-10-CM | POA: Diagnosis not present

## 2024-01-26 DIAGNOSIS — R2689 Other abnormalities of gait and mobility: Secondary | ICD-10-CM | POA: Diagnosis not present

## 2024-01-26 DIAGNOSIS — R278 Other lack of coordination: Secondary | ICD-10-CM

## 2024-01-26 DIAGNOSIS — R41840 Attention and concentration deficit: Secondary | ICD-10-CM

## 2024-01-26 DIAGNOSIS — R41844 Frontal lobe and executive function deficit: Secondary | ICD-10-CM | POA: Diagnosis not present

## 2024-01-26 MED ORDER — EZETIMIBE 10 MG PO TABS: 10.0000 mg | ORAL_TABLET | Freq: Every day | ORAL | 3 refills | Status: AC

## 2024-01-26 NOTE — Therapy (Signed)
 OUTPATIENT OCCUPATIONAL THERAPY NEURO TREATMENT   Patient Name: Terry Hays MRN: [id number] DOB:[birth year], 73 y.o., male Today's Date: 01/26/2024  PCP: [name], MD REFERRING PROVIDER: Bulah Alm RAMAN, PA-C  END OF SESSION:  OT End of Session - 01/26/24 0946     Visit Number 19    Number of Visits 22    Date for Recertification  02/05/24    Authorization Type [employer] PPO--17 visits approved 11/10/23-01/05/24, approved 8 visits from 01/05/24 - 04/04/24    Authorization - Visit Number 6    Authorization - Number of Visits 9    Progress Note Due on Visit 20    OT Start Time 0930    OT Stop Time 1015    OT Time Calculation (min) 45 min    Activity Tolerance Patient tolerated treatment well    Behavior During Therapy [facility] for tasks assessed/performed         Past Medical History:  Diagnosis Date   Abnormal ECG    Allergy    Anxiety    FLYING   Diverticulosis    Dyslipidemia    History of prostate cancer    Hyperlipidemia    Hypertension    LVH (left ventricular hypertrophy)    Pre-diabetes    Past Surgical History:  Procedure Laterality Date   COLONOSCOPY  2004 2011   MAGOD   EYE SURGERY  03/10/1999   CATOACTS BOTH   HERNIA REPAIR  03/09/1952   R INGHINAL   INGUINAL HERNIA REPAIR Left 04/07/2018   Procedure: OPERN LEFT INGUINAL HERNIA REPAIR WITH MESH ERAS PATHWAY;  Surgeon: Tanda Locus, MD;  Location: [facility];  Service: General;  Laterality: Left;   LOOP RECORDER INSERTION N/A 09/17/2023   Procedure: LOOP RECORDER INSERTION;  Surgeon: Lesia Ozell Barter, PA-C;  Location: [facility] INVASIVE CV LAB;  Service: Cardiovascular;  Laterality: N/A;   PENILE PROSTHESIS IMPLANT  03/09/2009   DAHLSTEDT   PROSTATE SURGERY  03/09/2001   PROSTRATECTOMY   TRANSESOPHAGEAL ECHOCARDIOGRAM (CATH LAB) N/A 12/13/2023   Procedure: TRANSESOPHAGEAL ECHOCARDIOGRAM;  Surgeon: Floretta Mallard, MD;  Location: [facility] INVASIVE CV LAB;  Service: Cardiovascular;   Laterality: N/A;   Patient Active Problem List   Diagnosis Date Noted   Coronary artery disease involving native coronary artery of native heart without angina pectoris 12/03/2023   History of CVA (cerebrovascular accident) 11/06/2023   Cognitive impairment 09/12/2023   Right lower lobe pulmonary nodule 09/08/2023   Attention deficit hyperactivity disorder (ADHD), predominantly hyperactive type 06/03/2021   Cherry angioma 06/03/2021   Seborrheic keratosis 06/03/2021   Arthritis 03/17/2018   Spinal stenosis of lumbosacral region 03/17/2018   Actinic keratosis 03/17/2018   Degeneration of lumbar intervertebral disc 10/27/2017   History of prostate cancer 09/15/2011   Fear of flying 09/15/2011   Family history of heart disease in male family member before age 61 09/15/2011   Allergic rhinitis 09/15/2011   Hemorrhoids 09/15/2011   Hypertension 06/27/2010   Dyslipidemia    LVH (left ventricular hypertrophy)     ONSET DATE: late June 2025  REFERRING DIAG: Z86.73, Z92.89, H54.7  THERAPY DIAG:  Hemianopia, homonymous, left  Visuospatial deficit  Attention and concentration deficit  Other lack of coordination  Rationale for Evaluation and Treatment: Rehabilitation  SUBJECTIVE:   SUBJECTIVE STATEMENT: No pain today. Happy to be alive  Pt accompanied by: self and spouse for end of session  PERTINENT HISTORY: CVA 09/08/23, hx of falls  PRECAUTIONS: None  WEIGHT BEARING RESTRICTIONS: No  PAIN:  Are you having pain? No  FALLS: Has patient fallen in last 6 months? Yes. Number of falls unclear, but fell a couple weeks ago Not paying attention and has had stumbles but not falls.  LIVING ENVIRONMENT: Lives with: lives with their spouse Lives in: House/apartment Stairs: Yes: Internal: unclear but has a rail for internal steps steps; steps to enter home with rail and rail doing downstairs Has following equipment at home: Single point cane  PLOF: Independent, Independent  with basic ADLs, Independent with household mobility without device, and Independent with transfers  PATIENT GOALS: Pt would like to be able use his computer again for email, typing in word, etc. Would also love to be able to golf, play pickle ball etc. Ultimate goal is to drive.  OBJECTIVE:  Note: Objective measures were completed at Evaluation unless otherwise noted.  HAND DOMINANCE: Left  ADLs: Overall ADL: Mod I at this time, using a SPC for mobility.   IADLs: Overall pt is not driving and has assist for grocery shopping, bills, cooking, IADLs at this time.  Handwriting: 90% legible  MOBILITY STATUS: Hx of falls and needs SPC  POSTURE COMMENTS:  rounded shoulders Sitting balance: decreased core strength noted but overall demonstrates functional sitting balance.   ACTIVITY TOLERANCE: Activity tolerance: decreased but functional for ADL tasks, not able to do IADL tasks such as golf and pickleball  FUNCTIONAL OUTCOME MEASURES: NA - pt truly does not feel like he has LUE deficits from CVA and wants to work on vision  UPPER EXTREMITY ROM and MMT:     Active ROM Right eval Left eval  Shoulder flexion 150 WFL  (Blank rows = not tested)  MMT - Note mild deficits in B shoulders for shoulder flexion 2/2 old injuries from falls   HAND FUNCTION: WFL - note pt verbose and arrived late to evaluation, did not have time to asses grip strength as pt wanted to focus on vision. Will plan to assess in future sessions.   COORDINATION: Finger Nose Finger test: dysmetria and decreased coordination noted but unclear if this is from visual deficits to aim for other hand  11/25/23: 9 hole peg test: RT = 49.58 sec (slower d/t missing far Lt side and difficulty following directions even after multiple cues) Lt = 44.70 (difficulty following directions)   SENSATION: WFL  EDEMA: none  MUSCLE TONE: RUE: Within functional limits and LUE: Within functional limits  COGNITION: Overall cognitive  status: Impaired and Difficulty to assess due to: Note pt does not think deficits are significant but statements from S.O. in room states that cognition has been limited for comprehension, short term memory, and higher level tasks.   VISION: Subjective report: Pt reports he cannot see anything on the left - like its not even there. States he has learned various scanning and compensatory techniques.  Baseline vision: no glassess but demonstrates hemianopsia  Visual history: none prior to CVA   VISION ASSESSMENT: Impaired To be further assessed in functional context Eye alignment: Impaired:   Tracking/Visual pursuits: Decreased smoothness with horizontal tracking, Decreased smoothness with vertical tracking, Decreased smoothness of eye movement to Left superior field, Decreased smoothness of eye movement to Left inferior field, and Requires cues, head turns, or add eye shifts to track Visual Fields: Left homonymous hemianopsia  Patient has difficulty with following activities due to following visual impairments: driving, cooking, pickleball, golfing, has ad recent falls.   PERCEPTION: Impaired: Inattention/neglect: does not attend to left visual field  PRAXIS: Not tested  OBSERVATIONS: The pt is a 73 yo male who is accompanied by his S.O. this date walking with a SPC who demonstrates dignificant visual deficits post CVA. Pt demonstrates slight deficits in BUE shoulder ROM but is fairly symmetrical and good MMT - does not feel like strength has been impacted from CVA from a neuro stand point. Pt with significant visual field deficits, L hemianopsia, and decreased attention to L visual field impacting skill performance.                                                                                                                             TREATMENT:    01/26/24:  Pt copying parquetry designs 3 trials: first 2 trials w/ simpler design requiring min to mod cues. 3rd trial w/ complex design  requiring max cues to orient pcs  Played 2 rounds of modified Yahtzee (only pt, not opponent) for coordination Lt hand, visual scanning, cognition, and memory. First round - pt required max cueing as pt was unfamiliar with game. Pt also required cues to look to Lt side and to scan for all dice, and cues to use Lt hand for rolling dice and picking up dice. Second round - pt did better w/ less cues required.  Pt could add up total number quickly with great ease at end of both rounds  Pt attempted to walk into staff office at end of session as he did not attend to Lt side for hallway to exit.   01/24/24:  Pt typing with computer typing games with prism  glasses - pt still missing words to center and slightly left of him and required cues to find word  Blaze pods for near body scanning:  - Random 1 color taps 3-1 minute sessions hitting 22, 27, 27 target colors with avg reaction 1910 - Distraction and scanning for cognitive component hitting 38 targets and 1 strike for longer session, avg reaction [id number]  Memory game for visual scanning, coordination, attention and visual memory with 6 pairs - mod to max cueing    01/19/24: Dice game for coordination, visual scanning, and cognition - pt required min cues initially and often did not realize he was pushing additional tiles down w/ targeted tile(s). Pt did better developing strategy w/ less rolls needed 2nd trial. However, required more rolls 3rd trial and occasional cues  Played memory game (5 pairs/matches of 5) 2 rows w/ min difficulty and cues to keep cards in same place for visual memory. 3rd trial added 6 pairs/matches of 6 (3 rows) w/ max difficulty and max cues needed.   01/13/24:  Pt arrived w/ Lt eyelid taped however tape was coming off. Pt reports he wore it last night. Therapist advised pt NOT to wear tape at night, but only during day prn. Pt also just pulled tape off w/o protecting the eyelid - pt instructed to hold eyelid w/ other hand,  while gently taking off tape.   Pt copying block designs  using 1 colored blocks - pt able to copy simple designs w/ mild difficulty. Pt given 4 trials getting increasingly slightly harder (but blocks still delineated in pictures) Progressed to copying large parquetry wooden block design - pt required mod cues to distinguish b/t triangles and parallelograms and also cues to develop strategies (getting all pieces out of box prior to making design). Pt also required cues to look far Lt to see all pieces. Pt has improved with correctly angling/oriented pieces during activity today  Pt played kid aged dominoes w/ initial cues but then played very well w/ only min cues to scan fully to Lt side  PATIENT EDUCATION: Education details: see above Person educated: Patient  Education method: Explanation, Demonstration, and Verbal cues Education comprehension: verbalized understanding, returned demonstration, verbal cues required, and needs further education  HOME EXERCISE PROGRAM: 11/22/23:  Visual HEP and compensation strategies  11/25/23: coordination HEP  12/20/23: Golf Solitaire  GOALS: Goals reviewed with patient? Yes  SHORT TERM GOALS: Target date: 12/08/23  Pt will be Supervision with vision specific HEP to further integrate strategies and scanning into daily life for max recovery and benefit.  Baseline: Pt does incooperate techniques from hospital but would benefit from further training  Goal status: MET  2.  Pt will demonstrate improved visual scanning to locate 5 items in a room with no more than 2 verbal cues to locate items.  Baseline:  5 items, needing 3+ cues for scanning Goal status: MET in min distracting environment.  12/06/23  3.  Pt will demonstrate improved visual acuity and/or use of adaptations to use his personal computer for leisure activities such as email, typing in Word, etc with no more than Supervision.  Baseline: Pt reports significant difficulty with this Goal status:  PARTIALLY MET w/ occasional cues (12/09/23 - Needed mod cueing to send email, use word, etc) 12/23/2023 - using phone more than computer  4.  Pt will perform a visual scanning task/worksheet of choice with no more than 2 errors in order to demonstrate improved scanning and acuity.  Goal status: Ongoing  12/06/23 1.61M scanning sheet with 7 errors (93%)  12/22/2023: 1.61M double cancellation sheet (91% accuracy)  LONG TERM GOALS: Target date: 01/05/24  Pt will be MOD I with vision specific HEP to further integrate strategies and scanning into daily life for max recovery and benefit.  Baseline: Pt does incooperate techniques from hospital but would benefit from further training  Goal status: IN Progress  2.  Pt will demonstrate improved visual scanning to locate 5 items in a room with no verbal cues to locate items.  Baseline:  5 items, needing 3+ cues for scanning Goal status: MET  3.  Pt will demonstrate improved visual acuity and/or use of adaptations to use his personal computer for leisure activities such as email, typing in Word, etc with Independence. Baseline: Pt reports significant difficulty with this Goal status: IN PROGRESS (have practiced in clinic but continues to need some assist)   4.  If safe and appropriate, pt will report a return to some leisure activities such as modified golf, pickle ball, walking, etc with use of strategies, AE, or compensatory techniques to improve safety and participation.  Baseline: not safe and not performing at this time 12/22/2023: pt able to walk to therapy appointment today Goal status: IN PROGRESS (pt has returned to all but pickle ball d/t vision and balance)  5.  Pt will perform environmental scanning in moderately distracting gym consistently at 80% accuracy  Baseline: 01/03/24 = 80% accuracy down quiet hallway only with extra time Goal status: INITIAL   ASSESSMENT:  CLINICAL IMPRESSION: Pt has met 3/4 STG's and 1 LTG.  Pt continues to  require cues to look to Lt side. Pt also continues to demo deficits in visual/perceptual skills and visual memory. Pt would continue to benefit from skilled O.T. to address these remaining goals, improve safety, and maximize function.   PERFORMANCE DEFICITS: in functional skills including ADLs, IADLs, proprioception, endurance, and vision, cognitive skills including attention, memory, problem solving, safety awareness, and sequencing, and psychosocial skills including coping strategies, environmental adaptation, habits, and routines and behaviors.   IMPAIRMENTS: are limiting patient from ADLs, IADLs, leisure, and social participation.   CO-MORBIDITIES: has no other co-morbidities that affects occupational performance. Patient will benefit from skilled OT to address above impairments and improve overall function.  REHAB POTENTIAL: Good  PLAN:  OT FREQUENCY: 2x/week  OT DURATION: 4 additional weeks (through end of November)  PLANNED INTERVENTIONS: 02831 OT Re-evaluation, 97535 self care/ADL training, 02889 therapeutic exercise, 97530 therapeutic activity, and 97112 neuromuscular re-education  RECOMMENDED OTHER SERVICES: NA  CONSULTED AND AGREED WITH PLAN OF CARE: Patient and family adult nurse  PLAN FOR NEXT SESSION:  hunt Meth with opponent, dominoes  Burnard JINNY Roads, OTR/L 01/26/2024, 12:24 PM

## 2024-01-31 ENCOUNTER — Ambulatory Visit: Payer: Self-pay | Admitting: Physical Therapy

## 2024-01-31 ENCOUNTER — Ambulatory Visit: Admitting: Occupational Therapy

## 2024-01-31 DIAGNOSIS — R41844 Frontal lobe and executive function deficit: Secondary | ICD-10-CM | POA: Diagnosis not present

## 2024-01-31 DIAGNOSIS — R278 Other lack of coordination: Secondary | ICD-10-CM | POA: Diagnosis not present

## 2024-01-31 DIAGNOSIS — R41842 Visuospatial deficit: Secondary | ICD-10-CM

## 2024-01-31 DIAGNOSIS — R2689 Other abnormalities of gait and mobility: Secondary | ICD-10-CM | POA: Diagnosis not present

## 2024-01-31 DIAGNOSIS — H53462 Homonymous bilateral field defects, left side: Secondary | ICD-10-CM | POA: Diagnosis not present

## 2024-01-31 DIAGNOSIS — R41840 Attention and concentration deficit: Secondary | ICD-10-CM | POA: Diagnosis not present

## 2024-01-31 DIAGNOSIS — M6281 Muscle weakness (generalized): Secondary | ICD-10-CM | POA: Diagnosis not present

## 2024-01-31 DIAGNOSIS — R2681 Unsteadiness on feet: Secondary | ICD-10-CM | POA: Diagnosis not present

## 2024-01-31 NOTE — Therapy (Signed)
 OUTPATIENT OCCUPATIONAL THERAPY NEURO TREATMENT & DISCHARGE  Patient Name: Terry Hays MRN: [id number] DOB:12/16/1950, 73 y.o., male Today's Date: 01/31/2024  PCP: [name], MD REFERRING PROVIDER: Bulah Alm RAMAN, PA-C   OCCUPATIONAL THERAPY DISCHARGE SUMMARY  Visits from Start of Care: 20  Current functional level related to goals / functional outcomes: See below   Remaining deficits: Cognition including: attention, memory, problem solving Vision and visual perceptual skills  Balance Lt hand coordination   Education / Equipment: HEP's, visual scanning strategies and activities for home to work on for visual scanning and cognition (dominoes, memory card game, card and board games, etc)    Patient agrees to discharge. Patient goals were partially met. Patient is being discharged due to maximized rehab potential. Pt continues to need cueing for visual scanning, visual/perceptual tasks, and some cognitive tasks.      END OF SESSION:  OT End of Session - 01/31/24 1108     Visit Number 20    Number of Visits 22    Date for Recertification  02/05/24    Authorization Type [employer] P[employer]--17 visits approved 11/10/23-01/05/24, approved 8 visits from 01/05/24 - 04/04/24    Authorization - Visit Number 7    Authorization - Number of Visits 9    Progress Note Due on Visit 20    OT Start Time 1105    OT Stop Time 1145    OT Time Calculation (min) 40 min    Activity Tolerance Patient tolerated treatment well    Behavior During Therapy [facility] for tasks assessed/performed         Past Medical History:  Diagnosis Date   Abnormal ECG    Allergy    Anxiety    FLYING   Diverticulosis    Dyslipidemia    History of prostate cancer    Hyperlipidemia    Hypertension    LVH (left ventricular hypertrophy)    Pre-diabetes    Past Surgical History:  Procedure Laterality Date   COLONOSCOPY  2004 2011   MAGOD   EYE SURGERY  03/10/1999   CATOACTS BOTH    HERNIA REPAIR  03/09/1952   R INGHINAL   INGUINAL HERNIA REPAIR Left 04/07/2018   Procedure: OPERN LEFT INGUINAL HERNIA REPAIR WITH MESH ERAS PATHWAY;  Surgeon: Tanda Locus, MD;  Location: [facility];  Service: General;  Laterality: Left;   LOOP RECORDER INSERTION N/A 09/17/2023   Procedure: LOOP RECORDER INSERTION;  Surgeon: Lesia Ozell Barter, PA-C;  Location: [facility] INVASIVE CV LAB;  Service: Cardiovascular;  Laterality: N/A;   PENILE PROSTHESIS IMPLANT  03/09/2009   DAHLSTEDT   PROSTATE SURGERY  03/09/2001   PROSTRATECTOMY   TRANSESOPHAGEAL ECHOCARDIOGRAM (CATH LAB) N/A 12/13/2023   Procedure: TRANSESOPHAGEAL ECHOCARDIOGRAM;  Surgeon: Floretta Mallard, MD;  Location: [facility] INVASIVE CV LAB;  Service: Cardiovascular;  Laterality: N/A;   Patient Active Problem List   Diagnosis Date Noted   Coronary artery disease involving native coronary artery of native heart without angina pectoris 12/03/2023   History of CVA (cerebrovascular accident) 11/06/2023   Cognitive impairment 09/12/2023   Right lower lobe pulmonary nodule 09/08/2023   Attention deficit hyperactivity disorder (ADHD), predominantly hyperactive type 06/03/2021   Cherry angioma 06/03/2021   Seborrheic keratosis 06/03/2021   Arthritis 03/17/2018   Spinal stenosis of lumbosacral region 03/17/2018   Actinic keratosis 03/17/2018   Degeneration of lumbar intervertebral disc 10/27/2017   History of prostate cancer 09/15/2011   Fear of flying 09/15/2011   Family history of heart disease  in male family member before age 28 09/15/2011   Allergic rhinitis 09/15/2011   Hemorrhoids 09/15/2011   Hypertension 06/27/2010   Dyslipidemia    LVH (left ventricular hypertrophy)     ONSET DATE: late June 2025  REFERRING DIAG: Z86.73, Z92.89, H54.7  THERAPY DIAG:  Hemianopia, homonymous, left  Visuospatial deficit  Attention and concentration deficit  Other lack of coordination  Frontal lobe and executive function deficit  Rationale  for Evaluation and Treatment: Rehabilitation  SUBJECTIVE:   SUBJECTIVE STATEMENT: Sore from golf but no pain. Happy to be alive  Pt accompanied by: self   PERTINENT HISTORY: CVA 09/08/23, hx of falls  PRECAUTIONS: None  WEIGHT BEARING RESTRICTIONS: No  PAIN:  Are you having pain? No  FALLS: Has patient fallen in last 6 months? Yes. Number of falls unclear, but fell a couple weeks ago Not paying attention and has had stumbles but not falls.  LIVING ENVIRONMENT: Lives with: lives with their spouse Lives in: House/apartment Stairs: Yes: Internal: unclear but has a rail for internal steps steps; steps to enter home with rail and rail doing downstairs Has following equipment at home: Single point cane  PLOF: Independent, Independent with basic ADLs, Independent with household mobility without device, and Independent with transfers  PATIENT GOALS: Pt would like to be able use his computer again for email, typing in word, etc. Would also love to be able to golf, play pickle ball etc. Ultimate goal is to drive.  OBJECTIVE:  Note: Objective measures were completed at Evaluation unless otherwise noted.  HAND DOMINANCE: Left  ADLs: Overall ADL: Mod I at this time, using a SPC for mobility.   IADLs: Overall pt is not driving and has assist for grocery shopping, bills, cooking, IADLs at this time.  Handwriting: 90% legible  MOBILITY STATUS: Hx of falls and needs SPC  POSTURE COMMENTS:  rounded shoulders Sitting balance: decreased core strength noted but overall demonstrates functional sitting balance.   ACTIVITY TOLERANCE: Activity tolerance: decreased but functional for ADL tasks, not able to do IADL tasks such as golf and pickleball  FUNCTIONAL OUTCOME MEASURES: NA - pt truly does not feel like he has LUE deficits from CVA and wants to work on vision  UPPER EXTREMITY ROM and MMT:     Active ROM Right eval Left eval  Shoulder flexion 150 WFL  (Blank rows = not  tested)  MMT - Note mild deficits in B shoulders for shoulder flexion 2/2 old injuries from falls   HAND FUNCTION: WFL - note pt verbose and arrived late to evaluation, did not have time to asses grip strength as pt wanted to focus on vision. Will plan to assess in future sessions.   COORDINATION: Finger Nose Finger test: dysmetria and decreased coordination noted but unclear if this is from visual deficits to aim for other hand  11/25/23: 9 hole peg test: RT = 49.58 sec (slower d/t missing far Lt side and difficulty following directions even after multiple cues) Lt = 44.70 (difficulty following directions)   SENSATION: WFL  EDEMA: none  MUSCLE TONE: RUE: Within functional limits and LUE: Within functional limits  COGNITION: Overall cognitive status: Impaired and Difficulty to assess due to: Note pt does not think deficits are significant but statements from S.O. in room states that cognition has been limited for comprehension, short term memory, and higher level tasks.   VISION: Subjective report: Pt reports he cannot see anything on the left - like its not even there. States he has  learned various scanning and compensatory techniques.  Baseline vision: no glassess but demonstrates hemianopsia  Visual history: none prior to CVA   VISION ASSESSMENT: Impaired To be further assessed in functional context Eye alignment: Impaired:   Tracking/Visual pursuits: Decreased smoothness with horizontal tracking, Decreased smoothness with vertical tracking, Decreased smoothness of eye movement to Left superior field, Decreased smoothness of eye movement to Left inferior field, and Requires cues, head turns, or add eye shifts to track Visual Fields: Left homonymous hemianopsia  Patient has difficulty with following activities due to following visual impairments: driving, cooking, pickleball, golfing, has ad recent falls.   PERCEPTION: Impaired: Inattention/neglect: does not attend to left  visual field  PRAXIS: Not tested  OBSERVATIONS: The pt is a 73 yo male who is accompanied by his S.O. this date walking with a SPC who demonstrates dignificant visual deficits post CVA. Pt demonstrates slight deficits in BUE shoulder ROM but is fairly symmetrical and good MMT - does not feel like strength has been impacted from CVA from a neuro stand point. Pt with significant visual field deficits, L hemianopsia, and decreased attention to L visual field impacting skill performance.                                                                                                                             TREATMENT:    01/31/24:  Therapist assessed remaining goals - see below  Played 2 rounds of modified Yahtzee with therapist as opponent - pt required cues to remind patient of rule of game, and min cues t/o to adhere to rules, scan to see what dice he rolled and what he still needs to get, and to remember only 3 rolls is allowed to get what he needs.    01/26/24:  Pt copying parquetry designs 3 trials: first 2 trials w/ simpler design requiring min to mod cues. 3rd trial w/ complex design requiring max cues to orient pcs  Played 2 rounds of modified Yahtzee (only pt, not opponent) for coordination Lt hand, visual scanning, cognition, and memory. First round - pt required max cueing as pt was unfamiliar with game. Pt also required cues to look to Lt side and to scan for all dice, and cues to use Lt hand for rolling dice and picking up dice. Second round - pt did better w/ less cues required.  Pt could add up total number quickly with great ease at end of both rounds  Pt attempted to walk into staff office at end of session as he did not attend to Lt side for hallway to exit.   01/24/24:  Pt typing with computer typing games with prism  glasses - pt still missing words to center and slightly left of him and required cues to find word  Blaze pods for near body scanning:  - Random 1 color  taps 3-1 minute sessions hitting 22, 27, 27 target colors with avg reaction [id number] - Distraction and scanning for cognitive  component hitting 38 targets and 1 strike for longer session, avg reaction [id number]  Memory game for visual scanning, coordination, attention and visual memory with 6 pairs - mod to max cueing    01/19/24: Dice game for coordination, visual scanning, and cognition - pt required min cues initially and often did not realize he was pushing additional tiles down w/ targeted tile(s). Pt did better developing strategy w/ less rolls needed 2nd trial. However, required more rolls 3rd trial and occasional cues  Played memory game (5 pairs/matches of 5) 2 rows w/ min difficulty and cues to keep cards in same place for visual memory. 3rd trial added 6 pairs/matches of 6 (3 rows) w/ max difficulty and max cues needed.   01/13/24:  Pt arrived w/ Lt eyelid taped however tape was coming off. Pt reports he wore it last night. Therapist advised pt NOT to wear tape at night, but only during day prn. Pt also just pulled tape off w/o protecting the eyelid - pt instructed to hold eyelid w/ other hand, while gently taking off tape.   Pt copying block designs using 1 colored blocks - pt able to copy simple designs w/ mild difficulty. Pt given 4 trials getting increasingly slightly harder (but blocks still delineated in pictures) Progressed to copying large parquetry wooden block design - pt required mod cues to distinguish b/t triangles and parallelograms and also cues to develop strategies (getting all pieces out of box prior to making design). Pt also required cues to look far Lt to see all pieces. Pt has improved with correctly angling/oriented pieces during activity today  Pt played kid aged dominoes w/ initial cues but then played very well w/ only min cues to scan fully to Lt side  PATIENT EDUCATION: Education details: see above Person educated: Patient  Education method: Explanation,  Demonstration, and Verbal cues Education comprehension: verbalized understanding, returned demonstration, verbal cues required, and needs further education  HOME EXERCISE PROGRAM: 11/22/23:  Visual HEP and compensation strategies  11/25/23: coordination HEP  12/20/23: Golf Solitaire  GOALS: Goals reviewed with patient? Yes  SHORT TERM GOALS: Target date: 12/08/23  Pt will be Supervision with vision specific HEP to further integrate strategies and scanning into daily life for max recovery and benefit.  Baseline: Pt does incooperate techniques from hospital but would benefit from further training  Goal status: MET  2.  Pt will demonstrate improved visual scanning to locate 5 items in a room with no more than 2 verbal cues to locate items.  Baseline:  5 items, needing 3+ cues for scanning Goal status: MET in min distracting environment.  12/06/23  3.  Pt will demonstrate improved visual acuity and/or use of adaptations to use his personal computer for leisure activities such as email, typing in Word, etc with no more than Supervision.  Baseline: Pt reports significant difficulty with this Goal status: PARTIALLY MET w/ occasional cues (12/09/23 - Needed mod cueing to send email, use word, etc) 12/23/2023 - using phone more than computer  4.  Pt will perform a visual scanning task/worksheet of choice with no more than 2 errors in order to demonstrate improved scanning and acuity.  Goal status: MET 12/06/23 1.444M scanning sheet with 7 errors (93%)  12/22/2023: 1.444M double cancellation sheet (91% accuracy)  LONG TERM GOALS: Target date: 01/05/24  Pt will be MOD I with vision specific HEP to further integrate strategies and scanning into daily life for max recovery and benefit.  Baseline: Pt does incooperate techniques from  hospital but would benefit from further training  Goal status: PARTIALLY MET - Pt issued but doesn't always use strategies  2.  Pt will demonstrate improved visual scanning  to locate 5 items in a room with no verbal cues to locate items.  Baseline:  5 items, needing 3+ cues for scanning Goal status: MET  3.  Pt will demonstrate improved visual acuity and/or use of adaptations to use his personal computer for leisure activities such as email, typing in Word, etc with Independence. Baseline: Pt reports significant difficulty with this Goal status: PARTIALLY MET (have practiced in clinic but continues to need some assist)   4.  If safe and appropriate, pt will report a return to some leisure activities such as modified golf, pickle ball, walking, etc with use of strategies, AE, or compensatory techniques to improve safety and participation.  Baseline: not safe and not performing at this time 12/22/2023: pt able to walk to therapy appointment today Goal status: PARTIALLY MET (pt has returned to all but pickle ball d/t vision and balance)  5.  Pt will perform environmental scanning in moderately distracting gym consistently at 80% accuracy  Baseline: 01/03/24 = 80% accuracy down quiet hallway only with extra time Goal status: NOT MET (not consistent)    ASSESSMENT:  CLINICAL IMPRESSION: Pt has met STG's and some LTGs.  Pt continues to require cues to look to Lt side. Pt also continues to demo deficits in visual/perceptual skills, visual memory, and cognition. Pt is NOT safe to drive. Pt has reached max rehab potential at this time with O.T. May benefit from further O.T. in the future  PERFORMANCE DEFICITS: in functional skills including ADLs, IADLs, proprioception, endurance, and vision, cognitive skills including attention, memory, problem solving, safety awareness, and sequencing, and psychosocial skills including coping strategies, environmental adaptation, habits, and routines and behaviors.   IMPAIRMENTS: are limiting patient from ADLs, IADLs, leisure, and social participation.   CO-MORBIDITIES: has no other co-morbidities that affects occupational  performance. Patient will benefit from skilled OT to address above impairments and improve overall function.  REHAB POTENTIAL: Good  PLAN:  OT FREQUENCY: 2x/week  OT DURATION: 4 additional weeks (through end of November)  PLANNED INTERVENTIONS: 02831 OT Re-evaluation, 97535 self care/ADL training, 02889 therapeutic exercise, 97530 therapeutic activity, and 97112 neuromuscular re-education  RECOMMENDED OTHER SERVICES: NA  CONSULTED AND AGREED WITH PLAN OF CARE: Patient and family member/caregiver  PLAN   D/C O.T.   Burnard JINNY Roads, OTR/L 01/31/2024, 11:09 AM

## 2024-02-01 DIAGNOSIS — F902 Attention-deficit hyperactivity disorder, combined type: Secondary | ICD-10-CM | POA: Diagnosis not present

## 2024-02-01 DIAGNOSIS — F321 Major depressive disorder, single episode, moderate: Secondary | ICD-10-CM | POA: Diagnosis not present

## 2024-02-01 DIAGNOSIS — F1020 Alcohol dependence, uncomplicated: Secondary | ICD-10-CM | POA: Diagnosis not present

## 2024-02-05 ENCOUNTER — Encounter: Payer: Self-pay | Admitting: Internal Medicine

## 2024-02-07 ENCOUNTER — Ambulatory Visit: Payer: Self-pay | Admitting: Physical Therapy

## 2024-02-07 DIAGNOSIS — R2689 Other abnormalities of gait and mobility: Secondary | ICD-10-CM | POA: Insufficient documentation

## 2024-02-07 DIAGNOSIS — R41841 Cognitive communication deficit: Secondary | ICD-10-CM | POA: Diagnosis present

## 2024-02-07 DIAGNOSIS — R2681 Unsteadiness on feet: Secondary | ICD-10-CM | POA: Diagnosis present

## 2024-02-07 NOTE — Therapy (Unsigned)
 OUTPATIENT PHYSICAL THERAPY NEURO TREATMENT NOTE/DISCHARGE SUMMARY   Patient Name: Terry Hays MRN: [id number] DOB:[birth year], 73 y.o., male Today's Date: 02/08/2024   PCP: [name], MD  REFERRING PROVIDER: Bulah Alm RAMAN, PA-C    END OF SESSION:  PT End of Session - 02/08/24 1518     Visit Number 11    Number of Visits 12    Date for Recertification  02/11/24   from re-cert   Authorization Type [employer]-submitted auth at eval    Authorization Time Period 4 PT visits 10/13-1/11/26    Authorization - Visit Number 3    Authorization - Number of Visits 4   per renewal 01-17-24   Progress Note Due on Visit 10    PT Start Time 1105    PT Stop Time 1150    PT Time Calculation (min) 45 min    Equipment Utilized During Treatment Gait belt    Activity Tolerance Patient tolerated treatment well    Behavior During Therapy WFL for tasks assessed/performed              Past Medical History:  Diagnosis Date   Abnormal ECG    Allergy    Anxiety    FLYING   Diverticulosis    Dyslipidemia    History of prostate cancer    Hyperlipidemia    Hypertension    LVH (left ventricular hypertrophy)    Pre-diabetes    Past Surgical History:  Procedure Laterality Date   COLONOSCOPY  2004 2011   MAGOD   EYE SURGERY  03/10/1999   CATOACTS BOTH   HERNIA REPAIR  03/09/1952   R INGHINAL   INGUINAL HERNIA REPAIR Left 04/07/2018   Procedure: OPERN LEFT INGUINAL HERNIA REPAIR WITH MESH ERAS PATHWAY;  Surgeon: Tanda Locus, MD;  Location: [facility];  Service: General;  Laterality: Left;   LOOP RECORDER INSERTION N/A 09/17/2023   Procedure: LOOP RECORDER INSERTION;  Surgeon: Lesia Ozell Barter, PA-C;  Location: [facility] INVASIVE CV LAB;  Service: Cardiovascular;  Laterality: N/A;   PENILE PROSTHESIS IMPLANT  03/09/2009   DAHLSTEDT   PROSTATE SURGERY  03/09/2001   PROSTRATECTOMY   TRANSESOPHAGEAL ECHOCARDIOGRAM (CATH LAB) N/A 12/13/2023   Procedure: TRANSESOPHAGEAL  ECHOCARDIOGRAM;  Surgeon: Floretta Mallard, MD;  Location: [facility] INVASIVE CV LAB;  Service: Cardiovascular;  Laterality: N/A;   Patient Active Problem List   Diagnosis Date Noted   Coronary artery disease involving native coronary artery of native heart without angina pectoris 12/03/2023   History of CVA (cerebrovascular accident) 11/06/2023   Cognitive impairment 09/12/2023   Right lower lobe pulmonary nodule 09/08/2023   Attention deficit hyperactivity disorder (ADHD), predominantly hyperactive type 06/03/2021   Cherry angioma 06/03/2021   Seborrheic keratosis 06/03/2021   Arthritis 03/17/2018   Spinal stenosis of lumbosacral region 03/17/2018   Actinic keratosis 03/17/2018   Degeneration of lumbar intervertebral disc 10/27/2017   History of prostate cancer 09/15/2011   Fear of flying 09/15/2011   Family history of heart disease in male family member before age 50 09/15/2011   Allergic rhinitis 09/15/2011   Hemorrhoids 09/15/2011   Hypertension 06/27/2010   Dyslipidemia    LVH (left ventricular hypertrophy)     ONSET DATE: 10/20/2023 (MD referral)  REFERRING DIAG:  Z86.73 (ICD-10-CM) - History of stroke  H54.7 (ICD-10-CM) - Vision loss  Z92.89 (ICD-10-CM) - History of recent hospitalization    THERAPY DIAG:  Unsteadiness on feet  Other abnormalities of gait and mobility  Rationale for Evaluation and Treatment: Rehabilitation  SUBJECTIVE:                                                                                                                                                                                             SUBJECTIVE STATEMENT:  Pt reports he went to see Wizard of [place] play and used his cane, but reports people still bumped into him, pushing him a little; says he decided to order a blind cane to help with people not bumping into him so much. Pt reports he feels the exercises in PT have been very helpful.   Pt accompanied by: self  PERTINENT HISTORY: PMH  prior CVAs (unknown to patient), ADHD, HTN, HLD, prostate cancer, diverticulosis who presented to the ER with confusion and left visual field changes (09/2023)  PAIN:  Are you having pain? No  PRECAUTIONS: Fall and Other: decreased peripheral vision from CVA   Avoid driving  RED FLAGS: None   WEIGHT BEARING RESTRICTIONS: No  FALLS: Has patient fallen in last 6 months? Yes. Number of falls 1-R knee gave way  LIVING ENVIRONMENT: Lives with: lives with their spouse Lives in: House/apartment Stairs: steps to enter with rails; upstairs and downstairs with rail Has following equipment at home: Single point cane  PLOF: Independent and Leisure: played pickleball and golf; was working out for core strengthening and walking several miles everyday  PATIENT GOALS: To get back to playing pickleball [facility]) and golf  OBJECTIVE:  Note: Objective measures were completed at Evaluation unless otherwise noted.  DIAGNOSTIC FINDINGS: acute CVA of the right posterior cerebral artery,   COGNITION: Overall cognitive status: Within functional limits for tasks assessed   SENSATION: Light touch: WFL  COORDINATION: WFL for bilat alt taps   MUSCLE TONE: WFL  POSTURE: rounded shoulders  LOWER EXTREMITY ROM:   WFL BLEs   LOWER EXTREMITY MMT:    MMT Right Eval Left Eval  Hip flexion 4 4+  Hip extension    Hip abduction 5 5  Hip adduction 5 5  Hip internal rotation    Hip external rotation    Knee flexion 5 5  Knee extension 4 4  Ankle dorsiflexion 4 4  Ankle plantarflexion    Ankle inversion    Ankle eversion    (Blank rows = not tested)  TRANSFERS: Sit to stand: Modified independence  Assistive device utilized: None     Stand to sit: Modified independence  Assistive device utilized: None      GAIT: Findings: Gait Characteristics: veers to R, step through pattern, and wide BOS, Distance walked: 50 ft, Assistive device utilized:Single point cane, and Level of assistance:  SBA  FUNCTIONAL TESTS:  5 times sit to stand: 13.91 sec Timed up and go (TUG): NT 10 meter walk test: 13.41 veers to R= 2.44 ft/sec FGA= 16/30  M-CTSIB  Condition 1: Firm Surface, EO 30 Sec, Normal Sway  Condition 2: Firm Surface, EC 30 Sec, Normal Sway  Condition 3: Foam Surface, EO 30 Sec, Mild Sway  Condition 4: Foam Surface, EC 30 Sec, Mild and Moderate Sway                                                                                                                                TREATMENT DATE: 02-07-24  Gait: FGA:  02/07/24 0001  Functional Gait  Assessment  Gait Level Surface 3  Change in Gait Speed 3  Gait with Horizontal Head Turns 2  Gait with Vertical Head Turns 3  Gait and Pivot Turn 3  Step Over Obstacle 3  Gait with Narrow Base of Support 3   Gait with Eyes Closed 2  Ambulating Backwards 3  Steps 2  Total Score 27   Gait velocity:  12.57 secs 1st trial = 2.61 ft/sec without device   10.72 secs 2nd trial = 3.06 ft/sec without device    Self Care:  discussed LTG's (upgraded LTG's and progress); pt has attended 3/4 authorized visits in this certification period - status remains grossly same as at time of previous assessment on 01-13-24; plan D/C due to end of POC and plateau in maximizing functional status at this time. Pt reports he feels at least 50% improvement in balance recovery   TherAct: Pt performed visual scanning activity on rockerboard - read cards on Rt and Lt sides - held in various locations by this therapist for increased visual scanning on Lt side - UE support used only for recovery as needed  Pt performed amb. Sideways On blue balance beam for improved hip strategy  - retrieved Squigz per named color by PT (placed along // bar) - for improved balance on compliant surface with visual scanning - with UE support prn  PATIENT EDUCATION: Education details: Continuing HEP and scanning more for objects due to visual field deficit Person educated:  Patient Education method: Programmer, Multimedia, Demonstration, and Verbal cues Education comprehension: verbalized understanding, returned demonstration, and needs further education  HOME EXERCISE PROGRAM: Access Code: [email or web address] URL: [email or web address] Date: 12/06/2023 Prepared by: [place] Senate  Exercises - Single Leg Stance with Support  - 1 x daily - 7 x weekly - 1 sets - 2-3 reps - 10 secs hold - Tandem Stance with Chair Support  - 1 x daily - 7 x weekly - 1 sets - 2 reps - 30 secs  hold - Hays Single Leg Heel Raise  - 1 x daily - 7 x weekly - 1 sets - 10 reps - 2 secs hold - Walking with Head Rotation  - 1 x daily - 7 x weekly - 1 sets - 10 reps - Romberg  Stance Eyes Closed on Foam Pad  - 1 x daily - 5 x weekly - 3 sets - 30 hold - Romberg Stance on Foam Pad with Head Rotation  - 1 x daily - 5 x weekly - 2 sets - 10 reps  GOALS: Goals reviewed with patient? Yes  SHORT TERM GOALS: Target date: 11/19/2023  Pt will be independent with HEP for improved balance and gait. Baseline: Goal status: Not met - 1st PT appt 11-22-23  2.  Pt will improve 5x sit<>stand to less than or equal to 12 sec to demonstrate improved functional strength and transfer efficiency. Baseline: 13.41 sec; 11-22-23 - 10.22 secs without UE support from chair Goal status: Goal met 11-22-23   LONG TERM GOALS: Target date: 12/17/2023  Pt will be independent with HEP for improved balance, gait. Baseline:  Goal status: Goal met 12-20-23  2.  Pt will improve FGA score to at least 23/30 to decrease fall risk. Baseline: 16/30;  23/30 (12-20-23) Goal status: Goal met   3.  Pt will improve gait velocity to at least 2.62 ft/sec for improved gait efficiency and safety. Baseline: 2.44 ft/sec;  12-20-23:  13.63, 11.75 = 2.79 ft/sec  Goal status: Goal met 12-20-23  4.  Pt will verbalize understanding of fall prevention in home environment. Baseline:  Goal status: Goal met 12-20-23  5.  Pt will  verbalize plans for continued community fitness upon d/c from PT, to maximize gains made in PT. Baseline:  Goal status: Ongoing 12-20-23   NEW LONG TERM GOALS:  Target Date:  01-21-24    Pt will improve FGA score to at least 25/30 to decrease fall risk.    Baseline: 16/30;  23/30 (12-20-23); score 26/30    Goal status:  Goal met 01-13-24  2.  Pt will improve gait velocity to at least 3.0 ft/sec for improved gait efficiency and safety. Baseline: 2.44 ft/sec;  12-20-23:  13.63, 11.75 = 2.79 ft/sec; 10.53 secs = 3.11 ft/sec  Goal status:  Goal met 01-13-24  NEW UPDATED LONG TERM GOALS:  Target Date:  02-11-24    Pt will improve FGA score to at least 28/30 to decrease fall risk. Baseline: 16/30;  23/30 (12-20-23); score 26/30; 27/30 on 02-07-24 Goal status:  Not met 02-07-24  2.  Pt will improve gait velocity to at least 3.4 ft/sec for improved gait efficiency and safety. Baseline: 2.44 ft/sec;  12-20-23:  13.63, 11.75 = 2.79 ft/sec; 10.53 secs = 3.11 ft/sec ;  10.72, 12.57 = 3.06 ft/sec Goal status:  Not met 02-07-24  3.  Pt will subjectively report at least 50% improvement in ability to recover LOB when he stumbles.    Baseline:   Goal status:  Goal met 02-07-24    ASSESSMENT:  CLINICAL IMPRESSION: PT session focused on assessment of LTG's for discharge and also on balance activities with visual scanning incorporated.  Pt has met updated LTG #3; updated LTG's #1 and 2 not met as FGA score has increased from 26/30 on 01-13-24 to 27/30 at today's session.  Gait velocity has decreased from 3.11 ft/sec on 01-13-24 to 3.06 ft/sec in today's session.  Pt is discharged from PT due to end of POC and also due to plateau in maximizing functional status at this time.    OBJECTIVE IMPAIRMENTS: Abnormal gait, decreased balance, decreased mobility, difficulty walking, and decreased strength.   ACTIVITY LIMITATIONS: transfers and locomotion level  PARTICIPATION LIMITATIONS: driving, community  activity, and fitness/leisure activities  PERSONAL FACTORS: 3+ comorbidities: PMH  above are also affecting patient's functional outcome.   REHAB POTENTIAL: Good  CLINICAL DECISION MAKING: Stable/uncomplicated  EVALUATION COMPLEXITY: Low  PLAN:  PT FREQUENCY: 1x/week   PT DURATION: 4 weeks (per recert)  PLANNED INTERVENTIONS: 97750- Physical Performance Testing, 97110-Therapeutic exercises, 97530- Therapeutic activity, [id number]- Neuromuscular re-education, 97535- Self Care, [id number]- Gait training, Patient/Family education, and Balance training  PLAN FOR NEXT SESSION: continue balance with visual scanning activity; work on balance tasks on compliant surfaces, head motions  dynamic gait and balance - activities to incorporate visual scanning, esp. On Lt side    PHYSICAL THERAPY DISCHARGE SUMMARY  Visits from Start of Care: 11  Current functional level related to goals / functional outcomes: See above for progress towards goals    Remaining deficits: Continued decreased high level balance skills Continued Lt visual field deficit - impaired vision impacts pt's balance   Education / Equipment: Pt has been instructed in HEP for high level balance and also in activities to increase visual scanning and attention to Lt side   Patient agrees to discharge. Patient goals were partially met. Patient is being discharged due to maximized rehab potential.    Roxanna [place], PT 02/08/2024, 3:21 PM   Referring diagnosis?  Z86.73 (ICD-10-CM) - History of stroke  H54.7 (ICD-10-CM) - Vision loss  Z92.89 (ICD-10-CM) - History of recent hospitalization   Treatment diagnosis? (if different than referring diagnosis) R 26.81, R 26.89, M62.81 What was this (referring dx) caused by? []  Surgery []  Fall []  Ongoing issue []  Arthritis [x]  Other: ____________  Laterality: []  Rt []  Lt [x]  Both  Check all possible CPT codes:  *CHOOSE 10 OR LESS*    See Planned Interventions listed in the  Plan section of the Evaluation.

## 2024-02-08 ENCOUNTER — Encounter: Payer: Self-pay | Admitting: Physical Therapy

## 2024-02-08 NOTE — Progress Notes (Signed)
   02/07/24 0001  Functional Gait  Assessment  Gait Level Surface 3  Change in Gait Speed 3  Gait with Horizontal Head Turns 2  Gait with Vertical Head Turns 3  Gait and Pivot Turn 3  Step Over Obstacle 3  Gait with Narrow Base of Support 3  Gait with Eyes Closed 2  Ambulating Backwards 3  Steps 2  Total Score 27

## 2024-02-09 ENCOUNTER — Ambulatory Visit: Admitting: Speech Pathology

## 2024-02-09 DIAGNOSIS — R2681 Unsteadiness on feet: Secondary | ICD-10-CM | POA: Diagnosis not present

## 2024-02-09 DIAGNOSIS — R41841 Cognitive communication deficit: Secondary | ICD-10-CM

## 2024-02-09 NOTE — Therapy (Signed)
 OUTPATIENT SPEECH LANGUAGE PATHOLOGY TREATMENT AND RECERTIFICATION   Patient Name: Terry Hays MRN: [id number] DOB:1950/08/08, 73 y.o., male Today's Date: 02/09/2024  PCP: Joyce Rush, MD REFERRING PROVIDER: Bulah Alm RIGGERS  END OF SESSION:  End of Session - 02/09/24 1103     Visit Number 4    Number of Visits 17    Date for Recertification  04/05/24   w+8   SLP Start Time 1104    SLP Stop Time  1145    SLP Time Calculation (min) 41 min    Activity Tolerance Patient tolerated treatment well          Past Medical History:  Diagnosis Date   Abnormal ECG    Allergy    Anxiety    FLYING   Diverticulosis    Dyslipidemia    History of prostate cancer    Hyperlipidemia    Hypertension    LVH (left ventricular hypertrophy)    Pre-diabetes    Past Surgical History:  Procedure Laterality Date   COLONOSCOPY  2004 2011   MAGOD   EYE SURGERY  03/10/1999   CATOACTS BOTH   HERNIA REPAIR  03/09/1952   R INGHINAL   INGUINAL HERNIA REPAIR Left 04/07/2018   Procedure: OPERN LEFT INGUINAL HERNIA REPAIR WITH MESH ERAS PATHWAY;  Surgeon: Tanda Locus, MD;  Location: [facility];  Service: General;  Laterality: Left;   LOOP RECORDER INSERTION N/A 09/17/2023   Procedure: LOOP RECORDER INSERTION;  Surgeon: Lesia Ozell Barter, PA-C;  Location: [facility] INVASIVE CV LAB;  Service: Cardiovascular;  Laterality: N/A;   PENILE PROSTHESIS IMPLANT  03/09/2009   DAHLSTEDT   PROSTATE SURGERY  03/09/2001   PROSTRATECTOMY   TRANSESOPHAGEAL ECHOCARDIOGRAM (CATH LAB) N/A 12/13/2023   Procedure: TRANSESOPHAGEAL ECHOCARDIOGRAM;  Surgeon: Floretta Mallard, MD;  Location: [facility] INVASIVE CV LAB;  Service: Cardiovascular;  Laterality: N/A;   Patient Active Problem List   Diagnosis Date Noted   Coronary artery disease involving native coronary artery of native heart without angina pectoris 12/03/2023   History of CVA (cerebrovascular accident) 11/06/2023   Cognitive impairment 09/12/2023   Right  lower lobe pulmonary nodule 09/08/2023   Attention deficit hyperactivity disorder (ADHD), predominantly hyperactive type 06/03/2021   Cherry angioma 06/03/2021   Seborrheic keratosis 06/03/2021   Arthritis 03/17/2018   Spinal stenosis of lumbosacral region 03/17/2018   Actinic keratosis 03/17/2018   Degeneration of lumbar intervertebral disc 10/27/2017   History of prostate cancer 09/15/2011   Fear of flying 09/15/2011   Family history of heart disease in male family member before age 61 09/15/2011   Allergic rhinitis 09/15/2011   Hemorrhoids 09/15/2011   Hypertension 06/27/2010   Dyslipidemia    LVH (left ventricular hypertrophy)     ONSET DATE: 09/08/23   REFERRING DIAG: S13.26 (ICD-10-CM) - Personal history of transient ischemic attack (TIA), and cerebral infarction without residual deficits Z92.89 (ICD-10-CM) - Personal history of other medical treatment H54.7 (ICD-10-CM) - Unspecified visual loss  THERAPY DIAG:  Cognitive communication deficit  Rationale for Evaluation and Treatment: Rehabilitation  SUBJECTIVE:   SUBJECTIVE STATEMENT: I'm sorry I'm late, I had to walk here arrived 15 minutes late  Pt accompanied by: significant other - [name] attended last 10 minutes of session  PERTINENT HISTORY:  73yo male admitted 09/08/23 with confusion, left visual field cut. PMH: HTN, HLD, prediabetes, prostate cancer, diverticulosis. MRI - mod-lg (sub)acute right PCA infarct in the medial right occipital lobe. Small chronic infarcts left cerebellum.   PAIN:  Are you having pain?  No                                                                                                                            TREATMENT DATE:   02/09/24: [name] has put appointments in his phone but has not set alerts therefore his spouse is still reminding him - he will have his daughter help him set alerts both audio and visual. He continues to forget items when he leaves the house - We generated neon green  sign and [name] generated 6 items he needs to take - sign to be posted on the door.  We generated strategies of saying aloud where he sets items down to support his attention to what he is doing to reduce number of items he is losing or misplacing. [name], spouse, attended last 10 minutes of session. She reports [name] not consistently using his calendar at home -[name] states he needs to re-write as it is illegible - instructed him to add social events to the calendar as well. [name] reports [name] isn't completing tasks he needs to do. We generated strategy of using a white board to ID weekly priorities and/or daily to do lists to support his attention and recall to priority tasks.   12/15/23: [name] has put his appointments in his phone and he organized his hutch to have a clean shelf with set places for his phone, wallet, keys - he reports he is not losing these items using this strategy. Trained in energy conservation strategies setting priorities for the week and day a head - being mindful about what he wants to accomplish to spend his energy on. Compensatory strategies for attention generated including eliminating environmental distractions, having all supplies needed for a task out to avoid interruptions. Today, [name] verbalized awareness of increased word finding difficulties with mod I , indicating improved awareness - targeted moderately complex naming task with rare min A 95% accuracy. See Patient Instructions  11/10/23: Pt arrived 15 minutes late. Cognitive Function PROM completed. See above. Initiated compensatory strategy of using the calendar on his phone to recall and manage his appointments. Prior to CVA, [name] used notes in his phone, however due to increase of appointments s/p CVA, he required max A to demonstrate efficient use of this strategy. With extended time consistently, [name] added 2 OT appointments and 1 ST appointment. Instructed him to add October appointments to his phone. [name] reports inconsistently  completing OT/PT HEP - [name] is managing his rehab handouts - Initiated use of notebook with dividers for each discipline to support attention and organization for completion of HEP's and follow up with therapy recommendations. They will return with notebook.   PATIENT EDUCATION: Education details: Results thus far in evaluation, will complete eval next session, suggest psychiatrist work with pt ASAP to find attention med for him, expected sx from CVA was located. Person educated: Patient and SO Education method: Explanation Education comprehension: verbalized understanding and needs further education   GOALS: Goals  reviewed with patient? No  SHORT TERM GOALS: Target date: 01/07/24  Pt will complete CLQT in first 2 sessions Baseline: Goal status: DEFERRED - CLQT initiated at a different clinic -  2.  In cognitive linguistic written tasks pt will look to lt to complete with nonverbal cues to achieve 100% success in 3 sessions Baseline:  Goal status: MET  3.  Pt will tell SLP 2 deficit areas without cues in 2 sessions Baseline:  Goal status: MET  4.  Pt will tell SLP 3 functional memory strategies in 3 sessions Baseline:  Goal status: MET    LONG TERM GOALS: Target date: 04/06/23  Pt will improve PROM from initial administration Baseline:  Goal status: ONGOING  2.  Pt will use 2 trained memory strategies in/between three sessions Baseline:  Goal status: ONGOING  3.  Pt will complete mod complex/complex written cognitive linguistic tasks with 100% given double checking and self correction in 3 sessions Baseline:  Goal status: ONGOING   ASSESSMENT:  CLINICAL IMPRESSION: Patient is a 73 y.o. M who was seen today for assessment of cognitive linguistics after CVA 09/08/23. Pt with premorbid dx of ADHD who was NOT medicated today. Pt presents with moderate cognitive communication impairment , Left inattention who req'd cues for looking to lt in evaluation tasks as he did not do  so independently. He demonstrated memory deficits as well as awareness deficits, and also attention deficits. He is unable to take ADHD meds due to increased risk of CVA.  For more details please see cognition above. As [name] has not been seen consistently and returns today since last visit 12/15/23 and significant cognitive impairments persist, I recommend continues skilled ST  OBJECTIVE IMPAIRMENTS: include attention, memory, awareness, and receptive language. These impairments are limiting patient from return to work, managing medications, managing appointments, managing finances, household responsibilities, ADLs/IADLs, and effectively communicating at home and in community. Factors affecting potential to achieve goals and functional outcome are ability to learn/carryover information and previous level of function.. Patient will benefit from skilled SLP services to address above impairments and improve overall function.  REHAB POTENTIAL: Good  PLAN:  SLP FREQUENCY: 2x/week  SLP DURATION: 8 weeks  PLANNED INTERVENTIONS: Cueing hierachy, Cognitive reorganization, Internal/external aids, Functional tasks, SLP instruction and feedback, Compensatory strategies, Patient/family education, and [id number] Treatment of speech (30 or 45 min)     Salim Forero, Leita Caldron, CCC-SLP 02/09/2024, 12:10 PM

## 2024-02-09 NOTE — Patient Instructions (Addendum)
   Have your daughter help you set alerts for appointments in your phone  Set and write down priorities for the week on a white board, like a to do list  When you set something down - say aloud I set my cup on the mantle   Repeat to yourself why you are going to a room or what you are going to get until you get it  I'm getting my coffee, I'm getting my coffee, I'm getting my coffee.....  Go into appointments on your phone, hit edit, then set alert  Attention is the gateway to memory

## 2024-02-16 ENCOUNTER — Ambulatory Visit: Admitting: Speech Pathology

## 2024-02-16 DIAGNOSIS — R41841 Cognitive communication deficit: Secondary | ICD-10-CM

## 2024-02-16 DIAGNOSIS — R2681 Unsteadiness on feet: Secondary | ICD-10-CM | POA: Diagnosis not present

## 2024-02-16 NOTE — Patient Instructions (Addendum)
° °  You have limited energy pennies to spend since the stroke   You (and [name]) need to decide where you want to spend your pennies by setting priorities for the week and day  If you spend too many pennies, you have borrowed them from the next day - this is OK if you plan for it  Sensory stimulation (lights, noise, crowds, restaurants) take your energy pennies as well - they can take a lot of pennies - plan wisely   You look good - you will have to remind others that your brain is still healing  To help you attend to conversation when it is important:    Get the persons attention before you speak  Use eye contact and face the person you are speaking to  Be in close proximity to the person you are speaking to  Turn down any noise in the environment such as the TV, walk away from loud appliances, air conditioners, fans, dish washers etc  Repeat back what you heard Let me make sure I got what you said....   Ask for it in writing can you text me that  In large gatherings, sit or stay on the side not the center of the room  Try to sit with a wall behind you or in a corner so noise isn't coming at you from all directions when dining out or attending gatherings

## 2024-02-16 NOTE — Therapy (Signed)
 OUTPATIENT SPEECH LANGUAGE PATHOLOGY TREATMENT AND RECERTIFICATION   Patient Name: Terry Hays MRN: [id number] DOB:08-Apr-1950, 73 y.o., male Today's Date: 02/16/2024  PCP: Terry Rush, MD REFERRING PROVIDER: [name]  END OF SESSION:  End of Session - 02/16/24 1102     Visit Number 5    Number of Visits 17    Date for Recertification  04/05/24    SLP Start Time 1103    SLP Stop Time  1145    SLP Time Calculation (min) 42 min    Activity Tolerance Patient tolerated treatment well          Past Medical History:  Diagnosis Date   Abnormal ECG    Allergy    Anxiety    FLYING   Diverticulosis    Dyslipidemia    History of prostate cancer    Hyperlipidemia    Hypertension    LVH (left ventricular hypertrophy)    Pre-diabetes    Past Surgical History:  Procedure Laterality Date   COLONOSCOPY  2004 2011   MAGOD   EYE SURGERY  03/10/1999   CATOACTS BOTH   HERNIA REPAIR  03/09/1952   R INGHINAL   INGUINAL HERNIA REPAIR Left 04/07/2018   Procedure: OPERN LEFT INGUINAL HERNIA REPAIR WITH MESH ERAS PATHWAY;  Surgeon: Terry Locus, MD;  Location: [facility];  Service: General;  Laterality: Left;   LOOP RECORDER INSERTION N/A 09/17/2023   Procedure: LOOP RECORDER INSERTION;  Surgeon: Terry Ozell Barter, PA-C;  [facility] INVAS[facility] CV LAB;  Service: Cardiovascular;  Laterality: N/A;   PENILE PROSTHESIS IMPLANT  03/09/2009   DAHLSTEDT   PROSTATE SURGERY  03/09/2001   PROSTRATECTOMY   TRANSESOPHAGEAL ECHOCARDIOGRAM (CATH LAB) N/A 12/13/2023   Procedure: TRANSESOPHAGEAL ECHOCARDIOGRAM;  Surgeon: Terry Mallard, MD;  Location: [facility] INVASIVE CV LAB;  Service: Cardiovascular;  Laterality: N/A;   Patient Active Problem List   Diagnosis Date Noted   Coronary artery disease involving native coronary artery of native heart without angina pectoris 12/03/2023   History of CVA (cerebrovascular accident) 11/06/2023   Cognitive impairment 09/12/2023   Right lower  lobe pulmonary nodule 09/08/2023   Attention deficit hyperactivity disorder (ADHD), predominantly hyperactive type 06/03/2021   Cherry angioma 06/03/2021   Seborrheic keratosis 06/03/2021   Arthritis 03/17/2018   Spinal stenosis of lumbosacral region 03/17/2018   Actinic keratosis 03/17/2018   Degeneration of lumbar intervertebral disc 10/27/2017   History of prostate cancer 09/15/2011   Fear of flying 09/15/2011   Family history of heart disease in male family member before age 27 09/15/2011   Allergic rhinitis 09/15/2011   Hemorrhoids 09/15/2011   Hypertension 06/27/2010   Dyslipidemia    LVH (left ventricular hypertrophy)     ONSET DATE: 09/08/23   REFERRING DIAG: S13.26 (ICD-10-CM) - Personal history of transient ischemic attack (TIA), and cerebral infarction without residual deficits Z92.89 (ICD-10-CM) - Personal history of other medical treatment H54.7 (ICD-10-CM) - Unspecified visual loss  THERAPY DIAG:  Cognitive communication deficit  Rationale for Evaluation and Treatment: Rehabilitation  SUBJECTIVE:   SUBJECTIVE STATEMENT:   Pt accompanied by: significant other - [name] attended last 10 minutes of session  PERTINENT HISTORY:  73yo male admitted 09/08/23 with confusion, left visual field cut. PMH: HTN, HLD, prediabetes, prostate cancer, diverticulosis. MRI - mod-lg (sub)acute right PCA infarct in the medial right occipital lobe. Small chronic infarcts left cerebellum.   PAIN:  Are you having pain? No  TREATMENT DATE:   02/16/24: Terry Hays reports success posting and using a neon green sign at door with items he needs to leave the house. He has improved mindfulness of saying aloud where he sets his cane and where he sets down his phone with success. Today we targeted energy conservation to support attention and memory. With occasional min verbal he  generated 3 strategies to conserve energy. We generated strategies to support attention and auditory processing including face to face communication, limiting background noises, stop what you are doing and repeat back what you heard. Spoke with wife at end of session via phone - she has concerns with [name]'s calendar not being legible. He is to take a picture since it is large and bring it in next session.  02/09/24: Terry Hays has put appointments in his phone but has not set alerts therefore his spouse is still reminding him - he will have his daughter help him set alerts both audio and visual. He continues to forget items when he leaves the house - We generated neon green sign and Terry Hays generated 6 items he needs to take - sign to be posted on the door.  We generated strategies of saying aloud where he sets items down to support his attention to what he is doing to reduce number of items he is losing or misplacing. [name], spouse, attended last 10 minutes of session. She reports Terry Hays not consistently using his calendar at home -Terry Hays states he needs to re-write as it is illegible - instructed him to add social events to the calendar as well. [name] reports Terry Hays isn't completing tasks he needs to do. We generated strategy of using a white board to ID weekly priorities and/or daily to do lists to support his attention and recall to priority tasks.   12/15/23: Terry Hays has put his appointments in his phone and he organized his hutch to have a clean shelf with set places for his phone, wallet, keys - he reports he is not losing these items using this strategy. Trained in energy conservation strategies setting priorities for the week and day a head - being mindful about what he wants to accomplish to spend his energy on. Compensatory strategies for attention generated including eliminating environmental distractions, having all supplies needed for a task out to avoid interruptions. Today, Terry Hays verbalized awareness of increased word finding  difficulties with mod I , indicating improved awareness - targeted moderately complex naming task with rare min A 95% accuracy. See Patient Instructions  11/10/23: Pt arrived 15 minutes late. Cognitive Function PROM completed. See above. Initiated compensatory strategy of using the calendar on his phone to recall and manage his appointments. Prior to CVA, Terry Hays used notes in his phone, however due to increase of appointments s/p CVA, he required max A to demonstrate efficient use of this strategy. With extended time consistently, Terry Hays added 2 OT appointments and 1 ST appointment. Instructed him to add October appointments to his phone. Terry Hays reports inconsistently completing OT/PT HEP - [name] is managing his rehab handouts - Initiated use of notebook with dividers for each discipline to support attention and organization for completion of HEP's and follow up with therapy recommendations. They will return with notebook.   PATIENT EDUCATION: Education details: Results thus far in evaluation, will complete eval next session, suggest psychiatrist work with pt ASAP to find attention med for him, expected sx from CVA was located. Person educated: Patient and SO Education method: Explanation Education comprehension: verbalized understanding and needs further education  GOALS: Goals reviewed with patient? No  SHORT TERM GOALS: Target date: 01/07/24  Pt will complete CLQT in first 2 sessions Baseline: Goal status: DEFERRED - CLQT initiated at a different clinic -  2.  In cognitive linguistic written tasks pt will look to lt to complete with nonverbal cues to achieve 100% success in 3 sessions Baseline:  Goal status: MET  3.  Pt will tell SLP 2 deficit areas without cues in 2 sessions Baseline:  Goal status: MET  4.  Pt will tell SLP 3 functional memory strategies in 3 sessions Baseline:  Goal status: MET    LONG TERM GOALS: Target date: 04/06/23  Pt will improve PROM from initial  administration Baseline:  Goal status: ONGOING  2.  Pt will use 2 trained memory strategies in/between three sessions Baseline:  Goal status: ONGOING  3.  Pt will complete mod complex/complex written cognitive linguistic tasks with 100% given double checking and self correction in 3 sessions Baseline:  Goal status: ONGOING   ASSESSMENT:  CLINICAL IMPRESSION: Patient is a 73 y.o. M who was seen today for assessment of cognitive linguistics after CVA 09/08/23. Pt with premorbid dx of ADHD who was NOT medicated today. Pt presents with moderate cognitive communication impairment , Left inattention who req'd cues for looking to lt in evaluation tasks as he did not do so independently. He demonstrated memory deficits as well as awareness deficits, and also attention deficits. He is unable to take ADHD meds due to increased risk of CVA.  For more details please see cognition above. As Terry Hays has not been seen consistently and returns today since last visit 12/15/23 and significant cognitive impairments persist, I recommend continues skilled ST  OBJECTIVE IMPAIRMENTS: include attention, memory, awareness, and receptive language. These impairments are limiting patient from return to work, managing medications, managing appointments, managing finances, household responsibilities, ADLs/IADLs, and effectively communicating at home and in community. Factors affecting potential to achieve goals and functional outcome are ability to learn/carryover information and previous level of function.. Patient will benefit from skilled SLP services to address above impairments and improve overall function.  REHAB POTENTIAL: Good  PLAN:  SLP FREQUENCY: 2x/week  SLP DURATION: 8 weeks  PLANNED INTERVENTIONS: Cueing hierachy, Cognitive reorganization, Internal/external aids, Functional tasks, SLP instruction and feedback, Compensatory strategies, Patient/family education, and 07492 Treatment of speech (30 or 45 min)      Avry Monteleone, Leita Caldron, CCC-SLP 02/16/2024, 11:51 AM

## 2024-02-20 ENCOUNTER — Ambulatory Visit

## 2024-02-20 DIAGNOSIS — Z8673 Personal history of transient ischemic attack (TIA), and cerebral infarction without residual deficits: Secondary | ICD-10-CM | POA: Diagnosis not present

## 2024-02-20 DIAGNOSIS — I639 Cerebral infarction, unspecified: Secondary | ICD-10-CM | POA: Diagnosis not present

## 2024-02-23 ENCOUNTER — Encounter: Payer: Self-pay | Admitting: Speech Pathology

## 2024-02-23 ENCOUNTER — Ambulatory Visit: Admitting: Speech Pathology

## 2024-02-23 DIAGNOSIS — R2681 Unsteadiness on feet: Secondary | ICD-10-CM | POA: Diagnosis not present

## 2024-02-23 DIAGNOSIS — R41841 Cognitive communication deficit: Secondary | ICD-10-CM

## 2024-02-23 NOTE — Patient Instructions (Signed)
 Consider a large dry erase board calendar.   Use ultra fine dry erase markers.   Be concise and clear. Avoid long sentences.   Color coding can help with organization and clarity.   Consider writing things down on a table instead of the wall.

## 2024-02-23 NOTE — Therapy (Signed)
 OUTPATIENT SPEECH LANGUAGE PATHOLOGY TREATMENT    Patient Name: Terry Hays MRN: [id number] DOB:1950/05/08, 73 y.o., male Today's Date: 02/23/2024  PCP: Terry Rush, MD REFERRING PROVIDER: [name]  END OF SESSION:  End of Session - 02/23/24 1151     Visit Number 6    Number of Visits 17    Date for Recertification  04/05/24    SLP Start Time 1103    SLP Stop Time  1150    SLP Time Calculation (min) 47 min    Activity Tolerance Patient tolerated treatment well          Past Medical History:  Diagnosis Date   Abnormal ECG    Allergy    Anxiety    FLYING   Diverticulosis    Dyslipidemia    History of prostate cancer    Hyperlipidemia    Hypertension    LVH (left ventricular hypertrophy)    Pre-diabetes    Past Surgical History:  Procedure Laterality Date   COLONOSCOPY  2004 2011   MAGOD   EYE SURGERY  03/10/1999   CATOACTS BOTH   HERNIA REPAIR  03/09/1952   R INGHINAL   INGUINAL HERNIA REPAIR Left 04/07/2018   Procedure: OPERN LEFT INGUINAL HERNIA REPAIR WITH MESH ERAS PATHWAY;  Surgeon: Tanda Locus, MD;  Location: [facility];  Service: General;  Laterality: Left;   LOOP RECORDER INSERTION N/A 09/17/2023   Procedure: LOOP RECORDER INSERTION;  Surgeon: Lesia Ozell Barter, PA-C;  Location: [facility] INVASIVE CV LAB;  Service: Cardiovascular;  Laterality: N/A;   PENILE PROSTHESIS IMPLANT  03/09/2009   DAHLSTEDT   PROSTATE SURGERY  03/09/2001   PROSTRATECTOMY   TRANSESOPHAGEAL ECHOCARDIOGRAM (CATH LAB) N/A 12/13/2023   Procedure: TRANSESOPHAGEAL ECHOCARDIOGRAM;  Surgeon: Floretta Mallard, MD;  Location: [facility] INVASIVE CV LAB;  Service: Cardiovascular;  Laterality: N/A;   Patient Active Problem List   Diagnosis Date Noted   Coronary artery disease involving native coronary artery of native heart without angina pectoris 12/03/2023   History of CVA (cerebrovascular accident) 11/06/2023   Cognitive impairment 09/12/2023   Right lower lobe pulmonary  nodule 09/08/2023   Attention deficit hyperactivity disorder (ADHD), predominantly hyperactive type 06/03/2021   Cherry angioma 06/03/2021   Seborrheic keratosis 06/03/2021   Arthritis 03/17/2018   Spinal stenosis of lumbosacral region 03/17/2018   Actinic keratosis 03/17/2018   Degeneration of lumbar intervertebral disc 10/27/2017   History of prostate cancer 09/15/2011   Fear of flying 09/15/2011   Family history of heart disease in male family member before age 21 09/15/2011   Allergic rhinitis 09/15/2011   Hemorrhoids 09/15/2011   Hypertension 06/27/2010   Dyslipidemia    LVH (left ventricular hypertrophy)     ONSET DATE: 09/08/23   REFERRING DIAG: S13.26 (ICD-10-CM) - Personal history of transient ischemic attack (TIA), and cerebral infarction without residual deficits Z92.89 (ICD-10-CM) - Personal history of other medical treatment H54.7 (ICD-10-CM) - Unspecified visual loss  THERAPY DIAG:  Cognitive communication deficit  Rationale for Evaluation and Treatment: Rehabilitation  SUBJECTIVE:   SUBJECTIVE STATEMENT:  Pt expressed difficulty with his vision and coping with less independence. He was proud to take a train ride to/from [place] by himself.   PERTINENT HISTORY:  73yo male admitted 09/08/23 with confusion, left visual field cut. PMH: HTN, HLD, prediabetes, prostate cancer, diverticulosis. MRI - mod-lg (sub)acute right PCA infarct in the medial right occipital lobe. Small chronic infarcts left cerebellum.   PAIN:  Are you having pain? No  TREATMENT DATE:   02/23/24: Terry Hays reported feeling proud that he was able to navigate the train station and take a train ride to/from [place] independently to visit grandchildren. He also reported success with using the penny strategy which has been helpful for him and his spouse to monitor his energy output.  Patient generated multiple meanings for the same word (i.e., date, match, pinch) and sentence writing with good legibility, accurate spelling, and intact sentence structure and grammar given additional processing time. Spouse present at end of session. Both actively engaged discussion about considerations for successful use of a wall calendar at home (e.g., placement, size, color coding, organization) as memory compensation.    02/16/24: Terry Hays reports success posting and using a neon green sign at door with items he needs to leave the house. He has improved mindfulness of saying aloud where he sets his cane and where he sets down his phone with success. Today we targeted energy conservation to support attention and memory. With occasional min verbal he generated 3 strategies to conserve energy. We generated strategies to support attention and auditory processing including face to face communication, limiting background noises, stop what you are doing and repeat back what you heard. Spoke with wife at end of session via phone - she has concerns with [name]'s calendar not being legible. He is to take a picture since it is large and bring it in next session.  02/09/24: Terry Hays has put appointments in his phone but has not set alerts therefore his spouse is still reminding him - he will have his daughter help him set alerts both audio and visual. He continues to forget items when he leaves the house - We generated neon green sign and Terry Hays generated 6 items he needs to take - sign to be posted on the door.  We generated strategies of saying aloud where he sets items down to support his attention to what he is doing to reduce number of items he is losing or misplacing. Terry Hays, spouse, attended last 10 minutes of session. She reports Terry Hays not consistently using his calendar at home -Terry Hays states he needs to re-write as it is illegible - instructed him to add social events to the calendar as well. Terry Hays reports Terry Hays isn't completing  tasks he needs to do. We generated strategy of using a white board to ID weekly priorities and/or daily to do lists to support his attention and recall to priority tasks.   12/15/23: Terry Hays has put his appointments in his phone and he organized his hutch to have a clean shelf with set places for his phone, wallet, keys - he reports he is not losing these items using this strategy. Trained in energy conservation strategies setting priorities for the week and day a head - being mindful about what he wants to accomplish to spend his energy on. Compensatory strategies for attention generated including eliminating environmental distractions, having all supplies needed for a task out to avoid interruptions. Today, Terry Hays verbalized awareness of increased word finding difficulties with mod I , indicating improved awareness - targeted moderately complex naming task with rare min A 95% accuracy. See Patient Instructions  11/10/23: Pt arrived 15 minutes late. Cognitive Function PROM completed. See above. Initiated compensatory strategy of using the calendar on his phone to recall and manage his appointments. Prior to CVA, Terry Hays used notes in his phone, however due to increase of appointments s/p CVA, he required max A to demonstrate efficient use of this strategy. With extended time consistently, Terry Hays  added 2 OT appointments and 1 ST appointment. Instructed him to add October appointments to his phone. Terry Hays reports inconsistently completing OT/PT HEP - Terry Hays is managing his rehab handouts - Initiated use of notebook with dividers for each discipline to support attention and organization for completion of HEP's and follow up with therapy recommendations. They will return with notebook.   PATIENT EDUCATION: Education details: Results thus far in evaluation, will complete eval next session, suggest psychiatrist work with pt ASAP to find attention med for him, expected sx from CVA was located. Person educated: Patient and SO Education  method: Explanation Education comprehension: verbalized understanding and needs further education   GOALS: Goals reviewed with patient? No  SHORT TERM GOALS: Target date: 01/07/24  Pt will complete CLQT in first 2 sessions Baseline: Goal status: DEFERRED - CLQT initiated at a different clinic -  2.  In cognitive linguistic written tasks pt will look to lt to complete with nonverbal cues to achieve 100% success in 3 sessions Baseline:  Goal status: MET  3.  Pt will tell SLP 2 deficit areas without cues in 2 sessions Baseline:  Goal status: MET  4.  Pt will tell SLP 3 functional memory strategies in 3 sessions Baseline:  Goal status: MET    LONG TERM GOALS: Target date: 04/06/23  Pt will improve PROM from initial administration Baseline:  Goal status: ONGOING  2.  Pt will use 2 trained memory strategies in/between three sessions Baseline:  Goal status: ONGOING  3.  Pt will complete mod complex/complex written cognitive linguistic tasks with 100% given double checking and self correction in 3 sessions Baseline:  Goal status: ONGOING   ASSESSMENT:  CLINICAL IMPRESSION: Patient is a 73 y.o. M who was seen today for assessment of cognitive linguistics after CVA 09/08/23. Pt with premorbid dx of ADHD who was NOT medicated today. Pt presents with moderate cognitive communication impairment , Left inattention who req'd cues for looking to lt in evaluation tasks as he did not do so independently. He demonstrated memory deficits as well as awareness deficits, and also attention deficits. He is unable to take ADHD meds due to increased risk of CVA.  For more details please see cognition above. As Terry Hays has not been seen consistently and returns today since last visit 12/15/23 and significant cognitive impairments persist, I recommend continues skilled ST  OBJECTIVE IMPAIRMENTS: include attention, memory, awareness, and receptive language. These impairments are limiting patient from  return to work, managing medications, managing appointments, managing finances, household responsibilities, ADLs/IADLs, and effectively communicating at home and in community. Factors affecting potential to achieve goals and functional outcome are ability to learn/carryover information and previous level of function.. Patient will benefit from skilled SLP services to address above impairments and improve overall function.  REHAB POTENTIAL: Good  PLAN:  SLP FREQUENCY: 2x/week  SLP DURATION: 8 weeks  PLANNED INTERVENTIONS: Cueing hierachy, Cognitive reorganization, Internal/external aids, Functional tasks, SLP instruction and feedback, Compensatory strategies, Patient/family education, and 07492 Treatment of speech (30 or 45 min)     Rachel Rison, Leita Caldron, CCC-SLP 02/23/2024, 11:54 AM

## 2024-02-24 ENCOUNTER — Ambulatory Visit: Payer: Self-pay | Admitting: Cardiology

## 2024-02-24 LAB — CUP PACEART REMOTE DEVICE CHECK
Date Time Interrogation Session: 20251217100356
Implantable Pulse Generator Implant Date: 20250711
Pulse Gen Model: 5000
Pulse Gen Serial Number: 511069369

## 2024-02-25 ENCOUNTER — Telehealth: Payer: Self-pay

## 2024-02-25 NOTE — Progress Notes (Addendum)
" ° °  02/25/2024  Patient ID: Terry Hays, male   DOB: 10-08-1950, 73 y.o.   MRN: [id number]  Pharmacy Quality Measure Review  This patient is appearing on a report for being at risk of failing the adherence measure for hypertension (ACEi/ARB) medications this calendar year.   Medication: Losartan  Last fill date: 09/17/23 for 90 day supply  Spoke with patient, he confirms he has the medication and is still taking as prescribed. Denies any missed doses. Aware that refills are available at pharmacy when he is ready to request.  Jon VEAR Lindau, PharmD Clinical Pharmacist [phone]   "

## 2024-02-25 NOTE — Progress Notes (Signed)
 Remote Loop Recorder Transmission

## 2024-03-01 ENCOUNTER — Ambulatory Visit: Admitting: Speech Pathology

## 2024-03-01 ENCOUNTER — Encounter: Payer: Self-pay | Admitting: Speech Pathology

## 2024-03-01 DIAGNOSIS — R41841 Cognitive communication deficit: Secondary | ICD-10-CM

## 2024-03-01 DIAGNOSIS — R2681 Unsteadiness on feet: Secondary | ICD-10-CM | POA: Diagnosis not present

## 2024-03-01 NOTE — Patient Instructions (Signed)
" °  Attention is the gateway to memory -  Saying aloud where you are putting something draws are attention to it and makes you mindful of what you are doing  Great job using strategies to help you keep track of your items  A strategy for word finding is to talk around the word and say related words - what does it look like, where do we find it, what does it do, what does it remind me of  For example: Pepperoni - it's round, it's greasy, it comes from a pig, it's spicy, we get it at [employer], it's Italian....  Dorthea Sprinkle, OT [facility] -    "

## 2024-03-01 NOTE — Therapy (Signed)
 " OUTPATIENT SPEECH LANGUAGE PATHOLOGY TREATMENT    Patient Name: Terry Hays MRN: [id number] DOB:Mar 21, 1950, 73 y.o., male Today's Date: 03/01/2024  PCP: Joyce Rush, MD REFERRING PROVIDER: Bulah Alm RIGGERS  END OF SESSION:  End of Session - 03/01/24 1104     Visit Number 7    Number of Visits 17    Date for Recertification  04/05/24    SLP Start Time 1102    SLP Stop Time  1145    SLP Time Calculation (min) 43 min    Activity Tolerance Patient tolerated treatment well          Past Medical History:  Diagnosis Date   Abnormal ECG    Allergy    Anxiety    FLYING   Diverticulosis    Dyslipidemia    History of prostate cancer    Hyperlipidemia    Hypertension    LVH (left ventricular hypertrophy)    Pre-diabetes    Past Surgical History:  Procedure Laterality Date   COLONOSCOPY  2004 2011   MAGOD   EYE SURGERY  03/10/1999   CATOACTS BOTH   HERNIA REPAIR  03/09/1952   R INGHINAL   INGUINAL HERNIA REPAIR Left 04/07/2018   Procedure: OPERN LEFT INGUINAL HERNIA REPAIR WITH MESH ERAS PATHWAY;  Surgeon: Tanda Locus, MD;  Location: [facility];  Service: General;  Laterality: Left;   LOOP RECORDER INSERTION N/A 09/17/2023   Procedure: LOOP RECORDER INSERTION;  Surgeon: Lesia Ozell Barter, PA-C;  Location: [facility] INVASIVE CV LAB;  Service: Cardiovascular;  Laterality: N/A;   PENILE PROSTHESIS IMPLANT  03/09/2009   DAHLSTEDT   PROSTATE SURGERY  03/09/2001   PROSTRATECTOMY   TRANSESOPHAGEAL ECHOCARDIOGRAM (CATH LAB) N/A 12/13/2023   Procedure: TRANSESOPHAGEAL ECHOCARDIOGRAM;  Surgeon: Floretta Mallard, MD;  Location: [facility] INVASIVE CV LAB;  Service: Cardiovascular;  Laterality: N/A;   Patient Active Problem List   Diagnosis Date Noted   Coronary artery disease involving native coronary artery of native heart without angina pectoris 12/03/2023   History of CVA (cerebrovascular accident) 11/06/2023   Cognitive impairment 09/12/2023   Right lower lobe pulmonary  nodule 09/08/2023   Attention deficit hyperactivity disorder (ADHD), predominantly hyperactive type 06/03/2021   Cherry angioma 06/03/2021   Seborrheic keratosis 06/03/2021   Arthritis 03/17/2018   Spinal stenosis of lumbosacral region 03/17/2018   Actinic keratosis 03/17/2018   Degeneration of lumbar intervertebral disc 10/27/2017   History of prostate cancer 09/15/2011   Fear of flying 09/15/2011   Family history of heart disease in male family member before age 3 09/15/2011   Allergic rhinitis 09/15/2011   Hemorrhoids 09/15/2011   Hypertension 06/27/2010   Dyslipidemia    LVH (left ventricular hypertrophy)     ONSET DATE: 09/08/23   REFERRING DIAG: S13.26 (ICD-10-CM) - Personal history of transient ischemic attack (TIA), and cerebral infarction without residual deficits Z92.89 (ICD-10-CM) - Personal history of other medical treatment H54.7 (ICD-10-CM) - Unspecified visual loss  THERAPY DIAG:  Cognitive communication deficit  Rationale for Evaluation and Treatment: Rehabilitation  SUBJECTIVE:   SUBJECTIVE STATEMENT: I got the calendar but didn't fill it out   PERTINENT HISTORY:  73yo male admitted 09/08/23 with confusion, left visual field cut. PMH: HTN, HLD, prediabetes, prostate cancer, diverticulosis. MRI - mod-lg (sub)acute right PCA infarct in the medial right occipital lobe. Small chronic infarcts left cerebellum.   PAIN:  Are you having pain? No  TREATMENT DATE:   03/01/24: Terry Hays got a calendar but has not yet filled it out. He is attending private PT for vision and LE strengthening. Targeted verbal compensations for word finding  using semantic feature analysis - Terry Hays generated 3-4 salient descriptions for 7 low to mid frequency words in structured task with rare min questioning cues and visual aid of semantic feature analysis chart.   02/23/24:  Terry Hays reported feeling proud that he was able to navigate the train station and take a train ride to/from [place] independently to visit grandchildren. He also reported success with using the penny strategy which has been helpful for him and his spouse to monitor his energy output. Patient generated multiple meanings for the same word (i.e., date, match, pinch) and sentence writing with good legibility, accurate spelling, and intact sentence structure and grammar given additional processing time. Spouse present at end of session. Both actively engaged discussion about considerations for successful use of a wall calendar at home (e.g., placement, size, color coding, organization) as memory compensation.    02/16/24: Terry Hays reports success posting and using a neon green sign at door with items he needs to leave the house. He has improved mindfulness of saying aloud where he sets his cane and where he sets down his phone with success. Today we targeted energy conservation to support attention and memory. With occasional min verbal he generated 3 strategies to conserve energy. We generated strategies to support attention and auditory processing including face to face communication, limiting background noises, stop what you are doing and repeat back what you heard. Spoke with wife at end of session via phone - she has concerns with Terry Hays calendar not being legible. He is to take a picture since it is large and bring it in next session.  02/09/24: Terry Hays has put appointments in his phone but has not set alerts therefore his spouse is still reminding him - he will have his daughter help him set alerts both audio and visual. He continues to forget items when he leaves the house - We generated neon green sign and Terry Hays generated 6 items he needs to take - sign to be posted on the door.  We generated strategies of saying aloud where he sets items down to support his attention to what he is doing to reduce number of items he is  losing or misplacing. Terry Hays, spouse, attended last 10 minutes of session. She reports Terry Hays not consistently using his calendar at home -Terry Hays states he needs to re-write as it is illegible - instructed him to add social events to the calendar as well. Terry Hays reports Terry Hays isn't completing tasks he needs to do. We generated strategy of using a white board to ID weekly priorities and/or daily to do lists to support his attention and recall to priority tasks.   12/15/23: Terry Hays has put his appointments in his phone and he organized his hutch to have a clean shelf with set places for his phone, wallet, keys - he reports he is not losing these items using this strategy. Trained in energy conservation strategies setting priorities for the week and day a head - being mindful about what he wants to accomplish to spend his energy on. Compensatory strategies for attention generated including eliminating environmental distractions, having all supplies needed for a task out to avoid interruptions. Today, Terry Hays verbalized awareness of increased word finding difficulties with mod I , indicating improved awareness - targeted moderately complex naming task with rare min A 95% accuracy. See  Patient Instructions  11/10/23: Pt arrived 15 minutes late. Cognitive Function PROM completed. See above. Initiated compensatory strategy of using the calendar on his phone to recall and manage his appointments. Prior to CVA, Terry Hays used notes in his phone, however due to increase of appointments s/p CVA, he required max A to demonstrate efficient use of this strategy. With extended time consistently, Terry Hays added 2 OT appointments and 1 ST appointment. Instructed him to add October appointments to his phone. Terry Hays reports inconsistently completing OT/PT HEP - Terry Hays is managing his rehab handouts - Initiated use of notebook with dividers for each discipline to support attention and organization for completion of HEP's and follow up with therapy recommendations. They  will return with notebook.   PATIENT EDUCATION: Education details: Results thus far in evaluation, will complete eval next session, suggest psychiatrist work with pt ASAP to find attention med for him, expected sx from CVA was located. Person educated: Patient and SO Education method: Explanation Education comprehension: verbalized understanding and needs further education   GOALS: Goals reviewed with patient? No  SHORT TERM GOALS: Target date: 01/07/24  Pt will complete CLQT in first 2 sessions Baseline: Goal status: DEFERRED - CLQT initiated at a different clinic -  2.  In cognitive linguistic written tasks pt will look to lt to complete with nonverbal cues to achieve 100% success in 3 sessions Baseline:  Goal status: MET  3.  Pt will tell SLP 2 deficit areas without cues in 2 sessions Baseline:  Goal status: MET  4.  Pt will tell SLP 3 functional memory strategies in 3 sessions Baseline:  Goal status: MET    LONG TERM GOALS: Target date: 04/06/23  Pt will improve PROM from initial administration Baseline:  Goal status: ONGOING  2.  Pt will use 2 trained memory strategies in/between three sessions Baseline:  Goal status: ONGOING  3.  Pt will complete mod complex/complex written cognitive linguistic tasks with 100% given double checking and self correction in 3 sessions Baseline:  Goal status: ONGOING   ASSESSMENT:  CLINICAL IMPRESSION: Patient is a 73 y.o. M who was seen today for assessment of cognitive linguistics after CVA 09/08/23. Pt with premorbid dx of ADHD who was NOT medicated today. Pt presents with moderate cognitive communication impairment , Left inattention who req'd cues for looking to lt in evaluation tasks as he did not do so independently. He demonstrated memory deficits as well as awareness deficits, and also attention deficits. He is unable to take ADHD meds due to increased risk of CVA.  For more details please see cognition above. As Terry Hays has  not been seen consistently and returns today since last visit 12/15/23 and significant cognitive impairments persist, I recommend continues skilled ST  OBJECTIVE IMPAIRMENTS: include attention, memory, awareness, and receptive language. These impairments are limiting patient from return to work, managing medications, managing appointments, managing finances, household responsibilities, ADLs/IADLs, and effectively communicating at home and in community. Factors affecting potential to achieve goals and functional outcome are ability to learn/carryover information and previous level of function.. Patient will benefit from skilled SLP services to address above impairments and improve overall function.  REHAB POTENTIAL: Good  PLAN:  SLP FREQUENCY: 2x/week  SLP DURATION: 8 weeks  PLANNED INTERVENTIONS: Cueing hierachy, Cognitive reorganization, Internal/external aids, Functional tasks, SLP instruction and feedback, Compensatory strategies, Patient/family education, and 07492 Treatment of speech (30 or 45 min)     Gaylon Melchor, Leita Caldron, CCC-SLP 03/01/2024, 11:53 AM      "

## 2024-03-16 ENCOUNTER — Other Ambulatory Visit: Payer: Self-pay | Admitting: Family Medicine

## 2024-03-16 DIAGNOSIS — K921 Melena: Secondary | ICD-10-CM

## 2024-03-22 ENCOUNTER — Ambulatory Visit: Admitting: Speech Pathology

## 2024-03-22 ENCOUNTER — Ambulatory Visit

## 2024-03-22 DIAGNOSIS — Z8673 Personal history of transient ischemic attack (TIA), and cerebral infarction without residual deficits: Secondary | ICD-10-CM | POA: Diagnosis not present

## 2024-03-22 DIAGNOSIS — I639 Cerebral infarction, unspecified: Secondary | ICD-10-CM

## 2024-03-22 LAB — CUP PACEART REMOTE DEVICE CHECK
Date Time Interrogation Session: 20260114050224
Implantable Pulse Generator Implant Date: 20250711
Pulse Gen Model: 5000
Pulse Gen Serial Number: 511069369

## 2024-03-26 ENCOUNTER — Ambulatory Visit: Payer: Self-pay | Admitting: Cardiology

## 2024-03-29 ENCOUNTER — Ambulatory Visit: Attending: Medical | Admitting: Speech Pathology

## 2024-03-29 ENCOUNTER — Encounter: Payer: Self-pay | Admitting: Speech Pathology

## 2024-03-29 DIAGNOSIS — R41841 Cognitive communication deficit: Secondary | ICD-10-CM | POA: Diagnosis present

## 2024-03-29 NOTE — Patient Instructions (Addendum)
" ° ° °  [employer] - website  [phone]  With fatigue (end of day) word finding will get worse  Naps are a way to earn back some energy pennies  Everything takes longer, more effort, more mindfulness -   Keep up the calendar going forward - it looks great!   "

## 2024-03-29 NOTE — Therapy (Signed)
 " OUTPATIENT SPEECH LANGUAGE PATHOLOGY TREATMENT & DISCHARGE SUMMARY   Patient Name: Terry Hays MRN: [id number] DOB:05/26/1950, 74 y.o., male Today's Date: 03/29/2024  PCP: Joyce Rush, MD REFERRING PROVIDER: Bulah Alm RIGGERS  END OF SESSION:  End of Session - 03/29/24 1318     Visit Number 8    Number of Visits 17    Date for Recertification  04/05/24    SLP Start Time 1316    SLP Stop Time  1400    SLP Time Calculation (min) 44 min    Activity Tolerance Patient tolerated treatment well          Past Medical History:  Diagnosis Date   Abnormal ECG    Allergy    Anxiety    FLYING   Diverticulosis    Dyslipidemia    History of prostate cancer    Hyperlipidemia    Hypertension    LVH (left ventricular hypertrophy)    Pre-diabetes    Past Surgical History:  Procedure Laterality Date   COLONOSCOPY  2004 2011   MAGOD   EYE SURGERY  03/10/1999   CATOACTS BOTH   HERNIA REPAIR  03/09/1952   R INGHINAL   INGUINAL HERNIA REPAIR Left 04/07/2018   Procedure: OPERN LEFT INGUINAL HERNIA REPAIR WITH MESH ERAS PATHWAY;  Surgeon: Tanda Locus, MD;  Location: [facility];  Service: General;  Laterality: Left;   LOOP RECORDER INSERTION N/A 09/17/2023   Procedure: LOOP RECORDER INSERTION;  Surgeon: Lesia Ozell Barter, PA-C;  Location: [facility] INVASIVE CV LAB;  Service: Cardiovascular;  Laterality: N/A;   PENILE PROSTHESIS IMPLANT  03/09/2009   DAHLSTEDT   PROSTATE SURGERY  03/09/2001   PROSTRATECTOMY   TRANSESOPHAGEAL ECHOCARDIOGRAM (CATH LAB) N/A 12/13/2023   Procedure: TRANSESOPHAGEAL ECHOCARDIOGRAM;  Surgeon: Floretta Mallard, MD;  Location: [facility] INVASIVE CV LAB;  Service: Cardiovascular;  Laterality: N/A;   Patient Active Problem List   Diagnosis Date Noted   Coronary artery disease involving native coronary artery of native heart without angina pectoris 12/03/2023   History of CVA (cerebrovascular accident) 11/06/2023   Cognitive impairment 09/12/2023   Right lower  lobe pulmonary nodule 09/08/2023   Attention deficit hyperactivity disorder (ADHD), predominantly hyperactive type 06/03/2021   Cherry angioma 06/03/2021   Seborrheic keratosis 06/03/2021   Arthritis 03/17/2018   Spinal stenosis of lumbosacral region 03/17/2018   Actinic keratosis 03/17/2018   Degeneration of lumbar intervertebral disc 10/27/2017   History of prostate cancer 09/15/2011   Fear of flying 09/15/2011   Family history of heart disease in male family member before age 65 09/15/2011   Allergic rhinitis 09/15/2011   Hemorrhoids 09/15/2011   Hypertension 06/27/2010   Dyslipidemia    LVH (left ventricular hypertrophy)     ONSET DATE: 09/08/23   REFERRING DIAG: S13.26 (ICD-10-CM) - Personal history of transient ischemic attack (TIA), and cerebral infarction without residual deficits Z92.89 (ICD-10-CM) - Personal history of other medical treatment H54.7 (ICD-10-CM) - Unspecified visual loss  THERAPY DIAG:  Cognitive communication deficit  Rationale for Evaluation and Treatment: Rehabilitation  SUBJECTIVE:   SUBJECTIVE STATEMENT: I saw a neuropthamologist   PERTINENT HISTORY:  74yo male admitted 09/08/23 with confusion, left visual field cut. PMH: HTN, HLD, prediabetes, prostate cancer, diverticulosis. MRI - mod-lg (sub)acute right PCA infarct in the medial right occipital lobe. Small chronic infarcts left cerebellum.   PAIN:  Are you having pain? No  TREATMENT DATE:   03/29/24: Terry Hays continues to use say it in my head for where he places items to attend then recall where Items is placed. He is using large calendar - he brought in photo of his calendar - it is legible and he endorses that his spouse no longer has to remind him repeatedly of his appointments and schedule. He wrote in tx, doctor appointments as well as golf days and who is pick him up for  golf. Using calendar he verbalized dates of his next golf and PT appointment with mod [name] Terry Hays is completing moderately complex cognitive tasks independently including planning train trips to see his grandchildren, some fundraising and planning activities with grands. He reports continued success using signs near the door to remember to bring items he needs to leave the house. He continues to use energy conservation strategies with mod I. Goals met, d/c ST.   03/01/24: Terry Hays got a calendar but has not yet filled it out. He is attending private PT for vision and LE strengthening. Targeted verbal compensations for word finding  using semantic feature analysis - Terry Hays generated 3-4 salient descriptions for 7 low to mid frequency words in structured task with rare min questioning cues and visual aid of semantic feature analysis chart.   02/23/24: Terry Hays reported feeling proud that he was able to navigate the train station and take a train ride to/from [place] independently to visit grandchildren. He also reported success with using the penny strategy which has been helpful for him and his spouse to monitor his energy output. Patient generated multiple meanings for the same word (i.e., date, match, pinch) and sentence writing with good legibility, accurate spelling, and intact sentence structure and grammar given additional processing time. Spouse present at end of session. Both actively engaged discussion about considerations for successful use of a wall calendar at home (e.g., placement, size, color coding, organization) as memory compensation.    02/16/24: Terry Hays reports success posting and using a neon green sign at door with items he needs to leave the house. He has improved mindfulness of saying aloud where he sets his cane and where he sets down his phone with success. Today we targeted energy conservation to support attention and memory. With occasional min verbal he generated 3 strategies to conserve energy. We  generated strategies to support attention and auditory processing including face to face communication, limiting background noises, stop what you are doing and repeat back what you heard. Spoke with wife at end of session via phone - she has concerns with Terry Hays's calendar not being legible. He is to take a picture since it is large and bring it in next session.  02/09/24: Terry Hays has put appointments in his phone but has not set alerts therefore his spouse is still reminding him - he will have his daughter help him set alerts both audio and visual. He continues to forget items when he leaves the house - We generated neon green sign and Terry Hays generated 6 items he needs to take - sign to be posted on the door.  We generated strategies of saying aloud where he sets items down to support his attention to what he is doing to reduce number of items he is losing or misplacing. Terry Hays, spouse, attended last 10 minutes of session. She reports Terry Hays not consistently using his calendar at home -Terry Hays states he needs to re-write as it is illegible - instructed him to add social events to the calendar as well. Terry Hays reports [employer]  isn't completing tasks he needs to do. We generated strategy of using a white board to ID weekly priorities and/or daily to do lists to support his attention and recall to priority tasks.   12/15/23: Terry Hays has put his appointments in his phone and he organized his hutch to have a clean shelf with set places for his phone, wallet, keys - he reports he is not losing these items using this strategy. Trained in energy conservation strategies setting priorities for the week and day a head - being mindful about what he wants to accomplish to spend his energy on. Compensatory strategies for attention generated including eliminating environmental distractions, having all supplies needed for a task out to avoid interruptions. Today, Terry Hays verbalized awareness of increased word finding difficulties with mod I , indicating improved  awareness - targeted moderately complex naming task with rare min A 95% accuracy. See Patient Instructions  11/10/23: Pt arrived 15 minutes late. Cognitive Function PROM completed. See above. Initiated compensatory strategy of using the calendar on his phone to recall and manage his appointments. Prior to CVA, Terry Hays used notes in his phone, however due to increase of appointments s/p CVA, he required max A to demonstrate efficient use of this strategy. With extended time consistently, Terry Hays added 2 OT appointments and 1 ST appointment. Instructed him to add October appointments to his phone. Terry Hays reports inconsistently completing OT/PT HEP - Terry Hays is managing his rehab handouts - Initiated use of notebook with dividers for each discipline to support attention and organization for completion of HEP's and follow up with therapy recommendations. They will return with notebook.   PATIENT EDUCATION: Education details: Results thus far in evaluation, will complete eval next session, suggest psychiatrist work with pt ASAP to find attention med for him, expected sx from CVA was located. Person educated: Patient and SO Education method: Explanation Education comprehension: verbalized understanding and needs further education   GOALS: Goals reviewed with patient? No  SHORT TERM GOALS: Target date: 01/07/24  Pt will complete CLQT in first 2 sessions Baseline: Goal status: DEFERRED - CLQT initiated at a different clinic -  2.  In cognitive linguistic written tasks pt will look to lt to complete with nonverbal cues to achieve 100% success in 3 sessions Baseline:  Goal status: MET  3.  Pt will tell SLP 2 deficit areas without cues in 2 sessions Baseline:  Goal status: MET  4.  Pt will tell SLP 3 functional memory strategies in 3 sessions Baseline:  Goal status: MET    LONG TERM GOALS: Target date: 04/06/23  Pt will improve PROM from initial administration Baseline:  Goal status: DEFERRED  2.  Pt  will use 2 trained memory strategies in/between three sessions Baseline:  Goal status: MET  3.  Pt will complete mod complex/complex written cognitive linguistic tasks with 100% given double checking and self correction in 3 sessions Baseline:  Goal status: MET   ASSESSMENT:  CLINICAL IMPRESSION: Patient is a 74 y.o. M who was seen today for assessment of cognitive linguistics after CVA 09/08/23. Pt with premorbid dx of ADHD who was NOT medicated today. Pt has carried over multiple memory strategies, external aids, energy conservation to compensate for cognitive and word finding impairments with rare min A to mod I. His spouse is not having to remind him re: appointments and items needed to leave the house. He is losing items much less frequently. Goals met, d/c ST. Pt is is agreement.   OBJECTIVE IMPAIRMENTS: include attention, memory,  awareness, and receptive language. These impairments are limiting patient from return to work, managing medications, managing appointments, managing finances, household responsibilities, ADLs/IADLs, and effectively communicating at home and in community. Factors affecting potential to achieve goals and functional outcome are ability to learn/carryover information and previous level of function.. Patient will benefit from skilled SLP services to address above impairments and improve overall function.  REHAB POTENTIAL: Good  PLAN:  SLP FREQUENCY: 2x/week  SLP DURATION: 8 weeks  PLANNED INTERVENTIONS: Cueing hierachy, Cognitive reorganization, Internal/external aids, Functional tasks, SLP instruction and feedback, Compensatory strategies, Patient/family education, and [id number] Treatment of speech (30 or 45 min)   SPEECH THERAPY DISCHARGE SUMMARY  Visits from Start of Care: 8  Current functional level related to goals / functional outcomes: See goals above   Remaining deficits: Mild cognitive communication impairment   Education / Equipment: Compensatory  strategies for cognition and word finding as well as energy conservation   Patient agrees to discharge. Patient goals were met. Patient is being discharged due to meeting the stated rehab goals..     Cedrik Heindl Ann, CCC-SLP 03/29/2024, 2:01 PM      "

## 2024-03-29 NOTE — Progress Notes (Signed)
 Remote Loop Recorder Transmission

## 2024-04-12 DIAGNOSIS — R634 Abnormal weight loss: Secondary | ICD-10-CM | POA: Insufficient documentation

## 2024-04-12 DIAGNOSIS — C884 Extranodal marginal zone b-cell lymphoma of mucosa-associated lymphoid tissue (malt-lymphoma) not having achieved remission: Secondary | ICD-10-CM | POA: Insufficient documentation

## 2024-04-13 LAB — LAB REPORT - SCANNED: EGFR: 88

## 2024-04-14 ENCOUNTER — Other Ambulatory Visit: Payer: Self-pay | Admitting: Gastroenterology

## 2024-04-14 DIAGNOSIS — C884 Extranodal marginal zone b-cell lymphoma of mucosa-associated lymphoid tissue (malt-lymphoma) not having achieved remission: Secondary | ICD-10-CM

## 2024-04-14 DIAGNOSIS — R634 Abnormal weight loss: Secondary | ICD-10-CM

## 2024-04-22 ENCOUNTER — Encounter

## 2024-04-26 ENCOUNTER — Ambulatory Visit: Admitting: Neurology

## 2024-05-11 ENCOUNTER — Ambulatory Visit: Admitting: Neurology

## 2024-05-23 ENCOUNTER — Encounter

## 2024-06-23 ENCOUNTER — Encounter

## 2024-10-24 ENCOUNTER — Ambulatory Visit: Admitting: Neurology

## 2024-11-07 ENCOUNTER — Ambulatory Visit: Payer: Self-pay
